# Patient Record
Sex: Female | Born: 1965 | Race: Black or African American | Hispanic: No | Marital: Single | Smoking: Never smoker
Health system: Southern US, Community
[De-identification: ages and names within clinical notes are randomized; demographics above are authoritative.]

## PROBLEM LIST (undated history)

## (undated) DIAGNOSIS — I10 Essential (primary) hypertension: Secondary | ICD-10-CM

## (undated) DIAGNOSIS — R7303 Prediabetes: Secondary | ICD-10-CM

## (undated) DIAGNOSIS — F419 Anxiety disorder, unspecified: Secondary | ICD-10-CM

## (undated) HISTORY — PX: TUBAL LIGATION: SHX77

## (undated) HISTORY — PX: HAMMER TOE SURGERY: SHX385

## (undated) HISTORY — PX: ABLATION: SHX5711

---

## 1998-04-25 ENCOUNTER — Emergency Department (HOSPITAL_COMMUNITY): Admission: EM | Admit: 1998-04-25 | Discharge: 1998-04-25 | Payer: Self-pay | Admitting: Emergency Medicine

## 1999-12-27 ENCOUNTER — Ambulatory Visit (HOSPITAL_COMMUNITY): Admission: RE | Admit: 1999-12-27 | Discharge: 1999-12-27 | Payer: Self-pay | Admitting: Obstetrics and Gynecology

## 2000-02-10 ENCOUNTER — Encounter: Payer: Self-pay | Admitting: Internal Medicine

## 2000-02-10 ENCOUNTER — Encounter: Admission: RE | Admit: 2000-02-10 | Discharge: 2000-02-10 | Payer: Self-pay | Admitting: Internal Medicine

## 2001-04-07 ENCOUNTER — Encounter: Payer: Self-pay | Admitting: Internal Medicine

## 2001-04-07 ENCOUNTER — Encounter: Admission: RE | Admit: 2001-04-07 | Discharge: 2001-04-07 | Payer: Self-pay | Admitting: Internal Medicine

## 2001-04-21 ENCOUNTER — Other Ambulatory Visit: Admission: RE | Admit: 2001-04-21 | Discharge: 2001-04-21 | Payer: Self-pay | Admitting: Obstetrics and Gynecology

## 2001-04-27 ENCOUNTER — Encounter: Payer: Self-pay | Admitting: Internal Medicine

## 2001-04-27 ENCOUNTER — Encounter: Admission: RE | Admit: 2001-04-27 | Discharge: 2001-04-27 | Payer: Self-pay | Admitting: Internal Medicine

## 2002-11-03 ENCOUNTER — Other Ambulatory Visit: Admission: RE | Admit: 2002-11-03 | Discharge: 2002-11-03 | Payer: Self-pay | Admitting: Obstetrics and Gynecology

## 2004-07-15 ENCOUNTER — Encounter: Admission: RE | Admit: 2004-07-15 | Discharge: 2004-07-15 | Payer: Self-pay | Admitting: Internal Medicine

## 2004-11-04 ENCOUNTER — Encounter: Admission: RE | Admit: 2004-11-04 | Discharge: 2004-11-04 | Payer: Self-pay | Admitting: Internal Medicine

## 2004-11-14 ENCOUNTER — Encounter: Admission: RE | Admit: 2004-11-14 | Discharge: 2004-11-14 | Payer: Self-pay | Admitting: Internal Medicine

## 2006-07-29 ENCOUNTER — Encounter: Admission: RE | Admit: 2006-07-29 | Discharge: 2006-07-29 | Payer: Self-pay | Admitting: Internal Medicine

## 2008-10-06 ENCOUNTER — Emergency Department (HOSPITAL_COMMUNITY): Admission: EM | Admit: 2008-10-06 | Discharge: 2008-10-06 | Payer: Self-pay | Admitting: Emergency Medicine

## 2009-09-27 ENCOUNTER — Encounter: Admission: RE | Admit: 2009-09-27 | Discharge: 2009-09-27 | Payer: Self-pay | Admitting: Internal Medicine

## 2010-02-10 ENCOUNTER — Encounter: Payer: Self-pay | Admitting: Internal Medicine

## 2010-02-14 ENCOUNTER — Other Ambulatory Visit: Payer: Self-pay | Admitting: Internal Medicine

## 2010-02-14 DIAGNOSIS — Z1239 Encounter for other screening for malignant neoplasm of breast: Secondary | ICD-10-CM

## 2010-02-20 ENCOUNTER — Ambulatory Visit
Admission: RE | Admit: 2010-02-20 | Discharge: 2010-02-20 | Disposition: A | Discharge status: Home / Self Care | Payer: BC Managed Care – PPO | Source: Ambulatory Visit | Attending: Internal Medicine | Admitting: Internal Medicine

## 2010-02-20 DIAGNOSIS — Z1239 Encounter for other screening for malignant neoplasm of breast: Secondary | ICD-10-CM

## 2010-06-07 NOTE — Op Note (Signed)
[facility]  Patient:    Alexandra [name], Alexandra [name]                       MRN: [id number] Proc. Date: 12/27/99 Attending:  Janeece Riggers. Dareen Piano, M.D.                           Operative Report  PREOPERATIVE DIAGNOSIS:       The patient desires permanent sterilization.  POSTOPERATIVE DIAGNOSIS:      The patient desires permanent sterilization.  OPERATION:                    Bilateral tubal ligation, application of Hulka                               clips.  SURGEON:                      Mark E. Dareen Piano, M.D.  ASSISTANT:  ANESTHESIA:                   General endotracheal anesthesia.  ANTIBIOTICS:                  None.  DRAINS:                       Red rubber catheter to bladder.  ESTIMATED BLOOD LOSS:         Minimal.  COMPLICATIONS:                None.  SPECIMENS:                    None.  FINDINGS:                     The patient had normal-appearing fallopian tubes, ovaries and uterus.  DESCRIPTION OF PROCEDURE:     The patient was taken to the operating room where she was placed in the dorsal supine position. A general endotracheal anesthetic was administered without any complications.  The patient was placed in the dorsal lithotomy position and prepped with Hibiclens.  Her bladder was drained with a red rubber catheter.  Sterile speculum was placed in the vagina.  A Hulka tenaculum was applied to the anterior cervical lip.  The patient was then draped in the usual fashion for this procedure.  I injected 10 cc of 0.25% Marcaine into the umbilical region.  A vertical skin incision was made in the umbilicus and the Verres needle was placed in the peritoneal cavity.  Three liters of carbon dioxide were insufflated into the peritoneal cavity.  The 12 mm trocar was then placed in the peritoneal cavity.  The scope was then placed.  At this point, a Hulka clip was applied to the right fallopian tube in the isthmic portion. It appeared that the entire  tube was within the clasp.  The clasp appeared to be tightly closed.   The clip appeared to be perpendicular to the tube.  A similar procedure was performed on the opposite side.  At this point the scope was removed.  The pneumoperitoneum released. The trocar sheath removed.  The fascia was closed with interrupted 0 Vicryl suture, the skin incision was closed with 4-0 Vicryl in subcuticular fashion.  The patient tolerated the procedure well.  She will be  discharged to home.  She will be sent home with Tylox to take p.r.n.  She will follow up in the office in four weeks. DD:  12/27/99 TD:  12/27/99 Job: [id number]

## 2010-08-06 ENCOUNTER — Inpatient Hospital Stay (INDEPENDENT_AMBULATORY_CARE_PROVIDER_SITE_OTHER)
Admission: RE | Admit: 2010-08-06 | Discharge: 2010-08-06 | Disposition: A | Discharge status: Home / Self Care | Payer: BC Managed Care – PPO | Source: Ambulatory Visit | Attending: Emergency Medicine | Admitting: Emergency Medicine

## 2010-08-06 DIAGNOSIS — J029 Acute pharyngitis, unspecified: Secondary | ICD-10-CM

## 2010-08-06 LAB — POCT RAPID STREP A: Streptococcus, Group A Screen (Direct): NEGATIVE

## 2010-08-09 LAB — CHLAMYDIA CULTURE

## 2010-08-09 LAB — GONOCOCCUS CULTURE: Culture: NO GROWTH

## 2010-08-12 ENCOUNTER — Emergency Department (HOSPITAL_COMMUNITY)
Admission: EM | Admit: 2010-08-12 | Discharge: 2010-08-12 | Disposition: A | Discharge status: Home / Self Care | Payer: BC Managed Care – PPO | Attending: Emergency Medicine | Admitting: Emergency Medicine

## 2010-08-12 DIAGNOSIS — I10 Essential (primary) hypertension: Secondary | ICD-10-CM | POA: Insufficient documentation

## 2010-08-12 DIAGNOSIS — Z794 Long term (current) use of insulin: Secondary | ICD-10-CM | POA: Insufficient documentation

## 2010-08-12 DIAGNOSIS — F410 Panic disorder [episodic paroxysmal anxiety] without agoraphobia: Secondary | ICD-10-CM | POA: Insufficient documentation

## 2010-08-12 DIAGNOSIS — E119 Type 2 diabetes mellitus without complications: Secondary | ICD-10-CM | POA: Insufficient documentation

## 2010-08-15 LAB — MISCELLANEOUS TEST

## 2010-09-30 LAB — VIRUS CULTURE

## 2010-11-14 ENCOUNTER — Other Ambulatory Visit: Payer: Self-pay | Admitting: Internal Medicine

## 2010-11-14 DIAGNOSIS — N644 Mastodynia: Secondary | ICD-10-CM

## 2010-11-27 ENCOUNTER — Ambulatory Visit
Admission: RE | Admit: 2010-11-27 | Discharge: 2010-11-27 | Disposition: A | Discharge status: Home / Self Care | Payer: BC Managed Care – PPO | Source: Ambulatory Visit | Attending: Internal Medicine | Admitting: Internal Medicine

## 2010-11-27 DIAGNOSIS — N644 Mastodynia: Secondary | ICD-10-CM

## 2011-02-24 ENCOUNTER — Other Ambulatory Visit: Payer: Self-pay

## 2011-02-24 ENCOUNTER — Emergency Department (HOSPITAL_COMMUNITY)
Admission: EM | Admit: 2011-02-24 | Discharge: 2011-02-24 | Disposition: A | Discharge status: Home / Self Care | Payer: BC Managed Care – PPO | Attending: Emergency Medicine | Admitting: Emergency Medicine

## 2011-02-24 ENCOUNTER — Encounter (HOSPITAL_COMMUNITY): Payer: Self-pay | Admitting: Emergency Medicine

## 2011-02-24 DIAGNOSIS — F419 Anxiety disorder, unspecified: Secondary | ICD-10-CM

## 2011-02-24 DIAGNOSIS — F411 Generalized anxiety disorder: Secondary | ICD-10-CM | POA: Insufficient documentation

## 2011-02-24 DIAGNOSIS — Z79899 Other long term (current) drug therapy: Secondary | ICD-10-CM | POA: Insufficient documentation

## 2011-02-24 HISTORY — DX: Anxiety disorder, unspecified: F41.9

## 2011-02-24 HISTORY — DX: Essential (primary) hypertension: I10

## 2011-02-24 MED ORDER — LORAZEPAM 1 MG PO TABS: 1.0000 mg | ORAL_TABLET | Freq: Three times a day (TID) | ORAL | Status: AC | PRN

## 2011-02-24 MED ORDER — LORAZEPAM 1 MG PO TABS
1.0000 mg | ORAL_TABLET | Freq: Once | ORAL | Status: AC
  Administered 2011-02-24: 1 mg via ORAL
  Filled 2011-02-24 (×2): qty 1

## 2011-02-24 NOTE — ED Provider Notes (Signed)
History     CSN: [id number]  Arrival date & time 02/24/11  1330   First MD Initiated Contact with Patient 02/24/11 1517      Chief Complaint  Patient presents with  . Anxiety    (Consider location/radiation/quality/duration/timing/severity/associated sxs/prior treatment) HPI Comments: Patient has increasing stress in her life. Had some palpitations and some tingling. Has a history of similar. Prescribed Ativan in the past.  Patient is a 46 y.o. female presenting with anxiety. The history is provided by the patient. No language interpreter was used.  Anxiety This is a new problem. The current episode started 3 to 5 hours ago. The problem occurs constantly. The problem has been gradually improving. Pertinent negatives include no chest pain, no abdominal pain, no headaches and no shortness of breath. The symptoms are aggravated by stress. The symptoms are relieved by nothing. She has tried nothing for the symptoms. The treatment provided mild relief.    Past Medical History  Diagnosis Date  . Hypertension   . Anxiety     History reviewed. No pertinent past surgical history.  No family history on file.  History  Substance Use Topics  . Smoking status: Not on file  . Smokeless tobacco: Not on file  . Alcohol Use:     OB History    Grav Para Term Preterm Abortions TAB SAB Ect Mult Living                  Review of Systems  Constitutional: Negative for fever, activity change, appetite change and fatigue.  HENT: Negative for congestion, sore throat, rhinorrhea, neck pain and neck stiffness.   Respiratory: Negative for cough and shortness of breath.   Cardiovascular: Negative for chest pain and palpitations.  Gastrointestinal: Negative for nausea, vomiting and abdominal pain.  Genitourinary: Negative for dysuria, urgency, frequency and flank pain.  Neurological: Negative for dizziness, weakness, light-headedness, numbness and headaches.  Psychiatric/Behavioral: The patient  is nervous/anxious.   All other systems reviewed and are negative.    Allergies  Review of patient's allergies indicates no known allergies.  Home Medications   Current Outpatient Rx  Name Route Sig Dispense Refill  . LORAZEPAM 1 MG PO TABS Oral Take 1 tablet (1 mg total) by mouth 3 (three) times daily as needed for anxiety. 15 tablet 0    BP 155/77  Pulse 92  Temp(Src) 98.7 F (37.1 C) (Oral)  Resp 18  SpO2 98%  Physical Exam  Nursing note and vitals reviewed. Constitutional: She is oriented to person, place, and time. She appears well-developed and well-nourished. No distress.  HENT:  Head: Normocephalic and atraumatic.  Mouth/Throat: Oropharynx is clear and moist. No oropharyngeal exudate.  Eyes: Conjunctivae and EOM are normal. Pupils are equal, round, and reactive to light.  Neck: Normal range of motion. Neck supple.  Cardiovascular: Normal rate, regular rhythm, normal heart sounds and intact distal pulses.  Exam reveals no gallop and no friction rub.   No murmur heard. Pulmonary/Chest: Effort normal and breath sounds normal. No respiratory distress.  Abdominal: Soft. Bowel sounds are normal. There is no tenderness.  Musculoskeletal: Normal range of motion. She exhibits no tenderness.  Neurological: She is alert and oriented to person, place, and time. No cranial nerve deficit.  Skin: Skin is warm and dry.  Psychiatric: Her mood appears anxious. She does not exhibit a depressed mood. She expresses no homicidal and no suicidal ideation. She expresses no suicidal plans and no homicidal plans.    ED Course  Procedures (including critical care time)   Date: 02/24/2011  Rate: 65  Rhythm: normal sinus rhythm  QRS Axis: normal  Intervals: normal  ST/T Wave abnormalities: normal  Conduction Disutrbances:none  Narrative Interpretation:   Old EKG Reviewed: none available  Labs Reviewed - No data to display No results found.   1. Anxiety       MDM  Anxiety  with panic-like symptoms. I have no concern about cardiac etiology of her symptoms. EKG is unremarkable. Symptoms have since resolved. I patient has had similar symptoms in the past which prescribe Ativan. Currently taking Celexa. She has a Veterinary surgeon. She'll be discharged home with instructions to continue taking Celexa and short Rx Ativan.  Patient is not a threat to herself or others        Dayton Bailiff, MD 02/24/11 1547

## 2011-02-24 NOTE — ED Notes (Signed)
Per pt, went to work felt dizzy/anxious laid down on floor-symptoms worsened

## 2011-02-24 NOTE — ED Notes (Signed)
Panic attack 15 min prior to EMS arrival. Stress related work and family life. Prescribed Ativan and Celexa has not taken today.

## 2011-11-14 ENCOUNTER — Other Ambulatory Visit: Payer: Self-pay | Admitting: Internal Medicine

## 2011-11-19 ENCOUNTER — Other Ambulatory Visit: Payer: Self-pay | Admitting: Internal Medicine

## 2011-11-19 DIAGNOSIS — N644 Mastodynia: Secondary | ICD-10-CM

## 2011-12-02 ENCOUNTER — Ambulatory Visit
Admission: RE | Admit: 2011-12-02 | Discharge: 2011-12-02 | Disposition: A | Discharge status: Home / Self Care | Payer: BC Managed Care – PPO | Source: Ambulatory Visit | Attending: Internal Medicine | Admitting: Internal Medicine

## 2011-12-02 DIAGNOSIS — N644 Mastodynia: Secondary | ICD-10-CM

## 2012-12-17 ENCOUNTER — Other Ambulatory Visit: Payer: Self-pay

## 2012-12-17 DIAGNOSIS — Z1231 Encounter for screening mammogram for malignant neoplasm of breast: Secondary | ICD-10-CM

## 2013-01-26 ENCOUNTER — Ambulatory Visit
Admission: RE | Admit: 2013-01-26 | Discharge: 2013-01-26 | Disposition: A | Discharge status: Home / Self Care | Payer: BC Managed Care – PPO | Source: Ambulatory Visit

## 2013-01-26 DIAGNOSIS — Z1231 Encounter for screening mammogram for malignant neoplasm of breast: Secondary | ICD-10-CM

## 2013-02-01 ENCOUNTER — Other Ambulatory Visit: Payer: Self-pay | Admitting: Internal Medicine

## 2013-02-01 DIAGNOSIS — R928 Other abnormal and inconclusive findings on diagnostic imaging of breast: Secondary | ICD-10-CM

## 2013-02-11 ENCOUNTER — Ambulatory Visit
Admission: RE | Admit: 2013-02-11 | Discharge: 2013-02-11 | Disposition: A | Discharge status: Home / Self Care | Payer: BC Managed Care – PPO | Source: Ambulatory Visit | Attending: Internal Medicine | Admitting: Internal Medicine

## 2013-02-11 DIAGNOSIS — R928 Other abnormal and inconclusive findings on diagnostic imaging of breast: Secondary | ICD-10-CM

## 2014-07-17 ENCOUNTER — Other Ambulatory Visit: Payer: Self-pay | Admitting: Obstetrics and Gynecology

## 2014-07-17 DIAGNOSIS — R928 Other abnormal and inconclusive findings on diagnostic imaging of breast: Secondary | ICD-10-CM

## 2014-07-19 ENCOUNTER — Other Ambulatory Visit: Payer: Self-pay

## 2014-07-20 ENCOUNTER — Ambulatory Visit
Admission: RE | Admit: 2014-07-20 | Discharge: 2014-07-20 | Disposition: A | Discharge status: Home / Self Care | Payer: 59 | Source: Ambulatory Visit | Attending: Obstetrics and Gynecology | Admitting: Obstetrics and Gynecology

## 2014-07-20 DIAGNOSIS — R928 Other abnormal and inconclusive findings on diagnostic imaging of breast: Secondary | ICD-10-CM

## 2015-02-16 ENCOUNTER — Other Ambulatory Visit: Payer: Self-pay

## 2015-02-16 DIAGNOSIS — Z1231 Encounter for screening mammogram for malignant neoplasm of breast: Secondary | ICD-10-CM

## 2015-03-05 ENCOUNTER — Ambulatory Visit
Admission: RE | Admit: 2015-03-05 | Discharge: 2015-03-05 | Disposition: A | Discharge status: Home / Self Care | Payer: BLUE CROSS/BLUE SHIELD | Source: Ambulatory Visit

## 2015-03-05 DIAGNOSIS — Z1231 Encounter for screening mammogram for malignant neoplasm of breast: Secondary | ICD-10-CM

## 2015-07-10 ENCOUNTER — Other Ambulatory Visit: Payer: Self-pay | Admitting: Obstetrics and Gynecology

## 2015-07-10 DIAGNOSIS — Z1231 Encounter for screening mammogram for malignant neoplasm of breast: Secondary | ICD-10-CM

## 2015-07-30 ENCOUNTER — Ambulatory Visit
Admission: RE | Admit: 2015-07-30 | Discharge: 2015-07-30 | Disposition: A | Discharge status: Home / Self Care | Payer: BLUE CROSS/BLUE SHIELD | Source: Ambulatory Visit | Attending: Obstetrics and Gynecology | Admitting: Obstetrics and Gynecology

## 2015-07-30 DIAGNOSIS — Z1231 Encounter for screening mammogram for malignant neoplasm of breast: Secondary | ICD-10-CM

## 2016-06-20 ENCOUNTER — Other Ambulatory Visit: Payer: Self-pay | Admitting: Gastroenterology

## 2016-06-20 DIAGNOSIS — R110 Nausea: Secondary | ICD-10-CM

## 2016-06-20 DIAGNOSIS — R1013 Epigastric pain: Secondary | ICD-10-CM

## 2016-06-26 ENCOUNTER — Ambulatory Visit (HOSPITAL_COMMUNITY)
Admission: RE | Admit: 2016-06-26 | Discharge: 2016-06-26 | Disposition: A | Discharge status: Home / Self Care | Payer: BLUE CROSS/BLUE SHIELD | Source: Ambulatory Visit | Attending: Gastroenterology | Admitting: Gastroenterology

## 2016-06-26 DIAGNOSIS — R1013 Epigastric pain: Secondary | ICD-10-CM

## 2016-06-26 DIAGNOSIS — R110 Nausea: Secondary | ICD-10-CM

## 2016-07-09 ENCOUNTER — Ambulatory Visit (HOSPITAL_COMMUNITY): Payer: BLUE CROSS/BLUE SHIELD

## 2016-09-11 ENCOUNTER — Other Ambulatory Visit: Payer: Self-pay | Admitting: Obstetrics and Gynecology

## 2016-09-11 DIAGNOSIS — Z1231 Encounter for screening mammogram for malignant neoplasm of breast: Secondary | ICD-10-CM

## 2016-09-11 DIAGNOSIS — R5381 Other malaise: Secondary | ICD-10-CM

## 2016-09-19 ENCOUNTER — Other Ambulatory Visit: Payer: Self-pay | Admitting: Obstetrics and Gynecology

## 2016-09-19 DIAGNOSIS — N644 Mastodynia: Secondary | ICD-10-CM

## 2016-10-13 ENCOUNTER — Other Ambulatory Visit: Payer: BLUE CROSS/BLUE SHIELD

## 2016-10-14 ENCOUNTER — Ambulatory Visit
Admission: RE | Admit: 2016-10-14 | Discharge: 2016-10-14 | Disposition: A | Discharge status: Home / Self Care | Payer: BLUE CROSS/BLUE SHIELD | Source: Ambulatory Visit | Attending: Obstetrics and Gynecology | Admitting: Obstetrics and Gynecology

## 2016-10-14 DIAGNOSIS — N644 Mastodynia: Secondary | ICD-10-CM

## 2017-10-23 ENCOUNTER — Encounter: Payer: Self-pay | Admitting: Nurse Practitioner

## 2017-10-23 ENCOUNTER — Ambulatory Visit: Payer: BLUE CROSS/BLUE SHIELD | Admitting: Nurse Practitioner

## 2017-10-23 VITALS — BP 136/94 | HR 63 | Temp 98.1°F | Ht 67.0 in | Wt 197.6 lb

## 2017-10-23 DIAGNOSIS — R7303 Prediabetes: Secondary | ICD-10-CM

## 2017-10-23 DIAGNOSIS — Z23 Encounter for immunization: Secondary | ICD-10-CM

## 2017-10-23 DIAGNOSIS — G4700 Insomnia, unspecified: Secondary | ICD-10-CM

## 2017-10-23 DIAGNOSIS — I10 Essential (primary) hypertension: Secondary | ICD-10-CM | POA: Diagnosis not present

## 2017-10-23 DIAGNOSIS — R635 Abnormal weight gain: Secondary | ICD-10-CM

## 2017-10-23 MED ORDER — LIRAGLUTIDE 18 MG/3ML ~~LOC~~ SOPN: 1.2000 mg | PEN_INJECTOR | Freq: Every day | SUBCUTANEOUS | 1 refills | Status: DC

## 2017-10-23 MED ORDER — ESZOPICLONE 1 MG PO TABS: 1.0000 mg | ORAL_TABLET | Freq: Every day | ORAL | 2 refills | Status: DC

## 2017-10-23 MED ORDER — OLMESARTAN MEDOXOMIL-HCTZ 20-12.5 MG PO TABS: 0.5000 | ORAL_TABLET | Freq: Every day | ORAL | 1 refills | Status: DC

## 2017-10-23 MED ORDER — INSULIN PEN NEEDLE 32G X 4 MM MISC: 1.0000 "pen " | Freq: Every day | 3 refills | Status: DC

## 2017-10-23 NOTE — Progress Notes (Signed)
Subjective:     Patient ID: Alexandra [name] , female    DOB: [birth year] , 52 y.o.   MRN: [id number]   Hypertension  This is a chronic problem. The current episode started more than 1 year ago. The problem has been gradually worsening since onset. The problem is uncontrolled. Associated symptoms include headaches. Pertinent negatives include no anxiety, blurred vision or chest pain. There are no associated agents to hypertension. Risk factors for coronary artery disease include obesity. Treatments tried: Has been without her blood pressure medications in the last 3 months due to no insurance.  Diabetes  She presents for her follow-up diabetic visit. She has type 2 diabetes mellitus. No MedicAlert identification noted. Her disease course has been stable. Hypoglycemia symptoms include headaches. Associated symptoms include polydipsia. Pertinent negatives for diabetes include no blurred vision, no chest pain, no polyphagia and no polyuria. Risk factors for coronary artery disease include hypertension and diabetes mellitus. Current diabetic treatments: Was on Victoza until May, no insurance since then. When asked about meal planning, she reported none. She has not had a previous visit with a dietitian. An ACE inhibitor/angiotensin II receptor blocker is being taken. She does not see a podiatrist.Eye exam is not current.  Insomnia  Primary symptoms: sleep disturbance, frequent awakening.  The current episode started more than one year. The onset quality is undetermined. The problem occurs every several days. Past treatments include alcohol.     Past Medical History:  Diagnosis Date  . Anxiety   . Hypertension       Current Outpatient Medications:  .  cholecalciferol (VITAMIN D) 1000 UNITS tablet, Take 1,000 Units by mouth daily., Disp: , Rfl:  .  Insulin Pen Needle (NOVOFINE PLUS) 32G X 4 MM MISC, by Does not apply route. Use as directed, Disp: , Rfl:  .  Liraglutide (VICTOZA Perry), Inject 1.2 mg  into the skin daily., Disp: , Rfl:  .  LORazepam (ATIVAN) 0.5 MG tablet, Take 0.5 mg by mouth 2 (two) times daily as needed. For anxiety, Disp: , Rfl:  .  olmesartan-hydrochlorothiazide (BENICAR HCT) 20-12.5 MG per tablet, Take 0.5 tablets by mouth daily., Disp: , Rfl:  .  valACYclovir (VALTREX) 1000 MG tablet, Take 1,000 mg by mouth daily., Disp: , Rfl:    Review of Systems  Constitutional: Negative.   Eyes: Negative for blurred vision.  Respiratory: Negative.   Cardiovascular: Negative.  Negative for chest pain.  Endocrine: Positive for polydipsia. Negative for polyphagia and polyuria.  Musculoskeletal: Negative.   Skin: Negative.   Neurological: Positive for headaches.  Psychiatric/Behavioral: Positive for sleep disturbance. The patient has insomnia.      Today's Vitals   10/23/17 1600  BP: (!) 136/94  Pulse: 63  Temp: 98.1 F (36.7 C)  TempSrc: Oral  SpO2: 98%  Weight: 197 lb 9.6 oz (89.6 kg)  Height: 5' 7"  (1.702 m)  PainSc: 0-No pain   Body mass index is 30.95 kg/m.   Objective:  Physical Exam  Constitutional: She is oriented to person, place, and time. She appears well-developed and well-nourished.  Cardiovascular: Normal rate, regular rhythm, normal heart sounds and intact distal pulses.  Pulmonary/Chest: Effort normal and breath sounds normal.  Musculoskeletal: Normal range of motion.  Neurological: She is alert and oriented to person, place, and time.  Skin: Skin is warm and dry. Capillary refill takes less than 2 seconds.        Assessment And Plan:      1. Essential hypertension  Chronic,  slightly elevated today. She has been without her medication for approximately 3-4 months  Will restart her benazapril/HCTZ - CBC no Diff - BMP8+eGFR - Lipid Profile  2. Prediabetes  Chronic. Was taking Victoza, none in last 3-4 months due to no insurance coverage.   Advised to increase slowly by 5 clicks per week - Hemoglobin A1c - Lipid Profile  3.  Insomnia, unspecified type  Will restart Lunesta  4. Abnormal weight gain  She has gained 22lbs since her last visit  Will check for metabolic causes  She had been without her Victoza for 3-4 months which has a side effect of weight loss.  Continue exercising - TSH - T3, free - T4  5. Need for influenza vaccination  Influenza vaccine given in office  Encouraged to take Tylenol as needed for myalgias/fever - Flu Vaccine QUAD 6+ mos PF IM (Fluarix Quad PF)      [name], FNP

## 2017-10-23 NOTE — Patient Instructions (Signed)

## 2017-10-24 LAB — CBC
Hematocrit: 35.6 % (ref 34.0–46.6)
Hemoglobin: 11.9 g/dL (ref 11.1–15.9)
MCH: 29.2 pg (ref 26.6–33.0)
MCHC: 33.4 g/dL (ref 31.5–35.7)
MCV: 87 fL (ref 79–97)
Platelets: 226 10*3/uL (ref 150–450)
RBC: 4.08 x10E6/uL (ref 3.77–5.28)
RDW: 14.3 % (ref 12.3–15.4)
WBC: 7.6 10*3/uL (ref 3.4–10.8)

## 2017-10-24 LAB — BMP8+EGFR
BUN/Creatinine Ratio: 23 (ref 9–23)
BUN: 22 mg/dL (ref 6–24)
CO2: 23 mmol/L (ref 20–29)
Calcium: 9.2 mg/dL (ref 8.7–10.2)
Chloride: 106 mmol/L (ref 96–106)
Creatinine, Ser: 0.94 mg/dL (ref 0.57–1.00)
GFR calc Af Amer: 80 mL/min/{1.73_m2} (ref >59)
GFR calc non Af Amer: 69 mL/min/{1.73_m2} (ref >59)
Glucose: 91 mg/dL (ref 65–99)
Potassium: 4.2 mmol/L (ref 3.5–5.2)
Sodium: 146 mmol/L — ABNORMAL HIGH (ref 134–144)

## 2017-10-24 LAB — HEMOGLOBIN A1C
Est. average glucose Bld gHb Est-mCnc: 117 mg/dL
Hgb A1c MFr Bld: 5.7 % — ABNORMAL HIGH (ref 4.8–5.6)

## 2017-10-24 LAB — LIPID PANEL
Chol/HDL Ratio: 3 ratio (ref 0.0–4.4)
Cholesterol, Total: 198 mg/dL (ref 100–199)
HDL: 65 mg/dL (ref >39)
LDL Calculated: 112 mg/dL — ABNORMAL HIGH (ref 0–99)
Triglycerides: 103 mg/dL (ref 0–149)
VLDL Cholesterol Cal: 21 mg/dL (ref 5–40)

## 2017-10-29 ENCOUNTER — Other Ambulatory Visit: Payer: Self-pay | Admitting: Nurse Practitioner

## 2017-10-29 DIAGNOSIS — R7303 Prediabetes: Secondary | ICD-10-CM

## 2017-10-29 MED ORDER — SEMAGLUTIDE(0.25 OR 0.5MG/DOS) 2 MG/1.5ML ~~LOC~~ SOPN: 0.5000 mg | PEN_INJECTOR | SUBCUTANEOUS | 3 refills | Status: DC

## 2017-12-30 ENCOUNTER — Other Ambulatory Visit: Payer: Self-pay | Admitting: Nurse Practitioner

## 2018-01-22 ENCOUNTER — Encounter: Payer: Self-pay | Admitting: Nurse Practitioner

## 2018-01-22 ENCOUNTER — Ambulatory Visit (INDEPENDENT_AMBULATORY_CARE_PROVIDER_SITE_OTHER): Payer: BLUE CROSS/BLUE SHIELD | Admitting: Nurse Practitioner

## 2018-01-22 VITALS — BP 110/72 | HR 69 | Temp 98.2°F | Ht 67.0 in | Wt 192.6 lb

## 2018-01-22 DIAGNOSIS — Z1211 Encounter for screening for malignant neoplasm of colon: Secondary | ICD-10-CM | POA: Diagnosis not present

## 2018-01-22 DIAGNOSIS — I10 Essential (primary) hypertension: Secondary | ICD-10-CM

## 2018-01-22 DIAGNOSIS — M25512 Pain in left shoulder: Secondary | ICD-10-CM

## 2018-01-22 DIAGNOSIS — G8929 Other chronic pain: Secondary | ICD-10-CM

## 2018-01-22 DIAGNOSIS — Z1231 Encounter for screening mammogram for malignant neoplasm of breast: Secondary | ICD-10-CM | POA: Diagnosis not present

## 2018-01-22 DIAGNOSIS — R7303 Prediabetes: Secondary | ICD-10-CM

## 2018-01-22 DIAGNOSIS — Z0000 Encounter for general adult medical examination without abnormal findings: Secondary | ICD-10-CM

## 2018-01-22 DIAGNOSIS — M25511 Pain in right shoulder: Secondary | ICD-10-CM

## 2018-01-22 LAB — POCT URINALYSIS DIPSTICK
Bilirubin, UA: NEGATIVE
Glucose, UA: NEGATIVE
Ketones, UA: NEGATIVE
Leukocytes, UA: NEGATIVE
Nitrite, UA: NEGATIVE
Protein, UA: NEGATIVE
Spec Grav, UA: 1.015 (ref 1.010–1.025)
Urobilinogen, UA: 0.2 E.U./dL
pH, UA: 6.5 (ref 5.0–8.0)

## 2018-01-22 LAB — POCT UA - MICROALBUMIN
Creatinine, POC: 100 mg/dL
Microalbumin Ur, POC: 80 mg/L

## 2018-01-22 NOTE — Patient Instructions (Signed)

## 2018-01-22 NOTE — Progress Notes (Signed)
Subjective:     Patient ID: Alexandra [name] , female    DOB: Jun 08, 1965 , 53 y.o.   MRN: [id number]   Chief Complaint  Patient presents with  . Annual Exam   The patient states she uses post menopausal status for birth control. Last LMP was Patient's last menstrual period was 07/09/2015.. Negative for Dysmenorrhea and Negative for Menorrhagia Mammogram last done 10/14/16 Negative for: breast discharge, breast lump(s), breast pain and breast self exam.  Pertinent negatives include abnormal bleeding (hematology), anxiety, decreased libido, depression, difficulty falling sleep, dyspareunia, history of infertility, nocturia, sexual dysfunction, sleep disturbances, urinary incontinence, urinary urgency, vaginal discharge and vaginal itching. Diet regular, avoiding fatty foods.The patient states her exercise level is  none.    . The patient's tobacco use is:  Social History   Tobacco Use  Smoking Status Never Smoker  Smokeless Tobacco Never Used  . She has been exposed to passive smoke. The patient's alcohol use is:  Social History   Substance and Sexual Activity  Alcohol Use Yes  . Additional information: Last pap November 2019 (Dr. Orene Desanctis), next one scheduled for 2022   HPI  Here for HM  Diabetes  She presents for her follow-up diabetic visit. She has type 2 diabetes mellitus. Her disease course has been stable. There are no hypoglycemic associated symptoms. Pertinent negatives for hypoglycemia include no dizziness or headaches. Pertinent negatives for diabetes include no blurred vision, no polydipsia, no polyphagia and no polyuria. There are no hypoglycemic complications. There are no diabetic complications. Risk factors for coronary artery disease include diabetes mellitus, obesity and sedentary lifestyle. Current diabetic treatment includes oral agent (monotherapy). She is compliant with treatment all of the time. When asked about meal planning, she reported none. She has not had a previous  visit with a dietitian. An ACE inhibitor/angiotensin II receptor blocker is being taken. She does not see a podiatrist.Eye exam is current.     Past Medical History:  Diagnosis Date  . Anxiety   . Hypertension      Family History  Problem Relation Age of Onset  . Breast cancer Maternal Grandmother      Current Outpatient Medications:  .  benazepril-hydrochlorthiazide (LOTENSIN HCT) 20-12.5 MG tablet, TAKE 1 TABLET BY MOUTH EVERY DAY, Disp: 30 tablet, Rfl: 2 .  cholecalciferol (VITAMIN D) 1000 UNITS tablet, Take 1,000 Units by mouth daily., Disp: , Rfl:  .  eszopiclone (LUNESTA) 1 MG TABS tablet, Take 1 tablet (1 mg total) by mouth at bedtime. Take immediately before bedtime, Disp: 30 tablet, Rfl: 2 .  Insulin Pen Needle (NOVOFINE PLUS) 32G X 4 MM MISC, Inject 1 pen into the skin daily. Inject 1 pen with Victoza SQ, Disp: 100 each, Rfl: 3 .  Semaglutide,0.25 or 0.5MG /DOS, (OZEMPIC, 0.25 OR 0.5 MG/DOSE,) 2 MG/1.5ML SOPN, Inject 0.5 mg into the skin once a week. Inject 0.25 mg weekly to skin for 4 weeks then increase to 0.5 mg weekly to skin, Disp: 1 pen, Rfl: 3 .  valACYclovir (VALTREX) 1000 MG tablet, Take 1,000 mg by mouth daily. Patient taking it as needed, Disp: , Rfl:    No Known Allergies   Review of Systems  Constitutional: Negative.   HENT: Negative.   Eyes: Negative.  Negative for blurred vision.  Respiratory: Negative.   Cardiovascular: Negative.   Gastrointestinal: Negative.   Endocrine: Negative.  Negative for polydipsia, polyphagia and polyuria.  Genitourinary: Negative.   Musculoskeletal: Negative.   Skin: Negative.   Allergic/Immunologic: Negative.  Neurological: Negative.  Negative for dizziness and headaches.  Hematological: Negative.   Psychiatric/Behavioral: Negative.      Today's Vitals   01/22/18 0905  BP: 110/72  Pulse: 69  Temp: 98.2 F (36.8 C)  SpO2: 96%  Weight: 192 lb 9.6 oz (87.4 kg)  Height: 5\' 7"  (1.702 m)  PainSc: 0-No pain   Body  mass index is 30.17 kg/m.   Objective:  Physical Exam Constitutional:      Appearance: Normal appearance. She is well-developed.  HENT:     Head: Normocephalic and atraumatic.     Right Ear: Hearing, tympanic membrane, ear canal and external ear normal.     Left Ear: Hearing, tympanic membrane, ear canal and external ear normal.     Nose: Nose normal.     Mouth/Throat:     Mouth: Mucous membranes are moist.  Eyes:     General: Lids are normal.     Extraocular Movements: Extraocular movements intact.     Conjunctiva/sclera: Conjunctivae normal.     Pupils: Pupils are equal, round, and reactive to light.     Funduscopic exam:    Right eye: No papilledema.        Left eye: No papilledema.  Neck:     Musculoskeletal: Full passive range of motion without pain, normal range of motion and neck supple.     Thyroid: No thyroid mass.     Vascular: No carotid bruit.  Cardiovascular:     Rate and Rhythm: Normal rate and regular rhythm.     Pulses: Normal pulses.     Heart sounds: Normal heart sounds. No murmur.  Pulmonary:     Effort: Pulmonary effort is normal.     Breath sounds: Normal breath sounds.  Abdominal:     General: Bowel sounds are normal.     Palpations: Abdomen is soft.     Tenderness: There is no abdominal tenderness.  Musculoskeletal: Normal range of motion.  Skin:    General: Skin is warm and dry.     Capillary Refill: Capillary refill takes less than 2 seconds.  Neurological:     General: No focal deficit present.     Mental Status: She is alert and oriented to person, place, and time.     Cranial Nerves: No cranial nerve deficit.     Sensory: No sensory deficit.  Psychiatric:        Mood and Affect: Mood normal.        Behavior: Behavior normal.        Thought Content: Thought content normal.        Judgment: Judgment normal.        Assessment And Plan:     1. Routine general medical examination at a health care facility . Behavior modifications  discussed and diet history reviewed.   . Pt will continue to exercise regularly and modify diet with low GI, plant based foods and decrease intake of processed foods.  . Recommend intake of daily multivitamin, Vitamin D, and calcium.  . Recommend mammogram for preventive screenings  2. Encounter for screening mammogram for breast cancer  Pt instructed on Self Breast Exam.According to ACOG guidelines Women aged 29 and older are recommended to get an annual mammogram. Form completed and given to patient contact the [facility] for appointment scheduing.   Pt encouraged to get annual mammogram  3. Essential hypertension . B/P is controlled.  . CMP ordered to check renal function.  . The importance of regular exercise  and dietary modification was stressed to the patient.  . Stressed importance of losing ten percent of her body weight to help with B/P control.  . The weight loss would help with decreasing cardiac and cancer risk as well.  - EKG 12-Lead - POCT Urinalysis Dipstick (81002) - POCT UA - Microalbumin  4. Prediabetes  Chronic, stable  Continue with current medications, she is doing well on Ozempic  Encouraged to limit intake of sugary foods and drinks  Encouraged to increase physical activity to 150 minutes per week  5. Chronic pain of both shoulders  Muscle tension  Encouraged to use heating pad and consider massage therapy to loosen shoulder muscles   [name], FNP

## 2018-01-23 LAB — LIPID PANEL
Chol/HDL Ratio: 4.1 ratio (ref 0.0–4.4)
Cholesterol, Total: 253 mg/dL — ABNORMAL HIGH (ref 100–199)
HDL: 62 mg/dL (ref >39)
LDL Calculated: 175 mg/dL — ABNORMAL HIGH (ref 0–99)
Triglycerides: 80 mg/dL (ref 0–149)
VLDL Cholesterol Cal: 16 mg/dL (ref 5–40)

## 2018-01-23 LAB — CMP14 + ANION GAP
ALT: 10 IU/L (ref 0–32)
AST: 13 IU/L (ref 0–40)
Albumin/Globulin Ratio: 2.4 — ABNORMAL HIGH (ref 1.2–2.2)
Albumin: 4.7 g/dL (ref 3.5–5.5)
Alkaline Phosphatase: 58 IU/L (ref 39–117)
Anion Gap: 19 mmol/L — ABNORMAL HIGH (ref 10.0–18.0)
BUN/Creatinine Ratio: 15 (ref 9–23)
BUN: 13 mg/dL (ref 6–24)
Bilirubin Total: 0.7 mg/dL (ref 0.0–1.2)
CO2: 22 mmol/L (ref 20–29)
Calcium: 9.7 mg/dL (ref 8.7–10.2)
Chloride: 102 mmol/L (ref 96–106)
Creatinine, Ser: 0.85 mg/dL (ref 0.57–1.00)
GFR calc Af Amer: 90 mL/min/{1.73_m2} (ref >59)
GFR calc non Af Amer: 78 mL/min/{1.73_m2} (ref >59)
Globulin, Total: 2 g/dL (ref 1.5–4.5)
Glucose: 87 mg/dL (ref 65–99)
Potassium: 4.4 mmol/L (ref 3.5–5.2)
Sodium: 143 mmol/L (ref 134–144)
Total Protein: 6.7 g/dL (ref 6.0–8.5)

## 2018-01-23 LAB — HEMOGLOBIN A1C
Est. average glucose Bld gHb Est-mCnc: 108 mg/dL
Hgb A1c MFr Bld: 5.4 % (ref 4.8–5.6)

## 2018-01-23 LAB — TSH: TSH: 1.01 u[IU]/mL (ref 0.450–4.500)

## 2018-01-28 ENCOUNTER — Other Ambulatory Visit: Payer: Self-pay | Admitting: Nurse Practitioner

## 2018-01-28 DIAGNOSIS — Z1231 Encounter for screening mammogram for malignant neoplasm of breast: Secondary | ICD-10-CM

## 2018-02-10 ENCOUNTER — Ambulatory Visit: Payer: BLUE CROSS/BLUE SHIELD | Admitting: Nurse Practitioner

## 2018-02-10 ENCOUNTER — Encounter: Payer: Self-pay | Admitting: Nurse Practitioner

## 2018-02-10 VITALS — BP 110/84 | HR 64 | Temp 98.3°F | Ht 66.0 in | Wt 192.4 lb

## 2018-02-10 DIAGNOSIS — E7800 Pure hypercholesterolemia, unspecified: Secondary | ICD-10-CM | POA: Diagnosis not present

## 2018-02-10 DIAGNOSIS — Z79899 Other long term (current) drug therapy: Secondary | ICD-10-CM | POA: Diagnosis not present

## 2018-02-10 DIAGNOSIS — R1031 Right lower quadrant pain: Secondary | ICD-10-CM | POA: Insufficient documentation

## 2018-02-10 MED ORDER — ATORVASTATIN CALCIUM 10 MG PO TABS: 10.0000 mg | ORAL_TABLET | Freq: Every day | ORAL | 3 refills | Status: DC

## 2018-02-10 NOTE — Progress Notes (Signed)
Subjective:     Patient ID: Alexandra [name] , female    DOB: 11-13-1965 , 53 y.o.   MRN: [id number]   Chief Complaint  Patient presents with  . Hip Pain    Patient states she has been having these symptoms for the past month and it has gotten worse.    HPI  She has a GYN - Alexandra [name] or Alexandra [name]. Postmenopausal.  Abdominal Pain  This is a new problem. The current episode started 1 to 4 weeks ago. The onset quality is gradual. The problem occurs constantly. The problem has been gradually worsening. The pain is located in the RLQ. The pain is at a severity of 6/10. The quality of the pain is aching. The abdominal pain does not radiate. Pertinent negatives include no constipation, diarrhea, fever, headaches or nausea. Nothing aggravates the pain. The pain is relieved by nothing. She has tried nothing for the symptoms.     Past Medical History:  Diagnosis Date  . Anxiety   . Hypertension      Family History  Problem Relation Age of Onset  . Breast cancer Maternal Grandmother      Current Outpatient Medications:  .  benazepril-hydrochlorthiazide (LOTENSIN HCT) 20-12.5 MG tablet, TAKE 1 TABLET BY MOUTH EVERY DAY, Disp: 30 tablet, Rfl: 2 .  cholecalciferol (VITAMIN D) 1000 UNITS tablet, Take 1,000 Units by mouth daily., Disp: , Rfl:  .  eszopiclone (LUNESTA) 1 MG TABS tablet, Take 1 tablet (1 mg total) by mouth at bedtime. Take immediately before bedtime, Disp: 30 tablet, Rfl: 2 .  Insulin Pen Needle (NOVOFINE PLUS) 32G X 4 MM MISC, Inject 1 pen into the skin daily. Inject 1 pen with Victoza SQ, Disp: 100 each, Rfl: 3 .  Semaglutide,0.25 or 0.5MG /DOS, (OZEMPIC, 0.25 OR 0.5 MG/DOSE,) 2 MG/1.5ML SOPN, Inject 0.5 mg into the skin once a week. Inject 0.25 mg weekly to skin for 4 weeks then increase to 0.5 mg weekly to skin, Disp: 1 pen, Rfl: 3 .  valACYclovir (VALTREX) 1000 MG tablet, Take 1,000 mg by mouth daily. Patient taking it as needed, Disp: , Rfl:    No Known Allergies   Review  of Systems  Constitutional: Negative for chills and fever.  HENT: Negative for sore throat.   Respiratory: Positive for chest tightness. Negative for cough and wheezing.   Cardiovascular: Negative for palpitations and leg swelling.  Gastrointestinal: Positive for abdominal pain. Negative for constipation, diarrhea and nausea.  Endocrine: Negative for polydipsia, polyphagia and polyuria.  Genitourinary: Negative for difficulty urinating.  Neurological: Negative for dizziness and headaches.     Today's Vitals   02/10/18 1158  BP: 110/84  Pulse: 64  Temp: 98.3 F (36.8 C)  TempSrc: Oral  SpO2: 98%  Weight: 192 lb 6.4 oz (87.3 kg)  Height: 5\' 6"  (1.676 m)  PainSc: 6   PainLoc: Hip   Body mass index is 31.05 kg/m.   Objective:  Physical Exam Vitals signs reviewed.  Constitutional:      Appearance: Normal appearance.  HENT:     Head: Normocephalic and atraumatic.     Nose: Nose normal.  Eyes:     Extraocular Movements: Extraocular movements intact.     Conjunctiva/sclera: Conjunctivae normal.     Pupils: Pupils are equal, round, and reactive to light.  Cardiovascular:     Rate and Rhythm: Normal rate and regular rhythm.     Pulses: Normal pulses.     Heart sounds: No murmur.  Pulmonary:  Effort: Pulmonary effort is normal. No respiratory distress.     Breath sounds: Normal breath sounds. No wheezing.  Abdominal:     General: Abdomen is flat. Bowel sounds are normal. There is no distension.     Palpations: Abdomen is soft.     Tenderness: There is abdominal tenderness in the right lower quadrant. There is no right CVA tenderness or left CVA tenderness. Negative signs include McBurney's sign.     Hernia: No hernia is present.  Skin:    General: Skin is warm and dry.  Neurological:     General: No focal deficit present.     Mental Status: She is alert.  Psychiatric:        Mood and Affect: Mood normal.         Assessment And Plan:     1. Right lower quadrant  abdominal pain  Persistent abdominal pain  Mildly tender to palpation  Negative obtruator and McBurney's  Will check ultrasound - US Pelvic Complete With Transvaginal; Future - Amylase - Lipase - CBC with Diff  2. Groin pain, right  Concerning for problem with ovary will check ultrasound to rule out cyst or any other mass  - US Pelvic Complete With Transvaginal; Future - Amylase - Lipase - CBC with Diff  3. Elevated cholesterol  Continues to be elevated and she has changed her diet to limit fried and fatty foods, decreased yolk of egg and cheese.  - atorvastatin (LIPITOR) 10 MG tablet; Take 1 tablet (10 mg total) by mouth daily.  Dispense: 30 tablet; Refill: 3  4. Encounter for long-term current use of medication  - Amylase - Lipase - CBC with Diff   [name], FNP

## 2018-02-10 NOTE — Patient Instructions (Signed)

## 2018-02-11 ENCOUNTER — Ambulatory Visit
Admission: RE | Admit: 2018-02-11 | Discharge: 2018-02-11 | Disposition: A | Discharge status: Home / Self Care | Payer: BLUE CROSS/BLUE SHIELD | Source: Ambulatory Visit | Attending: Nurse Practitioner | Admitting: Nurse Practitioner

## 2018-02-11 ENCOUNTER — Other Ambulatory Visit: Payer: Self-pay | Admitting: Nurse Practitioner

## 2018-02-11 DIAGNOSIS — R1031 Right lower quadrant pain: Secondary | ICD-10-CM

## 2018-02-11 LAB — CBC WITH DIFFERENTIAL/PLATELET
Basophils Absolute: 0 10*3/uL (ref 0.0–0.2)
Basos: 1 %
EOS (ABSOLUTE): 0.2 10*3/uL (ref 0.0–0.4)
Eos: 3 %
Hematocrit: 36.7 % (ref 34.0–46.6)
Hemoglobin: 12.4 g/dL (ref 11.1–15.9)
Immature Grans (Abs): 0 10*3/uL (ref 0.0–0.1)
Immature Granulocytes: 0 %
Lymphocytes Absolute: 3.7 10*3/uL — ABNORMAL HIGH (ref 0.7–3.1)
Lymphs: 47 %
MCH: 29.6 pg (ref 26.6–33.0)
MCHC: 33.8 g/dL (ref 31.5–35.7)
MCV: 88 fL (ref 79–97)
Monocytes Absolute: 0.6 10*3/uL (ref 0.1–0.9)
Monocytes: 7 %
Neutrophils Absolute: 3.3 10*3/uL (ref 1.4–7.0)
Neutrophils: 42 %
Platelets: 256 10*3/uL (ref 150–450)
RBC: 4.19 x10E6/uL (ref 3.77–5.28)
RDW: 14.1 % (ref 11.7–15.4)
WBC: 7.8 10*3/uL (ref 3.4–10.8)

## 2018-02-11 LAB — LIPASE: Lipase: 20 U/L (ref 14–72)

## 2018-02-11 LAB — AMYLASE: Amylase: 72 U/L (ref 31–110)

## 2018-02-14 ENCOUNTER — Encounter: Payer: Self-pay | Admitting: Nurse Practitioner

## 2018-02-16 ENCOUNTER — Encounter: Payer: Self-pay | Admitting: Nurse Practitioner

## 2018-02-17 ENCOUNTER — Encounter: Payer: Self-pay | Admitting: Nurse Practitioner

## 2018-03-01 ENCOUNTER — Other Ambulatory Visit: Payer: Self-pay | Admitting: Nurse Practitioner

## 2018-03-01 ENCOUNTER — Ambulatory Visit
Admission: RE | Admit: 2018-03-01 | Discharge: 2018-03-01 | Disposition: A | Discharge status: Home / Self Care | Payer: BLUE CROSS/BLUE SHIELD | Source: Ambulatory Visit | Attending: Nurse Practitioner | Admitting: Nurse Practitioner

## 2018-03-01 DIAGNOSIS — Z1231 Encounter for screening mammogram for malignant neoplasm of breast: Secondary | ICD-10-CM

## 2018-03-03 ENCOUNTER — Other Ambulatory Visit: Payer: Self-pay | Admitting: Nurse Practitioner

## 2018-03-03 DIAGNOSIS — R928 Other abnormal and inconclusive findings on diagnostic imaging of breast: Secondary | ICD-10-CM

## 2018-03-17 ENCOUNTER — Other Ambulatory Visit: Payer: Self-pay | Admitting: Internal Medicine

## 2018-03-17 ENCOUNTER — Ambulatory Visit: Payer: BLUE CROSS/BLUE SHIELD | Admitting: Nurse Practitioner

## 2018-03-17 VITALS — BP 112/80 | HR 66 | Ht 66.2 in | Wt 196.4 lb

## 2018-03-17 DIAGNOSIS — G4700 Insomnia, unspecified: Secondary | ICD-10-CM | POA: Diagnosis not present

## 2018-03-17 DIAGNOSIS — E782 Mixed hyperlipidemia: Secondary | ICD-10-CM

## 2018-03-17 MED ORDER — ESZOPICLONE 3 MG PO TABS: 1.0000 mg | ORAL_TABLET | Freq: Every day | ORAL | 3 refills | Status: DC

## 2018-03-17 MED ORDER — VALACYCLOVIR HCL 1 G PO TABS: 1000.0000 mg | ORAL_TABLET | Freq: Every day | ORAL | 1 refills | Status: DC

## 2018-03-17 NOTE — Progress Notes (Signed)
  Subjective:     Patient ID: Alexandra [name] , female    DOB: 1965-03-29 , 53 y.o.   MRN: [id number]   Chief Complaint  Patient presents with  . med check    4-6 week follow-up. would like a refill on lunesta, valtrex.     HPI  Hyperlipidemia - started on simvastitin.    Insomnia  Primary symptoms: no fragmented sleep, frequent awakening.  The current episode started more than one year. The onset quality is sudden. The problem is unchanged.     Past Medical History:  Diagnosis Date  . Anxiety   . Hypertension      Family History  Problem Relation Age of Onset  . Breast cancer Maternal Grandmother      Current Outpatient Medications:  .  atorvastatin (LIPITOR) 10 MG tablet, Take 1 tablet (10 mg total) by mouth daily., Disp: 30 tablet, Rfl: 3 .  benazepril-hydrochlorthiazide (LOTENSIN HCT) 20-12.5 MG tablet, TAKE 1 TABLET BY MOUTH EVERY DAY, Disp: 30 tablet, Rfl: 2 .  ciprofloxacin (CIPRO) 500 MG tablet, TAKE 1 TABLET BY MOUTH EVERY 12 HOURS FOR 5 DAYS, Disp: , Rfl:  .  eszopiclone (LUNESTA) 1 MG TABS tablet, Take 1 tablet (1 mg total) by mouth at bedtime. Take immediately before bedtime, Disp: 30 tablet, Rfl: 2 .  Insulin Pen Needle (NOVOFINE PLUS) 32G X 4 MM MISC, Inject 1 pen into the skin daily. Inject 1 pen with Victoza SQ, Disp: 100 each, Rfl: 3 .  OZEMPIC, 1 MG/DOSE, 2 MG/1.5ML SOPN, INJECT IM SUBQUTANEOUSLY ONCE WEEKLY (Patient taking differently: 1 mg. ), Disp: 2 pen, Rfl: 1 .  valACYclovir (VALTREX) 1000 MG tablet, Take 1,000 mg by mouth daily. Patient taking it as needed, Disp: , Rfl:    No Known Allergies   Review of Systems  Constitutional: Negative for fatigue.  Respiratory: Negative for cough.   Cardiovascular: Negative.  Negative for chest pain, palpitations and leg swelling.  Endocrine: Negative for polydipsia, polyphagia and polyuria.  Psychiatric/Behavioral: The patient has insomnia.      Today's Vitals   03/17/18 1554  BP: 112/80  Pulse: 66   Weight: 196 lb 6.4 oz (89.1 kg)  Height: 5' 6.2" (1.681 m)   Body mass index is 31.51 kg/m.   Objective:  Physical Exam Constitutional:      Appearance: Normal appearance.  Cardiovascular:     Rate and Rhythm: Normal rate and regular rhythm.  Pulmonary:     Effort: Pulmonary effort is normal. No respiratory distress.     Breath sounds: Normal breath sounds. No wheezing or rales.  Neurological:     General: No focal deficit present.     Mental Status: She is alert and oriented to person, place, and time.         Assessment And Plan:     1. Insomnia, unspecified type  Chronic, she is waking up in the middle of the night  She has been on 1 mg - eszopiclone 3 MG TABS; Take 1 tablet (3 mg total) by mouth at bedtime. Take immediately before bedtime  Dispense: 30 tablet; Refill: 3  2. Mixed hyperlipidemia  She is tolerating the statin well  Continue with current medication       [name], FNP

## 2018-03-19 ENCOUNTER — Ambulatory Visit
Admission: RE | Admit: 2018-03-19 | Discharge: 2018-03-19 | Disposition: A | Discharge status: Home / Self Care | Payer: BLUE CROSS/BLUE SHIELD | Source: Ambulatory Visit | Attending: Nurse Practitioner | Admitting: Nurse Practitioner

## 2018-03-19 DIAGNOSIS — R928 Other abnormal and inconclusive findings on diagnostic imaging of breast: Secondary | ICD-10-CM

## 2018-03-23 ENCOUNTER — Encounter: Payer: Self-pay | Admitting: Nurse Practitioner

## 2018-03-23 DIAGNOSIS — E782 Mixed hyperlipidemia: Secondary | ICD-10-CM | POA: Insufficient documentation

## 2018-04-26 ENCOUNTER — Ambulatory Visit: Payer: BLUE CROSS/BLUE SHIELD | Admitting: Nurse Practitioner

## 2018-04-29 ENCOUNTER — Other Ambulatory Visit: Payer: Self-pay | Admitting: Nurse Practitioner

## 2018-05-04 ENCOUNTER — Encounter: Payer: Self-pay | Admitting: Nurse Practitioner

## 2018-05-04 ENCOUNTER — Other Ambulatory Visit: Payer: Self-pay

## 2018-05-04 ENCOUNTER — Ambulatory Visit: Payer: BLUE CROSS/BLUE SHIELD | Admitting: Nurse Practitioner

## 2018-05-04 VITALS — BP 112/84 | Wt 187.0 lb

## 2018-05-04 DIAGNOSIS — Z7189 Other specified counseling: Secondary | ICD-10-CM

## 2018-05-04 DIAGNOSIS — J302 Other seasonal allergic rhinitis: Secondary | ICD-10-CM | POA: Diagnosis not present

## 2018-05-04 DIAGNOSIS — R7303 Prediabetes: Secondary | ICD-10-CM

## 2018-05-04 DIAGNOSIS — E782 Mixed hyperlipidemia: Secondary | ICD-10-CM | POA: Diagnosis not present

## 2018-05-04 MED ORDER — LEVOCETIRIZINE DIHYDROCHLORIDE 5 MG PO TABS: 5.0000 mg | ORAL_TABLET | Freq: Every evening | ORAL | 2 refills | Status: DC

## 2018-05-04 NOTE — Progress Notes (Addendum)
This visit type was conducted due to national recommendations for restrictions regarding the COVID-19 Pandemic (e.g. social distancing).  This format is felt to be most appropriate for this patient at this time.  All issues noted in this document were discussed and addressed.  No physical exam was performed (except for noted visual exam findings with Video Visits).  Please refer to the patient's chart (MyChart message for video visits and phone note for telephone visits) for the patient's consent to telehealth for [place].   Subjective:     Patient ID: Alexandra [name] , female    DOB: 1965/03/28 , 53 y.o.   MRN: [id number]  Virtual Visit via Telephone Note  I connected with Alexandra [name] on 05/04/18 at  8:30 AM EDT by video and verified that I am speaking with the correct person using two identifiers.   I discussed the limitations, risks, security and privacy concerns of performing an evaluation and management service by telephone and the availability of in person appointments. I also discussed with the patient that there may be a patient responsible charge related to this service. The patient expressed understanding and agreed to proceed.  Patient Location:  Home - spoke with Alexandra [name] Provider Location: Office/Home Chief Complaint  Patient presents with  . Hyperlipidemia    History of Present Illness:   Feels like something is stuck in the back of her throat for the last 3 weeks. Has gargled without relief.  Denies dyspnea.  Has not been taking her levoceterizine  Hyperlipidemia  This is a chronic problem. The current episode started more than 1 year ago. The problem is uncontrolled. Recent lipid tests were reviewed and are high. She has no history of diabetes. Factors aggravating her hyperlipidemia include fatty foods. Pertinent negatives include no chest pain. Current antihyperlipidemic treatment includes statins. There are no compliance problems.  Risk factors for coronary  artery disease include dyslipidemia, obesity and a sedentary lifestyle.     Past Medical History:  Diagnosis Date  . Anxiety   . Hypertension      Family History  Problem Relation Age of Onset  . Breast cancer Maternal Grandmother   . Heart disease Mother   . Hypertension Mother   . Sarcoidosis Father   . Diabetes Maternal Grandfather      Current Outpatient Medications:  .  atorvastatin (LIPITOR) 10 MG tablet, Take 1 tablet (10 mg total) by mouth daily., Disp: 30 tablet, Rfl: 3 .  benazepril-hydrochlorthiazide (LOTENSIN HCT) 20-12.5 MG tablet, TAKE 1 TABLET BY MOUTH EVERY DAY, Disp: 30 tablet, Rfl: 2 .  ciprofloxacin (CIPRO) 500 MG tablet, TAKE 1 TABLET BY MOUTH EVERY 12 HOURS FOR 5 DAYS, Disp: , Rfl:  .  eszopiclone 3 MG TABS, Take 1 tablet (3 mg total) by mouth at bedtime. Take immediately before bedtime, Disp: 30 tablet, Rfl: 3 .  Insulin Pen Needle (NOVOFINE PLUS) 32G X 4 MM MISC, Inject 1 pen into the skin daily. Inject 1 pen with Victoza SQ, Disp: 100 each, Rfl: 3 .  OZEMPIC, 1 MG/DOSE, 2 MG/1.5ML SOPN, INJECT IM SUBQUTANEOUSLY ONCE WEEKLY (Patient taking differently: 1 mg. ), Disp: 2 pen, Rfl: 1 .  valACYclovir (VALTREX) 1000 MG tablet, Take 1 tablet (1,000 mg total) by mouth daily. Patient taking it as needed, Disp: 90 tablet, Rfl: 1   No Known Allergies   Review of Systems  Constitutional: Negative for fatigue and fever.  Respiratory: Negative for cough.   Cardiovascular: Negative for chest pain, palpitations and leg  swelling.  Skin: Negative.   Neurological: Negative for dizziness and headaches.     Today's Vitals   05/04/18 0831  BP: 112/84  Weight: 187 lb (84.8 kg)    Observations/Objective: No acute distress.  No abnormalities with her neck       Assessment and Plan: 1. Mixed hyperlipidemia  Chronic, controlled  Continue with current medications  She has been nauseated and thought to be related to her  - Lipid Profile - CMP14 + Anion Gap  2.  Seasonal allergies  Will treat with xyzal - levocetirizine (XYZAL) 5 MG tablet; Take 1 tablet (5 mg total) by mouth every evening.  Dispense: 30 tablet; Refill: 2  3. Prediabetes  Chronic, controlled  Continue with current medications  Encouraged to limit intake of sugary foods and drinks  Encouraged to increase physical activity to 150 minutes per week  She will come next Monday for labs - Hemoglobin A1c    Follow Up Instructions: - return call to office as needed, she will have her 3 month follow up  I discussed the assessment and treatment plan with the patient. The patient was provided an opportunity to ask questions and all were answered. The patient agreed with the plan and demonstrated an understanding of the instructions.   The patient was advised to call back or seek an in-person evaluation if the symptoms worsen or if the condition fails to improve as anticipated.  COVID-19 Education: The signs and symptoms of COVID-19 were discussed with the patient and how to seek care for testing (follow up with PCP or arrange E-visit).  The importance of social distancing was discussed today.   Patient Risk:   After full review of this patients clinical status, I feel that they are at least moderate risk at this time.   I provided 16 minutes of non-face-to-face time during this encounter.   Alexandra Brine, FNP

## 2018-05-05 ENCOUNTER — Other Ambulatory Visit: Payer: BLUE CROSS/BLUE SHIELD

## 2018-05-08 ENCOUNTER — Other Ambulatory Visit: Payer: Self-pay | Admitting: Internal Medicine

## 2018-05-10 ENCOUNTER — Other Ambulatory Visit: Payer: BLUE CROSS/BLUE SHIELD

## 2018-05-10 ENCOUNTER — Other Ambulatory Visit: Payer: Self-pay

## 2018-05-10 ENCOUNTER — Other Ambulatory Visit: Payer: Self-pay | Admitting: Nurse Practitioner

## 2018-05-11 LAB — LIPID PANEL
Chol/HDL Ratio: 3.4 ratio (ref 0.0–4.4)
Cholesterol, Total: 155 mg/dL (ref 100–199)
HDL: 46 mg/dL (ref >39)
LDL Calculated: 90 mg/dL (ref 0–99)
Triglycerides: 94 mg/dL (ref 0–149)
VLDL Cholesterol Cal: 19 mg/dL (ref 5–40)

## 2018-05-11 LAB — CMP14 + ANION GAP
ALT: 13 IU/L (ref 0–32)
AST: 21 IU/L (ref 0–40)
Albumin/Globulin Ratio: 2.7 — ABNORMAL HIGH (ref 1.2–2.2)
Albumin: 4.9 g/dL (ref 3.8–4.9)
Alkaline Phosphatase: 61 IU/L (ref 39–117)
Anion Gap: 28 mmol/L — ABNORMAL HIGH (ref 10.0–18.0)
BUN/Creatinine Ratio: 15 (ref 9–23)
BUN: 15 mg/dL (ref 6–24)
Bilirubin Total: 0.6 mg/dL (ref 0.0–1.2)
CO2: 22 mmol/L (ref 20–29)
Calcium: 9.5 mg/dL (ref 8.7–10.2)
Chloride: 94 mmol/L — ABNORMAL LOW (ref 96–106)
Creatinine, Ser: 1 mg/dL (ref 0.57–1.00)
GFR calc Af Amer: 74 mL/min/{1.73_m2} (ref >59)
GFR calc non Af Amer: 64 mL/min/{1.73_m2} (ref >59)
Globulin, Total: 1.8 g/dL (ref 1.5–4.5)
Glucose: 104 mg/dL — ABNORMAL HIGH (ref 65–99)
Potassium: 4.2 mmol/L (ref 3.5–5.2)
Sodium: 144 mmol/L (ref 134–144)
Total Protein: 6.7 g/dL (ref 6.0–8.5)

## 2018-05-11 LAB — HEMOGLOBIN A1C
Est. average glucose Bld gHb Est-mCnc: 111 mg/dL
Hgb A1c MFr Bld: 5.5 % (ref 4.8–5.6)

## 2018-05-11 LAB — HIV ANTIBODY (ROUTINE TESTING W REFLEX): HIV Screen 4th Generation wRfx: NONREACTIVE

## 2018-05-14 ENCOUNTER — Encounter: Payer: Self-pay | Admitting: Nurse Practitioner

## 2018-05-23 LAB — PROTEIN ELECTROPHORESIS
A/G Ratio: 1.7 (ref 0.7–1.7)
Albumin ELP: 4 g/dL (ref 2.9–4.4)
Alpha 1: 0.2 g/dL (ref 0.0–0.4)
Alpha 2: 0.9 g/dL (ref 0.4–1.0)
Beta: 0.8 g/dL (ref 0.7–1.3)
Gamma Globulin: 0.5 g/dL (ref 0.4–1.8)
Globulin, Total: 2.4 g/dL (ref 2.2–3.9)
Total Protein: 6.4 g/dL (ref 6.0–8.5)

## 2018-05-23 LAB — SPECIMEN STATUS REPORT

## 2018-06-05 ENCOUNTER — Other Ambulatory Visit: Payer: Self-pay | Admitting: Nurse Practitioner

## 2018-06-05 DIAGNOSIS — E7800 Pure hypercholesterolemia, unspecified: Secondary | ICD-10-CM

## 2018-07-07 ENCOUNTER — Other Ambulatory Visit: Payer: Self-pay | Admitting: Nurse Practitioner

## 2018-07-12 ENCOUNTER — Other Ambulatory Visit: Payer: Self-pay | Admitting: Nurse Practitioner

## 2018-08-03 ENCOUNTER — Other Ambulatory Visit: Payer: Self-pay | Admitting: Nurse Practitioner

## 2018-08-06 ENCOUNTER — Encounter: Payer: Self-pay | Admitting: Nurse Practitioner

## 2018-08-09 ENCOUNTER — Telehealth: Payer: Self-pay

## 2018-08-09 NOTE — Telephone Encounter (Signed)
We received a letter from the minute that pt was tested for COVID. I have called pt and left v/m to call the office back [name] FNP-BC wanted to know if she was having symptoms. YRL,RMA

## 2018-08-28 ENCOUNTER — Other Ambulatory Visit: Payer: Self-pay | Admitting: Nurse Practitioner

## 2018-09-17 ENCOUNTER — Other Ambulatory Visit: Payer: Self-pay | Admitting: Nurse Practitioner

## 2018-09-17 DIAGNOSIS — E7800 Pure hypercholesterolemia, unspecified: Secondary | ICD-10-CM

## 2018-10-19 ENCOUNTER — Other Ambulatory Visit: Payer: Self-pay | Admitting: Nurse Practitioner

## 2018-10-19 DIAGNOSIS — E7800 Pure hypercholesterolemia, unspecified: Secondary | ICD-10-CM

## 2018-10-20 ENCOUNTER — Other Ambulatory Visit: Payer: Self-pay | Admitting: Nurse Practitioner

## 2018-10-28 ENCOUNTER — Other Ambulatory Visit: Payer: Self-pay | Admitting: Nurse Practitioner

## 2018-11-03 ENCOUNTER — Other Ambulatory Visit: Payer: Self-pay

## 2018-11-03 ENCOUNTER — Other Ambulatory Visit: Payer: Self-pay | Admitting: Nurse Practitioner

## 2018-11-03 ENCOUNTER — Ambulatory Visit: Payer: BC Managed Care – PPO | Admitting: Nurse Practitioner

## 2018-11-03 ENCOUNTER — Encounter: Payer: Self-pay | Admitting: Nurse Practitioner

## 2018-11-03 VITALS — BP 104/68 | HR 84 | Temp 98.5°F | Ht 66.2 in | Wt 174.8 lb

## 2018-11-03 DIAGNOSIS — G4700 Insomnia, unspecified: Secondary | ICD-10-CM

## 2018-11-03 DIAGNOSIS — E782 Mixed hyperlipidemia: Secondary | ICD-10-CM

## 2018-11-03 DIAGNOSIS — E7800 Pure hypercholesterolemia, unspecified: Secondary | ICD-10-CM

## 2018-11-03 DIAGNOSIS — R7303 Prediabetes: Secondary | ICD-10-CM

## 2018-11-03 DIAGNOSIS — I10 Essential (primary) hypertension: Secondary | ICD-10-CM

## 2018-11-03 DIAGNOSIS — Z23 Encounter for immunization: Secondary | ICD-10-CM | POA: Diagnosis not present

## 2018-11-03 MED ORDER — TETANUS-DIPHTH-ACELL PERTUSSIS 5-2.5-18.5 LF-MCG/0.5 IM SUSP
0.5000 mL | Freq: Once | INTRAMUSCULAR | Status: AC
  Administered 2018-11-03: 0.5 mL via INTRAMUSCULAR

## 2018-11-03 MED ORDER — OZEMPIC (1 MG/DOSE) 2 MG/1.5ML ~~LOC~~ SOPN: 1.0000 mg | PEN_INJECTOR | SUBCUTANEOUS | 1 refills | Status: AC

## 2018-11-03 MED ORDER — ESZOPICLONE 3 MG PO TABS: 3.0000 mg | ORAL_TABLET | Freq: Every day | ORAL | 3 refills | Status: DC

## 2018-11-03 MED ORDER — ATORVASTATIN CALCIUM 10 MG PO TABS: 10.0000 mg | ORAL_TABLET | Freq: Every day | ORAL | 1 refills | Status: DC

## 2018-11-03 MED ORDER — BENAZEPRIL-HYDROCHLOROTHIAZIDE 20-12.5 MG PO TABS: 1.0000 | ORAL_TABLET | Freq: Every day | ORAL | 1 refills | Status: DC

## 2018-11-03 NOTE — Progress Notes (Signed)
Subjective:     Patient ID: Alexandra [name] , female    DOB: 02-11-1965 , 53 y.o.   MRN: [id number]   She is no longer planning to move to [facility] until next year.    Wt Readings from Last 3 Encounters: 11/03/18 : 174 lb 12.8 oz (79.3 kg) 05/04/18 : 187 lb (84.8 kg) 03/17/18 : 196 lb 6.4 oz (89.1 kg)   Insomnia Primary symptoms: sleep disturbance, frequent awakening.  The current episode started more than one year. The onset quality is undetermined. The problem occurs every several days. Past treatments include alcohol.  Diabetes She presents for her follow-up diabetic visit. She has type 2 diabetes mellitus. No MedicAlert identification noted. Her disease course has been stable. Hypoglycemia symptoms include headaches. Associated symptoms include polydipsia. Pertinent negatives for diabetes include no blurred vision, no chest pain, no polyphagia and no polyuria. Risk factors for coronary artery disease include hypertension and diabetes mellitus. Current diabetic treatments: Was on Victoza until May, no insurance since then. When asked about meal planning, she reported none. She has not had a previous visit with a dietitian. An ACE inhibitor/angiotensin II receptor blocker is being taken. She does not see a podiatrist.Eye exam is not current.  Hypertension This is a chronic problem. The current episode started more than 1 year ago. The problem has been gradually worsening since onset. The problem is uncontrolled. Associated symptoms include headaches. Pertinent negatives include no anxiety, blurred vision or chest pain. There are no associated agents to hypertension. Risk factors for coronary artery disease include obesity. Treatments tried: Has been without her blood pressure medications in the last 3 months due to no insurance.     Past Medical History:  Diagnosis Date  . Anxiety   . Hypertension       Current Outpatient Medications:  .  atorvastatin (LIPITOR) 10 MG tablet, TAKE  1 TABLET BY MOUTH EVERY DAY, Disp: 30 tablet, Rfl: 0 .  benazepril-hydrochlorthiazide (LOTENSIN HCT) 20-12.5 MG tablet, TAKE 1 TABLET BY MOUTH EVERY DAY, Disp: 30 tablet, Rfl: 2 .  eszopiclone 3 MG TABS, Take 1 tablet (3 mg total) by mouth at bedtime. Take immediately before bedtime, Disp: 30 tablet, Rfl: 3 .  Insulin Pen Needle (NOVOFINE PLUS) 32G X 4 MM MISC, Inject 1 pen into the skin daily. Inject 1 pen with Victoza SQ, Disp: 100 each, Rfl: 3 .  levocetirizine (XYZAL) 5 MG tablet, Take 1 tablet (5 mg total) by mouth every evening., Disp: 30 tablet, Rfl: 2 .  OZEMPIC, 1 MG/DOSE, 2 MG/1.5ML SOPN, INJECT IM SUBQUTANEOUSLY ONCE WEEKLY, Disp: 3 pen, Rfl: 1 .  valACYclovir (VALTREX) 1000 MG tablet, Take 1 tablet (1,000 mg total) by mouth daily. Patient taking it as needed, Disp: 90 tablet, Rfl: 1   Review of Systems  Constitutional: Negative.   Eyes: Negative for blurred vision.  Respiratory: Negative.   Cardiovascular: Negative.  Negative for chest pain.  Endocrine: Positive for polydipsia. Negative for polyphagia and polyuria.  Musculoskeletal: Negative.   Skin: Negative.   Neurological: Positive for headaches.  Psychiatric/Behavioral: Positive for sleep disturbance. The patient has insomnia.      Today's Vitals   11/03/18 1541  BP: 104/68  Pulse: 84  Temp: 98.5 F (36.9 C)  TempSrc: Oral  Weight: 174 lb 12.8 oz (79.3 kg)  Height: 5' 6.2" (1.681 m)  PainSc: 0-No pain   Body mass index is 28.04 kg/m.   Objective:  Physical Exam Constitutional:      Appearance: She  is well-developed.  Cardiovascular:     Rate and Rhythm: Normal rate and regular rhythm.     Heart sounds: Normal heart sounds.  Pulmonary:     Effort: Pulmonary effort is normal.     Breath sounds: Normal breath sounds.  Musculoskeletal: Normal range of motion.  Skin:    General: Skin is warm and dry.     Capillary Refill: Capillary refill takes less than 2 seconds.  Neurological:     General: No focal  deficit present.     Mental Status: She is alert and oriented to person, place, and time.  Psychiatric:        Mood and Affect: Mood normal.        Behavior: Behavior normal.        Thought Content: Thought content normal.        Judgment: Judgment normal.         Assessment And Plan:      1. Essential hypertension  Chronic, well controlled  Continue with current medications - benazepril-hydrochlorthiazide (LOTENSIN HCT) 20-12.5 MG tablet; Take 1 tablet by mouth daily.  Dispense: 90 tablet; Refill: 1 - CMP14+EGFR  2. Mixed hyperlipidemia  Chronic, controlled  Continue with current medications  No issues with medications - Lipid panel  3. Insomnia, unspecified type  Chronic, sent new Rx for 3 mg nightly - Eszopiclone 3 MG TABS; Take 1 tablet (3 mg total) by mouth at bedtime. Take immediately before bedtime  Dispense: 30 tablet; Refill: 3  4. Prediabetes Chronic, stable continue with ozempic tolerating well. - Hemoglobin A1c - Semaglutide, 1 MG/DOSE, (OZEMPIC, 1 MG/DOSE,) 2 MG/1.5ML SOPN; Inject 1 mg into the skin once a week.  Dispense: 9 mL; Refill: 1  5. Encounter for immunization  Will give tetanus vaccine today while in office. Refer to order management. TDAP will be administered to adults 68-59 years old every 10 years. - Tdap (BOOSTRIX) injection 0.5 mL  6. Need for influenza vaccination  Influenza vaccine given in office  Advised to take Tylenol as needed for muscle aches or fever       [name], FNP

## 2018-11-04 LAB — CMP14+EGFR
ALT: 13 IU/L (ref 0–32)
AST: 17 IU/L (ref 0–40)
Albumin/Globulin Ratio: 2.3 — ABNORMAL HIGH (ref 1.2–2.2)
Albumin: 4.6 g/dL (ref 3.8–4.9)
Alkaline Phosphatase: 60 IU/L (ref 39–117)
BUN/Creatinine Ratio: 13 (ref 9–23)
BUN: 12 mg/dL (ref 6–24)
Bilirubin Total: 0.4 mg/dL (ref 0.0–1.2)
CO2: 28 mmol/L (ref 20–29)
Calcium: 9.6 mg/dL (ref 8.7–10.2)
Chloride: 104 mmol/L (ref 96–106)
Creatinine, Ser: 0.95 mg/dL (ref 0.57–1.00)
GFR calc Af Amer: 78 mL/min/{1.73_m2} (ref >59)
GFR calc non Af Amer: 68 mL/min/{1.73_m2} (ref >59)
Globulin, Total: 2 g/dL (ref 1.5–4.5)
Glucose: 86 mg/dL (ref 65–99)
Potassium: 4.1 mmol/L (ref 3.5–5.2)
Sodium: 145 mmol/L — ABNORMAL HIGH (ref 134–144)
Total Protein: 6.6 g/dL (ref 6.0–8.5)

## 2018-11-04 LAB — LIPID PANEL WITH LDL/HDL RATIO
Cholesterol, Total: 155 mg/dL (ref 100–199)
HDL: 52 mg/dL (ref >39)
LDL Chol Calc (NIH): 90 mg/dL (ref 0–99)
LDL/HDL Ratio: 1.7 ratio (ref 0.0–3.2)
Triglycerides: 66 mg/dL (ref 0–149)
VLDL Cholesterol Cal: 13 mg/dL (ref 5–40)

## 2018-11-04 LAB — HGB A1C W/O EAG: Hgb A1c MFr Bld: 5.6 % (ref 4.8–5.6)

## 2018-12-06 ENCOUNTER — Ambulatory Visit: Payer: BC Managed Care – PPO | Admitting: Nurse Practitioner

## 2018-12-06 ENCOUNTER — Encounter: Payer: Self-pay | Admitting: Nurse Practitioner

## 2018-12-06 ENCOUNTER — Other Ambulatory Visit: Payer: Self-pay

## 2018-12-06 VITALS — BP 110/72 | HR 78 | Temp 98.8°F | Ht 66.2 in | Wt 161.4 lb

## 2018-12-06 DIAGNOSIS — R3915 Urgency of urination: Secondary | ICD-10-CM

## 2018-12-06 DIAGNOSIS — R634 Abnormal weight loss: Secondary | ICD-10-CM | POA: Diagnosis not present

## 2018-12-06 DIAGNOSIS — N760 Acute vaginitis: Secondary | ICD-10-CM

## 2018-12-06 DIAGNOSIS — R319 Hematuria, unspecified: Secondary | ICD-10-CM

## 2018-12-06 DIAGNOSIS — N390 Urinary tract infection, site not specified: Secondary | ICD-10-CM

## 2018-12-06 DIAGNOSIS — B9689 Other specified bacterial agents as the cause of diseases classified elsewhere: Secondary | ICD-10-CM

## 2018-12-06 LAB — POCT URINALYSIS DIPSTICK
Glucose, UA: POSITIVE — AB
Nitrite, UA: POSITIVE
Protein, UA: POSITIVE — AB
Spec Grav, UA: 1.01 (ref 1.010–1.025)
Urobilinogen, UA: 4 E.U./dL — AB
pH, UA: 5 (ref 5.0–8.0)

## 2018-12-06 MED ORDER — CIPROFLOXACIN HCL 500 MG PO TABS: 500.0000 mg | ORAL_TABLET | Freq: Two times a day (BID) | ORAL | 0 refills | Status: DC

## 2018-12-06 NOTE — Progress Notes (Signed)
Subjective:     Patient ID: Alexandra [name] , female    DOB: Jun 13, 1965 , 53 y.o.   MRN: [id number]   Chief Complaint  Patient presents with  . Weight Loss    patient stated she has been losing weight.    HPI  In the last month she has lost 14lbs, she has decreased her calorie intake.  She is having constipation.  She wants to make sure that is not too much at one time.  She is taking Ozempic 1mg  weekly.  She went to urgent care on Saturday was positive for bacterial vaginosis.  She now feels she has a urinary tract infection.    Wt Readings from Last 3 Encounters: 12/06/18 : 161 lb 6.4 oz (73.2 kg) 11/03/18 : 174 lb 12.8 oz (79.3 kg) 05/04/18 : 187 lb (84.8 kg)     Past Medical History:  Diagnosis Date  . Anxiety   . Hypertension      Family History  Problem Relation Age of Onset  . Breast cancer Maternal Grandmother   . Heart disease Mother   . Hypertension Mother   . Sarcoidosis Father   . Diabetes Maternal Grandfather      Current Outpatient Medications:  .  atorvastatin (LIPITOR) 10 MG tablet, Take 1 tablet (10 mg total) by mouth daily., Disp: 90 tablet, Rfl: 1 .  benazepril-hydrochlorthiazide (LOTENSIN HCT) 20-12.5 MG tablet, Take 1 tablet by mouth daily., Disp: 90 tablet, Rfl: 1 .  Eszopiclone 3 MG TABS, Take 1 tablet (3 mg total) by mouth at bedtime. Take immediately before bedtime, Disp: 30 tablet, Rfl: 3 .  Insulin Pen Needle (NOVOFINE PLUS) 32G X 4 MM MISC, Inject 1 pen into the skin daily. Inject 1 pen with Victoza SQ, Disp: 100 each, Rfl: 3 .  levocetirizine (XYZAL) 5 MG tablet, Take 1 tablet (5 mg total) by mouth every evening., Disp: 30 tablet, Rfl: 2 .  Semaglutide, 1 MG/DOSE, (OZEMPIC, 1 MG/DOSE,) 2 MG/1.5ML SOPN, Inject 1 mg into the skin once a week., Disp: 9 mL, Rfl: 1 .  valACYclovir (VALTREX) 1000 MG tablet, Take 1 tablet (1,000 mg total) by mouth daily. Patient taking it as needed, Disp: 90 tablet, Rfl: 1   No Known Allergies   Review of  Systems  Constitutional: Negative.   Respiratory: Negative.   Cardiovascular: Negative.  Negative for chest pain, palpitations and leg swelling.  Genitourinary: Positive for urgency.       Vaginal odor  Neurological: Negative.  Negative for dizziness and headaches.  Psychiatric/Behavioral: Negative.      Today's Vitals   12/06/18 1534  BP: 110/72  Pulse: 78  Temp: 98.8 F (37.1 C)  TempSrc: Oral  Weight: 161 lb 6.4 oz (73.2 kg)  Height: 5' 6.2" (1.681 m)  PainSc: 0-No pain   Body mass index is 25.89 kg/m.   Objective:  Physical Exam Constitutional:      Appearance: Normal appearance.  Cardiovascular:     Rate and Rhythm: Normal rate and regular rhythm.     Pulses: Normal pulses.     Heart sounds: Normal heart sounds. No murmur.  Pulmonary:     Effort: Pulmonary effort is normal. No respiratory distress.     Breath sounds: Normal breath sounds.  Skin:    Capillary Refill: Capillary refill takes less than 2 seconds.  Neurological:     General: No focal deficit present.     Mental Status: She is alert and oriented to person, place, and time.  Assessment And Plan:     1. Weight loss  She has had a 14 lb weight loss since her last visit  I have advised her to decrease her ozempic to 0.5mg  week.  - TSH  2. Urinary urgency  Urinalysis positive for nitrites - POCT Urinalysis Dipstick (81002)  3. Urinary tract infection with hematuria, site unspecified  Positive leuks and nitrites in urinalysis will send for culture  Will treat with cipro for 5 days will change pending results - Culture, Urine - nitrofurantoin, macrocrystal-monohydrate, (MACROBID) 100 MG capsule; Take 1 capsule (100 mg total) by mouth 2 (two) times daily for 5 days.  Dispense: 10 capsule; Refill: 0  4. Bacterial vaginosis  Treated at urgent care continues with antibiotics    [name], FNP    THE PATIENT IS ENCOURAGED TO PRACTICE SOCIAL DISTANCING DUE TO THE COVID-19  PANDEMIC.

## 2018-12-07 LAB — TSH: TSH: 0.921 u[IU]/mL (ref 0.450–4.500)

## 2018-12-08 LAB — URINE CULTURE

## 2018-12-08 MED ORDER — NITROFURANTOIN MONOHYD MACRO 100 MG PO CAPS: 100.0000 mg | ORAL_CAPSULE | Freq: Two times a day (BID) | ORAL | 0 refills | Status: AC

## 2019-01-01 ENCOUNTER — Other Ambulatory Visit: Payer: Self-pay | Admitting: Nurse Practitioner

## 2019-01-01 DIAGNOSIS — I10 Essential (primary) hypertension: Secondary | ICD-10-CM

## 2019-01-05 ENCOUNTER — Other Ambulatory Visit (HOSPITAL_COMMUNITY)
Admission: RE | Admit: 2019-01-05 | Discharge: 2019-01-05 | Disposition: A | Discharge status: Home / Self Care | Payer: BC Managed Care – PPO | Source: Ambulatory Visit | Attending: Nurse Practitioner | Admitting: Nurse Practitioner

## 2019-01-05 ENCOUNTER — Encounter: Payer: Self-pay | Admitting: Nurse Practitioner

## 2019-01-05 ENCOUNTER — Other Ambulatory Visit: Payer: Self-pay

## 2019-01-05 ENCOUNTER — Ambulatory Visit: Payer: BC Managed Care – PPO | Admitting: Nurse Practitioner

## 2019-01-05 VITALS — BP 110/78 | HR 84 | Temp 98.3°F | Ht 66.2 in | Wt 159.0 lb

## 2019-01-05 DIAGNOSIS — R300 Dysuria: Secondary | ICD-10-CM

## 2019-01-05 DIAGNOSIS — Z87440 Personal history of urinary (tract) infections: Secondary | ICD-10-CM | POA: Diagnosis not present

## 2019-01-05 LAB — POCT URINALYSIS DIPSTICK
Bilirubin, UA: NEGATIVE
Glucose, UA: NEGATIVE
Ketones, UA: NEGATIVE
Leukocytes, UA: NEGATIVE
Nitrite, UA: NEGATIVE
Protein, UA: POSITIVE — AB
Spec Grav, UA: 1.02 (ref 1.010–1.025)
Urobilinogen, UA: 0.2 E.U./dL
pH, UA: 5 (ref 5.0–8.0)

## 2019-01-05 MED ORDER — METRONIDAZOLE 500 MG PO TABS: 500.0000 mg | ORAL_TABLET | Freq: Three times a day (TID) | ORAL | 0 refills | Status: AC

## 2019-01-05 NOTE — Progress Notes (Signed)
This visit occurred during the SARS-CoV-2 public health emergency.  Safety protocols were in place, including screening questions prior to the visit, additional usage of staff PPE, and extensive cleaning of exam room while observing appropriate contact time as indicated for disinfecting solutions.  Subjective:     Patient ID: Alexandra [name] , female    DOB: May 26, 1965 , 53 y.o.   MRN: [id number]   Chief Complaint  Patient presents with  . Dysuria    patient stated she feels like she has a kidney infection becasue she keeps getting UTI's. her symptoms started on 01/01/19 and she is having the urgency, chills, back and abdominal pain    HPI  She was seen with the [facility] e-[facility] and was diagnosed with a urinary tract infection, she was treated with Macrobid.  She was having right lower flank pain.  She notices after having intercourse with her significant other she will have these symptoms. She is taking [facility] urinary health.      Past Medical History:  Diagnosis Date  . Anxiety   . Hypertension      Family History  Problem Relation Age of Onset  . Breast cancer Maternal Grandmother   . Heart disease Mother   . Hypertension Mother   . Sarcoidosis Father   . Diabetes Maternal Grandfather      Current Outpatient Medications:  .  atorvastatin (LIPITOR) 10 MG tablet, Take 1 tablet (10 mg total) by mouth daily., Disp: 90 tablet, Rfl: 1 .  benazepril-hydrochlorthiazide (LOTENSIN HCT) 20-12.5 MG tablet, TAKE 1 TABLET BY MOUTH EVERY DAY, Disp: 30 tablet, Rfl: 1 .  Eszopiclone 3 MG TABS, Take 1 tablet (3 mg total) by mouth at bedtime. Take immediately before bedtime, Disp: 30 tablet, Rfl: 3 .  Insulin Pen Needle (NOVOFINE PLUS) 32G X 4 MM MISC, Inject 1 pen into the skin daily. Inject 1 pen with Victoza SQ, Disp: 100 each, Rfl: 3 .  levocetirizine (XYZAL) 5 MG tablet, Take 1 tablet (5 mg total) by mouth every evening., Disp: 30 tablet, Rfl: 2 .  Semaglutide, 1 MG/DOSE,  (OZEMPIC, 1 MG/DOSE,) 2 MG/1.5ML SOPN, Inject 1 mg into the skin once a week., Disp: 9 mL, Rfl: 1 .  valACYclovir (VALTREX) 1000 MG tablet, Take 1 tablet (1,000 mg total) by mouth daily. Patient taking it as needed, Disp: 90 tablet, Rfl: 1   No Known Allergies   Review of Systems  Constitutional: Negative.   Respiratory: Negative.   Cardiovascular: Negative.   Genitourinary: Positive for dysuria. Negative for vaginal discharge.  Neurological: Negative for dizziness and headaches.  Psychiatric/Behavioral: Negative.      Today's Vitals   01/05/19 1542  BP: 110/78  Pulse: 84  Temp: 98.3 F (36.8 C)  TempSrc: Oral  Weight: 159 lb (72.1 kg)  Height: 5' 6.2" (1.681 m)  PainSc: 0-No pain   Body mass index is 25.51 kg/m.   Objective:  Physical Exam Constitutional:      General: She is not in acute distress.    Appearance: Normal appearance.  Cardiovascular:     Rate and Rhythm: Normal rate and regular rhythm.     Pulses: Normal pulses.     Heart sounds: Normal heart sounds. No murmur.  Pulmonary:     Effort: Pulmonary effort is normal. No respiratory distress.     Breath sounds: Normal breath sounds.  Abdominal:     Tenderness: There is no abdominal tenderness. There is right CVA tenderness (mild) and left CVA tenderness (  mild).  Skin:    Capillary Refill: Capillary refill takes less than 2 seconds.  Neurological:     General: No focal deficit present.     Mental Status: She is alert and oriented to person, place, and time.  Psychiatric:        Mood and Affect: Mood normal.        Behavior: Behavior normal.        Thought Content: Thought content normal.        Judgment: Judgment normal.         Assessment And Plan:     1. Dysuria  Recently treated for another urinary tract infection  Will check cytology urine to see if this is related to bacterial vagninosis  Discussed considering using a condom with her partner to decrease risk for pH imbalance and to eat  yogurt daily - POCT Urinalysis Dipstick [id number]) - Urine cytology ancillary only - metroNIDAZOLE (FLAGYL) 500 MG tablet; Take 1 tablet (500 mg total) by mouth 3 (three) times daily for 10 days.  Dispense: 30 tablet; Refill: 0   [name], FNP    THE PATIENT IS ENCOURAGED TO PRACTICE SOCIAL DISTANCING DUE TO THE COVID-19 PANDEMIC.

## 2019-01-13 LAB — URINE CYTOLOGY ANCILLARY ONLY
Bacterial Vaginitis-Urine: NEGATIVE
Candida Urine: NEGATIVE
Chlamydia: NEGATIVE
Comment: NEGATIVE
Comment: NEGATIVE
Comment: NORMAL
Neisseria Gonorrhea: NEGATIVE
Trichomonas: NEGATIVE

## 2019-01-20 ENCOUNTER — Other Ambulatory Visit: Payer: Self-pay | Admitting: Nurse Practitioner

## 2019-01-20 DIAGNOSIS — I10 Essential (primary) hypertension: Secondary | ICD-10-CM

## 2019-01-27 ENCOUNTER — Encounter: Payer: BLUE CROSS/BLUE SHIELD | Admitting: Internal Medicine

## 2019-02-01 ENCOUNTER — Ambulatory Visit (INDEPENDENT_AMBULATORY_CARE_PROVIDER_SITE_OTHER): Payer: BC Managed Care – PPO | Admitting: Nurse Practitioner

## 2019-02-01 ENCOUNTER — Encounter: Payer: Self-pay | Admitting: Nurse Practitioner

## 2019-02-01 ENCOUNTER — Other Ambulatory Visit: Payer: Self-pay

## 2019-02-01 VITALS — BP 110/70 | HR 75 | Temp 98.4°F | Ht 66.0 in | Wt 154.4 lb

## 2019-02-01 DIAGNOSIS — Z1211 Encounter for screening for malignant neoplasm of colon: Secondary | ICD-10-CM

## 2019-02-01 DIAGNOSIS — Z0000 Encounter for general adult medical examination without abnormal findings: Secondary | ICD-10-CM

## 2019-02-01 DIAGNOSIS — E782 Mixed hyperlipidemia: Secondary | ICD-10-CM

## 2019-02-01 DIAGNOSIS — I10 Essential (primary) hypertension: Secondary | ICD-10-CM | POA: Diagnosis not present

## 2019-02-01 DIAGNOSIS — R7303 Prediabetes: Secondary | ICD-10-CM

## 2019-02-01 LAB — POCT UA - MICROALBUMIN
Albumin/Creatinine Ratio, Urine, POC: 300
Creatinine, POC: 50 mg/dL
Microalbumin Ur, POC: 80 mg/L

## 2019-02-01 LAB — POCT URINALYSIS DIPSTICK
Bilirubin, UA: NEGATIVE
Glucose, UA: NEGATIVE
Leukocytes, UA: NEGATIVE
Nitrite, UA: NEGATIVE
Protein, UA: NEGATIVE
Spec Grav, UA: <=1.005 — AB (ref 1.010–1.025)
Urobilinogen, UA: 0.2 E.U./dL
pH, UA: 5.5 (ref 5.0–8.0)

## 2019-02-01 NOTE — Progress Notes (Signed)
Subjective:     Patient ID: Alexandra [name] , female    DOB: 20-Sep-1965 , 54 y.o.   MRN: [id number]   Chief Complaint  Patient presents with  . Annual Exam   The patient states she uses post menopausal status for birth control.  Mammogram last done 03/02/2018.  Negative for: breast discharge, breast lump(s), breast pain and breast self exam.  Pertinent negatives include abnormal bleeding (hematology), anxiety, decreased libido, depression, difficulty falling sleep, dyspareunia, history of infertility, nocturia, sexual dysfunction, sleep disturbances, urinary incontinence, urinary urgency, vaginal discharge and vaginal itching. Diet regular, avoiding fatty foods and is currently on day 9/21 day fast (religious). The patient states her exercise level is  none.      The patient's tobacco use is:  Social History   Tobacco Use  Smoking Status Never Smoker  Smokeless Tobacco Never Used   She has been exposed to passive smoke. The patient's alcohol use is:  Social History   Substance and Sexual Activity  Alcohol Use Yes   Additional information: Last pap November 2019 (Dr. Orene Desanctis), next one scheduled for 2022   HPI  Here for [facility]  Wt Readings from Last 3 Encounters: 02/01/19 : 154 lb 6.4 oz (70 kg) 01/05/19 : 159 lb (72.1 kg) 12/06/18 : 161 lb 6.4 oz (73.2 kg)   Diabetes She presents for her follow-up diabetic visit. Diabetes type: prediabetes. Her disease course has been stable. There are no hypoglycemic associated symptoms. Pertinent negatives for hypoglycemia include no dizziness or headaches. Pertinent negatives for diabetes include no blurred vision, no polydipsia, no polyphagia and no polyuria. There are no hypoglycemic complications. There are no diabetic complications. Risk factors for coronary artery disease include diabetes mellitus, obesity and sedentary lifestyle. Current diabetic treatment includes oral agent (monotherapy). She is compliant with treatment all of the time.  When asked about meal planning, she reported none. She has not had a previous visit with a dietitian. An ACE inhibitor/angiotensin II receptor blocker is being taken. She does not see a podiatrist.Eye exam is current.     Past Medical History:  Diagnosis Date  . Anxiety   . Hypertension      Family History  Problem Relation Age of Onset  . Breast cancer Maternal Grandmother   . Heart disease Mother   . Hypertension Mother   . Sarcoidosis Father   . Diabetes Maternal Grandfather      Current Outpatient Medications:  .  atorvastatin (LIPITOR) 10 MG tablet, Take 1 tablet (10 mg total) by mouth daily., Disp: 90 tablet, Rfl: 1 .  benazepril-hydrochlorthiazide (LOTENSIN HCT) 20-12.5 MG tablet, TAKE 1 TABLET BY MOUTH EVERY DAY, Disp: 90 tablet, Rfl: 1 .  Eszopiclone 3 MG TABS, Take 1 tablet (3 mg total) by mouth at bedtime. Take immediately before bedtime, Disp: 30 tablet, Rfl: 3 .  Insulin Pen Needle (NOVOFINE PLUS) 32G X 4 MM MISC, Inject 1 pen into the skin daily. Inject 1 pen with Victoza SQ, Disp: 100 each, Rfl: 3 .  levocetirizine (XYZAL) 5 MG tablet, Take 1 tablet (5 mg total) by mouth every evening., Disp: 30 tablet, Rfl: 2 .  valACYclovir (VALTREX) 1000 MG tablet, Take 1 tablet (1,000 mg total) by mouth daily. Patient taking it as needed, Disp: 90 tablet, Rfl: 1   No Known Allergies   Review of Systems  Constitutional: Negative.   HENT: Negative.   Eyes: Negative.  Negative for blurred vision.  Respiratory: Negative.   Cardiovascular: Negative.   Gastrointestinal: Positive  for constipation.  Endocrine: Negative.  Negative for polydipsia, polyphagia and polyuria.  Genitourinary: Negative.   Musculoskeletal: Negative.   Skin: Negative.   Allergic/Immunologic: Negative.   Neurological: Negative.  Negative for dizziness and headaches.  Hematological: Negative.   Psychiatric/Behavioral: Negative.      Today's Vitals   02/01/19 0936  BP: 110/70  Pulse: 75  Temp: 98.4 F  (36.9 C)  TempSrc: Oral  Weight: 154 lb 6.4 oz (70 kg)  Height: 5\' 6"  (1.676 m)  PainSc: 0-No pain   Body mass index is 24.92 kg/m.   Objective:  Physical Exam Constitutional:      Appearance: Normal appearance. She is well-developed.  HENT:     Head: Normocephalic and atraumatic.     Right Ear: Hearing, tympanic membrane, ear canal and external ear normal.     Left Ear: Hearing, tympanic membrane, ear canal and external ear normal.     Nose: Nose normal.     Mouth/Throat:     Mouth: Mucous membranes are moist.  Eyes:     General: Lids are normal.     Extraocular Movements: Extraocular movements intact.     Conjunctiva/sclera: Conjunctivae normal.     Pupils: Pupils are equal, round, and reactive to light.     Funduscopic exam:    Right eye: No papilledema.        Left eye: No papilledema.  Neck:     Thyroid: No thyroid mass.     Vascular: No carotid bruit.  Cardiovascular:     Rate and Rhythm: Normal rate and regular rhythm.     Pulses: Normal pulses.     Heart sounds: Normal heart sounds. No murmur.  Pulmonary:     Effort: Pulmonary effort is normal.     Breath sounds: Normal breath sounds.  Abdominal:     General: Bowel sounds are normal.     Palpations: Abdomen is soft.     Tenderness: There is no abdominal tenderness.  Musculoskeletal:        General: Normal range of motion.     Cervical back: Full passive range of motion without pain, normal range of motion and neck supple.  Skin:    General: Skin is warm and dry.     Capillary Refill: Capillary refill takes less than 2 seconds.  Neurological:     General: No focal deficit present.     Mental Status: She is alert and oriented to person, place, and time.     Cranial Nerves: No cranial nerve deficit.     Sensory: No sensory deficit.  Psychiatric:        Mood and Affect: Mood normal.        Behavior: Behavior normal.        Thought Content: Thought content normal.        Judgment: Judgment normal.         Assessment And Plan:     1. Routine general medical examination at a health care facility . Behavior modifications discussed and diet history reviewed.   . Pt will continue to exercise regularly and modify diet with low GI, plant based foods and decrease intake of processed foods.  . Recommend intake of daily multivitamin, Vitamin D, and calcium.  . Recommend mammogram for preventive screenings  2. Encounter for screening mammogram for breast cancer  Pt instructed on Self Breast Exam.According to ACOG guidelines Women aged 39 and older are recommended to get an annual mammogram. Form completed and given to patient contact  the [facility] for appointment scheduing.   Pt encouraged to get annual mammogram  3. Essential hypertension . B/P is controlled.  . CMP ordered to check renal function.  . The importance of regular exercise and dietary modification was stressed to the patient.  . Stressed importance of losing ten percent of her body weight to help with B/P control.  . The weight loss would help with decreasing cardiac and cancer risk as well.  . EKG done with NSR HR 68 - EKG 12-Lead - POCT Urinalysis Dipstick (81002) - POCT UA - Microalbumin  4. Prediabetes  Chronic, stable  Continue with current medications, she is doing well on Ozempic  Encouraged to limit intake of sugary foods and drinks  Encouraged to increase physical activity to 150 minutes per week - Hemoglobin A1c  5. Mixed hyperlipidemia  Chronic, controlled  Continue with current medications - Lipid Profile   [name], FNP

## 2019-02-02 LAB — CMP14+EGFR
ALT: 10 IU/L (ref 0–32)
AST: 15 IU/L (ref 0–40)
Albumin/Globulin Ratio: 2.1 (ref 1.2–2.2)
Albumin: 4.5 g/dL (ref 3.8–4.9)
Alkaline Phosphatase: 60 IU/L (ref 39–117)
BUN/Creatinine Ratio: 9 (ref 9–23)
BUN: 7 mg/dL (ref 6–24)
Bilirubin Total: 0.8 mg/dL (ref 0.0–1.2)
CO2: 25 mmol/L (ref 20–29)
Calcium: 9.7 mg/dL (ref 8.7–10.2)
Chloride: 97 mmol/L (ref 96–106)
Creatinine, Ser: 0.77 mg/dL (ref 0.57–1.00)
GFR calc Af Amer: 101 mL/min/{1.73_m2} (ref >59)
GFR calc non Af Amer: 87 mL/min/{1.73_m2} (ref >59)
Globulin, Total: 2.1 g/dL (ref 1.5–4.5)
Glucose: 78 mg/dL (ref 65–99)
Potassium: 4 mmol/L (ref 3.5–5.2)
Sodium: 139 mmol/L (ref 134–144)
Total Protein: 6.6 g/dL (ref 6.0–8.5)

## 2019-02-02 LAB — LIPID PANEL
Chol/HDL Ratio: 3.6 ratio (ref 0.0–4.4)
Cholesterol, Total: 149 mg/dL (ref 100–199)
HDL: 41 mg/dL (ref >39)
LDL Chol Calc (NIH): 94 mg/dL (ref 0–99)
Triglycerides: 71 mg/dL (ref 0–149)
VLDL Cholesterol Cal: 14 mg/dL (ref 5–40)

## 2019-02-02 LAB — HEMOGLOBIN A1C
Est. average glucose Bld gHb Est-mCnc: 103 mg/dL
Hgb A1c MFr Bld: 5.2 % (ref 4.8–5.6)

## 2019-02-09 NOTE — Patient Instructions (Signed)
Health Maintenance  Topic Date Due  . MAMMOGRAM  03/01/2020  . PAP SMEAR-Modifier  12/18/2020  . COLONOSCOPY  01/06/2024  . TETANUS/TDAP  11/02/2028  . INFLUENZA VACCINE  Completed  . Hepatitis C Screening  Completed  . HIV Screening  Completed   Health Maintenance, Female Adopting a healthy lifestyle and getting preventive care are important in promoting health and wellness. Ask your health care provider about:  The right schedule for you to have regular tests and exams.  Things you can do on your own to prevent diseases and keep yourself healthy. What should I know about diet, weight, and exercise? Eat a healthy diet   Eat a diet that includes plenty of vegetables, fruits, low-fat dairy products, and lean protein.  Do not eat a lot of foods that are high in solid fats, added sugars, or sodium. Maintain a healthy weight Body mass index (BMI) is used to identify weight problems. It estimates body fat based on height and weight. Your health care provider can help determine your BMI and help you achieve or maintain a healthy weight. Get regular exercise Get regular exercise. This is one of the most important things you can do for your health. Most adults should:  Exercise for at least 150 minutes each week. The exercise should increase your heart rate and make you sweat (moderate-intensity exercise).  Do strengthening exercises at least twice a week. This is in addition to the moderate-intensity exercise.  Spend less time sitting. Even light physical activity can be beneficial. Watch cholesterol and blood lipids Have your blood tested for lipids and cholesterol at 56 years of age, then have this test every 5 years. Have your cholesterol levels checked more often if:  Your lipid or cholesterol levels are high.  You are older than 56 years of age.  You are at high risk for heart disease. What should I know about cancer screening? Depending on your health history and family  history, you may need to have cancer screening at various ages. This may include screening for:  Breast cancer.  Cervical cancer.  Colorectal cancer.  Skin cancer.  Lung cancer. What should I know about heart disease, diabetes, and high blood pressure? Blood pressure and heart disease  High blood pressure causes heart disease and increases the risk of stroke. This is more likely to develop in people who have high blood pressure readings, are of African descent, or are overweight.  Have your blood pressure checked: ? Every 3-5 years if you are 61-35 years of age. ? Every year if you are 38 years old or older. Diabetes Have regular diabetes screenings. This checks your fasting blood sugar level. Have the screening done:  Once every three years after age 26 if you are at a normal weight and have a low risk for diabetes.  More often and at a younger age if you are overweight or have a high risk for diabetes. What should I know about preventing infection? Hepatitis B If you have a higher risk for hepatitis B, you should be screened for this virus. Talk with your health care provider to find out if you are at risk for hepatitis B infection. Hepatitis C Testing is recommended for:  Everyone born from 18 through 1965.  Anyone with known risk factors for hepatitis C. Sexually transmitted infections (STIs)  Get screened for STIs, including gonorrhea and chlamydia, if: ? You are sexually active and are younger than 56 years of age. ? You are older than  56 years of age and your health care provider tells you that you are at risk for this type of infection. ? Your sexual activity has changed since you were last screened, and you are at increased risk for chlamydia or gonorrhea. Ask your health care provider if you are at risk.  Ask your health care provider about whether you are at high risk for HIV. Your health care provider may recommend a prescription medicine to help prevent HIV  infection. If you choose to take medicine to prevent HIV, you should first get tested for HIV. You should then be tested every 3 months for as long as you are taking the medicine. Pregnancy  If you are about to stop having your period (premenopausal) and you may become pregnant, seek counseling before you get pregnant.  Take 400 to 800 micrograms (mcg) of folic acid every day if you become pregnant.  Ask for birth control (contraception) if you want to prevent pregnancy. Osteoporosis and menopause Osteoporosis is a disease in which the bones lose minerals and strength with aging. This can result in bone fractures. If you are 32 years old or older, or if you are at risk for osteoporosis and fractures, ask your health care provider if you should:  Be screened for bone loss.  Take a calcium or vitamin D supplement to lower your risk of fractures.  Be given hormone replacement therapy (HRT) to treat symptoms of menopause. Follow these instructions at home: Lifestyle  Do not use any products that contain nicotine or tobacco, such as cigarettes, e-cigarettes, and chewing tobacco. If you need help quitting, ask your health care provider.  Do not use street drugs.  Do not share needles.  Ask your health care provider for help if you need support or information about quitting drugs. Alcohol use  Do not drink alcohol if: ? Your health care provider tells you not to drink. ? You are pregnant, may be pregnant, or are planning to become pregnant.  If you drink alcohol: ? Limit how much you use to 0-1 drink a day. ? Limit intake if you are breastfeeding.  Be aware of how much alcohol is in your drink. In the [place] one drink equals one 12 oz bottle of beer (355 mL), one 5 oz glass of wine (148 mL), or one 1 oz glass of hard liquor (44 mL). General instructions  Schedule regular health, dental, and eye exams.  Stay current with your vaccines.  Tell your health care provider if: ? You  often feel depressed. ? You have ever been abused or do not feel safe at home. Summary  Adopting a healthy lifestyle and getting preventive care are important in promoting health and wellness.  Follow your health care provider's instructions about healthy diet, exercising, and getting tested or screened for diseases.  Follow your health care provider's instructions on monitoring your cholesterol and blood pressure. This information is not intended to replace advice given to you by your health care provider. Make sure you discuss any questions you have with your health care provider. Document Revised: 12/30/2017 Document Reviewed: 12/30/2017 Elsevier Patient Education  2020 [employer].

## 2019-02-22 ENCOUNTER — Other Ambulatory Visit: Payer: Self-pay | Admitting: Nurse Practitioner

## 2019-02-22 DIAGNOSIS — E782 Mixed hyperlipidemia: Secondary | ICD-10-CM

## 2019-03-24 ENCOUNTER — Ambulatory Visit: Payer: BC Managed Care – PPO | Attending: Internal Medicine

## 2019-03-24 ENCOUNTER — Other Ambulatory Visit: Payer: Self-pay | Admitting: Nurse Practitioner

## 2019-03-24 DIAGNOSIS — Z1231 Encounter for screening mammogram for malignant neoplasm of breast: Secondary | ICD-10-CM

## 2019-03-27 ENCOUNTER — Other Ambulatory Visit: Payer: Self-pay | Admitting: Nurse Practitioner

## 2019-03-27 DIAGNOSIS — J302 Other seasonal allergic rhinitis: Secondary | ICD-10-CM

## 2019-03-28 ENCOUNTER — Other Ambulatory Visit: Payer: Self-pay

## 2019-03-28 ENCOUNTER — Ambulatory Visit
Admission: RE | Admit: 2019-03-28 | Discharge: 2019-03-28 | Disposition: A | Discharge status: Home / Self Care | Payer: BC Managed Care – PPO | Source: Ambulatory Visit | Attending: Nurse Practitioner | Admitting: Nurse Practitioner

## 2019-03-28 DIAGNOSIS — Z1231 Encounter for screening mammogram for malignant neoplasm of breast: Secondary | ICD-10-CM

## 2019-04-05 ENCOUNTER — Other Ambulatory Visit: Payer: Self-pay | Admitting: Nurse Practitioner

## 2019-04-05 ENCOUNTER — Encounter: Payer: Self-pay | Admitting: Nurse Practitioner

## 2019-04-05 DIAGNOSIS — R7303 Prediabetes: Secondary | ICD-10-CM

## 2019-04-05 MED ORDER — OZEMPIC (0.25 OR 0.5 MG/DOSE) 2 MG/1.5ML ~~LOC~~ SOPN: 0.5000 mg | PEN_INJECTOR | SUBCUTANEOUS | 1 refills | Status: DC

## 2019-04-14 ENCOUNTER — Other Ambulatory Visit: Payer: Self-pay | Admitting: Nurse Practitioner

## 2019-04-27 ENCOUNTER — Ambulatory Visit: Payer: BC Managed Care – PPO | Admitting: Nurse Practitioner

## 2019-04-27 ENCOUNTER — Other Ambulatory Visit: Payer: Self-pay

## 2019-04-27 ENCOUNTER — Encounter: Payer: Self-pay | Admitting: Nurse Practitioner

## 2019-04-27 VITALS — BP 134/68 | HR 65 | Temp 98.0°F | Ht 66.0 in | Wt 147.0 lb

## 2019-04-27 DIAGNOSIS — M542 Cervicalgia: Secondary | ICD-10-CM | POA: Diagnosis not present

## 2019-04-27 DIAGNOSIS — R55 Syncope and collapse: Secondary | ICD-10-CM | POA: Diagnosis not present

## 2019-04-27 DIAGNOSIS — W06XXXA Fall from bed, initial encounter: Secondary | ICD-10-CM

## 2019-04-27 DIAGNOSIS — R634 Abnormal weight loss: Secondary | ICD-10-CM | POA: Diagnosis not present

## 2019-04-27 DIAGNOSIS — G44319 Acute post-traumatic headache, not intractable: Secondary | ICD-10-CM

## 2019-04-27 NOTE — Progress Notes (Signed)
This visit occurred during the SARS-CoV-2 public health emergency.  Safety protocols were in place, including screening questions prior to the visit, additional usage of staff PPE, and extensive cleaning of exam room while observing appropriate contact time as indicated for disinfecting solutions.  Subjective:     Patient ID: Alexandra [name] , female    DOB: 1965/06/22 , 54 y.o.   MRN: [id number]   Chief Complaint  Patient presents with  . 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 She had back of head pain and neck pain.  She reports being confused.  She did not go to urgent care or ER.    She has been walking and running more regularly - daily. She is eating healthy diet.  She will feel heart palpitations and feel like skipping a beat. She had a headache the same day. Took Ibuprofen, when bending over she will get dizziness.  She has been applying heat to the back of her neck. She has been taking CoQ10 gummies. She is scheduled to have the J&J shot.    Wt Readings from Last 3 Encounters: 04/27/19 : 147 lb (66.7 kg) 02/01/19 : 154 lb 6.4 oz (70 kg) 01/05/19 : 159 lb (72.1 kg)  She smoked cigarettes for 35 years off and on - quit smoking in 4 years.    Fall The accident occurred 5 to 7 days ago. The fall occurred from a bed. She fell from a height of 6 to 10 ft. Pertinent negatives include no headaches. She has tried nothing for the symptoms.     Past Medical History:  Diagnosis Date  . Anxiety   . Hypertension      Family History  Problem Relation Age of Onset  . Breast cancer Maternal Grandmother   . Heart disease Mother   . Hypertension Mother   . Sarcoidosis Father   . Diabetes Maternal Grandfather      Current Outpatient Medications:  .  atorvastatin (LIPITOR) 10 MG tablet, TAKE 1 TABLET BY MOUTH EVERY DAY, Disp: 90 tablet, Rfl:  1 .  benazepril-hydrochlorthiazide (LOTENSIN HCT) 20-12.5 MG tablet, TAKE 1 TABLET BY MOUTH EVERY DAY, Disp: 90 tablet, Rfl: 1 .  Eszopiclone 3 MG TABS, Take 1 tablet (3 mg total) by mouth at bedtime. Take immediately before bedtime, Disp: 30 tablet, Rfl: 3 .  Insulin Pen Needle (NOVOFINE PLUS) 32G X 4 MM MISC, Inject 1 pen into the skin daily. Inject 1 pen with Victoza SQ, Disp: 100 each, Rfl: 3 .  levocetirizine (XYZAL) 5 MG tablet, TAKE 1 TABLET BY MOUTH EVERY DAY IN THE EVENING, Disp: 30 tablet, Rfl: 2 .  OZEMPIC, 1 MG/DOSE, 2 MG/1.5ML SOPN, INJECT IM SUBQUTANEOUSLY ONCE WEEKLY (Patient taking differently: 0.5 mg. ), Disp: 3 mL, Rfl: 4 .  Semaglutide,0.25 or 0.5MG /DOS, (OZEMPIC, 0.25 OR 0.5 MG/DOSE,) 2 MG/1.5ML SOPN, Inject 0.5 mg into the skin once a week., Disp: 4.5 mL, Rfl: 1 .  valACYclovir (VALTREX) 1000 MG tablet, Take 1 tablet (1,000 mg total) by mouth daily. Patient taking it as needed, Disp: 90 tablet, Rfl: 1   No Known Allergies   Review of Systems  Constitutional: Negative.   HENT:       Neck pain   Respiratory: Negative.   Cardiovascular: Negative.   Neurological: Negative for dizziness and headaches.  Psychiatric/Behavioral: Negative.      Today's Vitals   04/27/19 1619  BP: 134/68  Pulse: 65  Temp: 98 F (36.7 C)  Weight: 147 lb (66.7 kg)  Height: 5\' 6"  (1.676 m)   Body mass index is 23.73 kg/m.   Objective:  Physical Exam Vitals reviewed.  Constitutional:      Appearance: Normal appearance.  Cardiovascular:     Rate and Rhythm: Normal rate and regular rhythm.  Skin:    General: Skin is warm.  Neurological:     General: No focal deficit present.     Mental Status: She is alert.         Assessment And Plan:     1. Syncope, unspecified syncope type  EKG done with NSR, nonspecific t abnormality essentially normal.   Will check thyroid levels and make sure she is not hypovolemic  Will check CT scan of head to evaluate for any bleeding - EKG  12-Lead - CBC - TSH  2. Abnormal weight loss  She is having continued weight loss   Will hold her ozempic as this does cause weight loss but since has been progressive even with a decrease in dose will refer to GI for further evaluation - Ambulatory referral to Gastroenterology  3. Acute post-traumatic headache, not intractable  Take tylenol as needed for headache  If worse return call to office - CT Head Wo Contrast; Future  4. Cervicalgia  Will check for any structural damage - DG Cervical Spine Complete; Future   [name], FNP    THE PATIENT IS ENCOURAGED TO PRACTICE SOCIAL DISTANCING DUE TO THE COVID-19 PANDEMIC.

## 2019-04-28 ENCOUNTER — Ambulatory Visit
Admission: RE | Admit: 2019-04-28 | Discharge: 2019-04-28 | Disposition: A | Discharge status: Home / Self Care | Payer: BC Managed Care – PPO | Source: Ambulatory Visit | Attending: Nurse Practitioner | Admitting: Nurse Practitioner

## 2019-04-28 DIAGNOSIS — M542 Cervicalgia: Secondary | ICD-10-CM

## 2019-04-28 LAB — CBC
Hematocrit: 36 % (ref 34.0–46.6)
Hemoglobin: 12.4 g/dL (ref 11.1–15.9)
MCH: 30.8 pg (ref 26.6–33.0)
MCHC: 34.4 g/dL (ref 31.5–35.7)
MCV: 89 fL (ref 79–97)
Platelets: 257 10*3/uL (ref 150–450)
RBC: 4.03 x10E6/uL (ref 3.77–5.28)
RDW: 13.8 % (ref 11.7–15.4)
WBC: 7.3 10*3/uL (ref 3.4–10.8)

## 2019-04-28 LAB — TSH: TSH: 1.17 u[IU]/mL (ref 0.450–4.500)

## 2019-05-11 ENCOUNTER — Other Ambulatory Visit: Payer: BC Managed Care – PPO

## 2019-05-14 ENCOUNTER — Encounter: Payer: Self-pay | Admitting: Nurse Practitioner

## 2019-06-01 ENCOUNTER — Encounter: Payer: Self-pay | Admitting: Nurse Practitioner

## 2019-06-01 ENCOUNTER — Ambulatory Visit: Payer: BC Managed Care – PPO | Admitting: Nurse Practitioner

## 2019-06-01 ENCOUNTER — Other Ambulatory Visit: Payer: Self-pay

## 2019-06-01 VITALS — BP 110/80 | HR 64 | Temp 98.0°F | Ht 66.0 in | Wt 155.0 lb

## 2019-06-01 DIAGNOSIS — R634 Abnormal weight loss: Secondary | ICD-10-CM

## 2019-06-01 DIAGNOSIS — I10 Essential (primary) hypertension: Secondary | ICD-10-CM

## 2019-06-01 DIAGNOSIS — R7303 Prediabetes: Secondary | ICD-10-CM | POA: Diagnosis not present

## 2019-06-01 DIAGNOSIS — E782 Mixed hyperlipidemia: Secondary | ICD-10-CM | POA: Diagnosis not present

## 2019-06-01 NOTE — Progress Notes (Signed)
Subjective:     Patient ID: Alexandra [name] , female    DOB: 1965/05/31 , 54 y.o.   MRN: [id number]   She has not been taking the Ozempic due to her weight loss. She has gained 7 lbs since stopping the medication.  She is also scheduled to have a CT scan of her head at the end of the week, but she is thinking about cancelling. She has not had any dizziness. She will take melatonin gummies for sleep and saves the [facility] for if this does not work.   Wt Readings from Last 3 Encounters: 06/01/19 : 155 lb (70.3 kg) 04/27/19 : 147 lb (66.7 kg) 02/01/19 : 154 lb 6.4 oz (70 kg)    Diabetes She presents for her follow-up diabetic visit. She has type 2 diabetes mellitus. No MedicAlert identification noted. Her disease course has been stable. Pertinent negatives for hypoglycemia include no dizziness or headaches. Pertinent negatives for diabetes include no blurred vision, no chest pain, no fatigue, no polydipsia, no polyphagia and no polyuria. Symptoms are stable. There are no diabetic complications. Risk factors for coronary artery disease include hypertension and diabetes mellitus. Current diabetic treatments: Was on Victoza until May, no insurance since then. She is compliant with treatment all of the time. She is following a generally healthy diet. When asked about meal planning, she reported none. She has not had a previous visit with a dietitian. An ACE inhibitor/angiotensin II receptor blocker is being taken. She does not see a podiatrist.Eye exam is not current.  Hypertension This is a chronic problem. The current episode started more than 1 year ago. The problem has been gradually worsening since onset. The problem is uncontrolled. Pertinent negatives include no anxiety, blurred vision, chest pain, headaches or palpitations. There are no associated agents to hypertension. Risk factors for coronary artery disease include obesity. Past treatments include diuretics and angiotensin blockers. The  current treatment provides no improvement. There are no compliance problems.  There is no history of angina. There is no history of chronic renal disease.     Past Medical History:  Diagnosis Date  . Anxiety   . Hypertension       Current Outpatient Medications:  .  atorvastatin (LIPITOR) 10 MG tablet, TAKE 1 TABLET BY MOUTH EVERY DAY, Disp: 90 tablet, Rfl: 1 .  benazepril-hydrochlorthiazide (LOTENSIN HCT) 20-12.5 MG tablet, TAKE 1 TABLET BY MOUTH EVERY DAY, Disp: 90 tablet, Rfl: 1 .  Eszopiclone 3 MG TABS, Take 1 tablet (3 mg total) by mouth at bedtime. Take immediately before bedtime, Disp: 30 tablet, Rfl: 3 .  Insulin Pen Needle (NOVOFINE PLUS) 32G X 4 MM MISC, Inject 1 pen into the skin daily. Inject 1 pen with Victoza SQ, Disp: 100 each, Rfl: 3 .  levocetirizine (XYZAL) 5 MG tablet, TAKE 1 TABLET BY MOUTH EVERY DAY IN THE EVENING, Disp: 30 tablet, Rfl: 2 .  valACYclovir (VALTREX) 1000 MG tablet, Take 1 tablet (1,000 mg total) by mouth daily. Patient taking it as needed, Disp: 90 tablet, Rfl: 1 .  Semaglutide,0.25 or 0.5MG /DOS, (OZEMPIC, 0.25 OR 0.5 MG/DOSE,) 2 MG/1.5ML SOPN, Inject 0.5 mg into the skin once a week. (Patient not taking: Reported on 06/01/2019), Disp: 4.5 mL, Rfl: 1   Review of Systems  Constitutional: Negative for fatigue.  Eyes: Negative for blurred vision.  Respiratory: Negative.   Cardiovascular: Negative.  Negative for chest pain, palpitations and leg swelling.  Endocrine: Negative for polydipsia, polyphagia and polyuria.  Skin: Negative.   Neurological:  Negative for dizziness and headaches.  Psychiatric/Behavioral: Negative.      Today's Vitals   06/01/19 0839  BP: 110/80  Pulse: 64  Temp: 98 F (36.7 C)  Weight: 155 lb (70.3 kg)  Height: 5\' 6"  (1.676 m)   Body mass index is 25.02 kg/m.   Objective:  Physical Exam Constitutional:      Appearance: She is well-developed.  Cardiovascular:     Rate and Rhythm: Normal rate and regular rhythm.      Heart sounds: Normal heart sounds.  Pulmonary:     Effort: Pulmonary effort is normal.     Breath sounds: Normal breath sounds.  Musculoskeletal:        General: Normal range of motion.  Skin:    General: Skin is warm and dry.     Capillary Refill: Capillary refill takes less than 2 seconds.  Neurological:     General: No focal deficit present.     Mental Status: She is alert and oriented to person, place, and time.  Psychiatric:        Mood and Affect: Mood normal.        Behavior: Behavior normal.        Thought Content: Thought content normal.        Judgment: Judgment normal.         Assessment And Plan:     1. Prediabetes  Chronic, stable.  Will restart her Ozempic at 0.25mg  weekly  2. Essential hypertension  Chronic, excellent control  Continue with current medications  3. Mixed hyperlipidemia  Chronic, controlled  Continue with current medications  4. Weight loss  She has gained back 7 lbs since stopping Ozempic  I do feel her weight loss was related to her Ozempic which is a side effect.    We will restart at a lower dose            [name], FNP

## 2019-06-02 LAB — CMP14+EGFR
ALT: 24 IU/L (ref 0–32)
AST: 24 IU/L (ref 0–40)
Albumin/Globulin Ratio: 2.3 — ABNORMAL HIGH (ref 1.2–2.2)
Albumin: 4.2 g/dL (ref 3.8–4.9)
Alkaline Phosphatase: 64 IU/L (ref 39–117)
BUN/Creatinine Ratio: 13 (ref 9–23)
BUN: 10 mg/dL (ref 6–24)
Bilirubin Total: 0.5 mg/dL (ref 0.0–1.2)
CO2: 25 mmol/L (ref 20–29)
Calcium: 9.4 mg/dL (ref 8.7–10.2)
Chloride: 104 mmol/L (ref 96–106)
Creatinine, Ser: 0.77 mg/dL (ref 0.57–1.00)
GFR calc Af Amer: 101 mL/min/{1.73_m2} (ref >59)
GFR calc non Af Amer: 87 mL/min/{1.73_m2} (ref >59)
Globulin, Total: 1.8 g/dL (ref 1.5–4.5)
Glucose: 92 mg/dL (ref 65–99)
Potassium: 4.4 mmol/L (ref 3.5–5.2)
Sodium: 144 mmol/L (ref 134–144)
Total Protein: 6 g/dL (ref 6.0–8.5)

## 2019-06-02 LAB — LIPID PANEL
Chol/HDL Ratio: 2.5 ratio (ref 0.0–4.4)
Cholesterol, Total: 148 mg/dL (ref 100–199)
HDL: 60 mg/dL (ref >39)
LDL Chol Calc (NIH): 76 mg/dL (ref 0–99)
Triglycerides: 58 mg/dL (ref 0–149)
VLDL Cholesterol Cal: 12 mg/dL (ref 5–40)

## 2019-06-02 LAB — HEMOGLOBIN A1C
Est. average glucose Bld gHb Est-mCnc: 103 mg/dL
Hgb A1c MFr Bld: 5.2 % (ref 4.8–5.6)

## 2019-06-03 ENCOUNTER — Other Ambulatory Visit: Payer: BC Managed Care – PPO

## 2019-06-14 ENCOUNTER — Other Ambulatory Visit: Payer: Self-pay | Admitting: Nurse Practitioner

## 2019-06-14 DIAGNOSIS — E782 Mixed hyperlipidemia: Secondary | ICD-10-CM

## 2019-06-28 ENCOUNTER — Other Ambulatory Visit: Payer: Self-pay

## 2019-06-28 DIAGNOSIS — R7303 Prediabetes: Secondary | ICD-10-CM

## 2019-06-28 MED ORDER — OZEMPIC (0.25 OR 0.5 MG/DOSE) 2 MG/1.5ML ~~LOC~~ SOPN: 0.5000 mg | PEN_INJECTOR | SUBCUTANEOUS | 0 refills | Status: DC

## 2019-07-01 ENCOUNTER — Other Ambulatory Visit: Payer: Self-pay | Admitting: Nurse Practitioner

## 2019-07-01 DIAGNOSIS — G4700 Insomnia, unspecified: Secondary | ICD-10-CM

## 2019-07-01 NOTE — Telephone Encounter (Signed)
Eszopiclone refill

## 2019-07-20 ENCOUNTER — Other Ambulatory Visit: Payer: Self-pay

## 2019-07-20 ENCOUNTER — Encounter: Payer: Self-pay | Admitting: Nurse Practitioner

## 2019-07-20 ENCOUNTER — Other Ambulatory Visit: Payer: Self-pay | Admitting: Nurse Practitioner

## 2019-07-20 ENCOUNTER — Ambulatory Visit: Payer: BC Managed Care – PPO | Admitting: Nurse Practitioner

## 2019-07-20 VITALS — BP 116/74 | HR 68 | Temp 97.9°F | Ht 65.8 in | Wt 156.8 lb

## 2019-07-20 DIAGNOSIS — R220 Localized swelling, mass and lump, head: Secondary | ICD-10-CM | POA: Diagnosis not present

## 2019-07-20 DIAGNOSIS — J302 Other seasonal allergic rhinitis: Secondary | ICD-10-CM

## 2019-07-20 MED ORDER — FLUCONAZOLE 100 MG PO TABS: 100.0000 mg | ORAL_TABLET | Freq: Every day | ORAL | 0 refills | Status: DC

## 2019-07-20 MED ORDER — CEPHALEXIN 500 MG PO CAPS: 500.0000 mg | ORAL_CAPSULE | Freq: Four times a day (QID) | ORAL | 0 refills | Status: AC

## 2019-07-20 NOTE — Progress Notes (Signed)
This visit occurred during the SARS-CoV-2 public health emergency.  Safety protocols were in place, including screening questions prior to the visit, additional usage of staff PPE, and extensive cleaning of exam room while observing appropriate contact time as indicated for disinfecting solutions.  Subjective:     Patient ID: Alexandra [name] , female    DOB: 1965/05/30 , 54 y.o.   MRN: [id number]   Chief Complaint  Patient presents with  . Mass    forehead area     HPI  Here today complaining with a lump on her head, she is unsure if is an insect bite has been present for 2 weeks. She hit her head on the washer machine over 3 weeks. Denies nausea, vomiting, or blurred vision. No other changes in vision.   Wt Readings from Last 3 Encounters: 07/20/19 : 156 lb 12.8 oz (71.1 kg) 06/01/19 : 155 lb (70.3 kg) 04/27/19 : 147 lb (66.7 kg)     Past Medical History:  Diagnosis Date  . Anxiety   . Hypertension      Family History  Problem Relation Age of Onset  . Breast cancer Maternal Grandmother   . Heart disease Mother   . Hypertension Mother   . Sarcoidosis Father   . Diabetes Maternal Grandfather      Current Outpatient Medications:  .  atorvastatin (LIPITOR) 10 MG tablet, TAKE 1 TABLET BY MOUTH EVERY DAY, Disp: 90 tablet, Rfl: 1 .  benazepril-hydrochlorthiazide (LOTENSIN HCT) 20-12.5 MG tablet, TAKE 1 TABLET BY MOUTH EVERY DAY, Disp: 90 tablet, Rfl: 1 .  Insulin Pen Needle (NOVOFINE PLUS) 32G X 4 MM MISC, Inject 1 pen into the skin daily. Inject 1 pen with Victoza SQ, Disp: 100 each, Rfl: 3 .  levocetirizine (XYZAL) 5 MG tablet, TAKE 1 TABLET BY MOUTH EVERY DAY IN THE EVENING, Disp: 30 tablet, Rfl: 2 .  Semaglutide,0.25 or 0.5MG /DOS, (OZEMPIC, 0.25 OR 0.5 MG/DOSE,) 2 MG/1.5ML SOPN, Inject 0.375 mLs (0.5 mg total) into the skin once a week., Disp: 3 pen, Rfl: 0 .  valACYclovir (VALTREX) 1000 MG tablet, Take 1 tablet (1,000 mg total) by mouth daily. Patient taking it as  needed, Disp: 90 tablet, Rfl: 1 .  Eszopiclone 3 MG TABS, TAKE 1 TABLET (3 MG TOTAL) BY MOUTH AT BEDTIME. TAKE IMMEDIATELY BEFORE BEDTIME, Disp: 30 tablet, Rfl: 5   No Known Allergies   Review of Systems  Constitutional: Negative.  Negative for fatigue.  HENT:       Lump to left side of head, tender to touch.  Respiratory: Negative.  Negative for cough.   Cardiovascular: Negative.  Negative for chest pain, palpitations and leg swelling.  Neurological: Negative for dizziness and headaches.  Psychiatric/Behavioral: Negative.      Today's Vitals   07/20/19 1025  BP: 116/74  Pulse: 68  Temp: 97.9 F (36.6 C)  TempSrc: Oral  Weight: 156 lb 12.8 oz (71.1 kg)  Height: 5' 5.8" (1.671 m)  PainSc: 0-No pain   Body mass index is 25.46 kg/m.   Objective:  Physical Exam Constitutional:      General: She is not in acute distress.    Appearance: Normal appearance.  Cardiovascular:     Rate and Rhythm: Normal rate and regular rhythm.     Pulses: Normal pulses.     Heart sounds: Normal heart sounds. No murmur heard.   Pulmonary:     Effort: Pulmonary effort is normal. No respiratory distress.     Breath sounds: Normal breath  sounds.  Skin:    General: Skin is warm and dry.     Capillary Refill: Capillary refill takes less than 2 seconds.     Comments: Soft, fluid filled lump to left forehead, slightly tender.   Neurological:     General: No focal deficit present.     Mental Status: She is alert and oriented to person, place, and time.     Cranial Nerves: No cranial nerve deficit.  Psychiatric:        Mood and Affect: Mood normal.        Behavior: Behavior normal.        Thought Content: Thought content normal.        Judgment: Judgment normal.         Assessment And Plan:     1. Localized swelling, mass and lump, head  Soft, slightly tender to bottom area  I will treat as an insect bite and have her to apply an ice pack 1-2 times a day to see if it gets better. If not  better return call to office. - cephALEXin (KEFLEX) 500 MG capsule; Take 1 capsule (500 mg total) by mouth 4 (four) times daily for 10 days.  Dispense: 40 capsule; Refill: 0    [name], FNP    THE PATIENT IS ENCOURAGED TO PRACTICE SOCIAL DISTANCING DUE TO THE COVID-19 PANDEMIC.

## 2019-07-20 NOTE — Patient Instructions (Signed)
   Apply ice to area 1-2 times per day  Take antibiotic until completely gone.

## 2019-08-08 ENCOUNTER — Encounter: Payer: Self-pay | Admitting: Nurse Practitioner

## 2019-08-11 ENCOUNTER — Other Ambulatory Visit: Payer: Self-pay | Admitting: Nurse Practitioner

## 2019-08-11 DIAGNOSIS — R220 Localized swelling, mass and lump, head: Secondary | ICD-10-CM

## 2019-09-28 ENCOUNTER — Other Ambulatory Visit (HOSPITAL_COMMUNITY): Payer: Self-pay | Admitting: Neurosurgery

## 2019-09-28 ENCOUNTER — Other Ambulatory Visit: Payer: Self-pay | Admitting: Neurosurgery

## 2019-09-28 DIAGNOSIS — D496 Neoplasm of unspecified behavior of brain: Secondary | ICD-10-CM

## 2019-09-30 ENCOUNTER — Other Ambulatory Visit: Payer: Self-pay | Admitting: Radiation Therapy

## 2019-10-03 ENCOUNTER — Other Ambulatory Visit: Payer: Self-pay | Admitting: Nurse Practitioner

## 2019-10-03 DIAGNOSIS — R7303 Prediabetes: Secondary | ICD-10-CM

## 2019-10-04 ENCOUNTER — Other Ambulatory Visit: Payer: Self-pay

## 2019-10-04 ENCOUNTER — Ambulatory Visit: Payer: BC Managed Care – PPO | Admitting: Nurse Practitioner

## 2019-10-04 ENCOUNTER — Encounter: Payer: Self-pay | Admitting: Nurse Practitioner

## 2019-10-04 VITALS — BP 110/70 | HR 63 | Temp 98.2°F | Ht 65.8 in | Wt 158.4 lb

## 2019-10-04 DIAGNOSIS — R7303 Prediabetes: Secondary | ICD-10-CM | POA: Diagnosis not present

## 2019-10-04 DIAGNOSIS — R220 Localized swelling, mass and lump, head: Secondary | ICD-10-CM

## 2019-10-04 DIAGNOSIS — G4700 Insomnia, unspecified: Secondary | ICD-10-CM | POA: Diagnosis not present

## 2019-10-04 DIAGNOSIS — Z23 Encounter for immunization: Secondary | ICD-10-CM

## 2019-10-04 DIAGNOSIS — I10 Essential (primary) hypertension: Secondary | ICD-10-CM | POA: Diagnosis not present

## 2019-10-04 LAB — CBC
Hematocrit: 36.1 % (ref 34.0–46.6)
Hemoglobin: 12 g/dL (ref 11.1–15.9)
MCH: 28.6 pg (ref 26.6–33.0)
MCHC: 33.2 g/dL (ref 31.5–35.7)
MCV: 86 fL (ref 79–97)
Platelets: 273 10*3/uL (ref 150–450)
RBC: 4.2 x10E6/uL (ref 3.77–5.28)
RDW: 14.8 % (ref 11.7–15.4)
WBC: 7.1 10*3/uL (ref 3.4–10.8)

## 2019-10-04 LAB — HEMOGLOBIN A1C
Est. average glucose Bld gHb Est-mCnc: 117 mg/dL
Hgb A1c MFr Bld: 5.7 % — ABNORMAL HIGH (ref 4.8–5.6)

## 2019-10-04 MED ORDER — VALACYCLOVIR HCL 1 G PO TABS
1000.0000 mg | ORAL_TABLET | Freq: Every day | ORAL | 1 refills | Status: DC
Start: 2019-10-04 — End: 2020-03-26

## 2019-10-04 MED ORDER — ESZOPICLONE 3 MG PO TABS: 3.0000 mg | ORAL_TABLET | Freq: Every day | ORAL | 5 refills | Status: DC

## 2019-10-04 NOTE — Progress Notes (Signed)
I,Yamilka Roman [employer] as a Education administrator for [employer], FNP.,have documented all relevant documentation on the behalf of [name], FNP,as directed by  [name], FNP while in the presence of [name], [place]. This visit occurred during the SARS-CoV-2 public health emergency.  Safety protocols were in place, including screening questions prior to the visit, additional usage of staff PPE, and extensive cleaning of exam room while observing appropriate contact time as indicated for disinfecting solutions.  Subjective:     Patient ID: Alexandra [name] , female    DOB: 1965/04/25 , 54 y.o.   MRN: [id number]   Chief Complaint  Patient presents with  . Hypertension    HPI  Patient here for a blood pressure f/u    Wt Readings from Last 3 Encounters: 07/20/19 : 156 lb 12.8 oz (71.1 kg) 06/01/19 : 155 lb (70.3 kg) 04/27/19 : 147 lb (66.7 kg)  Monday September 20th she is having surgery for possible brain tumor. May be out for 1 wk - 12 wk and her benefits run out October 15th.  She has children who are not vaccinated.   She was seen by Dr. Crissie Reese     Hypertension This is a chronic problem. The current episode started more than 1 year ago. The problem has been gradually worsening since onset. The problem is uncontrolled. Pertinent negatives include no anxiety, blurred vision, chest pain, headaches or palpitations. There are no associated agents to hypertension. Risk factors for coronary artery disease include obesity. Past treatments include diuretics and angiotensin blockers. The current treatment provides no improvement. There are no compliance problems.  There is no history of angina. There is no history of chronic renal disease.  Diabetes She presents for her follow-up diabetic visit. She has type 2 diabetes mellitus. No MedicAlert identification noted. Her disease course has been stable. Pertinent negatives for hypoglycemia include no dizziness or headaches. Pertinent  negatives for diabetes include no blurred vision, no chest pain, no fatigue, no polydipsia, no polyphagia and no polyuria. Symptoms are stable. There are no diabetic complications. Risk factors for coronary artery disease include hypertension and diabetes mellitus. Current diabetic treatments: Was on Victoza until May, no insurance since then. She is compliant with treatment all of the time. She is following a generally healthy diet. When asked about meal planning, she reported none. She has not had a previous visit with a dietitian. An ACE inhibitor/angiotensin II receptor blocker is being taken. She does not see a podiatrist.Eye exam is not current.     Past Medical History:  Diagnosis Date  . Anxiety   . Hypertension   . Pre-diabetes      Family History  Problem Relation Age of Onset  . Breast cancer Maternal Grandmother   . Heart disease Mother   . Hypertension Mother   . Sarcoidosis Father   . Diabetes Maternal Grandfather      Current Outpatient Medications:  .  atorvastatin (LIPITOR) 10 MG tablet, TAKE 1 TABLET BY MOUTH EVERY DAY (Patient taking differently: Take 10 mg by mouth daily. ), Disp: 90 tablet, Rfl: 1 .  benazepril-hydrochlorthiazide (LOTENSIN HCT) 20-12.5 MG tablet, TAKE 1 TABLET BY MOUTH EVERY DAY (Patient taking differently: Take 0.5 tablets by mouth daily. ), Disp: 90 tablet, Rfl: 1 .  Eszopiclone 3 MG TABS, Take 1 tablet (3 mg total) by mouth at bedtime. Take immediately before bedtime, Disp: 30 tablet, Rfl: 5 .  Insulin Pen Needle (NOVOFINE PLUS) 32G X 4 MM MISC, Inject 1 pen  into the skin daily. Inject 1 pen with Victoza SQ, Disp: 100 each, Rfl: 3 .  levocetirizine (XYZAL) 5 MG tablet, TAKE 1 TABLET BY MOUTH EVERY DAY IN THE EVENING (Patient taking differently: Take 5 mg by mouth every evening. ), Disp: 30 tablet, Rfl: 2 .  valACYclovir (VALTREX) 1000 MG tablet, Take 1 tablet (1,000 mg total) by mouth daily. Patient taking it as needed, Disp: 90 tablet, Rfl: 1 .   oxyCODONE-acetaminophen (PERCOCET) 5-325 MG tablet, Take 1 tablet by mouth every 4 (four) hours as needed for severe pain., Disp: 30 tablet, Rfl: 0 .  oxyCODONE-acetaminophen (PERCOCET) 5-325 MG tablet, Take 1 tablet by mouth every 4 (four) hours as needed for severe pain., Disp: 30 tablet, Rfl: 0 .  OZEMPIC, 0.25 OR 0.5 MG/DOSE, 2 MG/1.5ML SOPN, INJECT 0.5 MG TOTAL INTO THE SKIN ONCE A WEEK., Disp: 4.5 mL, Rfl: 1   No Known Allergies   Review of Systems  Constitutional: Negative for fatigue.  Eyes: Negative for blurred vision.  Cardiovascular: Negative for chest pain and palpitations.  Endocrine: Negative for polydipsia, polyphagia and polyuria.  Neurological: Negative for dizziness and headaches.     Today's Vitals   10/04/19 0841  BP: 110/70  Pulse: 63  Temp: 98.2 F (36.8 C)  TempSrc: Oral  Weight: 158 lb 6.4 oz (71.8 kg)  Height: 5' 5.8" (1.671 m)  PainSc: 2   PainLoc: Head   Body mass index is 25.72 kg/m.   Objective:  Physical Exam Constitutional:      General: She is not in acute distress. HENT:     Head:     Comments: She has a soft mass to her left forehead with a healed incision, also has part of her skull removed from the top of her left head.   Cardiovascular:     Rate and Rhythm: Normal rate and regular rhythm.     Pulses: Normal pulses.     Heart sounds: Normal heart sounds. No murmur heard.   Pulmonary:     Effort: Pulmonary effort is normal. No respiratory distress.     Breath sounds: Normal breath sounds.  Skin:    Capillary Refill: Capillary refill takes less than 2 seconds.  Neurological:     General: No focal deficit present.     Mental Status: She is alert and oriented to person, place, and time.     Cranial Nerves: No cranial nerve deficit.  Psychiatric:        Mood and Affect: Mood normal.        Behavior: Behavior normal.        Thought Content: Thought content normal.        Judgment: Judgment normal.         Assessment And Plan:      1. Essential hypertension . B/P is well controlled. .  . Focus on low salt diet  2. Localized swelling, mass and lump, head  Left side of head has larger mass, she is currently awaiting surgery for a mass removal and possible craniotomy. They are in the process of identifying the type of mass and whether is cancer.  - CBC  3. Prediabetes  Chronic, she is well controlled and tolerating Ozempic well - Hemoglobin A1c  4. Insomnia, unspecified type  Chronic, with the changes in her health recently continues to have difficulty with sleep. - Eszopiclone 3 MG TABS; Take 1 tablet (3 mg total) by mouth at bedtime. Take immediately before bedtime  Dispense: 30 tablet; Refill:  5  5. Need for influenza vaccination  Influenza vaccine administered  Encouraged to take Tylenol as needed for fever or muscle aches.     Patient was given opportunity to ask questions. Patient verbalized understanding of the plan and was able to repeat key elements of the plan. All questions were answered to their satisfaction.   Teola Bradley, FNP, have reviewed all documentation for this visit. The documentation on 10/18/19 for the exam, diagnosis, procedures, and orders are all accurate and complete.  THE PATIENT IS ENCOURAGED TO PRACTICE SOCIAL DISTANCING DUE TO THE COVID-19 PANDEMIC.

## 2019-10-04 NOTE — Patient Instructions (Signed)

## 2019-10-05 ENCOUNTER — Ambulatory Visit (HOSPITAL_COMMUNITY)
Admission: RE | Admit: 2019-10-05 | Discharge: 2019-10-05 | Disposition: A | Discharge status: Home / Self Care | Payer: BC Managed Care – PPO | Source: Ambulatory Visit | Attending: Neurosurgery | Admitting: Neurosurgery

## 2019-10-05 DIAGNOSIS — D496 Neoplasm of unspecified behavior of brain: Secondary | ICD-10-CM | POA: Insufficient documentation

## 2019-10-05 MED ORDER — GADOBUTROL 1 MMOL/ML IV SOLN
7.0000 mL | Freq: Once | INTRAVENOUS | Status: AC | PRN
  Administered 2019-10-05: 7 mL via INTRAVENOUS

## 2019-10-05 NOTE — Progress Notes (Signed)
[id number] [address], [place] [address] Phone: [phone] Fax: [phone]  [id number] [place], [place] [address] [place] [address] Phone: [phone] Fax: [phone]      Your procedure is scheduled on September 20  Report to [facility] Main Entrance "A" at 0530 A.M., and check in at the Admitting office.  Call this number if you have problems the morning of surgery:  [phone]  Call [phone] if you have any questions prior to your surgery date Monday-Friday 8am-4pm    Remember:  Do not eat or after midnight the night before your surgery     Take these medicines the morning of surgery with A SIP OF WATER  atorvastatin (LIPITOR) valACYclovir (VALTREX) if needed   As of today, STOP taking any Aspirin (unless otherwise instructed by your surgeon) Aleve, Naproxen, Ibuprofen, Motrin, Advil, Goody's, BC's, all herbal medications, fish oil, and all vitamins.           WHAT DO I DO ABOUT MY DIABETES MEDICATION?   Marland Kitchen Do not take oral diabetes medicines (pills) the morning of surgery.   . The day of surgery, do not take other diabetes injectables, including Byetta (exenatide), Bydureon (exenatide ER), Victoza (liraglutide), or Trulicity (dulaglutide).  . If your CBG is greater than 220 mg/dL, you may take  of your sliding scale (correction) dose of insulin.   HOW TO MANAGE YOUR DIABETES BEFORE AND AFTER SURGERY  Why is it important to control my blood sugar before and after surgery? . Improving blood sugar levels before and after surgery helps healing and can limit problems. . A way of improving blood sugar control is eating a healthy diet by: o  Eating less sugar and carbohydrates o  Increasing activity/exercise o  Talking with your doctor about reaching your blood sugar goals . High blood sugars (greater than  180 mg/dL) can raise your risk of infections and slow your recovery, so you will need to focus on controlling your diabetes during the weeks before surgery. . Make sure that the doctor who takes care of your diabetes knows about your planned surgery including the date and location.  How do I manage my blood sugar before surgery? . Check your blood sugar at least 4 times a day, starting 2 days before surgery, to make sure that the level is not too high or low. . Check your blood sugar the morning of your surgery when you wake up and every 2 hours until you get to the Short Stay unit. o If your blood sugar is less than 70 mg/dL, you will need to treat for low blood sugar: - Do not take insulin. - Treat a low blood sugar (less than 70 mg/dL) with  cup of clear juice (cranberry or apple), 4 glucose tablets, OR glucose gel. - Recheck blood sugar in 15 minutes after treatment (to make sure it is greater than 70 mg/dL). If your blood sugar is not greater than 70 mg/dL on recheck, call [phone] for further instructions. . Report your blood sugar to the short stay nurse when you get to Short Stay.  . If you are admitted to the hospital after surgery: o Your blood sugar will be checked by the staff and you will probably be given insulin after surgery (instead of oral diabetes medicines) to make sure you have good blood sugar levels. o The goal for  blood sugar control after surgery is 80-180 mg/dL.             Do not wear jewelry, make up, or nail polish            Do not wear lotions, powders, perfumes/colognes, or deodorant.            Do not shave 48 hours prior to surgery.  Men may shave face and neck.            Do not bring valuables to the hospital.            [facility] is not responsible for any belongings or valuables.  Do NOT Smoke (Tobacco/Vaping) or drink Alcohol 24 hours prior to your procedure If you use a CPAP at night, you may bring all equipment for your overnight stay.    Contacts, glasses, dentures or bridgework may not be worn into surgery.      For patients admitted to the hospital, discharge time will be determined by your treatment team.   Patients discharged the day of surgery will not be allowed to drive home, and someone needs to stay with them for 24 hours.    Special instructions:   - Preparing For Surgery  Before surgery, you can play an important role. Because skin is not sterile, your skin needs to be as free of germs as possible. You can reduce the number of germs on your skin by washing with CHG (chlorahexidine gluconate) Soap before surgery.  CHG is an antiseptic cleaner which kills germs and bonds with the skin to continue killing germs even after washing.    Oral Hygiene is also important to reduce your risk of infection.  Remember - BRUSH YOUR TEETH THE MORNING OF SURGERY WITH YOUR REGULAR TOOTHPASTE  Please do not use if you have an allergy to CHG or antibacterial soaps. If your skin becomes reddened/irritated stop using the CHG.  Do not shave (including legs and underarms) for at least 48 hours prior to first CHG shower. It is OK to shave your face.  Please follow these instructions carefully.   1. Shower the NIGHT BEFORE SURGERY and the MORNING OF SURGERY with CHG Soap.   2. If you chose to wash your hair, wash your hair first as usual with your normal shampoo.  3. After you shampoo, rinse your hair and body thoroughly to remove the shampoo.  4. Use CHG as you would any other liquid soap. You can apply CHG directly to the skin and wash gently with a scrungie or a clean washcloth.   5. Apply the CHG Soap to your body ONLY FROM THE NECK DOWN.  Do not use on open wounds or open sores. Avoid contact with your eyes, ears, mouth and genitals (private parts). Wash Face and genitals (private parts)  with your normal soap.   6. Wash thoroughly, paying special attention to the area where your surgery will be  performed.  7. Thoroughly rinse your body with warm water from the neck down.  8. DO NOT shower/wash with your normal soap after using and rinsing off the CHG Soap.  9. Pat yourself dry with a CLEAN TOWEL.  10. Wear CLEAN PAJAMAS to bed the night before surgery  11. Place CLEAN SHEETS on your bed the night of your first shower and DO NOT SLEEP WITH PETS.   Day of Surgery: Wear Clean/Comfortable clothing the morning of surgery Do not apply any deodorants/lotions.   Remember to brush your  teeth WITH YOUR REGULAR TOOTHPASTE.   Please read over the following fact sheets that you were given.

## 2019-10-06 ENCOUNTER — Encounter (HOSPITAL_COMMUNITY)
Admission: RE | Admit: 2019-10-06 | Discharge: 2019-10-06 | Disposition: A | Discharge status: Home / Self Care | Payer: BC Managed Care – PPO | Source: Ambulatory Visit | Attending: Neurosurgery | Admitting: Neurosurgery

## 2019-10-06 ENCOUNTER — Other Ambulatory Visit: Payer: Self-pay

## 2019-10-06 ENCOUNTER — Encounter (HOSPITAL_COMMUNITY): Payer: Self-pay

## 2019-10-06 ENCOUNTER — Other Ambulatory Visit (HOSPITAL_COMMUNITY): Payer: BC Managed Care – PPO

## 2019-10-06 DIAGNOSIS — Z01812 Encounter for preprocedural laboratory examination: Secondary | ICD-10-CM | POA: Diagnosis present

## 2019-10-06 HISTORY — DX: Prediabetes: R73.03

## 2019-10-06 LAB — BASIC METABOLIC PANEL
Anion gap: 12 (ref 5–15)
BUN: 21 mg/dL — ABNORMAL HIGH (ref 6–20)
CO2: 26 mmol/L (ref 22–32)
Calcium: 9.5 mg/dL (ref 8.9–10.3)
Chloride: 106 mmol/L (ref 98–111)
Creatinine, Ser: 0.8 mg/dL (ref 0.44–1.00)
GFR calc Af Amer: >60 mL/min (ref >60)
GFR calc non Af Amer: >60 mL/min (ref >60)
Glucose, Bld: 78 mg/dL (ref 70–99)
Potassium: 4 mmol/L (ref 3.5–5.1)
Sodium: 144 mmol/L (ref 135–145)

## 2019-10-06 LAB — GLUCOSE, CAPILLARY: Glucose-Capillary: 80 mg/dL (ref 70–99)

## 2019-10-06 NOTE — Progress Notes (Signed)
PCP - [name] ([facility]) Cardiologist - denies  Chest x-ray - n/a EKG - 04-27-19  Pre-diabetic Per patient, does not check sugars at home   COVID TEST- Saturday 10-08-19   Anesthesia review: n/a  Patient denies shortness of breath, fever, cough and chest pain at PAT appointment   All instructions explained to the patient, with a verbal understanding of the material. Patient agrees to go over the instructions while at home for a better understanding. Patient also instructed to self quarantine after being tested for COVID-19. The opportunity to ask questions was provided.

## 2019-10-08 ENCOUNTER — Other Ambulatory Visit (HOSPITAL_COMMUNITY)
Admission: RE | Admit: 2019-10-08 | Discharge: 2019-10-08 | Disposition: A | Discharge status: Home / Self Care | Payer: BC Managed Care – PPO | Source: Ambulatory Visit | Attending: Neurosurgery | Admitting: Neurosurgery

## 2019-10-08 DIAGNOSIS — Z20822 Contact with and (suspected) exposure to covid-19: Secondary | ICD-10-CM | POA: Insufficient documentation

## 2019-10-08 DIAGNOSIS — Z01812 Encounter for preprocedural laboratory examination: Secondary | ICD-10-CM | POA: Insufficient documentation

## 2019-10-08 LAB — SARS CORONAVIRUS 2 (TAT 6-24 HRS): SARS Coronavirus 2: NEGATIVE

## 2019-10-10 ENCOUNTER — Encounter (HOSPITAL_COMMUNITY)
Admission: RE | Disposition: A | Discharge status: Home / Self Care | Payer: Self-pay | Source: Home / Self Care | Attending: Neurosurgery

## 2019-10-10 ENCOUNTER — Inpatient Hospital Stay (HOSPITAL_COMMUNITY): Payer: BC Managed Care – PPO | Admitting: Certified Registered Nurse Anesthetist

## 2019-10-10 ENCOUNTER — Inpatient Hospital Stay (HOSPITAL_COMMUNITY): Payer: BC Managed Care – PPO

## 2019-10-10 ENCOUNTER — Other Ambulatory Visit: Payer: Self-pay

## 2019-10-10 ENCOUNTER — Inpatient Hospital Stay (HOSPITAL_COMMUNITY)
Admission: RE | Admit: 2019-10-10 | Discharge: 2019-10-15 | DRG: 822 | Disposition: A | Discharge status: Home / Self Care | Payer: BC Managed Care – PPO | Attending: Neurosurgery | Admitting: Neurosurgery

## 2019-10-10 ENCOUNTER — Encounter (HOSPITAL_COMMUNITY): Payer: Self-pay | Admitting: Neurosurgery

## 2019-10-10 ENCOUNTER — Inpatient Hospital Stay (HOSPITAL_COMMUNITY): Payer: BC Managed Care – PPO | Admitting: Vascular Surgery

## 2019-10-10 DIAGNOSIS — Z8249 Family history of ischemic heart disease and other diseases of the circulatory system: Secondary | ICD-10-CM

## 2019-10-10 DIAGNOSIS — E782 Mixed hyperlipidemia: Secondary | ICD-10-CM | POA: Diagnosis present

## 2019-10-10 DIAGNOSIS — I10 Essential (primary) hypertension: Secondary | ICD-10-CM | POA: Diagnosis present

## 2019-10-10 DIAGNOSIS — Z803 Family history of malignant neoplasm of breast: Secondary | ICD-10-CM

## 2019-10-10 DIAGNOSIS — C9030 Solitary plasmacytoma not having achieved remission: Principal | ICD-10-CM | POA: Diagnosis present

## 2019-10-10 DIAGNOSIS — Z833 Family history of diabetes mellitus: Secondary | ICD-10-CM | POA: Diagnosis not present

## 2019-10-10 DIAGNOSIS — Z20822 Contact with and (suspected) exposure to covid-19: Secondary | ICD-10-CM | POA: Diagnosis present

## 2019-10-10 DIAGNOSIS — Z23 Encounter for immunization: Secondary | ICD-10-CM

## 2019-10-10 DIAGNOSIS — R7303 Prediabetes: Secondary | ICD-10-CM | POA: Diagnosis present

## 2019-10-10 DIAGNOSIS — D496 Neoplasm of unspecified behavior of brain: Secondary | ICD-10-CM | POA: Diagnosis present

## 2019-10-10 HISTORY — PX: CRANIOTOMY: SHX93

## 2019-10-10 HISTORY — PX: APPLICATION OF CRANIAL NAVIGATION: SHX6578

## 2019-10-10 LAB — POCT I-STAT 7, (LYTES, BLD GAS, ICA,H+H)
Acid-Base Excess: 3 mmol/L — ABNORMAL HIGH (ref 0.0–2.0)
Acid-Base Excess: 1 mmol/L (ref 0.0–2.0)
Bicarbonate: 26.5 mmol/L (ref 20.0–28.0)
Bicarbonate: 24.1 mmol/L (ref 20.0–28.0)
Calcium, Ion: 1.17 mmol/L (ref 1.15–1.40)
Calcium, Ion: 1.11 mmol/L — ABNORMAL LOW (ref 1.15–1.40)
HCT: 29 % — ABNORMAL LOW (ref 36.0–46.0)
HCT: 23 % — ABNORMAL LOW (ref 36.0–46.0)
Hemoglobin: 9.9 g/dL — ABNORMAL LOW (ref 12.0–15.0)
Hemoglobin: 7.8 g/dL — ABNORMAL LOW (ref 12.0–15.0)
O2 Saturation: 100 %
O2 Saturation: 100 %
Potassium: 3.6 mmol/L (ref 3.5–5.1)
Potassium: 3.4 mmol/L — ABNORMAL LOW (ref 3.5–5.1)
Sodium: 142 mmol/L (ref 135–145)
Sodium: 143 mmol/L (ref 135–145)
TCO2: 28 mmol/L (ref 22–32)
TCO2: 25 mmol/L (ref 22–32)
pCO2 arterial: 37.6 mmHg (ref 32.0–48.0)
pCO2 arterial: 30.8 mmHg — ABNORMAL LOW (ref 32.0–48.0)
pH, Arterial: 7.456 — ABNORMAL HIGH (ref 7.350–7.450)
pH, Arterial: 7.502 — ABNORMAL HIGH (ref 7.350–7.450)
pO2, Arterial: 341 mmHg — ABNORMAL HIGH (ref 83.0–108.0)
pO2, Arterial: 301 mmHg — ABNORMAL HIGH (ref 83.0–108.0)

## 2019-10-10 LAB — PREPARE RBC (CROSSMATCH)

## 2019-10-10 LAB — GLUCOSE, CAPILLARY
Glucose-Capillary: 76 mg/dL (ref 70–99)
Glucose-Capillary: 137 mg/dL — ABNORMAL HIGH (ref 70–99)
Glucose-Capillary: 127 mg/dL — ABNORMAL HIGH (ref 70–99)

## 2019-10-10 LAB — MRSA PCR SCREENING: MRSA by PCR: NEGATIVE

## 2019-10-10 LAB — ABO/RH: ABO/RH(D): O POS

## 2019-10-10 SURGERY — CRANIOTOMY TUMOR EXCISION
Anesthesia: General | Site: Head

## 2019-10-10 MED ORDER — ALBUMIN HUMAN 5 % IV SOLN: INTRAVENOUS | Status: DC | PRN

## 2019-10-10 MED ORDER — ONDANSETRON HCL 4 MG/2ML IJ SOLN: 4.0000 mg | Freq: Once | INTRAMUSCULAR | Status: DC | PRN

## 2019-10-10 MED ORDER — BUPIVACAINE HCL (PF) 0.5 % IJ SOLN
INTRAMUSCULAR | Status: AC
  Filled 2019-10-10: qty 30

## 2019-10-10 MED ORDER — PHENYLEPHRINE HCL (PRESSORS) 10 MG/ML IV SOLN
INTRAVENOUS | Status: DC | PRN
  Administered 2019-10-10 (×3): 80 ug via INTRAVENOUS

## 2019-10-10 MED ORDER — HYDROCODONE-ACETAMINOPHEN 5-325 MG PO TABS
ORAL_TABLET | ORAL | Status: AC
Start: 2019-10-10 — End: 2019-10-11
  Filled 2019-10-10: qty 1

## 2019-10-10 MED ORDER — SODIUM CHLORIDE 0.9 % IV SOLN: INTRAVENOUS | Status: DC | PRN

## 2019-10-10 MED ORDER — BENAZEPRIL-HYDROCHLOROTHIAZIDE 20-12.5 MG PO TABS: 0.5000 | ORAL_TABLET | Freq: Every day | ORAL | Status: DC

## 2019-10-10 MED ORDER — SODIUM CHLORIDE 0.9% IV SOLUTION: Freq: Once | INTRAVENOUS | Status: DC

## 2019-10-10 MED ORDER — LORATADINE 10 MG PO TABS
10.0000 mg | ORAL_TABLET | Freq: Every evening | ORAL | Status: DC
  Administered 2019-10-11 – 2019-10-14 (×4): 10 mg via ORAL
  Filled 2019-10-10 (×4): qty 1

## 2019-10-10 MED ORDER — HYDROCHLOROTHIAZIDE 10 MG/ML ORAL SUSPENSION
6.2500 mg | Freq: Every day | ORAL | Status: DC
  Filled 2019-10-10 (×7): qty 1.25

## 2019-10-10 MED ORDER — CEFAZOLIN SODIUM-DEXTROSE 2-4 GM/100ML-% IV SOLN: 2.0000 g | INTRAVENOUS | Status: DC

## 2019-10-10 MED ORDER — INFLUENZA VAC SPLIT QUAD 0.5 ML IM SUSY
0.5000 mL | PREFILLED_SYRINGE | INTRAMUSCULAR | Status: AC
  Administered 2019-10-11: 0.5 mL via INTRAMUSCULAR
  Filled 2019-10-10: qty 0.5

## 2019-10-10 MED ORDER — HYDROMORPHONE HCL 1 MG/ML IJ SOLN
0.2500 mg | INTRAMUSCULAR | Status: DC | PRN
  Administered 2019-10-10 (×3): 0.5 mg via INTRAVENOUS

## 2019-10-10 MED ORDER — PROMETHAZINE HCL 25 MG PO TABS: 12.5000 mg | ORAL_TABLET | ORAL | Status: DC | PRN

## 2019-10-10 MED ORDER — FENTANYL CITRATE (PF) 250 MCG/5ML IJ SOLN
INTRAMUSCULAR | Status: DC | PRN
Start: 2019-10-10 — End: 2019-10-10
  Administered 2019-10-10: 50 ug via INTRAVENOUS
  Administered 2019-10-10: 100 ug via INTRAVENOUS
  Administered 2019-10-10 (×2): 50 ug via INTRAVENOUS

## 2019-10-10 MED ORDER — BUPIVACAINE HCL (PF) 0.5 % IJ SOLN
INTRAMUSCULAR | Status: DC | PRN
  Administered 2019-10-10: 8 mL

## 2019-10-10 MED ORDER — ORAL CARE MOUTH RINSE: 15.0000 mL | Freq: Once | OROMUCOSAL | Status: AC

## 2019-10-10 MED ORDER — LEVETIRACETAM IN NACL 500 MG/100ML IV SOLN
500.0000 mg | Freq: Two times a day (BID) | INTRAVENOUS | Status: DC
  Administered 2019-10-10: 500 mg via INTRAVENOUS
  Filled 2019-10-10 (×2): qty 100

## 2019-10-10 MED ORDER — ACETAMINOPHEN 325 MG PO TABS
ORAL_TABLET | ORAL | Status: AC
  Filled 2019-10-10: qty 2

## 2019-10-10 MED ORDER — MIDAZOLAM HCL 2 MG/2ML IJ SOLN
INTRAMUSCULAR | Status: AC
  Filled 2019-10-10: qty 2

## 2019-10-10 MED ORDER — ZOLPIDEM TARTRATE 5 MG PO TABS
5.0000 mg | ORAL_TABLET | Freq: Every evening | ORAL | Status: DC | PRN
  Filled 2019-10-10: qty 1

## 2019-10-10 MED ORDER — BENAZEPRIL HCL 5 MG PO TABS
10.0000 mg | ORAL_TABLET | Freq: Every day | ORAL | Status: DC
  Administered 2019-10-15: 10 mg via ORAL
  Filled 2019-10-10 (×6): qty 2

## 2019-10-10 MED ORDER — ROCURONIUM BROMIDE 10 MG/ML (PF) SYRINGE
PREFILLED_SYRINGE | INTRAVENOUS | Status: AC
  Filled 2019-10-10: qty 10

## 2019-10-10 MED ORDER — ATORVASTATIN CALCIUM 10 MG PO TABS
10.0000 mg | ORAL_TABLET | Freq: Every day | ORAL | Status: DC
  Administered 2019-10-11 – 2019-10-15 (×4): 10 mg via ORAL
  Filled 2019-10-10 (×4): qty 1

## 2019-10-10 MED ORDER — LACTATED RINGERS IV SOLN: INTRAVENOUS | Status: DC

## 2019-10-10 MED ORDER — PROPOFOL 10 MG/ML IV BOLUS
INTRAVENOUS | Status: AC
  Filled 2019-10-10: qty 40

## 2019-10-10 MED ORDER — HYDROCODONE-ACETAMINOPHEN 5-325 MG PO TABS
1.0000 | ORAL_TABLET | ORAL | Status: DC | PRN
  Administered 2019-10-10 – 2019-10-13 (×3): 1 via ORAL
  Filled 2019-10-10 (×2): qty 1

## 2019-10-10 MED ORDER — INSULIN ASPART 100 UNIT/ML ~~LOC~~ SOLN: 0.0000 [IU] | Freq: Every day | SUBCUTANEOUS | Status: DC

## 2019-10-10 MED ORDER — ROCURONIUM BROMIDE 10 MG/ML (PF) SYRINGE
PREFILLED_SYRINGE | INTRAVENOUS | Status: DC | PRN
  Administered 2019-10-10: 20 mg via INTRAVENOUS
  Administered 2019-10-10: 50 mg via INTRAVENOUS
  Administered 2019-10-10 (×2): 20 mg via INTRAVENOUS

## 2019-10-10 MED ORDER — SODIUM CHLORIDE 0.9 % IV SOLN: INTRAVENOUS | Status: DC

## 2019-10-10 MED ORDER — NALOXONE HCL 0.4 MG/ML IJ SOLN: 0.0800 mg | INTRAMUSCULAR | Status: DC | PRN

## 2019-10-10 MED ORDER — CEFAZOLIN SODIUM-DEXTROSE 2-4 GM/100ML-% IV SOLN
2.0000 g | INTRAVENOUS | Status: DC
  Filled 2019-10-10: qty 100

## 2019-10-10 MED ORDER — CHLORHEXIDINE GLUCONATE CLOTH 2 % EX PADS: 6.0000 | MEDICATED_PAD | Freq: Once | CUTANEOUS | Status: DC

## 2019-10-10 MED ORDER — PANTOPRAZOLE SODIUM 40 MG IV SOLR
40.0000 mg | Freq: Every day | INTRAVENOUS | Status: DC
  Administered 2019-10-10: 40 mg via INTRAVENOUS
  Filled 2019-10-10: qty 40

## 2019-10-10 MED ORDER — LEVOCETIRIZINE DIHYDROCHLORIDE 5 MG PO TABS: 5.0000 mg | ORAL_TABLET | Freq: Every evening | ORAL | Status: DC

## 2019-10-10 MED ORDER — DEXAMETHASONE SODIUM PHOSPHATE 10 MG/ML IJ SOLN
INTRAMUSCULAR | Status: DC | PRN
  Administered 2019-10-10: 10 mg via INTRAVENOUS

## 2019-10-10 MED ORDER — HYDROMORPHONE HCL 1 MG/ML IJ SOLN: 0.2500 mg | INTRAMUSCULAR | Status: DC | PRN

## 2019-10-10 MED ORDER — OXYCODONE HCL 5 MG PO TABS
5.0000 mg | ORAL_TABLET | Freq: Once | ORAL | Status: AC | PRN
  Administered 2019-10-10: 5 mg via ORAL

## 2019-10-10 MED ORDER — OXYCODONE HCL 5 MG/5ML PO SOLN: 5.0000 mg | Freq: Once | ORAL | Status: AC | PRN

## 2019-10-10 MED ORDER — ACETAMINOPHEN 325 MG PO TABS
325.0000 mg | ORAL_TABLET | ORAL | Status: DC | PRN
  Administered 2019-10-10: 650 mg via ORAL

## 2019-10-10 MED ORDER — EPHEDRINE SULFATE 50 MG/ML IJ SOLN
INTRAMUSCULAR | Status: DC | PRN
  Administered 2019-10-10: 10 mg via INTRAVENOUS
  Administered 2019-10-10: 5 mg via INTRAVENOUS

## 2019-10-10 MED ORDER — POLYETHYLENE GLYCOL 3350 17 G PO PACK: 17.0000 g | PACK | Freq: Every day | ORAL | Status: DC | PRN

## 2019-10-10 MED ORDER — PHENYLEPHRINE HCL-NACL 10-0.9 MG/250ML-% IV SOLN
INTRAVENOUS | Status: DC | PRN
  Administered 2019-10-10: 25 ug/min via INTRAVENOUS

## 2019-10-10 MED ORDER — DEXAMETHASONE SODIUM PHOSPHATE 10 MG/ML IJ SOLN
INTRAMUSCULAR | Status: AC
  Filled 2019-10-10: qty 1

## 2019-10-10 MED ORDER — THROMBIN 5000 UNITS EX SOLR
OROMUCOSAL | Status: DC | PRN
  Administered 2019-10-10 (×2): 5 mL

## 2019-10-10 MED ORDER — THROMBIN 5000 UNITS EX SOLR
CUTANEOUS | Status: AC
  Filled 2019-10-10: qty 5000

## 2019-10-10 MED ORDER — VALACYCLOVIR HCL 500 MG PO TABS
1000.0000 mg | ORAL_TABLET | Freq: Every day | ORAL | Status: DC
  Administered 2019-10-11 – 2019-10-15 (×5): 1000 mg via ORAL
  Filled 2019-10-10 (×5): qty 2

## 2019-10-10 MED ORDER — FENTANYL CITRATE (PF) 250 MCG/5ML IJ SOLN
INTRAMUSCULAR | Status: AC
  Filled 2019-10-10: qty 5

## 2019-10-10 MED ORDER — THROMBIN 20000 UNITS EX SOLR
CUTANEOUS | Status: DC | PRN
  Administered 2019-10-10: 20 mL

## 2019-10-10 MED ORDER — LABETALOL HCL 5 MG/ML IV SOLN: 10.0000 mg | INTRAVENOUS | Status: DC | PRN

## 2019-10-10 MED ORDER — HYDROMORPHONE HCL 1 MG/ML IJ SOLN
INTRAMUSCULAR | Status: AC
  Filled 2019-10-10: qty 1

## 2019-10-10 MED ORDER — SENNA 8.6 MG PO TABS
1.0000 | ORAL_TABLET | Freq: Two times a day (BID) | ORAL | Status: DC
  Administered 2019-10-11 – 2019-10-15 (×6): 8.6 mg via ORAL
  Filled 2019-10-10 (×8): qty 1

## 2019-10-10 MED ORDER — CEFAZOLIN SODIUM-DEXTROSE 2-4 GM/100ML-% IV SOLN
2.0000 g | Freq: Three times a day (TID) | INTRAVENOUS | Status: AC
  Administered 2019-10-10 – 2019-10-11 (×2): 2 g via INTRAVENOUS
  Filled 2019-10-10 (×2): qty 100

## 2019-10-10 MED ORDER — ONDANSETRON HCL 4 MG/2ML IJ SOLN
INTRAMUSCULAR | Status: DC | PRN
  Administered 2019-10-10: 4 mg via INTRAVENOUS

## 2019-10-10 MED ORDER — ACETAMINOPHEN 160 MG/5ML PO SOLN: 325.0000 mg | ORAL | Status: DC | PRN

## 2019-10-10 MED ORDER — OXYCODONE HCL 5 MG PO TABS
ORAL_TABLET | ORAL | Status: AC
  Filled 2019-10-10: qty 1

## 2019-10-10 MED ORDER — BACITRACIN ZINC 500 UNIT/GM EX OINT
TOPICAL_OINTMENT | CUTANEOUS | Status: DC | PRN
  Administered 2019-10-10: 1 via TOPICAL

## 2019-10-10 MED ORDER — ONDANSETRON HCL 4 MG PO TABS: 4.0000 mg | ORAL_TABLET | ORAL | Status: DC | PRN

## 2019-10-10 MED ORDER — PHENYLEPHRINE 40 MCG/ML (10ML) SYRINGE FOR IV PUSH (FOR BLOOD PRESSURE SUPPORT)
PREFILLED_SYRINGE | INTRAVENOUS | Status: AC
  Filled 2019-10-10: qty 10

## 2019-10-10 MED ORDER — LIDOCAINE 2% (20 MG/ML) 5 ML SYRINGE
INTRAMUSCULAR | Status: DC | PRN
  Administered 2019-10-10: 40 mg via INTRAVENOUS

## 2019-10-10 MED ORDER — LIDOCAINE 2% (20 MG/ML) 5 ML SYRINGE
INTRAMUSCULAR | Status: AC
  Filled 2019-10-10: qty 5

## 2019-10-10 MED ORDER — MEPERIDINE HCL 25 MG/ML IJ SOLN: 6.2500 mg | INTRAMUSCULAR | Status: DC | PRN

## 2019-10-10 MED ORDER — CHLORHEXIDINE GLUCONATE 0.12 % MT SOLN
15.0000 mL | Freq: Once | OROMUCOSAL | Status: AC
  Administered 2019-10-10: 15 mL via OROMUCOSAL
  Filled 2019-10-10: qty 15

## 2019-10-10 MED ORDER — EPHEDRINE 5 MG/ML INJ
INTRAVENOUS | Status: AC
  Filled 2019-10-10: qty 10

## 2019-10-10 MED ORDER — ESMOLOL HCL 100 MG/10ML IV SOLN
INTRAVENOUS | Status: DC | PRN
  Administered 2019-10-10: 20 mg via INTRAVENOUS
  Administered 2019-10-10: 30 mg via INTRAVENOUS

## 2019-10-10 MED ORDER — INSULIN ASPART 100 UNIT/ML ~~LOC~~ SOLN
0.0000 [IU] | Freq: Three times a day (TID) | SUBCUTANEOUS | Status: DC
  Administered 2019-10-15: 3 [IU] via SUBCUTANEOUS
  Filled 2019-10-10: qty 0.15

## 2019-10-10 MED ORDER — THROMBIN 20000 UNITS EX SOLR
CUTANEOUS | Status: AC
  Filled 2019-10-10: qty 20000

## 2019-10-10 MED ORDER — LIDOCAINE-EPINEPHRINE 1 %-1:100000 IJ SOLN
INTRAMUSCULAR | Status: DC | PRN
  Administered 2019-10-10: 8 mL

## 2019-10-10 MED ORDER — SUGAMMADEX SODIUM 200 MG/2ML IV SOLN
INTRAVENOUS | Status: DC | PRN
  Administered 2019-10-10: 200 mg via INTRAVENOUS

## 2019-10-10 MED ORDER — FLEET ENEMA 7-19 GM/118ML RE ENEM: 1.0000 | ENEMA | Freq: Once | RECTAL | Status: DC | PRN

## 2019-10-10 MED ORDER — ONDANSETRON HCL 4 MG/2ML IJ SOLN
INTRAMUSCULAR | Status: AC
  Filled 2019-10-10: qty 2

## 2019-10-10 MED ORDER — BACITRACIN ZINC 500 UNIT/GM EX OINT
TOPICAL_OINTMENT | CUTANEOUS | Status: AC
  Filled 2019-10-10: qty 28.35

## 2019-10-10 MED ORDER — BISACODYL 5 MG PO TBEC: 5.0000 mg | DELAYED_RELEASE_TABLET | Freq: Every day | ORAL | Status: DC | PRN

## 2019-10-10 MED ORDER — HYDROMORPHONE HCL 1 MG/ML IJ SOLN
0.5000 mg | INTRAMUSCULAR | Status: DC | PRN
  Administered 2019-10-11 – 2019-10-12 (×5): 1 mg via INTRAVENOUS
  Administered 2019-10-12: 0.5 mg via INTRAVENOUS
  Administered 2019-10-13 – 2019-10-14 (×3): 1 mg via INTRAVENOUS
  Administered 2019-10-14: 0.5 mg via INTRAVENOUS
  Filled 2019-10-10 (×10): qty 1

## 2019-10-10 MED ORDER — SODIUM CHLORIDE 0.9 % IV SOLN
INTRAVENOUS | Status: DC | PRN
  Administered 2019-10-10: 1000 mg via INTRAVENOUS

## 2019-10-10 MED ORDER — IOHEXOL 300 MG/ML  SOLN
75.0000 mL | Freq: Once | INTRAMUSCULAR | Status: AC | PRN
  Administered 2019-10-10: 75 mL via INTRAVENOUS

## 2019-10-10 MED ORDER — ONDANSETRON HCL 4 MG/2ML IJ SOLN
4.0000 mg | INTRAMUSCULAR | Status: DC | PRN
  Administered 2019-10-14: 4 mg via INTRAVENOUS

## 2019-10-10 MED ORDER — 0.9 % SODIUM CHLORIDE (POUR BTL) OPTIME
TOPICAL | Status: DC | PRN
  Administered 2019-10-10: 3000 mL

## 2019-10-10 MED ORDER — ACETAMINOPHEN 325 MG PO TABS: 325.0000 mg | ORAL_TABLET | ORAL | Status: DC | PRN

## 2019-10-10 MED ORDER — PROPOFOL 10 MG/ML IV BOLUS
INTRAVENOUS | Status: DC | PRN
  Administered 2019-10-10: 60 mg via INTRAVENOUS
  Administered 2019-10-10: 30 mg via INTRAVENOUS
  Administered 2019-10-10: 200 mg via INTRAVENOUS

## 2019-10-10 MED ORDER — ACETAMINOPHEN 650 MG RE SUPP: 650.0000 mg | RECTAL | Status: DC | PRN

## 2019-10-10 MED ORDER — LIDOCAINE-EPINEPHRINE 1 %-1:100000 IJ SOLN
INTRAMUSCULAR | Status: AC
  Filled 2019-10-10: qty 1

## 2019-10-10 MED ORDER — CEFAZOLIN SODIUM-DEXTROSE 2-3 GM-%(50ML) IV SOLR
INTRAVENOUS | Status: DC | PRN
  Administered 2019-10-10: 2 g via INTRAVENOUS

## 2019-10-10 MED ORDER — ACETAMINOPHEN 325 MG PO TABS
650.0000 mg | ORAL_TABLET | ORAL | Status: DC | PRN
  Administered 2019-10-12 – 2019-10-13 (×2): 650 mg via ORAL
  Filled 2019-10-10 (×2): qty 2

## 2019-10-10 SURGICAL SUPPLY — 105 items
APL SKNCLS STERI-STRIP NONHPOA (GAUZE/BANDAGES/DRESSINGS)
BAND INSRT 18 STRL LF DISP RB (MISCELLANEOUS)
BAND RUBBER #18 3X1/16 STRL (MISCELLANEOUS) IMPLANT
BENZOIN TINCTURE PRP APPL 2/3 (GAUZE/BANDAGES/DRESSINGS) IMPLANT
BLADE CLIPPER SURG (BLADE) ×3 IMPLANT
BLADE SAW GIGLI 16 STRL (MISCELLANEOUS) IMPLANT
BLADE SURG 15 STRL LF DISP TIS (BLADE) IMPLANT
BLADE SURG 15 STRL SS (BLADE)
BLADE ULTRA TIP 2M (BLADE) ×3 IMPLANT
BNDG CMPR 75X41 PLY ABS (GAUZE/BANDAGES/DRESSINGS) ×2
BNDG CMPR 75X41 PLY HI ABS (GAUZE/BANDAGES/DRESSINGS) ×2
BNDG GAUZE ELAST 4 BULKY (GAUZE/BANDAGES/DRESSINGS) ×2 IMPLANT
BNDG STRETCH 4X75 NS LF (GAUZE/BANDAGES/DRESSINGS) ×1 IMPLANT
BNDG STRETCH 4X75 STRL LF (GAUZE/BANDAGES/DRESSINGS) ×1 IMPLANT
BUR ACORN 6.0 PRECISION (BURR) ×3 IMPLANT
BUR PRECISION FLUTE 5.0 (BURR) ×1 IMPLANT
BUR ROUND FLUTED 4 SOFT TCH (BURR) IMPLANT
BUR SPIRAL ROUTER 2.3 (BUR) ×3 IMPLANT
CANISTER SUCT 3000ML PPV (MISCELLANEOUS) ×6 IMPLANT
CARTRIDGE OIL MAESTRO DRILL (MISCELLANEOUS) ×2 IMPLANT
CATH VENTRIC 35X38 W/TROCAR LG (CATHETERS) IMPLANT
CLIP VESOCCLUDE MED 6/CT (CLIP) IMPLANT
CNTNR URN SCR LID CUP LEK RST (MISCELLANEOUS) ×2 IMPLANT
CONT SPEC 4OZ STRL OR WHT (MISCELLANEOUS) ×3
COVER MAYO STAND STRL (DRAPES) IMPLANT
COVER WAND RF STERILE (DRAPES) ×3 IMPLANT
DECANTER SPIKE VIAL GLASS SM (MISCELLANEOUS) ×3 IMPLANT
DIFFUSER DRILL AIR PNEUMATIC (MISCELLANEOUS) ×3 IMPLANT
DRAIN SUBARACHNOID (WOUND CARE) IMPLANT
DRAPE HALF SHEET 40X57 (DRAPES) ×3 IMPLANT
DRAPE MICROSCOPE LEICA (MISCELLANEOUS) IMPLANT
DRAPE NEUROLOGICAL W/INCISE (DRAPES) ×3 IMPLANT
DRAPE STERI IOBAN 125X83 (DRAPES) IMPLANT
DRAPE SURG 17X23 STRL (DRAPES) IMPLANT
DRAPE WARM FLUID 44X44 (DRAPES) ×3 IMPLANT
DRSG ADAPTIC 3X8 NADH LF (GAUZE/BANDAGES/DRESSINGS) IMPLANT
DRSG TELFA 3X8 NADH (GAUZE/BANDAGES/DRESSINGS) ×3 IMPLANT
DURAGUARD 06CMX08CM ×2 IMPLANT
DURAPREP 6ML APPLICATOR 50/CS (WOUND CARE) ×3 IMPLANT
ELECT REM PT RETURN 9FT ADLT (ELECTROSURGICAL) ×3
ELECTRODE REM PT RTRN 9FT ADLT (ELECTROSURGICAL) ×2 IMPLANT
EVACUATOR 1/8 PVC DRAIN (DRAIN) IMPLANT
EVACUATOR SILICONE 100CC (DRAIN) IMPLANT
FORCEPS BIPOLAR SPETZLER 8 1.0 (NEUROSURGERY SUPPLIES) ×3 IMPLANT
GAUZE 4X4 16PLY RFD (DISPOSABLE) ×2 IMPLANT
GAUZE SPONGE 4X4 12PLY STRL (GAUZE/BANDAGES/DRESSINGS) ×3 IMPLANT
GLOVE BIO SURGEON STRL SZ7.5 (GLOVE) IMPLANT
GLOVE BIOGEL PI IND STRL 7.0 (GLOVE) IMPLANT
GLOVE BIOGEL PI IND STRL 7.5 (GLOVE) ×4 IMPLANT
GLOVE BIOGEL PI INDICATOR 7.0 (GLOVE)
GLOVE BIOGEL PI INDICATOR 7.5 (GLOVE) ×2
GLOVE ECLIPSE 7.0 STRL STRAW (GLOVE) ×6 IMPLANT
GLOVE EXAM NITRILE XL STR (GLOVE) IMPLANT
GOWN STRL REUS W/ TWL LRG LVL3 (GOWN DISPOSABLE) ×4 IMPLANT
GOWN STRL REUS W/ TWL XL LVL3 (GOWN DISPOSABLE) IMPLANT
GOWN STRL REUS W/TWL 2XL LVL3 (GOWN DISPOSABLE) IMPLANT
GOWN STRL REUS W/TWL LRG LVL3 (GOWN DISPOSABLE) ×6
GOWN STRL REUS W/TWL XL LVL3 (GOWN DISPOSABLE)
HEMOSTAT POWDER KIT SURGIFOAM (HEMOSTASIS) ×4 IMPLANT
HEMOSTAT SURGICEL 2X14 (HEMOSTASIS) ×3 IMPLANT
HOOK DURA 1/2IN (MISCELLANEOUS) ×3 IMPLANT
IV NS 1000ML (IV SOLUTION) ×3
IV NS 1000ML BAXH (IV SOLUTION) ×2 IMPLANT
KIT BASIN OR (CUSTOM PROCEDURE TRAY) ×3 IMPLANT
KIT DRAIN CSF ACCUDRAIN (MISCELLANEOUS) IMPLANT
KIT TURNOVER KIT B (KITS) ×3 IMPLANT
KNIFE ARACHNOID DISP AM-24-S (MISCELLANEOUS) ×3 IMPLANT
MARKER SPHERE PSV REFLC 13MM (MARKER) ×6 IMPLANT
NDL SPNL 18GX3.5 QUINCKE PK (NEEDLE) IMPLANT
NEEDLE HYPO 22GX1.5 SAFETY (NEEDLE) ×3 IMPLANT
NEEDLE SPNL 18GX3.5 QUINCKE PK (NEEDLE) IMPLANT
NS IRRIG 1000ML POUR BTL (IV SOLUTION) ×9 IMPLANT
OIL CARTRIDGE MAESTRO DRILL (MISCELLANEOUS) ×3
PACK CRANIOTOMY CUSTOM (CUSTOM PROCEDURE TRAY) ×3 IMPLANT
PAD DRESSING TELFA 3X8 NADH (GAUZE/BANDAGES/DRESSINGS) IMPLANT
PATTIES SURGICAL .25X.25 (GAUZE/BANDAGES/DRESSINGS) IMPLANT
PATTIES SURGICAL .5 X.5 (GAUZE/BANDAGES/DRESSINGS) ×1 IMPLANT
PATTIES SURGICAL .5 X3 (DISPOSABLE) ×1 IMPLANT
PATTIES SURGICAL 1/4 X 3 (GAUZE/BANDAGES/DRESSINGS) IMPLANT
PATTIES SURGICAL 1X1 (DISPOSABLE) ×1 IMPLANT
PIN MAYFIELD SKULL DISP (PIN) ×3 IMPLANT
SET CARTRIDGE AND TUBING (SET/KITS/TRAYS/PACK) ×1 IMPLANT
SPECIMEN JAR SMALL (MISCELLANEOUS) IMPLANT
SPONGE NEURO XRAY DETECT 1X3 (DISPOSABLE) ×1 IMPLANT
SPONGE SURGIFOAM ABS GEL 100 (HEMOSTASIS) ×3 IMPLANT
STAPLER VISISTAT 35W (STAPLE) ×3 IMPLANT
STOCKINETTE 6  STRL (DRAPES)
STOCKINETTE 6 STRL (DRAPES) IMPLANT
SUT ETHILON 3 0 FSL (SUTURE) IMPLANT
SUT ETHILON 3 0 PS 1 (SUTURE) IMPLANT
SUT NURALON 4 0 TR CR/8 (SUTURE) ×9 IMPLANT
SUT SILK 0 TIES 10X30 (SUTURE) IMPLANT
SUT VIC AB 0 CT1 18XCR BRD8 (SUTURE) ×4 IMPLANT
SUT VIC AB 0 CT1 8-18 (SUTURE) ×9
SUT VIC AB 3-0 SH 8-18 (SUTURE) ×6 IMPLANT
TAPE CLOTH 1X10 TAN NS (GAUZE/BANDAGES/DRESSINGS) ×3 IMPLANT
TIP STANDARD 36KHZ (INSTRUMENTS) ×3
TIP STD 36KHZ (INSTRUMENTS) IMPLANT
TOWEL GREEN STERILE (TOWEL DISPOSABLE) ×3 IMPLANT
TOWEL GREEN STERILE FF (TOWEL DISPOSABLE) ×3 IMPLANT
TRAY FOLEY MTR SLVR 16FR STAT (SET/KITS/TRAYS/PACK) ×3 IMPLANT
TUBE CONNECTING 12X1/4 (SUCTIONS) ×3 IMPLANT
UNDERPAD 30X36 HEAVY ABSORB (UNDERPADS AND DIAPERS) ×3 IMPLANT
WATER STERILE IRR 1000ML POUR (IV SOLUTION) ×3 IMPLANT
WRENCH TORQUE 36KHZ (INSTRUMENTS) ×1 IMPLANT

## 2019-10-10 NOTE — Anesthesia Procedure Notes (Signed)
Arterial Line Insertion Start/End9/20/2021 7:05 AM, 10/10/2019 7:10 AM Performed by: Lavell Luster, CRNA, CRNA  Preanesthetic checklist: patient identified, IV checked, site marked, risks and benefits discussed, surgical consent, monitors and equipment checked, pre-op evaluation, timeout performed and anesthesia consent Right, radial was placed Catheter size: 20 G Hand hygiene performed  and maximum sterile barriers used  Allen's test indicative of satisfactory collateral circulation Attempts: 1 Procedure performed without using ultrasound guided technique. Following insertion, dressing applied and Biopatch. Post procedure assessment: normal  Patient tolerated the procedure well with no immediate complications.

## 2019-10-10 NOTE — Anesthesia Procedure Notes (Signed)
Procedure Name: Intubation Date/Time: 10/10/2019 8:09 AM Performed by: Glynda Jaeger, CRNA Pre-anesthesia Checklist: Patient identified, Patient being monitored, Timeout performed, Emergency Drugs available and Suction available Patient Re-evaluated:Patient Re-evaluated prior to induction Oxygen Delivery Method: Circle System Utilized Preoxygenation: Pre-oxygenation with 100% oxygen Induction Type: IV induction Ventilation: Mask ventilation without difficulty Laryngoscope Size: Mac and 4 Grade View: Grade II Tube type: Oral Tube size: 7.5 mm Number of attempts: 1 Airway Equipment and Method: Stylet Placement Confirmation: ETT inserted through vocal cords under direct vision,  positive ETCO2 and breath sounds checked- equal and bilateral Secured at: 23 cm Tube secured with: Tape Dental Injury: Teeth and Oropharynx as per pre-operative assessment

## 2019-10-10 NOTE — Anesthesia Preprocedure Evaluation (Addendum)
Anesthesia Evaluation  Patient identified by MRN, date of birth, ID band Patient awake    Reviewed: Patient's Chart, lab work & pertinent test results  Airway Mallampati: I  TM Distance: >3 FB Neck ROM: Full    Dental  (+) Teeth Intact   Pulmonary neg pulmonary ROS,    Pulmonary exam normal        Cardiovascular hypertension, Pt. 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  Peds negative pediatric ROS (+)  Hematology negative hematology ROS (+)   Anesthesia Other Findings   Reproductive/Obstetrics negative OB ROS                            Anesthesia Physical Anesthesia Plan  ASA: III  Anesthesia Plan: General   Post-op Pain Management:    Induction: Intravenous  PONV Risk Score and Plan: 3 and Ondansetron, Dexamethasone and Propofol infusion  Airway Management Planned: Oral ETT and Mask  Additional Equipment: Arterial line  Intra-op Plan:   Post-operative Plan: Extubation in OR  Informed Consent: I have reviewed the patients History and Physical, chart, labs and discussed the procedure including the risks, benefits and alternatives for the proposed anesthesia with the patient or authorized representative who has indicated his/her understanding and acceptance.       Plan Discussed with: Anesthesiologist and Surgeon  Anesthesia Plan Comments: (Lab Results      Component                Value               Date                      WBC                      7.1                  10/04/2019                HGB                      12.0                10/04/2019                HCT                      36.1                10/04/2019                MCV                      86                  10/04/2019                PLT  273                 10/04/2019           Covid-19 Nucleic Acid Test Results Lab Results      Component                Value               Date                      [id number]              NEGATIVE            10/08/2019          )       Anesthesia Quick Evaluation

## 2019-10-10 NOTE — Transfer of Care (Signed)
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) APPLICATION OF CRANIAL NAVIGATION (N/A Head)  Patient Location: PACU  Anesthesia Type:General  Level of Consciousness: awake, alert , oriented, patient cooperative and responds to stimulation  Airway & Oxygen Therapy: Patient Spontanous Breathing and Patient connected to face mask oxygen  Post-op Assessment: Report given to RN, Post -op Vital signs reviewed and stable and Patient moving all extremities X 4  Post vital signs: Reviewed and stable  Last Vitals:  Vitals Value Taken Time  BP 119/83 10/10/19 1151  Temp    Pulse 73 10/10/19 1152  Resp 16 10/10/19 1153  SpO2 100 % 10/10/19 1152  Vitals shown include unvalidated device data.  Last Pain:  Vitals:   10/10/19 0630  TempSrc: Temporal         Complications: No complications documented.

## 2019-10-10 NOTE — H&P (Signed)
Chief Complaint   Brain tumor  HPI   HPI: Alexandra [name] is a 54 y.o. female with progressively enlarging left forehead mass over the past three months. Briefly, patient was seen by her primary care physician for this mass and it was treated as an insect bite with antibiotics, with no improvement. 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 About 1 week ago she went to the outpatient Surgical Center for resection of the lipoma.  Intraoperatively, it appeared that the lesion was clearly not a lipoma and likely extended through the frontal bone into the intracranial vault and the surgery was therefore aborted and a maxillofacial head CT was ordered revealing a  mass with epicenter apparently in the left frontal bone with both intracranial and extracranial extension. Given size and rapid growth over last 3 months, it was recommended she undergo surgical resection. She presents today for surgery. Her case was also discussed at the multidisciplinary neuro-oncology conference where the consensus recommendation was to proceed with resection and cranial reconstruction.  Patient Active Problem List   Diagnosis Date Noted  . Mixed hyperlipidemia 03/23/2018  . Groin pain, right 02/10/2018  . Elevated cholesterol 02/10/2018  . Right lower quadrant abdominal pain 02/10/2018  . Essential hypertension 10/23/2017  . Prediabetes 10/23/2017  . Insomnia 10/23/2017    PMH: Past Medical History:  Diagnosis Date  . Anxiety   . Hypertension   . Pre-diabetes     PSH: Past Surgical History:  Procedure Laterality Date  . ABLATION    . HAMMER TOE SURGERY Left   . TUBAL LIGATION      Medications Prior to Admission  Medication Sig Dispense Refill Last Dose  . atorvastatin (LIPITOR) 10 MG tablet TAKE 1 TABLET BY MOUTH EVERY DAY (Patient taking differently: Take 10 mg by mouth daily. ) 90 tablet 1 10/10/2019 at 0430  . benazepril-hydrochlorthiazide  (LOTENSIN HCT) 20-12.5 MG tablet TAKE 1 TABLET BY MOUTH EVERY DAY (Patient taking differently: Take 0.5 tablets by mouth daily. ) 90 tablet 1 10/09/2019 at Unknown time  . Eszopiclone 3 MG TABS Take 1 tablet (3 mg total) by mouth at bedtime. Take immediately before bedtime 30 tablet 5 10/09/2019 at Unknown time  . levocetirizine (XYZAL) 5 MG tablet TAKE 1 TABLET BY MOUTH EVERY DAY IN THE EVENING (Patient taking differently: Take 5 mg by mouth every evening. ) 30 tablet 2 10/09/2019 at Unknown time  . OZEMPIC, 0.25 OR 0.5 MG/DOSE, 2 MG/1.5ML SOPN INJECT 0.5 MG TOTAL INTO THE SKIN ONCE A WEEK. 4.5 mL 1 Past Week at Unknown time  . valACYclovir (VALTREX) 1000 MG tablet Take 1 tablet (1,000 mg total) by mouth daily. Patient taking it as needed 90 tablet 1 10/10/2019 at 0430  . Insulin Pen Needle (NOVOFINE PLUS) 32G X 4 MM MISC Inject 1 pen into the skin daily. Inject 1 pen with Victoza SQ 100 each 3     SH: Social History   Tobacco Use  . Smoking status: Never Smoker  . Smokeless tobacco: Never Used  Vaping Use  . Vaping Use: Never used  Substance Use Topics  . Alcohol use: Yes    Comment: occassionally  . Drug use: Not Currently    MEDS: Prior to Admission medications   Medication Sig Start Date End Date Taking? Authorizing Provider  atorvastatin (LIPITOR) 10 MG tablet TAKE 1 TABLET BY MOUTH EVERY DAY Patient taking differently: Take 10 mg by mouth daily.  06/14/19  Yes [name], FNP  benazepril-hydrochlorthiazide (LOTENSIN HCT) 20-12.5 MG tablet TAKE 1 TABLET BY MOUTH EVERY DAY Patient taking differently: Take 0.5 tablets by mouth daily.  01/24/19  Yes [name], FNP  Eszopiclone 3 MG TABS Take 1 tablet (3 mg total) by mouth at bedtime. Take immediately before bedtime 10/04/19 10/03/20 Yes [name], FNP  levocetirizine (XYZAL) 5 MG tablet TAKE 1 TABLET BY MOUTH EVERY DAY IN THE EVENING Patient taking differently: Take 5 mg by mouth every evening.  07/20/19  Yes [name], FNP    OZEMPIC, 0.25 OR 0.5 MG/DOSE, 2 MG/1.5ML SOPN INJECT 0.5 MG TOTAL INTO THE SKIN ONCE A WEEK. 10/04/19  Yes [name], FNP  valACYclovir (VALTREX) 1000 MG tablet Take 1 tablet (1,000 mg total) by mouth daily. Patient taking it as needed 10/04/19  Yes [name], FNP  Insulin Pen Needle (NOVOFINE PLUS) 32G X 4 MM MISC Inject 1 pen into the skin daily. Inject 1 pen with Victoza SQ 10/23/17   [name], FNP    ALLERGY: No Known Allergies  Social History   Tobacco Use  . Smoking status: Never Smoker  . Smokeless tobacco: Never Used  Substance Use Topics  . Alcohol use: Yes    Comment: occassionally     Family History  Problem Relation Age of Onset  . Breast cancer Maternal Grandmother   . Heart disease Mother   . Hypertension Mother   . Sarcoidosis Father   . Diabetes Maternal Grandfather      ROS   ROS  Exam   Vitals:   10/10/19 0630  BP: 111/70  Pulse: 66  Resp: 18  Temp: 98.7 F (37.1 C)  SpO2: 97%   General appearance: WDWN, NAD Eyes: No scleral injection Cardiovascular: Regular rate and rhythm without murmurs, rubs, gallops. No edema or variciosities. Distal pulses normal. Pulmonary: Effort normal, non-labored breathing Musculoskeletal:     Muscle tone upper extremities: Normal    Muscle tone lower extremities: Normal    Motor exam: Upper Extremities Deltoid Bicep Tricep Grip  Right 5/5 5/5 5/5 5/5  Left 5/5 5/5 5/5 5/5   Lower Extremity IP Quad PF DF EHL  Right 5/5 5/5 5/5 5/5 5/5  Left 5/5 5/5 5/5 5/5 5/5   Neurological Mental Status:    - Patient is awake, alert, oriented to person, place, month, year, and situation    - Patient is able to give a clear and coherent history.    - No signs of aphasia or neglect Cranial Nerves    - II: Visual Fields are full. PERRL    - III/IV/VI: EOMI without ptosis or diploplia.     - V: Facial sensation is grossly normal    - VII: Facial movement is symmetric.     - VIII: hearing is intact to voice     - X: Uvula elevates symmetrically    - XI: Shoulder shrug is symmetric.    - XII: tongue is midline without atrophy or fasciculations.  Sensory: Sensation grossly intact to LT   Results - Imaging/Labs   Results for orders placed or performed during the hospital encounter of 10/10/19 (from the past 48 hour(s))  Prepare RBC (crossmatch)     Status: None   Collection Time: 10/10/19  6:25 AM  Result Value Ref Range   Order Confirmation      ORDER PROCESSED BY BLOOD BANK Performed at [facility], [address], [place], [place] [address]   Glucose, capillary  Status: None   Collection Time: 10/10/19  6:33 AM  Result Value Ref Range   Glucose-Capillary 76 70 - 99 mg/dL    Comment: Glucose reference range applies only to samples taken after fasting for at least 8 hours.  ABO/Rh     Status: None   Collection Time: 10/10/19  6:46 AM  Result Value Ref Range   ABO/RH(D)      O POS Performed at [address], [place], [place] [address]     No results found.  IMAGING: Maxillofacial CT with and without contrast was personally reviewed.  This demonstrates a mass with epicenter apparently in the left frontal bone.  There is an extracranial component as well as a component which extends intracranially with mass effect upon the left frontal pole.  The mass does also appear to extend into the left frontal sinus.  While the mass does reach the midline, unclear whether it involves the anterior portion of superior sagittal sinus.   Impression/Plan   54 y.o. female with left frontal mass with both intracranial and extracranial component.  This appears to demonstrate relatively rapid growth over the last 3 months.  Given the size of the lesion currently as well as its relatively rapid growth over the last few months I think surgical resection for diagnosis and relief of mass effect is indicated.  We will proceed with 2 stage resection and cranioplasty including  initially stereotactic left frontal craniectomy for resection of tumor followed in several days by cranioplasty.  We have reviewed the indications for surgery, the associated risks, benefits and alternatives at length in the office.  All questions today were answered and consent was obtained.  Consuella Lose, MD [facility]

## 2019-10-10 NOTE — Progress Notes (Signed)
Pt. Needs CT scan. Began calling about 1500 to let CT know we needed to come, Dr. Kathyrn Sheriff also called to let them know. CT said they would call when they were ready. I recalled about 1600 to check on the status they said they needed about 15 min and they would call me again. I didn't receive a call so I recalled about 45 min later. They said there was still a procedure and 2 stat cases ahead of [place]. I called Dr. Kathyrn Sheriff to let him know and see if we could go ahead and take patient to her room so family could come and visit but he didn't answer. I called [facility] to make sure it was ok for [place] to come up and they said yes. Patient now in room. CT still needs to be done.

## 2019-10-10 NOTE — Progress Notes (Signed)
Pt's belongings at the bedside include: purse, wallet (pt stated $ was "hidden"), Bible, cell phone, charger, clothes, shoes.

## 2019-10-10 NOTE — Anesthesia Postprocedure Evaluation (Signed)
Anesthesia Post Note  Patient: Alexandra [name]  Procedure(s) Performed: Left frontal Stereotactic craniectomy for resection of tumor (Left Head) APPLICATION OF CRANIAL NAVIGATION (N/A Head)     Patient location during evaluation: PACU Anesthesia Type: General Level of consciousness: awake and alert Pain management: pain level controlled Vital Signs Assessment: post-procedure vital signs reviewed and stable Respiratory status: spontaneous breathing, nonlabored ventilation, respiratory function stable and patient connected to nasal cannula oxygen Cardiovascular status: blood pressure returned to baseline and stable Postop Assessment: no apparent nausea or vomiting Anesthetic complications: no   No complications documented.  Last Vitals:  Vitals:   10/10/19 1700 10/10/19 1755  BP: 103/77 97/75  Pulse: 81 79  Resp: 13 19  Temp: 36.4 C   SpO2: 100% 100%    Last Pain:  Vitals:   10/10/19 1755  TempSrc:   PainSc: 6                  Kista Robb P [place]

## 2019-10-10 NOTE — Op Note (Signed)
NEUROSURGERY OPERATIVE NOTE   PREOP DIAGNOSIS:  Left frontal tumor   POSTOP DIAGNOSIS: Same  PROCEDURE: Stereotactic bifrontal craniectomy for resection of tumor Use of intraoperative microscope for microdissection  SURGEON: Dr. Lisbeth Renshaw, MD  ASSISTANT: Cindra Presume, PA-C  ANESTHESIA: General Endotracheal  EBL: 1700cc  TRANSFUSIONS: 2U PRBC  SPECIMENS:  1. Left frontal tumor for frozen and permanent section 2. Left frontal bone  DRAINS: None  COMPLICATIONS: None immediate  CONDITION: Hemodynamically stable to PACU  HISTORY: Alexandra [name] is a 54 y.o. female initially presenting to the outpatient neurosurgery clinic with a left frontal subcutaneous mass lesion.  She initially presented to her primary doctor and was subsequently referred to dermatology.  With a presumptive diagnosis of lipoma she was taken to the operating room at the outpatient surgical center by plastic surgery for removal of the lipoma however intraoperatively found to have a deeper lesion likely extending through the frontal bone.  The procedure was therefore aborted, and CT scan was completed which confirmed an erosive left frontal lesion extending into the intracranial cavity.  She was therefore referred to me for further evaluation and management.  With the exception of the appearance of the lesion, the patient did not have any neurologic symptoms.  Given the rapidity of its growth and the imaging findings, surgical resection for relief of mass-effect and definitive diagnosis was recommended.  Her case was in fact discussed at the multidisciplinary neuro-oncology conference where the consensus opinion was to proceed with resection.  The risks, benefits, and alternatives to surgery as well as the expected postoperative course was reviewed in detail with the patient.  After all her questions were answered, informed consent was obtained and witnessed.  PROCEDURE IN DETAIL: The patient was  brought to the operating room. After induction of general anesthesia, the patient was positioned on the operative table in the Mayfield head holder in the supine position. All pressure points were meticulously padded.  The preoperative stereotactic MRI scan was then coregistered with surface markers until a satisfactory accuracy was achieved.  I then used the stereotactic system to plan out a three-quarter bicoronal incision which would allow access to the entire tumor as well as the superior sagittal sinus.  Skin incision was then marked out and prepped and draped in the usual sterile fashion.  After timeout was conducted, the incision was infiltrated with local anesthetic with epinephrine.  Incision was then made sharply and carried down through the subcutaneous tissue and the galea.  Using sharp dissection, the loose areolar tissue was then dissected away from the periosteum.  The temporalis muscle was incised and taken with the skin flap.  The tumor did appear to preserve the periosteal layer with the exception of the region directly underlying the site of aborted previous surgery.  Once a single piece flap was elevated it was retracted with fishhooks.  Care was taken to prevent undue pressure on the orbit.  At this point the stereotactic system was again used to plan out a bifrontal craniotomy allowing access to the entire tumor and a small surrounding margin.  Bur hole was then created in the keyhole, superior temporal line, and the high-speed drill was used to drill across the midline overlying the superior sagittal sinus.  The bur holes were then connected with the craniotome, utilizing the left frontal bone defect as part of the craniectomy.  Bovie was then used to truncate the subcutaneous portion of the tumor.  This was sent for frozen pathology.  This allowed  removal of the craniectomy flap.  The bone was also sent for permanent pathology.  At this point, the normal dura surrounding the tumor was  identified.  The tumor was somewhat purple in color, relatively soft, and highly vascular.  It became clear that the tumor appeared to be completely extradural, with a plane easily created between the tumor and the extracranial surface of the dura.  Dura was then incised over the left frontal lobe, and there was in fact no intracranial component of the tumor.  Dural incision was carried toward the midline.  Second dural incision over the right frontal lobe was then created and carried towards the midline.  This then allowed ligation of the superior sagittal sinus at the anterior skull base with bipolar electrocautery.  Posteriorly, a hemostat was placed across the superior sagittal sinus.  The sinus was then ligated and the intracranial portion of the tumor was removed en bloc using Metzenbaum scissors to cut the falx.  Having remove the entire intracranial portion, the wound was irrigated with normal saline.  There was no active bleeding on the brain surface.  Attention was then turned to the small portion of tumor invading the anterior skull base.  Using the ultrasonic aspirator, this portion of tumor was removed from the bone surface.  The surface of the anterior skull base was noted to be somewhat eroded and highly irregular from the tumor.  Small portion of tumor was noted to be extending into the left frontal sinus which was removed.  The dural edges were then coagulated with the bipolar electrocautery.  No further tumor was identified either intracranially or extracranially.  At this point the mucosa in the left frontal sinus was removed, and the edges coagulated with monopolar electrocautery.  The sinus was then packed with Gelfoam and covered with bone wax.  Wound was again irrigated with normal saline irrigation.  Hemostasis was secured with bipolar electrocautery and morselized Gelfoam and thrombin.  A layer of Dura-Guard was used as a dural substitute onlay graft.  The temporalis muscle was then  reapproximated with interrupted 0 Vicryl stitches.  The galea was reapproximated with interrupted 0 Vicryl stitches and the skin was closed with staples.  Bacitracin ointment and sterile dressing was applied after the Mayfield was removed.  At the end of the case all sponge, needle, instrument, and cottonoid counts were correct.  The patient was then transferred to the stretcher, extubated, and taken to the postanesthesia care unit in stable hemodynamic condition.

## 2019-10-11 ENCOUNTER — Inpatient Hospital Stay (HOSPITAL_COMMUNITY): Payer: BC Managed Care – PPO

## 2019-10-11 ENCOUNTER — Encounter (HOSPITAL_COMMUNITY): Payer: Self-pay | Admitting: Neurosurgery

## 2019-10-11 LAB — CBC
HCT: 26.5 % — ABNORMAL LOW (ref 36.0–46.0)
Hemoglobin: 9.1 g/dL — ABNORMAL LOW (ref 12.0–15.0)
MCH: 30.4 pg (ref 26.0–34.0)
MCHC: 34.3 g/dL (ref 30.0–36.0)
MCV: 88.6 fL (ref 80.0–100.0)
Platelets: 155 10*3/uL (ref 150–400)
RBC: 2.99 MIL/uL — ABNORMAL LOW (ref 3.87–5.11)
RDW: 14.6 % (ref 11.5–15.5)
WBC: 16.6 10*3/uL — ABNORMAL HIGH (ref 4.0–10.5)
nRBC: 0 % (ref 0.0–0.2)

## 2019-10-11 LAB — BASIC METABOLIC PANEL
Anion gap: 9 (ref 5–15)
BUN: 10 mg/dL (ref 6–20)
CO2: 23 mmol/L (ref 22–32)
Calcium: 8.4 mg/dL — ABNORMAL LOW (ref 8.9–10.3)
Chloride: 109 mmol/L (ref 98–111)
Creatinine, Ser: 0.82 mg/dL (ref 0.44–1.00)
GFR calc Af Amer: >60 mL/min (ref >60)
GFR calc non Af Amer: >60 mL/min (ref >60)
Glucose, Bld: 104 mg/dL — ABNORMAL HIGH (ref 70–99)
Potassium: 4.2 mmol/L (ref 3.5–5.1)
Sodium: 141 mmol/L (ref 135–145)

## 2019-10-11 LAB — GLUCOSE, CAPILLARY
Glucose-Capillary: 98 mg/dL (ref 70–99)
Glucose-Capillary: 112 mg/dL — ABNORMAL HIGH (ref 70–99)
Glucose-Capillary: 102 mg/dL — ABNORMAL HIGH (ref 70–99)
Glucose-Capillary: 97 mg/dL (ref 70–99)

## 2019-10-11 MED ORDER — PANTOPRAZOLE SODIUM 40 MG PO TBEC
40.0000 mg | DELAYED_RELEASE_TABLET | Freq: Every day | ORAL | Status: DC
  Administered 2019-10-11 – 2019-10-14 (×4): 40 mg via ORAL
  Filled 2019-10-11 (×4): qty 1

## 2019-10-11 MED ORDER — WHITE PETROLATUM EX OINT
TOPICAL_OINTMENT | CUTANEOUS | Status: AC
  Filled 2019-10-11: qty 28.35

## 2019-10-11 MED ORDER — LEVETIRACETAM 500 MG PO TABS
500.0000 mg | ORAL_TABLET | Freq: Two times a day (BID) | ORAL | Status: DC
  Administered 2019-10-11 – 2019-10-15 (×9): 500 mg via ORAL
  Filled 2019-10-11 (×9): qty 1

## 2019-10-11 MED ORDER — GADOBUTROL 1 MMOL/ML IV SOLN
7.5000 mL | Freq: Once | INTRAVENOUS | Status: AC | PRN
  Administered 2019-10-11: 7.5 mL via INTRAVENOUS

## 2019-10-11 NOTE — Evaluation (Addendum)
Occupational Therapy Evaluation Patient Details Name: Alexandra [name] Basic MRN: [id number] DOB: [birth year] Today's Date: 10/11/2019    History of Present Illness Pt is 54yo female wtih progressively enlarging L frontal bone mass with both intracranial and extracranial extension who underwent a craniotomy for tumor resection and the plan for reconstruction later in the week. PMH: HTN, anxiety   Clinical Impression   This 54 y/o female presents with the above. PTA pt living alone and performing ADL, iADL and mobility tasks independently. Today pt performing ADL and room/hallway level mobility without AD grossly at [facility] assist level. Pt with mild sway with further distance mobility but no overt LOB noted. VSS throughout. Pt does endorse blurred vision (worse than baseline). She will benefit from continued acute OT services, anticipate steady progress and suspect pt will not require OT services after discharge home. Will follow.     Follow Up Recommendations  No OT follow up;Supervision - Intermittent    Equipment Recommendations  None recommended by OT           Precautions / Restrictions Precautions Precautions: Other (comment) (craniotomy) Required Braces or Orthoses: Other Brace Other Brace: head bandage Restrictions Weight Bearing Restrictions: No      Mobility Bed Mobility Overal bed mobility: Needs Assistance Bed Mobility: Sit to Supine       Sit to supine: Supervision   General bed mobility comments: for lines  Transfers Overall transfer level: Needs assistance Equipment used: None Transfers: Sit to/from Stand Sit to Stand: Min guard         General transfer comment: minguard for safety, balance and lines     Balance Overall balance assessment: Mild deficits observed, not formally tested (mild sway with increased mobility )                                         ADL either performed or assessed with clinical judgement   ADL Overall  ADL's : Needs assistance/impaired Eating/Feeding: Modified independent;Sitting   Grooming: Supervision/safety;Standing   Upper Body Bathing: Supervision/ safety;Sitting   Lower Body Bathing: Supervison/ safety;Sit to/from stand   Upper Body Dressing : Supervision/safety;Sitting   Lower Body Dressing: Min guard;Sit to/from stand Lower Body Dressing Details (indicate cue type and reason): pt using figure 4 to adjust socks easily while EOB Toilet Transfer: Min guard;Ambulation   Toileting- Clothing Manipulation and Hygiene: Supervision/safety;Sit to/from stand       Functional mobility during ADLs: Min guard General ADL Comments: pt with mild sway with hallway mobility, denies dizziness/being lightheaded.      Vision Baseline Vision/History: Wears glasses Wears Glasses: Distance only Patient Visual Report: Blurring of vision Additional Comments: pt with L eye swollen, reports blurred vision when attempting to read smaller font of  book/bible, does not improve with closing L or R eye      Perception     Praxis      Pertinent Vitals/Pain Pain Assessment: Faces Faces Pain Scale: Hurts a little bit Pain Location: headache Pain Descriptors / Indicators: Headache Pain Intervention(s): Monitored during session;Repositioned     Hand Dominance Right   Extremity/Trunk Assessment Upper Extremity Assessment Upper Extremity Assessment: Overall WFL for tasks assessed   Lower Extremity Assessment Lower Extremity Assessment: Overall WFL for tasks assessed;Defer to PT evaluation   Cervical / Trunk Assessment Cervical / Trunk Assessment: Other exceptions Cervical / Trunk Exceptions: L frontal incision   Communication Communication  Communication: No difficulties   Cognition Arousal/Alertness: Awake/alert Behavior During Therapy: WFL for tasks assessed/performed Overall Cognitive Status: Within Functional Limits for tasks assessed                                  General Comments: administered Short Blessed Test with pt receiving score of 0 (indicating normal cognition)    General Comments       Exercises     Shoulder Instructions      Home Living Family/patient expects to be discharged to:: Private residence Living Arrangements: Alone Available Help at Discharge:  (none - children aren't vaccinated) Type of Home: Apartment Home Access: Stairs to enter Entrance Stairs-Number of Steps: 3 Entrance Stairs-Rails: Can reach both Home Layout: One level     Bathroom Shower/Tub: Teacher, early years/pre: Standard     Home Equipment: None          Prior Functioning/Environment Level of Independence: Independent        Comments: works from home 4/5 days/wk, drives        OT Problem List: Decreased activity tolerance;Impaired balance (sitting and/or standing);Pain;Impaired vision/perception      OT Treatment/Interventions: Self-care/ADL training;Therapeutic exercise;Energy conservation;DME and/or AE instruction;Therapeutic activities;Visual/perceptual remediation/compensation;Patient/family education;Balance training    OT Goals(Current goals can be found in the care plan section) Acute Rehab OT Goals Patient Stated Goal: get better OT Goal Formulation: With patient Time For Goal Achievement: 10/25/19 Potential to Achieve Goals: Good  OT Frequency: Min 2X/week   Barriers to D/C:            Co-evaluation              AM-PAC OT "6 Clicks" Daily Activity     Outcome Measure Help from another person eating meals?: None Help from another person taking care of personal grooming?: A Little Help from another person toileting, which includes using toliet, bedpan, or urinal?: A Little Help from another person bathing (including washing, rinsing, drying)?: A Little Help from another person to put on and taking off regular upper body clothing?: A Little Help from another person to put on and taking off regular lower  body clothing?: A Little 6 Click Score: 19   End of Session Equipment Utilized During Treatment: Gait belt Nurse Communication: Mobility status  Activity Tolerance: Patient tolerated treatment well Patient left: in bed;with call bell/phone within reach  OT Visit Diagnosis: Other symptoms and signs involving the nervous system (R29.898)                Time: 9381-8299 OT Time Calculation (min): 22 min Charges:  OT General Charges $OT Visit: 1 Visit OT Evaluation $OT Eval Moderate Complexity: 1 Mod  Lou Cal, OT Acute Rehabilitation Services Pager [phone] Office [phone]   Raymondo Band 10/11/2019, 3:33 PM

## 2019-10-11 NOTE — Evaluation (Signed)
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 PMH: HTN, anxiety   Clinical Impression   Pt admitted with above. Pt functioning near baseline. Pt scored 21 on DGI indicating minimal falls risk. Pt observed in bathroom and was supervision with tolieting, hygiene and donning underwear. Pt was able to stand on 1 leg to put underwear on. Aware pt is going back to surgery on Friday. PT to continue to monitor pt while in hospital and progress indep with mobility as pt with no support at home.    Follow Up Recommendations No PT follow up    Equipment Recommendations  None recommended by PT    Recommendations for Other Services       Precautions / Restrictions Precautions Precautions: Other (comment) (craniotomy) Required Braces or Orthoses: Other Brace Other Brace: head bandage Restrictions Weight Bearing Restrictions: No      Mobility  Bed Mobility               General bed mobility comments: pt up in bathroom with RN, per RN pt got up to EOB on own, without Assist, HOB was elevated  Transfers Overall transfer level: Needs assistance Equipment used: None Transfers: Sit to/from Stand Sit to Stand: Min guard         General transfer comment: pt observed sit to stand from toliet, used bed rail, min guard for safety due to recent brain surgery  Ambulation/Gait Ambulation/Gait assistance: Min guard Gait Distance (Feet): 200 Feet Assistive device: None Gait Pattern/deviations: Step-through pattern;Decreased stride length Gait velocity: slow Gait velocity interpretation: 1.31 - 2.62 ft/sec, indicative of limited community ambulator General Gait  Details: slow and guarded but no LOB  Stairs Stairs: Yes Stairs assistance: Min guard Stair Management: One rail Right;Alternating pattern;Forwards Number of Stairs: 12 General stair comments: no difficulty, no LOB  Wheelchair Mobility    Modified Rankin (Stroke Patients Only)       Balance Overall balance assessment: Mild deficits observed, not formally tested                               Standardized Balance Assessment Standardized Balance Assessment : Dynamic Gait Index   Dynamic Gait Index Level Surface: Normal Change in Gait Speed: Normal Gait with Horizontal Head Turns: Mild Impairment Gait with Vertical Head Turns: Normal Gait and Pivot Turn: Mild Impairment Step Over Obstacle: Normal Step Around Obstacles: Normal Steps: Mild Impairment Total Score: 21       Pertinent Vitals/Pain Pain Assessment: 0-10 Pain Score: 4  Pain Location: headache Pain Descriptors / Indicators: Headache Pain Intervention(s): Monitored during session    Home Living Family/patient expects to be discharged to:: Private residence Living Arrangements: Alone Available Help at Discharge:  (none - children aren't vaccinated) Type of Home: Apartment Home Access: Stairs to enter Entrance Stairs-Rails: Can reach both Entrance Stairs-Number of Steps: 3 Home Layout: One level Home Equipment: None      Prior Function Level of Independence: Independent         Comments: works from home, drives     Hand Dominance   Dominant Hand: Right    Extremity/Trunk Assessment   Upper Extremity Assessment Upper Extremity Assessment:  Overall WFL for tasks assessed    Lower Extremity Assessment Lower Extremity Assessment: Overall WFL for tasks assessed    Cervical / Trunk Assessment Cervical / Trunk Assessment: Other exceptions Cervical / Trunk Exceptions: L frontal incision  Communication   Communication: No difficulties  Cognition Arousal/Alertness:  Awake/alert Behavior During Therapy: WFL for tasks assessed/performed Overall Cognitive Status: Within Functional Limits for tasks assessed                                        General Comments General comments (skin integrity, edema, etc.): VSS, some facial/eye swelling L >R as suspected from surgery    Exercises     Assessment/Plan    PT Assessment Patient needs continued PT services  PT Problem List Decreased activity tolerance;Decreased balance;Decreased mobility       PT Treatment Interventions DME instruction;Gait training;Stair training;Functional mobility training;Therapeutic activities;Therapeutic exercise;Balance training;Neuromuscular re-education;Cognitive remediation    PT Goals (Current goals can be found in the Care Plan section)  Acute Rehab PT Goals Patient Stated Goal: get better PT Goal Formulation: With patient Time For Goal Achievement: 10/25/19 Potential to Achieve Goals: Good Additional Goals Additional Goal #1: Pt to score >42 on Berg Balance test to indicate minimal falls risk.    Frequency Min 2X/week   Barriers to discharge        Co-evaluation               AM-PAC PT "6 Clicks" Mobility  Outcome Measure Help needed turning from your back to your side while in a flat bed without using bedrails?: None Help needed moving from lying on your back to sitting on the side of a flat bed without using bedrails?: None Help needed moving to and from a bed to a chair (including a wheelchair)?: None Help needed standing up from a chair using your arms (e.g., wheelchair or bedside chair)?: None Help needed to walk in hospital room?: None Help needed climbing 3-5 steps with a railing? : A Little 6 Click Score: 23    End of Session Equipment Utilized During Treatment: Gait belt Activity Tolerance: Patient tolerated treatment well Patient left: in chair (in w/c with transporter to MRI) Nurse Communication: Mobility status PT Visit  Diagnosis: Unsteadiness on feet (R26.81)    Time: 6226-3335 PT Time Calculation (min) (ACUTE ONLY): 13 min   Charges:   PT Evaluation $PT Eval Low Complexity: 1 Low          Kittie Plater, PT, DPT Acute Rehabilitation Services Pager #: [phone] Office #: [phone]   Berline Lopes 10/11/2019, 10:02 AM

## 2019-10-11 NOTE — Progress Notes (Signed)
  NEUROSURGERY PROGRESS NOTE   No issues overnight. Complains of some headache No N/T/W  EXAM:  BP (!) 94/58   Pulse 69   Temp 98.5 F (36.9 C) (Oral)   Resp 11   Ht 5\' 7"  (1.702 m)   Wt 71.7 kg   LMP 07/09/2015   SpO2 100%   BMI 24.75 kg/m   Awake, alert, oriented  Speech fluent, appropriate  CN grossly intact  5/5 BUE/BLE  Head wrap in place  IMPRESSION/PLAN 54 y.o. female POD1 craniectomy for tumor resection. Doing well. Plan for cranioplasty Friday - PT/OT - remove foley

## 2019-10-12 LAB — GLUCOSE, CAPILLARY
Glucose-Capillary: 99 mg/dL (ref 70–99)
Glucose-Capillary: 112 mg/dL — ABNORMAL HIGH (ref 70–99)
Glucose-Capillary: 95 mg/dL (ref 70–99)
Glucose-Capillary: 99 mg/dL (ref 70–99)

## 2019-10-12 MED ORDER — OXYCODONE HCL 5 MG PO TABS
5.0000 mg | ORAL_TABLET | ORAL | Status: DC | PRN
  Administered 2019-10-13: 5 mg via ORAL
  Administered 2019-10-14 – 2019-10-15 (×4): 10 mg via ORAL
  Filled 2019-10-12 (×2): qty 2
  Filled 2019-10-12: qty 1
  Filled 2019-10-12 (×2): qty 2

## 2019-10-12 MED ORDER — BUTALBITAL-APAP-CAFFEINE 50-325-40 MG PO TABS: 1.0000 | ORAL_TABLET | ORAL | Status: DC | PRN

## 2019-10-12 NOTE — Progress Notes (Signed)
  NEUROSURGERY PROGRESS NOTE   Pt seen and examined. No issues overnight. No complaints this am, minimal HA currently.   EXAM: Temp:  [98.5 F (36.9 C)-100.6 F (38.1 C)] 100.3 F (37.9 C) (09/22 0400) Pulse Rate:  [68-117] 70 (09/22 0600) Resp:  [9-23] 10 (09/22 0600) BP: (87-106)/(49-72) 99/60 (09/22 0600) SpO2:  [94 %-100 %] 99 % (09/22 0600) Intake/Output      09/21 0701 - 09/22 0700 09/22 0701 - 09/23 0700   P.O. 600    I.V. (mL/kg) 105.6 (1.5)    Blood     Other     IV Piggyback     Total Intake(mL/kg) 705.6 (9.8)    Urine (mL/kg/hr)     Blood     Total Output     Net +705.6         Urine Occurrence 7 x 1 x     Awake, alert, oriented Speech fluent, appropriate CN grossly intact 5/5 BUE/BLE Wound c/d/i   LABS: Lab Results  Component Value Date   CREATININE 0.82 10/11/2019   BUN 10 10/11/2019   NA 141 10/11/2019   K 4.2 10/11/2019   CL 109 10/11/2019   CO2 23 10/11/2019   Lab Results  Component Value Date   WBC 16.6 (H) 10/11/2019   HGB 9.1 (L) 10/11/2019   HCT 26.5 (L) 10/11/2019   MCV 88.6 10/11/2019   PLT 155 10/11/2019    IMAGING: MRI reviewed demonstrating gross total resection of tumor, improved mass effect on L frontal pole, no HCP. No diffusion restriction to suggest stroke or venous infarction.  IMPRESSION: - 54 y.o. female POD#2 s/p resection extra-axial left frontal tumor, at neurologic baseline doing well.  PLAN: - Cont supportive care - OOB ad lib - Cont Keppra - Cranioplasty on Friday  Consuella Lose, MD [facility]

## 2019-10-13 LAB — GLUCOSE, CAPILLARY
Glucose-Capillary: 77 mg/dL (ref 70–99)
Glucose-Capillary: 78 mg/dL (ref 70–99)
Glucose-Capillary: 85 mg/dL (ref 70–99)
Glucose-Capillary: 100 mg/dL — ABNORMAL HIGH (ref 70–99)

## 2019-10-13 NOTE — Progress Notes (Signed)
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 PMH: HTN, anxiety    PT Comments    Pt tolerated treatment well, ambulating for community distances without significant gait deviation or loss of balance. Pt does demonstrate increased sway without loss of balance with eyes closed during static standing. PT provides education on the use of night lights or shower seat to reduce falls risk when vision is compromised at home. PT plans to check back on the patient after cranioplasty to ensure gait and balance remain at an appropriate level for discharge home alone. PT recommends discharge home, no PT services, pt will benefit from obtaining a shower seat.   Follow Up Recommendations  No PT follow up     Equipment Recommendations  Other (comment) (shower seat)    Recommendations for Other Services       Precautions / Restrictions Precautions Precautions: Other (comment) (craniotomy) Other Brace: head bandage Restrictions Weight Bearing Restrictions: No    Mobility  Bed Mobility Overal bed mobility: Independent                Transfers Overall transfer level: Independent                  Ambulation/Gait Ambulation/Gait assistance: Supervision Gait Distance (Feet): 600 Feet Assistive device: None Gait Pattern/deviations: WFL(Within Functional Limits) Gait velocity: functional Gait velocity interpretation: 1.31 - 2.62 ft/sec, indicative of limited community ambulator General Gait Details: pt with steady step through gait pattern, PT assessed DGI listed below   Stairs Stairs: Yes Stairs assistance: Modified independent (Device/Increase  time) Stair Management: One rail Right;Alternating pattern Number of Stairs: 12     Wheelchair Mobility    Modified Rankin (Stroke Patients Only)       Balance Overall balance assessment: Needs assistance Sitting-balance support: No upper extremity supported;Feet supported Sitting balance-Leahy Scale: Normal     Standing balance support: No upper extremity supported Standing balance-Leahy Scale: Good                 High Level Balance Comments: pt with increased sway with eyes closed however no LOB noted for 30 seconds Standardized Balance Assessment Standardized Balance Assessment : Dynamic Gait Index   Dynamic Gait Index Level Surface: Normal Change in Gait Speed: Normal Gait with Horizontal Head Turns: Normal Gait with Vertical Head Turns: Mild Impairment Gait and Pivot Turn: Normal Step Over Obstacle: Normal Step Around Obstacles: Normal Steps: Normal Total Score: 23      Cognition Arousal/Alertness: Awake/alert Behavior During Therapy: WFL for tasks assessed/performed Overall Cognitive Status: Within Functional Limits for tasks assessed                                        Exercises      General Comments General comments (skin integrity, edema, etc.): VSS on RA      Pertinent Vitals/Pain Pain Assessment: Faces Faces Pain Scale: Hurts little more Pain Location: headache Pain Descriptors / Indicators: Headache Pain Intervention(s): Monitored during session    Home Living  Prior Function            PT Goals (current goals can now be found in the care plan section) Acute Rehab PT Goals Patient Stated Goal: get better Progress towards PT goals: Progressing toward goals    Frequency    Min 2X/week      PT Plan Current plan remains appropriate    Co-evaluation              AM-PAC PT "6 Clicks" Mobility   Outcome Measure  Help needed turning from your back to your side while  in a flat bed without using bedrails?: None Help needed moving from lying on your back to sitting on the side of a flat bed without using bedrails?: None Help needed moving to and from a bed to a chair (including a wheelchair)?: None Help needed standing up from a chair using your arms (e.g., wheelchair or bedside chair)?: None Help needed to walk in hospital room?: None Help needed climbing 3-5 steps with a railing? : None 6 Click Score: 24    End of Session   Activity Tolerance: Patient tolerated treatment well Patient left: in chair Nurse Communication: Mobility status PT Visit Diagnosis: Unsteadiness on feet (R26.81)     Time: 1610-9604 PT Time Calculation (min) (ACUTE ONLY): 25 min  Charges:  $Gait Training: 23-37 mins                     Zenaida Niece, PT, DPT Acute Rehabilitation Pager: [phone]    Zenaida Niece 10/13/2019, 11:01 AM

## 2019-10-13 NOTE — Progress Notes (Signed)
  NEUROSURGERY PROGRESS NOTE   Pt seen and examined. No issues overnight.  EXAM: Temp:  [98.9 F (37.2 C)-99.7 F (37.6 C)] 99.2 F (37.3 C) (09/23 0400) Pulse Rate:  [62-84] 62 (09/23 0600) Resp:  [7-20] 14 (09/23 0600) BP: (86-117)/(53-85) 91/60 (09/23 0600) SpO2:  [97 %-100 %] 100 % (09/23 0600) Intake/Output      09/22 0701 - 09/23 0700 09/23 0701 - 09/24 0700   P.O. 640    I.V. (mL/kg) 0 (0)    Total Intake(mL/kg) 640 (8.9)    Net +640         Urine Occurrence 4 x    Stool Occurrence 2 x      Awake, alert, oriented Speech fluent, appropriate CN grossly intact 5/5 BUE/BLE Headwrap c/d/i   LABS: Lab Results  Component Value Date   CREATININE 0.82 10/11/2019   BUN 10 10/11/2019   NA 141 10/11/2019   K 4.2 10/11/2019   CL 109 10/11/2019   CO2 23 10/11/2019   Lab Results  Component Value Date   WBC 16.6 (H) 10/11/2019   HGB 9.1 (L) 10/11/2019   HCT 26.5 (L) 10/11/2019   MCV 88.6 10/11/2019   PLT 155 10/11/2019    IMPRESSION: - 54 y.o. female POD#3 s/p resection extra-axial left frontal tumor, doing well.  PLAN: - Cont supportive care - OOB ad lib - Cont Keppra - Cranioplasty tomorrow  Consuella Lose, MD [facility]

## 2019-10-14 ENCOUNTER — Inpatient Hospital Stay (HOSPITAL_COMMUNITY): Payer: BC Managed Care – PPO | Admitting: Certified Registered Nurse Anesthetist

## 2019-10-14 ENCOUNTER — Ambulatory Visit (HOSPITAL_COMMUNITY): Admission: RE | Admit: 2019-10-14 | Payer: BC Managed Care – PPO | Source: Home / Self Care | Admitting: Neurosurgery

## 2019-10-14 ENCOUNTER — Encounter (HOSPITAL_COMMUNITY): Payer: Self-pay | Admitting: Neurosurgery

## 2019-10-14 ENCOUNTER — Encounter (HOSPITAL_COMMUNITY)
Admission: RE | Disposition: A | Discharge status: Home / Self Care | Payer: Self-pay | Source: Home / Self Care | Attending: Neurosurgery

## 2019-10-14 HISTORY — PX: CRANIOPLASTY: SHX1407

## 2019-10-14 LAB — BPAM RBC
Blood Product Expiration Date: 202110212359
Blood Product Expiration Date: 202110232359
Blood Product Expiration Date: 202110232359
Blood Product Expiration Date: 202110232359
ISSUE DATE / TIME: 202109200543
ISSUE DATE / TIME: 202109200556
ISSUE DATE / TIME: 202109200757
ISSUE DATE / TIME: 202109200757
Unit Type and Rh: 5100
Unit Type and Rh: 5100
Unit Type and Rh: 5100
Unit Type and Rh: 5100

## 2019-10-14 LAB — TYPE AND SCREEN
ABO/RH(D): O POS
ABO/RH(D): O POS
Antibody Screen: NEGATIVE
Antibody Screen: NEGATIVE
Unit division: 0
Unit division: 0
Unit division: 0
Unit division: 0

## 2019-10-14 LAB — GLUCOSE, CAPILLARY
Glucose-Capillary: 96 mg/dL (ref 70–99)
Glucose-Capillary: 118 mg/dL — ABNORMAL HIGH (ref 70–99)
Glucose-Capillary: 85 mg/dL (ref 70–99)

## 2019-10-14 LAB — SURGICAL PCR SCREEN
MRSA, PCR: NEGATIVE
Staphylococcus aureus: NEGATIVE

## 2019-10-14 SURGERY — CRANIOPLASTY
Anesthesia: General | Site: Head | Laterality: Left

## 2019-10-14 MED ORDER — FENTANYL CITRATE (PF) 100 MCG/2ML IJ SOLN
INTRAMUSCULAR | Status: DC | PRN
Start: 2019-10-14 — End: 2019-10-14
  Administered 2019-10-14: 50 ug via INTRAVENOUS
  Administered 2019-10-14: 100 ug via INTRAVENOUS
  Administered 2019-10-14 (×2): 50 ug via INTRAVENOUS

## 2019-10-14 MED ORDER — PHENYLEPHRINE HCL-NACL 10-0.9 MG/250ML-% IV SOLN
INTRAVENOUS | Status: DC | PRN
  Administered 2019-10-14: 50 ug/min via INTRAVENOUS

## 2019-10-14 MED ORDER — CEFAZOLIN SODIUM-DEXTROSE 2-3 GM-%(50ML) IV SOLR
INTRAVENOUS | Status: DC | PRN
  Administered 2019-10-14: 2 g via INTRAVENOUS

## 2019-10-14 MED ORDER — DEXAMETHASONE SODIUM PHOSPHATE 10 MG/ML IJ SOLN
INTRAMUSCULAR | Status: DC | PRN
  Administered 2019-10-14: 4 mg via INTRAVENOUS

## 2019-10-14 MED ORDER — SODIUM CHLORIDE 0.9 % IV SOLN: INTRAVENOUS | Status: DC

## 2019-10-14 MED ORDER — HYDROMORPHONE HCL 1 MG/ML IJ SOLN
INTRAMUSCULAR | Status: AC
Start: 2019-10-14 — End: 2019-10-14
  Administered 2019-10-14: 0.5 mg via INTRAVENOUS
  Filled 2019-10-14: qty 1

## 2019-10-14 MED ORDER — CHLORHEXIDINE GLUCONATE 0.12 % MT SOLN
15.0000 mL | OROMUCOSAL | Status: AC
  Administered 2019-10-14: 15 mL via OROMUCOSAL
  Filled 2019-10-14: qty 15

## 2019-10-14 MED ORDER — SUGAMMADEX SODIUM 200 MG/2ML IV SOLN
INTRAVENOUS | Status: DC | PRN
  Administered 2019-10-14: 200 mg via INTRAVENOUS

## 2019-10-14 MED ORDER — 0.9 % SODIUM CHLORIDE (POUR BTL) OPTIME
TOPICAL | Status: DC | PRN
  Administered 2019-10-14: 3000 mL

## 2019-10-14 MED ORDER — CHLORHEXIDINE GLUCONATE 0.12 % MT SOLN
OROMUCOSAL | Status: AC
  Filled 2019-10-14: qty 15

## 2019-10-14 MED ORDER — ACETAMINOPHEN 10 MG/ML IV SOLN
INTRAVENOUS | Status: AC
  Administered 2019-10-14: 1000 mg via INTRAVENOUS
  Filled 2019-10-14: qty 100

## 2019-10-14 MED ORDER — ROCURONIUM BROMIDE 10 MG/ML (PF) SYRINGE
PREFILLED_SYRINGE | INTRAVENOUS | Status: DC | PRN
  Administered 2019-10-14: 30 mg via INTRAVENOUS
  Administered 2019-10-14: 40 mg via INTRAVENOUS

## 2019-10-14 MED ORDER — PROPOFOL 10 MG/ML IV BOLUS
INTRAVENOUS | Status: DC | PRN
  Administered 2019-10-14: 150 mg via INTRAVENOUS

## 2019-10-14 MED ORDER — PHENYLEPHRINE 40 MCG/ML (10ML) SYRINGE FOR IV PUSH (FOR BLOOD PRESSURE SUPPORT)
PREFILLED_SYRINGE | INTRAVENOUS | Status: DC | PRN
  Administered 2019-10-14 (×2): 120 ug via INTRAVENOUS
  Administered 2019-10-14: 80 ug via INTRAVENOUS

## 2019-10-14 MED ORDER — THROMBIN 5000 UNITS EX SOLR: OROMUCOSAL | Status: DC | PRN

## 2019-10-14 MED ORDER — HYDROMORPHONE HCL 1 MG/ML IJ SOLN
0.2500 mg | INTRAMUSCULAR | Status: DC | PRN
  Administered 2019-10-14: 0.5 mg via INTRAVENOUS

## 2019-10-14 MED ORDER — LIDOCAINE 2% (20 MG/ML) 5 ML SYRINGE
INTRAMUSCULAR | Status: DC | PRN
  Administered 2019-10-14: 60 mg via INTRAVENOUS

## 2019-10-14 MED ORDER — PROMETHAZINE HCL 25 MG/ML IJ SOLN: 6.2500 mg | INTRAMUSCULAR | Status: DC | PRN

## 2019-10-14 MED ORDER — OXYCODONE HCL 5 MG PO TABS: 5.0000 mg | ORAL_TABLET | Freq: Once | ORAL | Status: DC | PRN

## 2019-10-14 MED ORDER — BACITRACIN ZINC 500 UNIT/GM EX OINT
TOPICAL_OINTMENT | CUTANEOUS | Status: DC | PRN
  Administered 2019-10-14: 1 via TOPICAL

## 2019-10-14 MED ORDER — ACETAMINOPHEN 10 MG/ML IV SOLN: 1000.0000 mg | Freq: Once | INTRAVENOUS | Status: DC | PRN

## 2019-10-14 MED ORDER — OXYCODONE HCL 5 MG/5ML PO SOLN: 5.0000 mg | Freq: Once | ORAL | Status: DC | PRN

## 2019-10-14 SURGICAL SUPPLY — 45 items
BNDG CMPR 75X41 PLY ABS (GAUZE/BANDAGES/DRESSINGS) ×1
BNDG GAUZE ELAST 4 BULKY (GAUZE/BANDAGES/DRESSINGS) ×2 IMPLANT
BNDG STRETCH 4X75 NS LF (GAUZE/BANDAGES/DRESSINGS) ×1 IMPLANT
CANISTER SUCT 3000ML PPV (MISCELLANEOUS) ×2 IMPLANT
DIFFUSER DRILL AIR PNEUMATIC (MISCELLANEOUS) ×2 IMPLANT
DRAPE NEUROLOGICAL W/INCISE (DRAPES) ×2 IMPLANT
DRAPE WARM FLUID 44X44 (DRAPES) ×2 IMPLANT
DRSG TELFA 3X8 NADH (GAUZE/BANDAGES/DRESSINGS) ×2 IMPLANT
DURAPREP 6ML APPLICATOR 50/CS (WOUND CARE) ×2 IMPLANT
ELECT REM PT RETURN 9FT ADLT (ELECTROSURGICAL) ×2
ELECTRODE REM PT RTRN 9FT ADLT (ELECTROSURGICAL) ×1 IMPLANT
GAUZE SPONGE 4X4 12PLY STRL (GAUZE/BANDAGES/DRESSINGS) ×2 IMPLANT
GLOVE BIO SURGEON STRL SZ7 (GLOVE) ×2 IMPLANT
GLOVE BIOGEL PI IND STRL 7.0 (GLOVE) IMPLANT
GLOVE BIOGEL PI IND STRL 7.5 (GLOVE) ×1 IMPLANT
GLOVE BIOGEL PI INDICATOR 7.0 (GLOVE) ×2
GLOVE BIOGEL PI INDICATOR 7.5 (GLOVE) ×2
GLOVE ECLIPSE 7.0 STRL STRAW (GLOVE) ×4 IMPLANT
GOWN STRL REUS W/ TWL LRG LVL3 (GOWN DISPOSABLE) ×2 IMPLANT
GOWN STRL REUS W/ TWL XL LVL3 (GOWN DISPOSABLE) IMPLANT
GOWN STRL REUS W/TWL 2XL LVL3 (GOWN DISPOSABLE) IMPLANT
GOWN STRL REUS W/TWL LRG LVL3 (GOWN DISPOSABLE) ×6
GOWN STRL REUS W/TWL XL LVL3 (GOWN DISPOSABLE) ×4
HEMOSTAT POWDER KIT SURGIFOAM (HEMOSTASIS) ×1 IMPLANT
IMPL PEEK CRANIAL 100-150 (Neuro Prosthesis/Implant) IMPLANT
IMPLANT PEEK CRANIAL 100-150 (Neuro Prosthesis/Implant) ×2 IMPLANT
KIT BASIN OR (CUSTOM PROCEDURE TRAY) ×2 IMPLANT
KIT TURNOVER KIT B (KITS) ×2 IMPLANT
NDL HYPO 25X1 1.5 SAFETY (NEEDLE) ×1 IMPLANT
NEEDLE HYPO 25X1 1.5 SAFETY (NEEDLE) ×2 IMPLANT
NS IRRIG 1000ML POUR BTL (IV SOLUTION) ×2 IMPLANT
PACK CRANIOTOMY CUSTOM (CUSTOM PROCEDURE TRAY) ×2 IMPLANT
PAD DRESSING TELFA 3X8 NADH (GAUZE/BANDAGES/DRESSINGS) IMPLANT
PLATE BONE 12 2H TARGET XL (Plate) ×4 IMPLANT
SCREW UNIII AXS SD 1.5X4 (Screw) ×8 IMPLANT
SPONGE SURGIFOAM ABS GEL 100 (HEMOSTASIS) ×2 IMPLANT
STAPLER VISISTAT 35W (STAPLE) ×2 IMPLANT
SUT VIC AB 0 CT1 18XCR BRD8 (SUTURE) ×2 IMPLANT
SUT VIC AB 0 CT1 8-18 (SUTURE) ×4
SUT VIC AB 3-0 SH 8-18 (SUTURE) ×7 IMPLANT
TOWEL GREEN STERILE (TOWEL DISPOSABLE) ×2 IMPLANT
TOWEL GREEN STERILE FF (TOWEL DISPOSABLE) ×2 IMPLANT
TUBE CONNECTING 12X1/4 (SUCTIONS) ×2 IMPLANT
UNDERPAD 30X36 HEAVY ABSORB (UNDERPADS AND DIAPERS) ×2 IMPLANT
WATER STERILE IRR 1000ML POUR (IV SOLUTION) ×4 IMPLANT

## 2019-10-14 NOTE — Transfer of Care (Signed)
Immediate Anesthesia Transfer of Care Note  Patient: Alexandra [name]  Procedure(s) Performed: LEFT CRANIOPLASTY WITH PLACEMENT OF CUSTOM CRANIAL IMPLANT (Left Head)  Patient Location: PACU  Anesthesia Type:General  Level of Consciousness: awake and alert   Airway & Oxygen Therapy: Patient Spontanous Breathing and Patient connected to face mask oxygen  Post-op Assessment: Report given to RN and Post -op Vital signs reviewed and stable  Post vital signs: Reviewed and stable  Last Vitals:  Vitals Value Taken Time  BP    Temp    Pulse    Resp    SpO2      Last Pain:  Vitals:   10/14/19 0851  TempSrc:   PainSc: 3       Patients Stated Pain Goal: 6 (70/34/03 5248)  Complications: No complications documented.

## 2019-10-14 NOTE — Anesthesia Preprocedure Evaluation (Addendum)
Anesthesia Evaluation  Patient identified by MRN, date of birth, ID band Patient awake    Reviewed: Allergy & Precautions, NPO status , Patient's Chart, lab work & pertinent test results  Airway Mallampati: III  TM Distance: >3 FB Neck ROM: Full    Dental no notable dental hx.    Pulmonary neg pulmonary ROS,    Pulmonary exam normal breath sounds clear to auscultation       Cardiovascular hypertension, Pt. on medications Normal cardiovascular exam Rhythm:Regular Rate:Normal  ECG: SR, rate 64   Neuro/Psych Anxiety    GI/Hepatic negative GI ROS, Neg liver ROS,   Endo/Other  negative endocrine ROS  Renal/GU negative Renal ROS     Musculoskeletal negative musculoskeletal ROS (+)   Abdominal   Peds  Hematology  (+) anemia , HLD   Anesthesia Other Findings Brain tumor  Reproductive/Obstetrics                            Anesthesia Physical Anesthesia Plan  ASA: III  Anesthesia Plan: General   Post-op Pain Management:    Induction: Intravenous  PONV Risk Score and Plan: 3 and Ondansetron, Dexamethasone, Treatment may vary due to age or medical condition and Propofol infusion  Airway Management Planned: Oral ETT  Additional Equipment:   Intra-op Plan:   Post-operative Plan: Extubation in OR  Informed Consent: I have reviewed the patients History and Physical, chart, labs and discussed the procedure including the risks, benefits and alternatives for the proposed anesthesia with the patient or authorized representative who has indicated his/her understanding and acceptance.     Dental advisory given  Plan Discussed with: CRNA  Anesthesia Plan Comments:        Anesthesia Quick Evaluation

## 2019-10-14 NOTE — Progress Notes (Signed)
  NEUROSURGERY PROGRESS NOTE   No issues overnight. Pt without complaints today.  EXAM:  BP 108/70   Pulse 80   Temp 99.9 F (37.7 C) (Oral)   Resp 17   Ht 5\' 7"  (1.702 m)   Wt 71.7 kg   LMP 07/09/2015   SpO2 100%   BMI 24.75 kg/m   Awake, alert, oriented  Speech fluent, appropriate  CN grossly intact  5/5 BUE/BLE   IMPRESSION:  54 y.o. female s/p craniectomy for tumor resection, doing well  PLAN: - Proceed with cranioplasty today  I have reviewed the procedure, risks, benefits, and alternatives and expected postop course. All questions were answered and consent was obtained.

## 2019-10-14 NOTE — Op Note (Signed)
  NEUROSURGERY OPERATIVE NOTE   PREOP DIAGNOSIS:  1. Left bifrontal cranial defect   POSTOP DIAGNOSIS: Same  PROCEDURE: 1. Bifrontal cranioplasty >5cm  SURGEON: Dr. Consuella Lose, MD  ASSISTANT: Dr. Duffy Rhody, MD  ANESTHESIA: General Endotracheal  EBL: 100cc  SPECIMENS: None  DRAINS: None  COMPLICATIONS: None immediate  CONDITION: Hemodynamically stable to PACU  HISTORY: Alexandra [name] is a 54 y.o. 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  She underwent craniectomy and has been monitored in the intensive care unit.  Custom cranioplasty flap has been fabricated and she presents today for cranioplasty.  Risks, benefits, and expected postoperative course were all reviewed in detail with the patient.  After all her questions were answered informed consent was obtained and witnessed.  PROCEDURE IN DETAIL: The patient was brought to the operating room. After induction of general anesthesia, the patient was positioned on the operative table in the horseshoe head holder in the supine position. All pressure points were meticulously padded.  Previous skin incision was then marked out and prepped and draped in the usual sterile fashion.  After timeout was conducted, the previous incision was opened by removal of the staples and scissors were used to cut the galeal stitches.  The stitches approximating the temporalis muscle were also cut.  Free ends of the suture were removed.  The skin flap was then elevated.  The edges of the bone were identified.  The cranial implant was then trialed in the defect and found to have an excellent fit.  Multiple dog bone cranial plates were then secured to the bone flap and to the skull with standard titanium screws.  The wound was then irrigated with a copious amount of normal saline irrigation.  There was no active bleeding identified.  Temporalis was again  reapproximated with interrupted 0 Vicryl stitches.  The galea was reapproximated with interrupted 0 and 3-0 Vicryl stitches and the skin was closed with staples.  Bacitracin ointment and sterile dressing was applied.  A head wrap was placed.  At the end of the case all sponge, needle, instrument, and cottonoid counts were correct.  The patient was then transferred to the stretcher, extubated, and taken to the postanesthesia care unit in stable hemodynamic condition.

## 2019-10-14 NOTE — Anesthesia Procedure Notes (Signed)
Procedure Name: Intubation Date/Time: 10/14/2019 10:13 AM Performed by: Inda Coke, CRNA Pre-anesthesia Checklist: Patient identified, Emergency Drugs available, Suction available and Patient being monitored Patient Re-evaluated:Patient Re-evaluated prior to induction Oxygen Delivery Method: Circle System Utilized Preoxygenation: Pre-oxygenation with 100% oxygen Induction Type: IV induction Ventilation: Mask ventilation without difficulty Laryngoscope Size: Mac and 3 Grade View: Grade II Tube type: Oral Tube size: 7.0 mm Number of attempts: 1 Airway Equipment and Method: Stylet and Oral airway Placement Confirmation: ETT inserted through vocal cords under direct vision,  positive ETCO2 and breath sounds checked- equal and bilateral Secured at: 22 cm Tube secured with: Tape Dental Injury: Teeth and Oropharynx as per pre-operative assessment

## 2019-10-15 LAB — GLUCOSE, CAPILLARY
Glucose-Capillary: 156 mg/dL — ABNORMAL HIGH (ref 70–99)
Glucose-Capillary: 77 mg/dL (ref 70–99)

## 2019-10-15 MED ORDER — OXYCODONE-ACETAMINOPHEN 5-325 MG PO TABS: 1.0000 | ORAL_TABLET | ORAL | 0 refills | Status: DC | PRN

## 2019-10-15 MED ORDER — HYDROCODONE-ACETAMINOPHEN 5-325 MG PO TABS
1.0000 | ORAL_TABLET | ORAL | 0 refills | Status: DC | PRN
Start: 2019-10-15 — End: 2019-10-15

## 2019-10-15 NOTE — Discharge Instructions (Signed)
Can remove dressing 48 hours after surgery Walk as much as possible No heavy lifting >10 lbs Ok to shower 9/27 with baby shampoo

## 2019-10-15 NOTE — Progress Notes (Signed)
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 PMH: HTN, anxiety    PT Comments    Pt tolerates treatment well, ambulating and mobilizing independently at this time. Pt's vision appears WFL at this time, pt reports she has been reading and has no concerns about vision at this time. Pt is able to perform trail finding tasks accurately and without cues. Pt has no further acute PT needs at this time at this time. PT continues to recommend pt receive a shower seat for use at home after discharge. Acute PT signing off.   Follow Up Recommendations  No PT follow up     Equipment Recommendations   (shower seat)    Recommendations for Other Services       Precautions / Restrictions Precautions Precautions: None Other Brace: head bandage after cranioplasty Restrictions Weight Bearing Restrictions: No    Mobility  Bed Mobility Overal bed mobility: Independent Bed Mobility: Supine to Sit;Sit to Supine     Supine to sit: Independent Sit to supine: Independent      Transfers Overall transfer level: Independent Equipment used: None Transfers: Sit to/from Stand Sit to Stand: Independent            Ambulation/Gait Ambulation/Gait assistance: Independent Gait Distance (Feet): 1000 Feet Assistive device: None Gait Pattern/deviations: WFL(Within Functional Limits) Gait velocity: functional Gait velocity interpretation: >2.62 ft/sec, indicative of community ambulatory General Gait Details: steady step through gait, performs head turns, changes gait speed, walks sideways and backward without gait deviation   Stairs Stairs: Yes Stairs assistance: Modified  independent (Device/Increase time) Stair Management: One rail Right;Alternating pattern Number of Stairs: 12 General stair comments: no difficulty, no LOB   Wheelchair Mobility    Modified Rankin (Stroke Patients Only)       Balance Overall balance assessment: Independent Sitting-balance support: No upper extremity supported;Feet supported Sitting balance-Leahy Scale: Normal     Standing balance support: No upper extremity supported;During functional activity Standing balance-Leahy Scale: Normal                              Cognition Arousal/Alertness: Awake/alert Behavior During Therapy: WFL for tasks assessed/performed Overall Cognitive Status: Within Functional Limits for tasks assessed                                        Exercises      General Comments General comments (skin integrity, edema, etc.): VSS on RA      Pertinent Vitals/Pain Pain Assessment: Faces Faces Pain Scale: Hurts little more Pain Location: head Pain Descriptors / Indicators: Headache Pain Intervention(s): Monitored during session    Home Living                      Prior Function            PT Goals (current goals can now be found in the care plan section) Acute Rehab PT Goals Patient Stated Goal: get better Progress towards PT goals: Goals met/education completed, patient discharged from PT    Frequency     (D/C PT)  PT Plan Current plan remains appropriate    Co-evaluation              AM-PAC PT "6 Clicks" Mobility   Outcome Measure  Help needed turning from your back to your side while in a flat bed without using bedrails?: None Help needed moving from lying on your back to sitting on the side of a flat bed without using bedrails?: None Help needed moving to and from a bed to a chair (including a wheelchair)?: None Help needed standing up from a chair using your arms (e.g., wheelchair or bedside chair)?: None Help  needed to walk in hospital room?: None Help needed climbing 3-5 steps with a railing? : None 6 Click Score: 24    End of Session   Activity Tolerance: Patient tolerated treatment well Patient left: in bed;with call bell/phone within reach Nurse Communication: Mobility status       Time: 9400-0505 PT Time Calculation (min) (ACUTE ONLY): 18 min  Charges:  $Gait Training: 8-22 mins                     Zenaida Niece, PT, DPT Acute Rehabilitation Pager: [phone]    Zenaida Niece 10/15/2019, 11:06 AM

## 2019-10-15 NOTE — Progress Notes (Signed)
Discharge instructions given to patient with Dr. Marcello Moores at bedside. All questions answered, pt going home with friend.

## 2019-10-15 NOTE — Discharge Summary (Signed)
  Physician Discharge Summary  Patient ID: Alexandra [name] MRN: [id number] DOB/AGE: 08/01/1965 54 y.o.  Admit date: 10/10/2019 Discharge date: 10/15/2019  Admission Diagnoses:  Left skull tumor  Discharge Diagnoses:  Same Active Problems:   Brain tumor [facility])   Discharged Condition: Stable  Hospital Course:  Alexandra [name] is a 54 y.o. 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  She underwent resection of the tumor including craniectomy.  She then had cranioplasty procedure with a customized bone flap that was created after her first surgery.  On postop day #1 following her cranioplasty, she was deemed ready for discharge home.  Discharge instructions were given.  Treatments: Surgery -resection of skull tumor, and cranioplasty  Discharge Exam: Blood pressure (!) 97/58, pulse 69, temperature 98.7 F (37.1 C), temperature source Oral, resp. rate 19, height 5\' 7"  (1.702 m), weight 71.7 kg, last menstrual period 07/09/2015, SpO2 100 %. Awake, alert, oriented Speech fluent, appropriate CN grossly intact 5/5 BUE/BLE Wound c/d/i  Disposition: Discharge disposition: 01-Home or Self Care        Allergies as of 10/15/2019   No Known Allergies     Medication List    TAKE these medications   atorvastatin 10 MG tablet Commonly known as: LIPITOR TAKE 1 TABLET BY MOUTH EVERY DAY   benazepril-hydrochlorthiazide 20-12.5 MG tablet Commonly known as: LOTENSIN HCT TAKE 1 TABLET BY MOUTH EVERY DAY What changed: how much to take   Eszopiclone 3 MG Tabs Take 1 tablet (3 mg total) by mouth at bedtime. Take immediately before bedtime   HYDROcodone-acetaminophen 5-325 MG tablet Commonly known as: NORCO/VICODIN Take 1 tablet by mouth every 4 (four) hours as needed for moderate pain.   Insulin Pen Needle 32G X 4 MM Misc Commonly known as: NovoFine Plus Inject 1 pen into the skin daily. Inject 1 pen with Victoza SQ   levocetirizine 5  MG tablet Commonly known as: XYZAL TAKE 1 TABLET BY MOUTH EVERY DAY IN THE EVENING What changed:   how much to take  how to take this   Ozempic (0.25 or 0.5 MG/DOSE) 2 MG/1.5ML Sopn Generic drug: Semaglutide(0.25 or 0.5MG /DOS) INJECT 0.5 MG TOTAL INTO THE SKIN ONCE A WEEK.   valACYclovir 1000 MG tablet Commonly known as: VALTREX Take 1 tablet (1,000 mg total) by mouth daily. Patient taking it as needed        Signed: Vallarie Mare 10/15/2019, 10:53 AM

## 2019-10-16 NOTE — Anesthesia Postprocedure Evaluation (Signed)
Anesthesia Post Note  Patient: Alexandra [name]  Procedure(s) Performed: LEFT CRANIOPLASTY WITH PLACEMENT OF CUSTOM CRANIAL IMPLANT (Left Head)     Patient location during evaluation: PACU Anesthesia Type: General Level of consciousness: awake Pain management: pain level controlled Vital Signs Assessment: post-procedure vital signs reviewed and stable Respiratory status: spontaneous breathing, nonlabored ventilation, respiratory function stable and patient connected to nasal cannula oxygen Cardiovascular status: blood pressure returned to baseline and stable Postop Assessment: no apparent nausea or vomiting Anesthetic complications: no   No complications documented.  Last Vitals:  Vitals:   10/15/19 1100 10/15/19 1200  BP: 109/71 95/67  Pulse: 63 66  Resp: 12 10  Temp:    SpO2: 100% 100%    Last Pain:  Vitals:   10/15/19 1200  TempSrc: Oral  PainSc: 0-No pain                 Malvin Morrish P Maurisio Ruddy

## 2019-10-18 ENCOUNTER — Encounter: Payer: Self-pay | Admitting: Nurse Practitioner

## 2019-10-18 LAB — SURGICAL PATHOLOGY

## 2019-10-20 ENCOUNTER — Encounter (HOSPITAL_COMMUNITY): Payer: Self-pay | Admitting: Neurosurgery

## 2019-10-26 ENCOUNTER — Telehealth: Payer: Self-pay | Admitting: Internal Medicine

## 2019-10-26 NOTE — Telephone Encounter (Signed)
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 [name] has been cld and scheduled to see Dr. Mickeal Skinner on 10/8 at 10am. Pt aware to arrive 20 minutes early.

## 2019-10-27 ENCOUNTER — Telehealth: Payer: Self-pay | Admitting: Hematology

## 2019-10-27 NOTE — Telephone Encounter (Signed)
Pt has been rescheduled to see Dr. Irene Limbo for medical oncology for plasma cell neoplasm on 10/11 at 11am.

## 2019-10-28 ENCOUNTER — Ambulatory Visit: Payer: BC Managed Care – PPO | Admitting: Internal Medicine

## 2019-10-31 ENCOUNTER — Telehealth: Payer: Self-pay | Admitting: Hematology

## 2019-10-31 ENCOUNTER — Inpatient Hospital Stay: Payer: BC Managed Care – PPO

## 2019-10-31 ENCOUNTER — Inpatient Hospital Stay: Payer: BC Managed Care – PPO | Attending: Internal Medicine | Admitting: Hematology

## 2019-10-31 ENCOUNTER — Other Ambulatory Visit: Payer: Self-pay

## 2019-10-31 VITALS — BP 124/91 | HR 71 | Temp 98.0°F | Resp 18 | Ht 67.0 in | Wt 162.9 lb

## 2019-10-31 DIAGNOSIS — B009 Herpesviral infection, unspecified: Secondary | ICD-10-CM

## 2019-10-31 DIAGNOSIS — D649 Anemia, unspecified: Secondary | ICD-10-CM

## 2019-10-31 DIAGNOSIS — Z87891 Personal history of nicotine dependence: Secondary | ICD-10-CM | POA: Diagnosis not present

## 2019-10-31 DIAGNOSIS — C9030 Solitary plasmacytoma not having achieved remission: Secondary | ICD-10-CM

## 2019-10-31 DIAGNOSIS — E7800 Pure hypercholesterolemia, unspecified: Secondary | ICD-10-CM

## 2019-10-31 DIAGNOSIS — Z8042 Family history of malignant neoplasm of prostate: Secondary | ICD-10-CM | POA: Diagnosis not present

## 2019-10-31 DIAGNOSIS — I10 Essential (primary) hypertension: Secondary | ICD-10-CM | POA: Insufficient documentation

## 2019-10-31 DIAGNOSIS — Z803 Family history of malignant neoplasm of breast: Secondary | ICD-10-CM

## 2019-10-31 DIAGNOSIS — Z794 Long term (current) use of insulin: Secondary | ICD-10-CM | POA: Diagnosis not present

## 2019-10-31 DIAGNOSIS — E119 Type 2 diabetes mellitus without complications: Secondary | ICD-10-CM

## 2019-10-31 LAB — CMP (CANCER CENTER ONLY)
ALT: 18 U/L (ref 0–44)
AST: 16 U/L (ref 15–41)
Albumin: 4.1 g/dL (ref 3.5–5.0)
Alkaline Phosphatase: 65 U/L (ref 38–126)
Anion gap: 4 — ABNORMAL LOW (ref 5–15)
BUN: 13 mg/dL (ref 6–20)
CO2: 30 mmol/L (ref 22–32)
Calcium: 9.7 mg/dL (ref 8.9–10.3)
Chloride: 105 mmol/L (ref 98–111)
Creatinine: 0.76 mg/dL (ref 0.44–1.00)
GFR, Estimated: >60 mL/min
Glucose, Bld: 81 mg/dL (ref 70–99)
Potassium: 3.8 mmol/L (ref 3.5–5.1)
Sodium: 139 mmol/L (ref 135–145)
Total Bilirubin: 0.5 mg/dL (ref 0.3–1.2)
Total Protein: 7.1 g/dL (ref 6.5–8.1)

## 2019-10-31 LAB — CBC WITH DIFFERENTIAL/PLATELET
Abs Immature Granulocytes: 0.01 K/uL (ref 0.00–0.07)
Basophils Absolute: 0.1 K/uL (ref 0.0–0.1)
Basophils Relative: 1 %
Eosinophils Absolute: 0.1 K/uL (ref 0.0–0.5)
Eosinophils Relative: 1 %
HCT: 30.6 % — ABNORMAL LOW (ref 36.0–46.0)
Hemoglobin: 10.1 g/dL — ABNORMAL LOW (ref 12.0–15.0)
Immature Granulocytes: 0 %
Lymphocytes Relative: 29 %
Lymphs Abs: 2.6 K/uL (ref 0.7–4.0)
MCH: 29.7 pg (ref 26.0–34.0)
MCHC: 33 g/dL (ref 30.0–36.0)
MCV: 90 fL (ref 80.0–100.0)
Monocytes Absolute: 0.4 K/uL (ref 0.1–1.0)
Monocytes Relative: 5 %
Neutro Abs: 5.8 K/uL (ref 1.7–7.7)
Neutrophils Relative %: 64 %
Platelets: 325 K/uL (ref 150–400)
RBC: 3.4 MIL/uL — ABNORMAL LOW (ref 3.87–5.11)
RDW: 14.6 % (ref 11.5–15.5)
WBC: 9 K/uL (ref 4.0–10.5)
nRBC: 0 % (ref 0.0–0.2)

## 2019-10-31 LAB — IRON AND TIBC
Iron: 68 ug/dL (ref 41–142)
Saturation Ratios: 22 % (ref 21–57)
TIBC: 303 ug/dL (ref 236–444)
UIBC: 235 ug/dL (ref 120–384)

## 2019-10-31 LAB — FERRITIN: Ferritin: 95 ng/mL (ref 11–307)

## 2019-10-31 LAB — LACTATE DEHYDROGENASE: LDH: 169 U/L (ref 98–192)

## 2019-10-31 LAB — VITAMIN B12: Vitamin B-12: 273 pg/mL (ref 180–914)

## 2019-10-31 NOTE — Progress Notes (Signed)
HEMATOLOGY/ONCOLOGY CONSULTATION NOTE  Date of Service: 10/31/2019  Patient Care Team: [name], FNP as PCP - General (General Practice)  CHIEF COMPLAINTS/PURPOSE OF CONSULTATION:  Plasma Cell Neoplasm  HISTORY OF PRESENTING ILLNESS:   Alexandra [name] is a wonderful 54 y.o. female who has been referred to [place] by Dr. 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 The pt reports that she is doing well overall.   The pt reports that she was taking Benazepril-HCTZ for her HTN but was told to discontinue as she was becoming hypotensive while in the hospital. She continues taking Lipitor for her HLD and was previously on Ozempic for her Type 2 Diabetes. She has no significant family history of cancers or blood disorders. Pt has no known medication allergies. Pt had a hammer toe surgery and a tubal ligation. 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   In mid-June she began to notice a growth on the left side of her forehead. Because the lump continued to grow she visited her PCP at the end of June. Her PCP and Dermatologist thought that it was a lipoma initally. She was sent for a consult with Dr. Harlow Mares at [facility] in August. She received her first cranial MRI on 10/05/2019, which revealed a mass and bone erosion. Pt had her initial Craniectomy on 10/11/2019 and a Left Bifrontal Cranioplasty on 10/14/2019. Pt denies any new bone pain, fevers, chills, or night sweats at the time. Pt has lost 40 lbs over the last year, but this was after the addition of Ozempic and improved dietary and exercise habits. She has started regaining weight after holding Ozempic. At this time pt reports mild discomfort at surgical site and no other pertinent symptoms.  Of note prior to the patient's visit today, pt has had Surgical Pathology [phone]) completed on 10/10/2019 with results revealing "A.  BRAIN TUMOR, LEFT FRONTAL, RESECTION: - Plasma cell neoplasm B. BRAIN TUMOR, LEFT FRONTAL, RESECTION: - Plasma cell neoplasm C. BONE, LEFT FRONTAL, RESECTION: - Plasma cell neoplasm."   Pt has had MRI Brain ([id number]) completed on 10/11/2019 with results revealing "No complicating feature or residual tumor seen after left frontal bone mass resection."  Pt has had MRI Brain ([id number]) completed on 10/05/2019 with results revealing "1. Large extra-axial mass in the anterior left frontal region with bone erosion and extension into the scalp soft tissues. Considerations include hemangiopericytoma, metastasis, and aggressive meningioma. 2. 6 mm enhancing lesion in the clivus. This is indeterminate but does raise concern for metastatic disease. 3. Mild chronic small vessel ischemic disease."  Most recent lab results (10/11/2019) of CBC & BMP is as follows: all values are WNL except for WBC at 16.6K, RBC at 2.99, Hgb at 9.1, HCT at 26.5, Glucose at 104, Calcium at 8.4.  On review of systems, pt reports discomfort at surgicalsite and denies balance issues, memory loss, headaches, fevers, chills, unexpected weight loss and any othe symptoms.   On PMHx the pt reports HTN, HLD, Diabetes, Tubal Ligation, Hammer Toe Surgery, Herpes. On Social Hx the pt reports that she previously smoked on and off but is not currently smoking and does not drink much alcohol. On Family Hx the pt reports a grandfather with prostate cancer.  MEDICAL HISTORY:  Past Medical History:  Diagnosis Date  . Anxiety   . Hypertension   . Pre-diabetes     SURGICAL HISTORY: Past Surgical History:  Procedure Laterality Date  . ABLATION    . APPLICATION OF CRANIAL NAVIGATION N/A 10/10/2019   Procedure: APPLICATION OF CRANIAL NAVIGATION;  Surgeon: Consuella Lose, MD;  Location: [place];  Service: Neurosurgery;  Laterality: N/A;  APPLICATION OF CRANIAL NAVIGATION  . CRANIOPLASTY Left 10/14/2019   Procedure: LEFT CRANIOPLASTY  WITH PLACEMENT OF CUSTOM CRANIAL IMPLANT;  Surgeon: Consuella Lose, MD;  Location: [place];  Service: Neurosurgery;  Laterality: Left;  . CRANIOTOMY Left 10/10/2019   Procedure: Left frontal Stereotactic craniectomy for resection of tumor;  Surgeon: Consuella Lose, MD;  Location: [place];  Service: Neurosurgery;  Laterality: Left;  Left frontal Stereotactic craniectomy for resection of tumor  . HAMMER TOE SURGERY Left   . TUBAL LIGATION      SOCIAL HISTORY: Social History   Socioeconomic History  . Marital status: Single    Spouse name: Not on file  . Number of children: Not on file  . Years of education: Not on file  . Highest education level: Not on file  Occupational History  . Not on file  Tobacco Use  . Smoking status: Never Smoker  . Smokeless tobacco: Never Used  Vaping Use  . Vaping Use: Never used  Substance and Sexual Activity  . Alcohol use: Yes    Comment: occassionally  . Drug use: Not Currently  . Sexual activity: Not on file  Other Topics Concern  . Not on file  Social History Narrative  . Not on file   Social Determinants of Health   Financial Resource Strain:   . Difficulty of Paying Living Expenses: Not on file  Food Insecurity:   . Worried About Charity fundraiser in the Last Year: Not on file  . Ran Out of Food in the Last Year: Not on file  Transportation Needs:   . Lack of Transportation (Medical): Not on file  . Lack of Transportation (Non-Medical): Not on file  Physical Activity:   . Days of Exercise per Week: Not on file  . Minutes of Exercise per Session: Not on file  Stress:   . Feeling of Stress : Not on file  Social Connections:   . Frequency of Communication with Friends and Family: Not on file  . Frequency of Social Gatherings with Friends and Family: Not on file  . Attends Religious Services: Not on file  . Active Member of Clubs or Organizations: Not on file  . Attends Archivist Meetings: Not on file  . Marital  Status: Not on file  Intimate Partner Violence:   . Fear of Current or Ex-Partner: Not on file  . Emotionally Abused: Not on file  . Physically Abused: Not on file  . Sexually Abused: Not on file    FAMILY HISTORY: Family History  Problem Relation Age of Onset  . Breast cancer Maternal Grandmother   . Heart disease Mother   . Hypertension Mother   . Sarcoidosis Father   . Diabetes Maternal Grandfather     ALLERGIES:  has No Known Allergies.  MEDICATIONS:  Current Outpatient Medications  Medication Sig Dispense Refill  . atorvastatin (LIPITOR) 10 MG tablet TAKE 1 TABLET BY MOUTH EVERY DAY (Patient taking differently: Take 10 mg by mouth daily. ) 90 tablet 1  . Eszopiclone 3 MG TABS Take 1 tablet (3 mg total) by mouth at bedtime. Take immediately before bedtime 30 tablet 5  . Insulin Pen Needle (NOVOFINE PLUS) 32G X 4 MM MISC Inject 1 pen into the skin daily. Inject  1 pen with Victoza SQ 100 each 3  . levocetirizine (XYZAL) 5 MG tablet TAKE 1 TABLET BY MOUTH EVERY DAY IN THE EVENING (Patient taking differently: Take 5 mg by mouth every evening. ) 30 tablet 2  . oxyCODONE-acetaminophen (PERCOCET) 5-325 MG tablet Take 1 tablet by mouth every 4 (four) hours as needed for severe pain. 30 tablet 0  . OZEMPIC, 0.25 OR 0.5 MG/DOSE, 2 MG/1.5ML SOPN INJECT 0.5 MG TOTAL INTO THE SKIN ONCE A WEEK. 4.5 mL 1  . valACYclovir (VALTREX) 1000 MG tablet Take 1 tablet (1,000 mg total) by mouth daily. Patient taking it as needed 90 tablet 1  . benazepril-hydrochlorthiazide (LOTENSIN HCT) 20-12.5 MG tablet TAKE 1 TABLET BY MOUTH EVERY DAY (Patient not taking: Reported on 10/31/2019) 90 tablet 1  . oxyCODONE-acetaminophen (PERCOCET) 5-325 MG tablet Take 1 tablet by mouth every 4 (four) hours as needed for severe pain. 30 tablet 0   No current facility-administered medications for this visit.    REVIEW OF SYSTEMS:    10 Point review of Systems was done is negative except as noted above.  PHYSICAL  EXAMINATION: ECOG PERFORMANCE STATUS: 1 - Symptomatic but completely ambulatory  . Vitals:   10/31/19 1114  BP: (!) 124/91  Pulse: 71  Resp: 18  Temp: 98 F (36.7 C)  SpO2: 100%   Filed Weights   10/31/19 1114  Weight: 162 lb 14.4 oz (73.9 kg)   .Body mass index is 25.51 kg/m.  GENERAL:alert, in no acute distress and comfortable SKIN: no acute rashes, no significant lesions EYES: conjunctiva are pink and non-injected, sclera anicteric OROPHARYNX: MMM, no exudates, no oropharyngeal erythema or ulceration NECK: supple, no JVD LYMPH:  no palpable lymphadenopathy in the cervical, axillary or inguinal regions LUNGS: clear to auscultation b/l with normal respiratory effort HEART: regular rate & rhythm ABDOMEN:  normoactive bowel sounds , non tender, not distended. Extremity: no pedal edema PSYCH: alert & oriented x 3 with fluent speech NEURO: no focal motor/sensory deficits  LABORATORY DATA:  I have reviewed the data as listed  . CBC Latest Ref Rng & Units 10/31/2019 10/11/2019 10/10/2019  WBC 4.0 - 10.5 K/uL 9.0 16.6(H) -  Hemoglobin 12.0 - 15.0 g/dL 10.1(L) 9.1(L) 7.8(L)  Hematocrit 36 - 46 % 30.6(L) 26.5(L) 23.0(L)  Platelets 150 - 400 K/uL 325 155 -    . CMP Latest Ref Rng & Units 10/31/2019 10/11/2019 10/10/2019  Glucose 70 - 99 mg/dL 81 104(H) -  BUN 6 - 20 mg/dL 13 10 -  Creatinine 0.44 - 1.00 mg/dL 0.76 0.82 -  Sodium 135 - 145 mmol/L 139 141 143  Potassium 3.5 - 5.1 mmol/L 3.8 4.2 3.4(L)  Chloride 98 - 111 mmol/L 105 109 -  CO2 22 - 32 mmol/L 30 23 -  Calcium 8.9 - 10.3 mg/dL 9.7 8.4(L) -  Total Protein 6.5 - 8.1 g/dL 7.1 - -  Total Bilirubin 0.3 - 1.2 mg/dL 0.5 - -  Alkaline Phos 38 - 126 U/L 65 - -  AST 15 - 41 U/L 16 - -  ALT 0 - 44 U/L 18 - -   10/10/2019 Surgical Pathology [phone]):   RADIOGRAPHIC STUDIES: I have personally reviewed the radiological images as listed and agreed with the findings in the report. CT HEAD W & WO  CONTRAST  Result Date: 10/10/2019 CLINICAL DATA:  Craniotomy, postop. Additional provided: Postop craniotomy for frontal tumor; planning for cranioplasty. EXAM: CT HEAD WITHOUT AND WITH CONTRAST TECHNIQUE: Contiguous axial images were obtained from  the base of the skull through the vertex without and with intravenous contrast CONTRAST:  12m OMNIPAQUE IOHEXOL 300 MG/ML  SOLN COMPARISON:  Brain MRI 10/05/2019.  CT maxillofacial 09/27/2019. FINDINGS: Brain: Postoperative changes from interval bifrontal craniectomy for resection of a previously demonstrated large anterior left frontal region extra-axial mass. Of note, the craniectomy extends to the left frontal sinus. Trace postoperative hemorrhage within the anterior interhemispheric fissure. No definite residual enhancing tumor is identified, although MRI would have greater sensitivity for this indication Moderate left greater than right bifrontal extra-axial pneumocephalus with mild mass effect upon the left greater than right frontal lobes. No demarcated cortical infarct is identified. Vascular: No hyperdense vessel is identified on precontrast imaging. Skull: Interval bifrontal craniectomy as described. Sinuses/Orbits: Visualized orbits show no acute finding. Nonspecific partial opacification of the left frontal sinus. No significant mastoid effusion. Other: Mild bifrontal scalp soft tissue swelling with associated scalp staples. Scattered subcutaneous gas within the anterior scalp soft tissues and left retromaxillary region. IMPRESSION: Postoperative changes from interval bifrontal craniectomy for resection of a previously demonstrated large extra-axial anterior left frontal region mass. No definite residual mass is identified, although MRI would have greater sensitivity for this indication. Trace postoperative hemorrhage within the anterior interhemispheric fissure. Moderate left greater than right bifrontal extra-axial pneumocephalus with mild mass effect  upon the frontal lobes. Previously demonstrated rightward midline shift has essentially resolved. Electronically Signed   By: KKellie SimmeringDO   On: 10/10/2019 19:47   MR BRAIN W WO CONTRAST  Result Date: 10/11/2019 CLINICAL DATA:  Follow-up tumor resection EXAM: MRI HEAD WITHOUT AND WITH CONTRAST TECHNIQUE: Multiplanar, multiecho pulse sequences of the brain and surrounding structures were obtained without and with intravenous contrast. CONTRAST:  7.552mGADAVIST GADOBUTROL 1 MMOL/ML IV SOLN COMPARISON:  10/05/2019 FINDINGS: Brain: Resected left frontal bone mass with improved mass effect on the left frontal lobe where there is only mild postoperative edema along the anterior and inferior cortex. No nodular enhancement to suggest residual. Gas and fluid is seen within the surgical cavity, encompassed by scalp. This causes mild mass effect on the left frontal lobe. No infarct, hydrocephalus, or intracranial collection. Vascular: Normal flow voids and vascular enhancement Skull and upper cervical spine: Frontal skull defect, eccentric to the left, encompassing the resected tumor. Sinuses/Orbits: Unremarkable. No evidence of intra-ocular hemorrhage or proptosis. The small right frontal sinus remains and is aerated appearing. IMPRESSION: No complicating feature or residual tumor seen after left frontal bone mass resection. Electronically Signed   By: JoMonte Fantasia.D.   On: 10/11/2019 10:06   MR BRAIN W WO CONTRAST  Result Date: 10/06/2019 CLINICAL DATA:  Brain tumor.  Forehead mass. EXAM: MRI HEAD WITHOUT AND WITH CONTRAST TECHNIQUE: Multiplanar, multiecho pulse sequences of the brain and surrounding structures were obtained without and with intravenous contrast. CONTRAST:  67m8mADAVIST GADOBUTROL 1 MMOL/ML IV SOLN COMPARISON:  Maxillofacial CT 09/27/2019 FINDINGS: Brain: As seen on the recent CT, there is a large extra-axial mass in the anterior left frontal region measuring 5.3 x 5.6 x 6.7 cm. The mass  enhances solidly aside from a small amount of potential necrosis centrally. The mass erodes through the frontal bone and into the soft tissues of the frontal scalp as well as into the superior aspect of the left frontal sinus. The roof of the left orbit is also partially eroded, however there is no intraorbital extension of the mass. There is suggestion of an associated small dural tail extending laterally over the left frontal  convexity. There is prominent mass effect on the underlying left frontal lobe with 7 mm of localized rightward midline shift and effacement of the frontal horns of both lateral ventricles. There is no ventriculomegaly, and there is also no brain edema. The mass invades the anterior aspect of the superior sagittal sinus with the sinus being widely patent more posteriorly. No acute infarct, intracranial hemorrhage, or extra-axial fluid collection is identified. Small foci of T2 hyperintensity in the cerebral white matter and pons are nonspecific but compatible with mild chronic small vessel ischemic disease. A mildly expanded partially empty sella is noted. No second enhancing intracranial mass is present. Vascular: Major intracranial vascular flow voids are preserved. Skull and upper cervical spine: 6 mm enhancing lesion in the clivus (series 5, image 20). Sinuses/Orbits: No orbital mass. Paranasal sinuses and mastoid air cells are clear. Other: None. IMPRESSION: 1. Large extra-axial mass in the anterior left frontal region with bone erosion and extension into the scalp soft tissues. Considerations include hemangiopericytoma, metastasis, and aggressive meningioma. 2. 6 mm enhancing lesion in the clivus. This is indeterminate but does raise concern for metastatic disease. 3. Mild chronic small vessel ischemic disease. Electronically Signed   By: Logan Bores M.D.   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 -Discussed 10/10/2019 Surgical Pathology [phone]) which revealed Bone Tumor - Plasma cell neoplasm.  -Advised pt that this could be an isolated plasmacytoma or a systemic process - Multiple Myeloma. -Advised pt that if this were just an isolated plasmacytoma, surgery followed by localized radiation would likely be indicated. -Advised pt that Multiple Myeloma would likely require ongoing treatment, as it is not curable. -Advised pt that 1/2 of pt with plasmacytomas develop MM in 5-10 years, but we would not begin systemic treatments until that Dx is confirmed.  -Discussed CRAB criteria: no hypercalcemia, no renal dysfunction, anemia is otherwise explained, no other bone lesions identified at this time.  -Advised pt to avoid exercises that require her to bear down. Other activities like walking are fine. -Discussed w/o needed to r/o Multiple Myeloma - pt agrees with this  -Will get labs today, including 24hr-UPEP -Will get a CT guided BM Bx ASAP -Will get PET/CT in 5 days -Will see back in 2 weeks    FOLLOW UP: Labs today PET/CT in 5 days CT bone marrow aspiration/biopsy ASAP RTC with Dr Irene Limbo in 2 weeks  . Orders Placed This Encounter  Procedures  . NM PET Image Initial (PI) Whole Body    Standing Status:   Future    Standing Expiration Date:   10/30/2020    Order Specific Question:   If indicated for the ordered procedure, I authorize the administration of a radiopharmaceutical per Radiology protocol    Answer:   Yes    Order Specific Question:   Is the patient pregnant?    Answer:   No    Order Specific Question:   Preferred imaging location?    Answer:   [facility]  . 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    Answer:   No    Order Specific Question:   Preferred location?    Answer:   [facility]  . 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    Answer:   No    Order Specific Question:   Preferred location?    Answer:   [facility]  . CBC with Differential/Platelet    Standing Status:   Future    Number of Occurrences:   1    Standing Expiration Date:   10/30/2020  . CMP ([place] only)    Standing Status:   Future    Number of Occurrences:   1    Standing Expiration Date:   10/30/2020  . Multiple Myeloma Panel (SPEP&IFE w/QIG)    Standing Status:   Future    Number of Occurrences:   1    Standing Expiration Date:   10/30/2020  . Kappa/lambda light chains    Standing Status:   Future    Number of Occurrences:   1    Standing Expiration Date:   10/30/2020  . Lactate dehydrogenase    Standing Status:   Future    Number of Occurrences:   1    Standing Expiration Date:   10/30/2020  . Beta 2 microglobulin, serum    Standing Status:   Future     Number of Occurrences:   1    Standing Expiration Date:   10/30/2020  . Ferritin    Standing Status:   Future    Number of Occurrences:   1    Standing Expiration Date:   10/30/2020  . Iron and TIBC    Standing Status:   Future    Number of Occurrences:   1    Standing Expiration Date:   10/30/2020  . Vitamin B12    Standing Status:   Future    Number of Occurrences:   1    Standing Expiration Date:   10/30/2020  . 24-Hr Ur UPEP/UIFE/Light Chains/TP    Standing Status:   Future    Number of Occurrences:   1    Standing Expiration Date:   10/30/2020     All of the patients questions were answered with apparent satisfaction. The patient knows to call the clinic with any problems, questions or concerns.  I spent 45 mins counseling the patient face to face. The total time spent in the appointment was 60 minutes and more than 50% was on  counseling and direct patient cares.    Sullivan Lone MD [place] AAHIVMS [facility] Hematology/Oncology Physician [facility]  (Office):       [phone] (Work cell):  [phone] (Fax):           [phone]  10/31/2019 4:57 PM  I, Yevette Edwards, am acting as a scribe for Dr. Sullivan Lone.   .I have reviewed the above documentation for accuracy and completeness, and I agree with the above. Brunetta Genera MD

## 2019-10-31 NOTE — Patient Instructions (Signed)
Thank you for choosing [facility] to provide your oncology and hematology care.   Should you have questions after your visit to the [facility] ([facility]), please contact this office at [phone] between 8:30 AM and 4:30 PM.  Voice mails left after 4:00 PM may not be returned until the following business day.  Calls received after 4:30 PM will be answered by an off-site Nurse Triage Line.    Prescription Refills:  Please have your pharmacy contact us directly for most prescription requests.  Contact the office directly for refills of narcotics (pain medications). Allow 48-72 hours for refills.  Appointments: Please contact the [facility] scheduling department [phone] for questions regarding [facility] appointment scheduling.  Contact the schedulers with any scheduling changes so that your appointment can be rescheduled in a timely manner.   Central Scheduling for [place] [phone] - Call to schedule procedures such as PET scans, CT scans, MRI, Ultrasound, etc.  To afford each patient quality time with our providers, please arrive 30 minutes before your scheduled appointment time.  If you arrive late for your appointment, you may be asked to reschedule.  We strive to give you quality time with our providers, and arriving late affects you and other patients whose appointments are after yours. If you are a no show for multiple scheduled visits, you may be dismissed from the clinic at the providers discretion.     Resources: [facility] Social Workers [phone] for additional information on assistance programs or assistance connecting with community support programs   [place] DSS  [phone]: Information regarding food stamps, Medicaid, and utility assistance [place] [phone]   [place] [employer]'s shared-ride transportation service for eligible riders who have a disability that prevents them from riding the fixed route bus.   Medicare  Rights Center 800-333-4114 Helps people with Medicare understand their rights and benefits, navigate the Medicare system, and secure the quality healthcare they deserve [employer] 800-227-2345 Assists patients locate various types of support and financial assistance Cancer Care: 1-800-813-HOPE (4673) Provides financial assistance, online support groups, medication/co-pay assistance.   Transportation Assistance for appointments at [facility]: Transportation Coordinator [phone]  Again, thank you for choosing [facility] for your care.       

## 2019-10-31 NOTE — Telephone Encounter (Signed)
Scheduled appointment per 10/11 los. Patient is aware of appointment date and time.

## 2019-11-01 LAB — KAPPA/LAMBDA LIGHT CHAINS
Kappa free light chain: 11.2 mg/L (ref 3.3–19.4)
Kappa, lambda light chain ratio: 0.01 — ABNORMAL LOW (ref 0.26–1.65)
Lambda free light chains: 1545 mg/L — ABNORMAL HIGH (ref 5.7–26.3)

## 2019-11-01 LAB — BETA 2 MICROGLOBULIN, SERUM: Beta-2 Microglobulin: 1.7 mg/L (ref 0.6–2.4)

## 2019-11-03 LAB — MULTIPLE MYELOMA PANEL, SERUM
Albumin SerPl Elph-Mcnc: 3.9 g/dL (ref 2.9–4.4)
Albumin/Glob SerPl: 1.5 (ref 0.7–1.7)
Alpha 1: 0.3 g/dL (ref 0.0–0.4)
Alpha2 Glob SerPl Elph-Mcnc: 0.8 g/dL (ref 0.4–1.0)
B-Globulin SerPl Elph-Mcnc: 1 g/dL (ref 0.7–1.3)
Gamma Glob SerPl Elph-Mcnc: 0.6 g/dL (ref 0.4–1.8)
Globulin, Total: 2.7 g/dL (ref 2.2–3.9)
IgA: 51 mg/dL — ABNORMAL LOW (ref 87–352)
IgG (Immunoglobin G), Serum: 627 mg/dL (ref 586–1602)
IgM (Immunoglobulin M), Srm: 11 mg/dL — ABNORMAL LOW (ref 26–217)
Total Protein ELP: 6.6 g/dL (ref 6.0–8.5)

## 2019-11-04 DIAGNOSIS — C9030 Solitary plasmacytoma not having achieved remission: Secondary | ICD-10-CM | POA: Diagnosis not present

## 2019-11-07 ENCOUNTER — Other Ambulatory Visit: Payer: Self-pay | Admitting: Radiology

## 2019-11-08 ENCOUNTER — Ambulatory Visit (HOSPITAL_COMMUNITY)
Admission: RE | Admit: 2019-11-08 | Discharge: 2019-11-08 | Disposition: A | Discharge status: Home / Self Care | Payer: BC Managed Care – PPO | Source: Ambulatory Visit | Attending: Hematology | Admitting: Hematology

## 2019-11-08 ENCOUNTER — Encounter (HOSPITAL_COMMUNITY): Payer: Self-pay

## 2019-11-08 ENCOUNTER — Other Ambulatory Visit: Payer: Self-pay

## 2019-11-08 DIAGNOSIS — Z79899 Other long term (current) drug therapy: Secondary | ICD-10-CM | POA: Insufficient documentation

## 2019-11-08 DIAGNOSIS — C9030 Solitary plasmacytoma not having achieved remission: Secondary | ICD-10-CM | POA: Insufficient documentation

## 2019-11-08 DIAGNOSIS — D649 Anemia, unspecified: Secondary | ICD-10-CM | POA: Diagnosis not present

## 2019-11-08 DIAGNOSIS — Z8589 Personal history of malignant neoplasm of other organs and systems: Secondary | ICD-10-CM | POA: Diagnosis not present

## 2019-11-08 DIAGNOSIS — Z794 Long term (current) use of insulin: Secondary | ICD-10-CM | POA: Insufficient documentation

## 2019-11-08 DIAGNOSIS — I10 Essential (primary) hypertension: Secondary | ICD-10-CM | POA: Insufficient documentation

## 2019-11-08 DIAGNOSIS — R7303 Prediabetes: Secondary | ICD-10-CM | POA: Diagnosis not present

## 2019-11-08 DIAGNOSIS — Z01812 Encounter for preprocedural laboratory examination: Secondary | ICD-10-CM | POA: Diagnosis present

## 2019-11-08 DIAGNOSIS — Z803 Family history of malignant neoplasm of breast: Secondary | ICD-10-CM | POA: Insufficient documentation

## 2019-11-08 LAB — UPEP/UIFE/LIGHT CHAINS/TP, 24-HR UR
% BETA, Urine: 35.2 %
ALPHA 1 URINE: 1.3 %
Albumin, U: 16.9 %
Alpha 2, Urine: 40.2 %
Free Kappa Lt Chains,Ur: 25.55 mg/L (ref 0.63–113.79)
Free Kappa/Lambda Ratio: 0.02 — ABNORMAL LOW (ref 1.03–31.76)
Free Lambda Lt Chains,Ur: 1332.46 mg/L — ABNORMAL HIGH (ref 0.47–11.77)
GAMMA GLOBULIN URINE: 6.3 %
M-SPIKE %, Urine: 27.4 % — ABNORMAL HIGH
M-Spike, Mg/24 Hr: 361 mg/24 hr — ABNORMAL HIGH
Total Protein, Urine-Ur/day: 1318 mg/24 hr — ABNORMAL HIGH (ref 30–150)
Total Protein, Urine: 51.7 mg/dL
Total Volume: 2550

## 2019-11-08 LAB — CBC WITH DIFFERENTIAL/PLATELET
Abs Immature Granulocytes: 0.01 10*3/uL (ref 0.00–0.07)
Basophils Absolute: 0 10*3/uL (ref 0.0–0.1)
Basophils Relative: 1 %
Eosinophils Absolute: 0.2 10*3/uL (ref 0.0–0.5)
Eosinophils Relative: 3 %
HCT: 31.2 % — ABNORMAL LOW (ref 36.0–46.0)
Hemoglobin: 10.4 g/dL — ABNORMAL LOW (ref 12.0–15.0)
Immature Granulocytes: 0 %
Lymphocytes Relative: 41 %
Lymphs Abs: 2.9 10*3/uL (ref 0.7–4.0)
MCH: 30.3 pg (ref 26.0–34.0)
MCHC: 33.3 g/dL (ref 30.0–36.0)
MCV: 91 fL (ref 80.0–100.0)
Monocytes Absolute: 0.6 10*3/uL (ref 0.1–1.0)
Monocytes Relative: 8 %
Neutro Abs: 3.4 10*3/uL (ref 1.7–7.7)
Neutrophils Relative %: 47 %
Platelets: 254 10*3/uL (ref 150–400)
RBC: 3.43 MIL/uL — ABNORMAL LOW (ref 3.87–5.11)
RDW: 15 % (ref 11.5–15.5)
WBC: 7.1 10*3/uL (ref 4.0–10.5)
nRBC: 0 % (ref 0.0–0.2)

## 2019-11-08 LAB — PROTIME-INR
INR: 1 (ref 0.8–1.2)
Prothrombin Time: 13.1 seconds (ref 11.4–15.2)

## 2019-11-08 LAB — GLUCOSE, CAPILLARY: Glucose-Capillary: 86 mg/dL (ref 70–99)

## 2019-11-08 MED ORDER — MIDAZOLAM HCL 2 MG/2ML IJ SOLN
INTRAMUSCULAR | Status: AC | PRN
  Administered 2019-11-08: 1 mg via INTRAVENOUS

## 2019-11-08 MED ORDER — MIDAZOLAM HCL 2 MG/2ML IJ SOLN
INTRAMUSCULAR | Status: AC
  Filled 2019-11-08: qty 4

## 2019-11-08 MED ORDER — LIDOCAINE HCL (PF) 1 % IJ SOLN
INTRAMUSCULAR | Status: AC | PRN
  Administered 2019-11-08: 10 mL via INTRADERMAL

## 2019-11-08 MED ORDER — FLUMAZENIL 0.5 MG/5ML IV SOLN
INTRAVENOUS | Status: AC
  Filled 2019-11-08: qty 5

## 2019-11-08 MED ORDER — FENTANYL CITRATE (PF) 100 MCG/2ML IJ SOLN
INTRAMUSCULAR | Status: AC
  Filled 2019-11-08: qty 4

## 2019-11-08 MED ORDER — MIDAZOLAM HCL 2 MG/2ML IJ SOLN
INTRAMUSCULAR | Status: AC | PRN
  Administered 2019-11-08 (×3): 1 mg via INTRAVENOUS

## 2019-11-08 MED ORDER — SODIUM CHLORIDE 0.9 % IV SOLN: INTRAVENOUS | Status: DC

## 2019-11-08 MED ORDER — NALOXONE HCL 0.4 MG/ML IJ SOLN
INTRAMUSCULAR | Status: AC
  Filled 2019-11-08: qty 1

## 2019-11-08 MED ORDER — FENTANYL CITRATE (PF) 100 MCG/2ML IJ SOLN
INTRAMUSCULAR | Status: AC | PRN
  Administered 2019-11-08 (×2): 50 ug via INTRAVENOUS

## 2019-11-08 NOTE — Procedures (Signed)
Interventional Radiology Procedure Note  Procedure: CT guided aspirate and core biopsy of right posterior iliac bone Complications: None Recommendations: - Bedrest supine x 1 hrs - OTC's PRN  Pain - Follow biopsy results  Signed,  Davey Bergsma S. Josette Shimabukuro, DO    

## 2019-11-08 NOTE — Discharge Instructions (Signed)
Please call Interventional Radiology clinic [phone] with any questions or concerns.  You may remove your dressing and shower tomorrow.   Bone Marrow Aspiration and Bone Marrow Biopsy, Adult, Care After This sheet gives you information about how to care for yourself after your procedure. Your health care provider may also give you more specific instructions. If you have problems or questions, contact your health care provider. What can I expect after the procedure? After the procedure, it is common to have:  Mild pain and tenderness.  Swelling.  Bruising. Follow these instructions at home: Puncture site care   Follow instructions from your health care provider about how to take care of the puncture site. Make sure you: ? Wash your hands with soap and water before and after you change your bandage (dressing). If soap and water are not available, use hand sanitizer. ? Change your dressing as told by your health care provider.  Check your puncture site every day for signs of infection. Check for: ? More redness, swelling, or pain. ? Fluid or blood. ? Warmth. ? Pus or a bad smell. Activity  Return to your normal activities as told by your health care provider. Ask your health care provider what activities are safe for you.  Do not lift anything that is heavier than 10 lb (4.5 kg), or the limit that you are told, until your health care provider says that it is safe.  Do not drive for 24 hours if you were given a sedative during your procedure. General instructions   Take over-the-counter and prescription medicines only as told by your health care provider.  Do not take baths, swim, or use a hot tub until your health care provider approves. Ask your health care provider if you may take showers. You may only be allowed to take sponge baths.  If directed, put ice on the affected area. To do this: ? Put ice in a plastic bag. ? Place a towel between your skin and the  bag. ? Leave the ice on for 20 minutes, 2-3 times a day.  Keep all follow-up visits as told by your health care provider. This is important. Contact a health care provider if:  Your pain is not controlled with medicine.  You have a fever.  You have more redness, swelling, or pain around the puncture site.  You have fluid or blood coming from the puncture site.  Your puncture site feels warm to the touch.  You have pus or a bad smell coming from the puncture site. Summary  After the procedure, it is common to have mild pain, tenderness, swelling, and bruising.  Follow instructions from your health care provider about how to take care of the puncture site and what activities are safe for you.  Take over-the-counter and prescription medicines only as told by your health care provider.  Contact a health care provider if you have any signs of infection, such as fluid or blood coming from the puncture site. This information is not intended to replace advice given to you by your health care provider. Make sure you discuss any questions you have with your health care provider. Document Revised: 05/25/2018 Document Reviewed: 05/25/2018 Elsevier Patient Education  2020 [employer]   Moderate Conscious Sedation, Adult, Care After These instructions provide you with information about caring for yourself after your procedure. Your health care provider may also give you more specific instructions. Your treatment has been planned according to current medical practices, but problems sometimes occur. Call   your health care provider if you have any problems or questions after your procedure. What can I expect after the procedure? After your procedure, it is common:  To feel sleepy for several hours.  To feel clumsy and have poor balance for several hours.  To have poor judgment for several hours.  To vomit if you eat too soon. Follow these instructions at home: For at least 24 hours after  the procedure:   Do not: ? Participate in activities where you could fall or become injured. ? Drive. ? Use heavy machinery. ? Drink alcohol. ? Take sleeping pills or medicines that cause drowsiness. ? Make important decisions or sign legal documents. ? Take care of children on your own.  Rest. Eating and drinking  Follow the diet recommended by your health care provider.  If you vomit: ? Drink water, juice, or soup when you can drink without vomiting. ? Make sure you have little or no nausea before eating solid foods. General instructions  Have a responsible adult stay with you until you are awake and alert.  Take over-the-counter and prescription medicines only as told by your health care provider.  If you smoke, do not smoke without supervision.  Keep all follow-up visits as told by your health care provider. This is important. Contact a health care provider if:  You keep feeling nauseous or you keep vomiting.  You feel light-headed.  You develop a rash.  You have a fever. Get help right away if:  You have trouble breathing. This information is not intended to replace advice given to you by your health care provider. Make sure you discuss any questions you have with your health care provider. Document Revised: 12/19/2016 Document Reviewed: 04/28/2015 Elsevier Patient Education  2020 [employer] Inc.  

## 2019-11-08 NOTE — Consult Note (Signed)
Chief Complaint: Patient was seen in consultation today for CT guided bone marrow biopsy  Referring Physician(s): Brunetta Genera  Supervising Physician: Corrie Mckusick  Patient Status: [facility] - Out-pt  History of Present Illness: Alexandra [name] is a 54 y.o. 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 isolated plasma cell neoplasm vs multiple myeloma).   Past Medical History:  Diagnosis Date  . Anxiety   . Hypertension   . Pre-diabetes     Past Surgical History:  Procedure Laterality Date  . ABLATION    . APPLICATION OF CRANIAL NAVIGATION N/A 10/10/2019   Procedure: APPLICATION OF CRANIAL NAVIGATION;  Surgeon: Consuella Lose, MD;  Location: [place];  Service: Neurosurgery;  Laterality: N/A;  APPLICATION OF CRANIAL NAVIGATION  . CRANIOPLASTY Left 10/14/2019   Procedure: LEFT CRANIOPLASTY WITH PLACEMENT OF CUSTOM CRANIAL IMPLANT;  Surgeon: Consuella Lose, MD;  Location: [place];  Service: Neurosurgery;  Laterality: Left;  . CRANIOTOMY Left 10/10/2019   Procedure: Left frontal Stereotactic craniectomy for resection of tumor;  Surgeon: Consuella Lose, MD;  Location: [facility];  Service: Neurosurgery;  Laterality: Left;  Left frontal Stereotactic craniectomy for resection of tumor  . HAMMER TOE SURGERY Left   . TUBAL LIGATION      Allergies: Patient has no known allergies.  Medications: Prior to Admission medications   Medication Sig Start Date End Date Taking? Authorizing Provider  atorvastatin (LIPITOR) 10 MG tablet TAKE 1 TABLET BY MOUTH EVERY DAY Patient taking differently: Take 10 mg by mouth daily.  06/14/19  Yes [name], FNP  Eszopiclone 3 MG TABS Take 1 tablet (3 mg total) by mouth at bedtime. Take immediately before bedtime 10/04/19 10/03/20 Yes [name], FNP    levocetirizine (XYZAL) 5 MG tablet TAKE 1 TABLET BY MOUTH EVERY DAY IN THE EVENING Patient taking differently: Take 5 mg by mouth every evening.  07/20/19  Yes [name], FNP  oxyCODONE-acetaminophen (PERCOCET) 5-325 MG tablet Take 1 tablet by mouth every 4 (four) hours as needed for severe pain. 10/15/19 10/14/20 Yes Vallarie Mare, MD  valACYclovir (VALTREX) 1000 MG tablet Take 1 tablet (1,000 mg total) by mouth daily. Patient taking it as needed 10/04/19  Yes  isolated, Janece, FNP  benazepril-hydrochlorthiazide (LOTENSIN HCT) 20-12.5 MG tablet TAKE 1 TABLET BY MOUTH EVERY DAY Patient not taking: Reported on 10/31/2019 01/24/19   [name], FNP  Insulin Pen Needle (NOVOFINE PLUS) 32G X 4 MM MISC Inject 1 pen into the skin daily. Inject 1 pen with Victoza SQ 10/23/17   [name], FNP  oxyCODONE-acetaminophen (PERCOCET) 5-325 MG tablet Take 1 tablet by mouth every 4 (four) hours as needed for severe pain. 10/15/19 10/14/20  Vallarie Mare, MD  OZEMPIC, 0.25 OR 0.5 MG/DOSE, 2 MG/1.5ML SOPN INJECT 0.5 MG TOTAL INTO THE SKIN ONCE A WEEK. 10/04/19   [name], FNP     Family History  Problem Relation Age of Onset  . Breast cancer Maternal Grandmother   . Heart disease Mother   . Hypertension Mother   . Sarcoidosis Father   . Diabetes Maternal Grandfather     Social History   Socioeconomic History  . Marital status: Single    Spouse name: Not on file  . Number of children: Not on file  . Years of education: Not on file  . Highest education level: Not  on file  Occupational History  . Not on file  Tobacco Use  . Smoking status: Never Smoker  . Smokeless tobacco: Never Used  Vaping Use  . Vaping Use: Never used  Substance and Sexual Activity  . Alcohol use: Yes    Comment: occassionally  . Drug use: Not Currently  . Sexual activity: Not on file  Other Topics Concern  . Not on file  Social History Narrative  . Not on file   Social Determinants of Health    Financial Resource Strain:   . Difficulty of Paying Living Expenses: Not on file  Food Insecurity:   . Worried About Charity fundraiser in the Last Year: Not on file  . Ran Out of Food in the Last Year: Not on file  Transportation Needs:   . Lack of Transportation (Medical): Not on file  . Lack of Transportation (Non-Medical): Not on file  Physical Activity:   . Days of Exercise per Week: Not on file  . Minutes of Exercise per Session: Not on file  Stress:   . Feeling of Stress : Not on file  Social Connections:   . Frequency of Communication with Friends and Family: Not on file  . Frequency of Social Gatherings with Friends and Family: Not on file  . Attends Religious Services: Not on file  . Active Member of Clubs or Organizations: Not on file  . Attends Archivist Meetings: Not on file  . Marital Status: Not on file      Review of Systems currently denies fever, headache, chest pain, dyspnea, cough, abdominal/back pain, nausea, vomiting or bleeding  Vital Signs: BP 120/83   Pulse 70   Temp 99.1 F (37.3 C) (Oral)   Resp 16   Ht 5' 7"  (1.702 m)   Wt 162 lb 14.4 oz (73.9 kg)   LMP 07/09/2015   SpO2 100%   BMI 25.51 kg/m   Physical Exam awake, alert.  Chest clear to auscultation bilaterally.  Heart with regular rate and rhythm.  Abdomen soft, positive bowel sounds, nontender.  No lower extremity edema.  Imaging: CT HEAD W & WO CONTRAST  Result Date: 10/10/2019 CLINICAL DATA:  Craniotomy, postop. Additional provided: Postop craniotomy for frontal tumor; planning for cranioplasty. EXAM: CT HEAD WITHOUT AND WITH CONTRAST TECHNIQUE: Contiguous axial images were obtained from the base of the skull through the vertex without and with intravenous contrast CONTRAST:  4m OMNIPAQUE IOHEXOL 300 MG/ML  SOLN COMPARISON:  Brain MRI 10/05/2019.  CT maxillofacial 09/27/2019. FINDINGS: Brain: Postoperative changes from interval bifrontal craniectomy for resection of a  previously demonstrated large anterior left frontal region extra-axial mass. Of note, the craniectomy extends to the left frontal sinus. Trace postoperative hemorrhage within the anterior interhemispheric fissure. No definite residual enhancing tumor is identified, although MRI would have greater sensitivity for this indication Moderate left greater than right bifrontal extra-axial pneumocephalus with mild mass effect upon the left greater than right frontal lobes. No demarcated cortical infarct is identified. Vascular: No hyperdense vessel is identified on precontrast imaging. Skull: Interval bifrontal craniectomy as described. Sinuses/Orbits: Visualized orbits show no acute finding. Nonspecific partial opacification of the left frontal sinus. No significant mastoid effusion. Other: Mild bifrontal scalp soft tissue swelling with associated scalp staples. Scattered subcutaneous gas within the anterior scalp soft tissues and left retromaxillary region. IMPRESSION: Postoperative changes from interval bifrontal craniectomy for resection of a previously demonstrated large extra-axial anterior left frontal region mass. No definite residual mass is identified, although  MRI would have greater sensitivity for this indication. Trace postoperative hemorrhage within the anterior interhemispheric fissure. Moderate left greater than right bifrontal extra-axial pneumocephalus with mild mass effect upon the frontal lobes. Previously demonstrated rightward midline shift has essentially resolved. Electronically Signed   By: Kellie Simmering DO   On: 10/10/2019 19:47   MR BRAIN W WO CONTRAST  Result Date: 10/11/2019 CLINICAL DATA:  Follow-up tumor resection EXAM: MRI HEAD WITHOUT AND WITH CONTRAST TECHNIQUE: Multiplanar, multiecho pulse sequences of the brain and surrounding structures were obtained without and with intravenous contrast. CONTRAST:  7.8m GADAVIST GADOBUTROL 1 MMOL/ML IV SOLN COMPARISON:  10/05/2019 FINDINGS: Brain:  Resected left frontal bone mass with improved mass effect on the left frontal lobe where there is only mild postoperative edema along the anterior and inferior cortex. No nodular enhancement to suggest residual. Gas and fluid is seen within the surgical cavity, encompassed by scalp. This causes mild mass effect on the left frontal lobe. No infarct, hydrocephalus, or intracranial collection. Vascular: Normal flow voids and vascular enhancement Skull and upper cervical spine: Frontal skull defect, eccentric to the left, encompassing the resected tumor. Sinuses/Orbits: Unremarkable. No evidence of intra-ocular hemorrhage or proptosis. The small right frontal sinus remains and is aerated appearing. IMPRESSION: No complicating feature or residual tumor seen after left frontal bone mass resection. Electronically Signed   By: JMonte FantasiaM.D.   On: 10/11/2019 10:06    Labs:  CBC: Recent Labs    10/04/19 0947 10/10/19 0907 10/10/19 1002 10/11/19 0500 10/31/19 1248 11/08/19 0730  WBC 7.1  --   --  16.6* 9.0 7.1  HGB 12.0   < > 7.8* 9.1* 10.1* 10.4*  HCT 36.1   < > 23.0* 26.5* 30.6* 31.2*  PLT 273  --   --  155 325 254   < > = values in this interval not displayed.    COAGS: Recent Labs    11/08/19 0730  INR 1.0    BMP: Recent Labs    02/01/19 1037 02/01/19 1037 06/01/19 0910 06/01/19 0910 10/06/19 1544 10/06/19 1544 10/10/19 0907 10/10/19 1002 10/11/19 0500 10/31/19 1248  NA 139   < > 144  --  144   < > 142 143 141 139  K 4.0   < > 4.4   < > 4.0   < > 3.6 3.4* 4.2 3.8  CL 97   < > 104  --  106  --   --   --  109 105  CO2 25   < > 25  --  26  --   --   --  23 30  GLUCOSE 78   < > 92  --  78  --   --   --  104* 81  BUN 7   < > 10  --  21*  --   --   --  10 13  CALCIUM 9.7   < > 9.4  --  9.5  --   --   --  8.4* 9.7  CREATININE 0.77   < > 0.77  --  0.80  --   --   --  0.82 0.76  GFRNONAA 87   < > 87  --  >60  --   --   --  >60 >60  GFRAA 101  --  101  --  >60  --   --   --   >60  --    < > = values in this  interval not displayed.    LIVER FUNCTION TESTS: Recent Labs    02/01/19 1037 06/01/19 0910 10/31/19 1248  BILITOT 0.8 0.5 0.5  AST 15 24 16   ALT 10 24 18   ALKPHOS 60 64 65  PROT 6.6 6.0 7.1  ALBUMIN 4.5 4.2 4.1    TUMOR MARKERS: No results for input(s): AFPTM, CEA, CA199, CHROMGRNA in the last 8760 hours.  Assessment and Plan: 54 y.o. 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 isolated plasma cell neoplasm vs multiple myeloma). Risks and benefits of procedure was discussed with the patient  including, but not limited to bleeding, infection, damage to adjacent structures or low yield requiring additional tests.  All of the questions were answered and there is agreement to proceed.  Consent signed and in chart.     Thank you for this interesting consult.  I greatly enjoyed meeting Vinia Jemmott and look forward to participating in their care.  A copy of this report was sent to the requesting provider on this date.  Electronically Signed: D. Rowe Robert, PA-C 11/08/2019, 8:20 AM   I spent a total of  20 minutes   in face to face in clinical consultation, greater than 50% of which was counseling/coordinating care for CT-guided bone marrow biopsy

## 2019-11-10 ENCOUNTER — Encounter (HOSPITAL_COMMUNITY)
Admission: RE | Admit: 2019-11-10 | Discharge: 2019-11-10 | Disposition: A | Discharge status: Home / Self Care | Payer: BC Managed Care – PPO | Source: Ambulatory Visit | Attending: Hematology | Admitting: Hematology

## 2019-11-10 ENCOUNTER — Other Ambulatory Visit: Payer: Self-pay

## 2019-11-10 DIAGNOSIS — C9030 Solitary plasmacytoma not having achieved remission: Secondary | ICD-10-CM | POA: Diagnosis present

## 2019-11-10 LAB — GLUCOSE, CAPILLARY: Glucose-Capillary: 85 mg/dL (ref 70–99)

## 2019-11-10 MED ORDER — FLUDEOXYGLUCOSE F - 18 (FDG) INJECTION
8.0000 | Freq: Once | INTRAVENOUS | Status: AC | PRN
  Administered 2019-11-10: 8 via INTRAVENOUS

## 2019-11-15 ENCOUNTER — Encounter (HOSPITAL_COMMUNITY): Payer: Self-pay | Admitting: Hematology

## 2019-11-15 NOTE — Progress Notes (Signed)
HEMATOLOGY/ONCOLOGY CONSULTATION NOTE  Date of Service: 11/15/2019  Patient Care Team: [name], FNP as PCP - General (General Practice)  CHIEF COMPLAINTS/PURPOSE OF CONSULTATION:  Plasma Cell Neoplasm  HISTORY OF PRESENTING ILLNESS:   Alexandra [name] is a wonderful 54 y.o. female who has been referred to [place] by Dr. 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 The pt reports that she is doing well overall.   The pt reports that she was taking Benazepril-HCTZ for her HTN but was told to discontinue as she was becoming hypotensive while in the hospital. She continues taking Lipitor for her HLD and was previously on Ozempic for her Type 2 Diabetes. She has no significant family history of cancers or blood disorders. Pt has no known medication allergies. Pt had a hammer toe surgery and a tubal ligation. 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   In mid-June she began to notice a growth on the left side of her forehead. Because the lump continued to grow she visited her PCP at the end of June. Her PCP and Dermatologist thought that it was a lipoma initally. She was sent for a consult with Dr. Harlow Mares at [facility] in August. She received her first cranial MRI on 10/05/2019, which revealed a mass and bone erosion. Pt had her initial Craniectomy on 10/11/2019 and a Left Bifrontal Cranioplasty on 10/14/2019. Pt denies any new bone pain, fevers, chills, or night sweats at the time. Pt has lost 40 lbs over the last year, but this was after the addition of Ozempic and improved dietary and exercise habits. She has started regaining weight after holding Ozempic. At this time pt reports mild discomfort at surgical site and no other pertinent symptoms.  Of note prior to the patient's visit today, pt has had Surgical Pathology [id number]) completed on 10/10/2019 with results revealing "A.  BRAIN TUMOR, LEFT FRONTAL, RESECTION: - Plasma cell neoplasm B. BRAIN TUMOR, LEFT FRONTAL, RESECTION: - Plasma cell neoplasm C. BONE, LEFT FRONTAL, RESECTION: - Plasma cell neoplasm."   Pt has had MRI Brain ([id number]) completed on 10/11/2019 with results revealing "No complicating feature or residual tumor seen after left frontal bone mass resection."  Pt has had MRI Brain ([id number]) completed on 10/05/2019 with results revealing "1. Large extra-axial mass in the anterior left frontal region with bone erosion and extension into the scalp soft tissues. Considerations include hemangiopericytoma, metastasis, and aggressive meningioma. 2. 6 mm enhancing lesion in the clivus. This is indeterminate but does raise concern for metastatic disease. 3. Mild chronic small vessel ischemic disease."  Most recent lab results (10/11/2019) of CBC & BMP is as follows: all values are WNL except for WBC at 16.6K, RBC at 2.99, Hgb at 9.1, HCT at 26.5, Glucose at 104, Calcium at 8.4.  On review of systems, pt reports discomfort at surgicalsite and denies balance issues, memory loss, headaches, fevers, chills, unexpected weight loss and any othe symptoms.   On PMHx the pt reports HTN, HLD, Diabetes, Tubal Ligation, Hammer Toe Surgery, Herpes. On Social Hx the pt reports that she previously smoked on and off but is not currently smoking and does not drink much alcohol. On Family Hx the pt reports a grandfather with prostate cancer.  INTERVAL HISTORY:  Alexandra [name] is a wonderful 54 y.o. 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 The patient's last visit with [place] was  on 10/11/201. The pt reports that she is doing well overall.  The pt reports no acute new issues. No headache. Minimal swelling at surgical site.  Of note since the patient's last visit, pt has had PET/CT ([id number]) completed on 11/10/2019 with results revealing "1. No foci of metabolic activity within the  skeleton to localize active multiple myeloma. 2. No lytic lesions identified on the CT portion exam. 3. No abnormal activity at the LEFT frontal craniectomy site. 4. Benign metabolic activity in the RIGHT iliac bone related to recent bone marrow biopsy. 5. No evidence of soft tissue plasmacytoma."  Of note since the patient's last visit, pt has had Right Posterior Iliac ([id number]) completed on 11/08/2019 with results revealing "BONE MARROW, ASPIRATE, CLOT, CORE: - Plasma cell myeloma. PERIPHERAL BLOOD: - Normocytic anemia."   Lab results (10/31/19) of CBC w/diff and CMP is as follows: all values are WNL except for RBC at 3.40, Hgb at 10.1, HCT at 30.6, Anion gap at 4. 10/31/2019 24hr UPEP shows all values are WNL except for Total Protein at 1318, Free Lambda Light Chains Ur at 1332.46, Free Kappa/Lambda Ratio at 0.02, M Spike at 27.4, M Spike 24 hr at 361. 10/31/2019 MMP shows all values are WNL except for IgA at 51, IgM at 11 10/31/2019 K/L light chains is as follows: Kappa free light chain at 11.2, Lamda free light chains at 1545.0, K/L light chain ratio at 0.01  10/31/2919 Iron Panel shows all values are WNL 10/31/2019 Beta-2 Microglobulin at 1.7 10/31/2019 Vitamin B12 at 273 10/31/2019 Ferritin at 95 10/31/2019 LDH at 169   MEDICAL HISTORY:  Past Medical History:  Diagnosis Date  . Anxiety   . Hypertension   . Pre-diabetes     SURGICAL HISTORY: Past Surgical History:  Procedure Laterality Date  . ABLATION    . APPLICATION OF CRANIAL NAVIGATION N/A 10/10/2019   Procedure: APPLICATION OF CRANIAL NAVIGATION;  Surgeon: Consuella Lose, MD;  Location: [place];  Service: Neurosurgery;  Laterality: N/A;  APPLICATION OF CRANIAL NAVIGATION  . CRANIOPLASTY Left 10/14/2019   Procedure: LEFT CRANIOPLASTY WITH PLACEMENT OF CUSTOM CRANIAL IMPLANT;  Surgeon: Consuella Lose, MD;  Location: [place];  Service: Neurosurgery;  Laterality: Left;  . CRANIOTOMY Left 10/10/2019   Procedure: Left  frontal Stereotactic craniectomy for resection of tumor;  Surgeon: Consuella Lose, MD;  Location: [place];  Service: Neurosurgery;  Laterality: Left;  Left frontal Stereotactic craniectomy for resection of tumor  . HAMMER TOE SURGERY Left   . TUBAL LIGATION      SOCIAL HISTORY: Social History   Socioeconomic History  . Marital status: Single    Spouse name: Not on file  . Number of children: Not on file  . Years of education: Not on file  . Highest education level: Not on file  Occupational History  . Not on file  Tobacco Use  . Smoking status: Never Smoker  . Smokeless tobacco: Never Used  Vaping Use  . Vaping Use: Never used  Substance and Sexual Activity  . Alcohol use: Yes    Comment: occassionally  . Drug use: Not Currently  . Sexual activity: Not on file  Other Topics Concern  . Not on file  Social History Narrative  . Not on file   Social Determinants of Health   Financial Resource Strain:   . Difficulty of Paying Living Expenses: Not on file  Food Insecurity:   . Worried About Charity fundraiser in the Last Year: Not on file  .  Ran Out of Food in the Last Year: Not on file  Transportation Needs:   . Lack of Transportation (Medical): Not on file  . Lack of Transportation (Non-Medical): Not on file  Physical Activity:   . Days of Exercise per Week: Not on file  . Minutes of Exercise per Session: Not on file  Stress:   . Feeling of Stress : Not on file  Social Connections:   . Frequency of Communication with Friends and Family: Not on file  . Frequency of Social Gatherings with Friends and Family: Not on file  . Attends Religious Services: Not on file  . Active Member of Clubs or Organizations: Not on file  . Attends Archivist Meetings: Not on file  . Marital Status: Not on file  Intimate Partner Violence:   . Fear of Current or Ex-Partner: Not on file  . Emotionally Abused: Not on file  . Physically Abused: Not on file  . Sexually Abused:  Not on file    FAMILY HISTORY: Family History  Problem Relation Age of Onset  . Breast cancer Maternal Grandmother   . Heart disease Mother   . Hypertension Mother   . Sarcoidosis Father   . Diabetes Maternal Grandfather     ALLERGIES:  has No Known Allergies.  MEDICATIONS:  Current Outpatient Medications  Medication Sig Dispense Refill  . atorvastatin (LIPITOR) 10 MG tablet TAKE 1 TABLET BY MOUTH EVERY DAY (Patient taking differently: Take 10 mg by mouth daily. ) 90 tablet 1  . benazepril-hydrochlorthiazide (LOTENSIN HCT) 20-12.5 MG tablet TAKE 1 TABLET BY MOUTH EVERY DAY (Patient not taking: Reported on 10/31/2019) 90 tablet 1  . Eszopiclone 3 MG TABS Take 1 tablet (3 mg total) by mouth at bedtime. Take immediately before bedtime 30 tablet 5  . Insulin Pen Needle (NOVOFINE PLUS) 32G X 4 MM MISC Inject 1 pen into the skin daily. Inject 1 pen with Victoza SQ 100 each 3  . levocetirizine (XYZAL) 5 MG tablet TAKE 1 TABLET BY MOUTH EVERY DAY IN THE EVENING (Patient taking differently: Take 5 mg by mouth every evening. ) 30 tablet 2  . oxyCODONE-acetaminophen (PERCOCET) 5-325 MG tablet Take 1 tablet by mouth every 4 (four) hours as needed for severe pain. 30 tablet 0  . oxyCODONE-acetaminophen (PERCOCET) 5-325 MG tablet Take 1 tablet by mouth every 4 (four) hours as needed for severe pain. 30 tablet 0  . OZEMPIC, 0.25 OR 0.5 MG/DOSE, 2 MG/1.5ML SOPN INJECT 0.5 MG TOTAL INTO THE SKIN ONCE A WEEK. 4.5 mL 1  . valACYclovir (VALTREX) 1000 MG tablet Take 1 tablet (1,000 mg total) by mouth daily. Patient taking it as needed 90 tablet 1   No current facility-administered medications for this visit.    REVIEW OF SYSTEMS:   A 10+ POINT REVIEW OF SYSTEMS WAS OBTAINED including neurology, dermatology, psychiatry, cardiac, respiratory, lymph, extremities, GI, GU, Musculoskeletal, constitutional, breasts, reproductive, HEENT.  All pertinent positives are noted in the HPI.  All others are negative.    PHYSICAL EXAMINATION: ECOG PERFORMANCE STATUS: 1 - Symptomatic but completely ambulatory  . Vitals:   11/18/19 0930  BP: 130/78  Pulse: 77  Resp: 18  Temp: 97.8 F (36.6 C)  SpO2: 100%   Filed Weights   11/18/19 0930  Weight: 162 lb 6.4 oz (73.7 kg)   .Body mass index is 25.44 kg/m.  NAD GENERAL:alert. SKIN: no acute rashes, no significant lesions EYES: conjunctiva are pink and non-injected, sclera anicteric OROPHARYNX: MMM, no exudates,  no oropharyngeal erythema or ulceration NECK: supple, no JVD LYMPH:  no palpable lymphadenopathy in the cervical, axillary or inguinal regions LUNGS: clear to auscultation b/l with normal respiratory effort HEART: regular rate & rhythm ABDOMEN:  normoactive bowel sounds , non tender, not distended. No palpable hepatosplenomegaly.  Extremity: no pedal edema PSYCH: alert & oriented x 3 with fluent speech NEURO: no focal motor/sensory deficits  LABORATORY DATA:  I have reviewed the data as listed  . CBC Latest Ref Rng & Units 11/08/2019 10/31/2019 10/11/2019  WBC 4.0 - 10.5 K/uL 7.1 9.0 16.6(H)  Hemoglobin 12.0 - 15.0 g/dL 10.4(L) 10.1(L) 9.1(L)  Hematocrit 36 - 46 % 31.2(L) 30.6(L) 26.5(L)  Platelets 150 - 400 K/uL 254 325 155    . CMP Latest Ref Rng & Units 10/31/2019 10/11/2019 10/10/2019  Glucose 70 - 99 mg/dL 81 104(H) -  BUN 6 - 20 mg/dL 13 10 -  Creatinine 0.44 - 1.00 mg/dL 0.76 0.82 -  Sodium 135 - 145 mmol/L 139 141 143  Potassium 3.5 - 5.1 mmol/L 3.8 4.2 3.4(L)  Chloride 98 - 111 mmol/L 105 109 -  CO2 22 - 32 mmol/L 30 23 -  Calcium 8.9 - 10.3 mg/dL 9.7 8.4(L) -  Total Protein 6.5 - 8.1 g/dL 7.1 - -  Total Bilirubin 0.3 - 1.2 mg/dL 0.5 - -  Alkaline Phos 38 - 126 U/L 65 - -  AST 15 - 41 U/L 16 - -  ALT 0 - 44 U/L 18 - -   11/08/2019 Bone Marrow Report [id number]):   10/10/2019 Surgical Pathology [phone]):   RADIOGRAPHIC STUDIES:  I have personally reviewed the radiological images as listed and  agreed with the findings in the report. NM PET Image Initial (PI) Whole Body  Result Date: 11/10/2019 CLINICAL DATA:  Subsequent treatment strategy for plasmacytoma versus multiple myeloma. EXAM: NUCLEAR MEDICINE PET WHOLE BODY TECHNIQUE: 8.0 mCi F-18 FDG was injected intravenously. Full-ring PET imaging was performed from the head to foot after the radiotracer. CT data was obtained and used for attenuation correction and anatomic localization. Fasting blood glucose: 85 mg/dl COMPARISON:  None. FINDINGS: Mediastinal blood pool activity: SUV max 2.1 HEAD/NECK: No abnormal metabolic activity at the craniectomy site along the LEFT frontal bone. Incidental CT findings: none CHEST: No hypermetabolic mediastinal or hilar nodes. No suspicious pulmonary nodules on the CT scan. Incidental CT findings: none ABDOMEN/PELVIS: No abnormal hypermetabolic activity within the liver, pancreas, adrenal glands, or spleen. No hypermetabolic lymph nodes in the abdomen or pelvis. Incidental CT findings: none SKELETON: No focal hypermetabolic activity within the marrow to suggest active multiple myeloma. Focus of activity in the posterior RIGHT iliac bone presumably related to bone marrow biopsy performed on 11/08/2019. Incidental CT findings: No lytic lesions or expansile lesions to localize multiple myeloma. EXTREMITIES: No abnormal hypermetabolic activity in the lower extremities. Incidental CT findings: No lytic lesion IMPRESSION: 1. No foci of metabolic activity within the skeleton to localize active multiple myeloma. 2. No lytic lesions identified on the CT portion exam. 3. No abnormal activity at the LEFT frontal craniectomy site. 4. Benign metabolic activity in the RIGHT iliac bone related to recent bone marrow biopsy. 5. No evidence of soft tissue plasmacytoma Electronically Signed   By: Suzy Bouchard M.D.   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 ANESTHESIA/SEDATION: Moderate (conscious) sedation was employed  during this procedure. A total of Versed 4.0 mg and Fentanyl 100 mcg was administered intravenously. Moderate Sedation Time: 10 minutes. The patient's level of consciousness and vital signs were monitored continuously by radiology nursing throughout the procedure under my direct supervision. FLUOROSCOPY TIME:  CT COMPLICATIONS: None PROCEDURE: The procedure risks, benefits, and alternatives were explained to the patient. Questions regarding the procedure were encouraged and answered. The patient understands and consents to the procedure. Scout CT of the pelvis was performed for surgical planning purposes. The posterior pelvis was prepped with Chlorhexidine in a sterile fashion, and a sterile drape was applied covering the operative field. A sterile gown and sterile gloves were used for the procedure. Local anesthesia was provided with 1% Lidocaine. Right posterior iliac bone was targeted for biopsy. The skin and subcutaneous tissues were infiltrated with 1% lidocaine without epinephrine. A small stab incision was made with an 11 blade scalpel, and an 11 gauge Murphy needle was advanced with CT guidance to the posterior cortex. Manual forced was used to advance the needle through the posterior cortex and the stylet was removed. A bone marrow aspirate was retrieved and passed to a cytotechnologist in the room. The Murphy needle was then advanced without the stylet for a core biopsy. The core biopsy was retrieved and also passed to a cytotechnologist. Manual pressure was used for hemostasis and a sterile dressing was placed. No complications were encountered no significant blood loss was encountered. Patient tolerated the procedure well and remained hemodynamically stable throughout. IMPRESSION: Status post CT-guided bone marrow biopsy, with tissue  specimen sent to pathology for complete histopathologic analysis Signed, Dulcy Fanny. Earleen Newport, DO [facility] Radiology Electronically Signed   By: Corrie Mckusick D.O.   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 ANESTHESIA/SEDATION: Moderate (conscious) sedation was employed during this procedure. A total of Versed 4.0 mg and Fentanyl 100 mcg was administered intravenously. Moderate Sedation Time: 10 minutes. The patient's level of consciousness and vital signs were monitored continuously by radiology nursing throughout the procedure under my direct supervision. FLUOROSCOPY TIME:  CT COMPLICATIONS: None PROCEDURE: The procedure risks, benefits, and alternatives were explained to the patient. Questions regarding the procedure were encouraged and answered. The patient understands and consents to the procedure. Scout CT of the pelvis was performed for surgical planning purposes. The posterior pelvis was prepped with Chlorhexidine in a sterile fashion, and a sterile drape was applied covering the operative field. A sterile gown and sterile gloves were used for the procedure. Local anesthesia was provided with 1% Lidocaine. Right posterior iliac bone was targeted for biopsy. The skin and subcutaneous tissues were infiltrated with 1% lidocaine without epinephrine. A small stab incision was made with an 11 blade scalpel, and an 11 gauge Murphy needle was advanced with CT guidance to the posterior cortex. Manual forced was used to advance the needle through the posterior cortex and the stylet was removed. A bone marrow aspirate was retrieved and passed to a cytotechnologist in the room. The Murphy needle was then advanced without the stylet for a core biopsy. The core biopsy was  retrieved and also passed to a cytotechnologist. Manual pressure was used for hemostasis and a sterile dressing was placed. No complications were encountered no significant blood loss was encountered. Patient tolerated the procedure well and remained hemodynamically  stable throughout. IMPRESSION: Status post CT-guided bone marrow biopsy, with tissue specimen sent to pathology for complete histopathologic analysis Signed, Dulcy Fanny. Earleen Newport, DO [facility] Radiology Electronically Signed   By: Corrie Mckusick D.O.   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 No M spike. Lambda light chain myeloma + Anemia + bone lesion on skull-- resected No renal failure, hypercalcemia  BMBx- 22-30% lambda restricted plasma cell MolCy Dup (1q), del (13q) PLAN: -labs discussed with patient - mild anemia and elevated lambda light chains -PET/CT discussed- no residual bone lesions -BM Bx with 22-30% lambda restricted plasma cells.consistent with myeloma -FISH - with dup(1p) --associated with unfavorable risk myeloma -will plan to rx with KRd for induction treatment.    FOLLOW UP: Chemo-counseling for KRd Plan to start [place] with labs in 7 days with labs MD visit with C1D8 for toxicity check.   The total time spent in the appt was 40 minutes and more than 50% was on counseling and direct patient cares.  All of the patient's questions were answered with apparent satisfaction. The patient knows to call the clinic with any problems, questions or concerns.    Sullivan Lone MD [place] AAHIVMS [facility] Hematology/Oncology Physician [facility]  (Office):       [phone] (Work cell):  [phone] (Fax):           [phone]  11/15/2019 12:50 PM  I, Yevette Edwards, am acting as a  scribe for Dr. Sullivan Lone.   .I have reviewed the above documentation for accuracy and completeness, and I agree with the above. Brunetta Genera MD

## 2019-11-18 ENCOUNTER — Other Ambulatory Visit: Payer: Self-pay

## 2019-11-18 ENCOUNTER — Inpatient Hospital Stay: Payer: BC Managed Care – PPO | Admitting: Hematology

## 2019-11-18 VITALS — BP 130/78 | HR 77 | Temp 97.8°F | Resp 18 | Ht 67.0 in | Wt 162.4 lb

## 2019-11-18 DIAGNOSIS — C9000 Multiple myeloma not having achieved remission: Secondary | ICD-10-CM | POA: Diagnosis not present

## 2019-11-18 DIAGNOSIS — C9030 Solitary plasmacytoma not having achieved remission: Secondary | ICD-10-CM | POA: Diagnosis not present

## 2019-11-18 NOTE — Patient Instructions (Signed)
Thank you for choosing [facility] to provide your oncology and hematology care.   Should you have questions after your visit to the [facility] ([facility]), please contact this office at [phone] between 8:30 AM and 4:30 PM.  Voice mails left after 4:00 PM may not be returned until the following business day.  Calls received after 4:30 PM will be answered by an off-site Nurse Triage Line.    Prescription Refills:  Please have your pharmacy contact us directly for most prescription requests.  Contact the office directly for refills of narcotics (pain medications). Allow 48-72 hours for refills.  Appointments: Please contact the [facility] scheduling department [phone] for questions regarding [facility] appointment scheduling.  Contact the schedulers with any scheduling changes so that your appointment can be rescheduled in a timely manner.   Central Scheduling for [place] [phone] - Call to schedule procedures such as PET scans, CT scans, MRI, Ultrasound, etc.  To afford each patient quality time with our providers, please arrive 30 minutes before your scheduled appointment time.  If you arrive late for your appointment, you may be asked to reschedule.  We strive to give you quality time with our providers, and arriving late affects you and other patients whose appointments are after yours. If you are a no show for multiple scheduled visits, you may be dismissed from the clinic at the providers discretion.     Resources: [facility] Social Workers [phone] for additional information on assistance programs or assistance connecting with community support programs   [place] DSS  [phone]: Information regarding food stamps, Medicaid, and utility assistance [place] Access [place] [phone]   [place] [employer]'s shared-ride transportation service for eligible riders who have a disability that prevents them from riding the fixed route bus.   Medicare  Rights Center 800-333-4114 Helps people with Medicare understand their rights and benefits, navigate the Medicare system, and secure the quality healthcare they deserve [employer] 800-227-2345 Assists patients locate various types of support and financial assistance Cancer Care: 1-800-813-HOPE (4673) Provides financial assistance, online support groups, medication/co-pay assistance.   Transportation Assistance for appointments at [facility]: Transportation Coordinator [phone]  Again, thank you for choosing [facility] for your care.       

## 2019-11-22 ENCOUNTER — Encounter (HOSPITAL_COMMUNITY): Payer: Self-pay | Admitting: Hematology

## 2019-11-22 LAB — SURGICAL PATHOLOGY

## 2019-11-23 ENCOUNTER — Telehealth: Payer: Self-pay | Admitting: *Deleted

## 2019-11-23 NOTE — Telephone Encounter (Signed)
Patient called - she wants to know if ok to have Moderna booster (original vax was J&J) she said Dr. Irene Limbo was checking for her.  She also said her FISH panel was back and wants to know when to start treatment. Dr. Irene Limbo informed.  Contacted patient with MD response: She can go ahead and get Moderna booster dose 39months after her original JNJ vaccine. Dr. Irene Limbo will order and schedule her to setup treatment starting sometime next week.  Patient verbalized understanding and knows to expect a call from [place] scheduling.

## 2019-11-28 ENCOUNTER — Telehealth: Payer: Self-pay | Admitting: Pharmacist

## 2019-11-28 ENCOUNTER — Other Ambulatory Visit: Payer: Self-pay | Admitting: Hematology

## 2019-11-28 ENCOUNTER — Telehealth: Payer: Self-pay

## 2019-11-28 DIAGNOSIS — C7951 Secondary malignant neoplasm of bone: Secondary | ICD-10-CM

## 2019-11-28 DIAGNOSIS — C9030 Solitary plasmacytoma not having achieved remission: Secondary | ICD-10-CM

## 2019-11-28 DIAGNOSIS — C9000 Multiple myeloma not having achieved remission: Secondary | ICD-10-CM

## 2019-11-28 DIAGNOSIS — Z7189 Other specified counseling: Secondary | ICD-10-CM

## 2019-11-28 MED ORDER — DEXAMETHASONE 4 MG PO TABS: ORAL_TABLET | ORAL | 4 refills | Status: DC

## 2019-11-28 MED ORDER — LENALIDOMIDE 15 MG PO CAPS: 15.0000 mg | ORAL_CAPSULE | Freq: Every day | ORAL | 0 refills | Status: DC

## 2019-11-28 MED ORDER — ASPIRIN EC 81 MG PO TBEC: 81.0000 mg | DELAYED_RELEASE_TABLET | Freq: Every day | ORAL | 3 refills | Status: DC

## 2019-11-28 MED ORDER — ONDANSETRON HCL 8 MG PO TABS: 8.0000 mg | ORAL_TABLET | Freq: Two times a day (BID) | ORAL | 1 refills | Status: DC | PRN

## 2019-11-28 MED ORDER — ACYCLOVIR 400 MG PO TABS: 400.0000 mg | ORAL_TABLET | Freq: Every day | ORAL | 3 refills | Status: DC

## 2019-11-28 MED ORDER — LIDOCAINE-PRILOCAINE 2.5-2.5 % EX CREA: TOPICAL_CREAM | CUTANEOUS | 3 refills | Status: DC

## 2019-11-28 MED ORDER — PROCHLORPERAZINE MALEATE 10 MG PO TABS: 10.0000 mg | ORAL_TABLET | Freq: Four times a day (QID) | ORAL | 1 refills | Status: DC | PRN

## 2019-11-28 MED ORDER — LORAZEPAM 0.5 MG PO TABS: 0.5000 mg | ORAL_TABLET | Freq: Four times a day (QID) | ORAL | 0 refills | Status: DC | PRN

## 2019-11-28 MED FILL — DEXAMETHASONE 4 MG TABLET: 4 | 84 days supply | Qty: 30 | Fill #0

## 2019-11-28 MED FILL — LORazepam 0.5 MG TABS: 0.5 | 8 days supply | Qty: 30 | Fill #0

## 2019-11-28 MED FILL — PROCHLORPERAZINE 10 MG TAB: 10 | 8 days supply | Qty: 30 | Fill #0

## 2019-11-28 MED FILL — LIDOCAINE-PRILOCAINE CREAM: 2.5-2.5 | 30 days supply | Qty: 30 | Fill #0

## 2019-11-28 MED FILL — ONDANSETRON HCL 8 MG TABLET: 8 | 21 days supply | Qty: 18 | Fill #0

## 2019-11-28 NOTE — Telephone Encounter (Signed)
Filled out new patient information for Revlimd. Patient gave permission to fill out forms online. Submitted, Authorization number given to pharmacy.

## 2019-11-28 NOTE — Progress Notes (Signed)
-  KRD and Zometa orders placed. -High-priority scheduling message sent to set up for chemo counseling in 2 to 3 days and to start treatment next week. -Called and informed patient about molecular results dup(1q) -Consented obtained for Port-A-Cath placement -Patient has a mild nasal congestion after her Covid booster which is resolving.  Sullivan Lone MD MS Hematology/Oncology Physician [facility]

## 2019-11-28 NOTE — Telephone Encounter (Signed)
Oral Oncology Patient Advocate Encounter  Received notification from [employer] that prior authorization for Revllimid is required.  PA submitted on CoverMyMeds Key [id number] Status is pending  Oral Oncology Clinic will continue to follow.   [place] Patient [place] Phone [phone] Fax [phone] 11/28/2019 10:51 AM

## 2019-11-28 NOTE — Telephone Encounter (Addendum)
Oral Oncology Pharmacist Encounter  Received new prescription for Revlimid (lenalidomide) for the treatment of newly diagnosed multiple myeloma in conjunction with carfilzomib and dexamethasone, planned for duration of induction treatment.  Prescription dose and frequency assessed for appropriateness. Appropriate for therapy initiation.   CBC w/ Diff from 11/08/19 and CMP from 10/31/19 assessed, labs OK for treatment initiation.  Current medication list in Epic reviewed, no significant/relevant DDIs with Revlimid identified.  Evaluated chart and no patient barriers to medication adherence noted.   Once [employer] auth # is obtained, prescription will need to be e-scribed to [facility] given that Revlimid is a limited distribution medication.   Oral Oncology Clinic will continue to follow for insurance authorization, copayment issues, initial counseling and start date.  Leron Croak, PharmD, BCPS Hematology/Oncology Clinical Pharmacist [facility] [phone] 11/28/2019 10:42 AM

## 2019-11-28 NOTE — Progress Notes (Signed)
START ON PATHWAY REGIMEN - Multiple Myeloma and Other Plasma Cell Dyscrasias     A cycle is every 28 days:     Carfilzomib      Carfilzomib      Carfilzomib      Dexamethasone      Dexamethasone      Lenalidomide   **Always confirm dose/schedule in your pharmacy ordering system**  Patient Characteristics: Multiple Myeloma, Newly Diagnosed, Transplant Eligible, High Risk Disease Classification: Multiple Myeloma R-ISS Staging: I Therapeutic Status: Newly Diagnosed Is Patient Eligible for Transplant<= Transplant Eligible Risk Status: High Risk Intent of Therapy: Non-Curative / Palliative Intent, Discussed with Patient

## 2019-11-29 ENCOUNTER — Telehealth: Payer: Self-pay

## 2019-11-29 NOTE — Telephone Encounter (Signed)
Patient called and questioned if she could start back working. Per Dr Irene Limbo patient could start to work if she feels up to it but might want to wait until after she has her first treatment. Patient states that she is currently on short term disability. Advised patient to check into FMLA per Dr Irene Limbo. Patient states that she will check into FMLA and she is going to wait until after she starts getting treatment to decide if she is going back to work.

## 2019-11-29 NOTE — Progress Notes (Signed)
Pharmacist Chemotherapy Monitoring - Initial Assessment    Anticipated start date: 12/05/19  Regimen:  . Are orders appropriate based on the patient's diagnosis, regimen, and cycle? Yes . Does the plan date match the patient's scheduled date? Yes . Is the sequencing of drugs appropriate? Yes . Are the premedications appropriate for the patient's regimen? Yes . Prior Authorization for treatment is: Pending o If applicable, is the correct biosimilar selected based on the patient's insurance? not applicable  Organ Function and Labs: Marland Kitchen Are dose adjustments needed based on the patient's renal function, hepatic function, or hematologic function? Yes . Are appropriate labs ordered prior to the start of patient's treatment? Yes . Other organ system assessment, if indicated: N/A . The following baseline labs, if indicated, have been ordered: N/A  Dose Assessment: . Are the drug doses appropriate? Yes . Are the following correct: o Drug concentrations Yes o IV fluid compatible with drug Yes o Administration routes Yes o Timing of therapy Yes . If applicable, does the patient have documented access for treatment and/or plans for port-a-cath placement? yes . If applicable, have lifetime cumulative doses been properly documented and assessed? not applicable Lifetime Dose Tracking  No doses have been documented on this patient for the following tracked chemicals: Doxorubicin, Epirubicin, Idarubicin, Daunorubicin, Mitoxantrone, Bleomycin, Oxaliplatin, Carboplatin, Liposomal Doxorubicin  o   Toxicity Monitoring/Prevention: . The patient has the following take home antiemetics prescribed: Ondansetron . The patient has the following take home medications prescribed: VZV prophylaxis . Medication allergies and previous infusion related reactions, if applicable, have been reviewed and addressed. Yes . The patient's current medication list has been assessed for drug-drug interactions with their  chemotherapy regimen. no significant drug-drug interactions were identified on review.  Order Review: . Are the treatment plan orders signed? Yes . Is the patient scheduled to see a provider prior to their treatment? No  I verify that I have reviewed each item in the above checklist and answered each question accordingly.  Philomena Course 11/29/2019 12:18 PM

## 2019-11-30 ENCOUNTER — Other Ambulatory Visit: Payer: Self-pay | Admitting: Student

## 2019-11-30 MED ORDER — LENALIDOMIDE 15 MG PO CAPS: 15.0000 mg | ORAL_CAPSULE | Freq: Every day | ORAL | 0 refills | Status: DC

## 2019-11-30 NOTE — Telephone Encounter (Signed)
Oral Oncology Pharmacist Encounter  Notified that both [facility] and [facility] are unable to fill patient's Revlimid. [facility] is not contracted with patient's insurance plan. Spoke with representative at [facility] (preferred pharmacy for [employer]) and stated that Rx will need to be filled through [facility].   Rx redirected to [facility].   Leron Croak, PharmD, BCPS Hematology/Oncology Clinical Pharmacist [facility] [phone] 11/30/2019 4:12 PM

## 2019-12-01 ENCOUNTER — Telehealth: Payer: Self-pay | Admitting: Hematology

## 2019-12-01 ENCOUNTER — Ambulatory Visit (HOSPITAL_COMMUNITY)
Admission: RE | Admit: 2019-12-01 | Discharge: 2019-12-01 | Disposition: A | Discharge status: Home / Self Care | Payer: 59 | Source: Ambulatory Visit | Attending: Hematology | Admitting: Hematology

## 2019-12-01 ENCOUNTER — Other Ambulatory Visit: Payer: Self-pay | Admitting: Hematology

## 2019-12-01 ENCOUNTER — Other Ambulatory Visit: Payer: Self-pay

## 2019-12-01 DIAGNOSIS — C9000 Multiple myeloma not having achieved remission: Secondary | ICD-10-CM | POA: Insufficient documentation

## 2019-12-01 DIAGNOSIS — C7951 Secondary malignant neoplasm of bone: Secondary | ICD-10-CM

## 2019-12-01 DIAGNOSIS — C9030 Solitary plasmacytoma not having achieved remission: Secondary | ICD-10-CM

## 2019-12-01 HISTORY — PX: IR US GUIDE VASC ACCESS RIGHT: IMG2390

## 2019-12-01 HISTORY — PX: IR IMAGING GUIDED PORT INSERTION: IMG5740

## 2019-12-01 MED ORDER — CEFAZOLIN SODIUM-DEXTROSE 2-4 GM/100ML-% IV SOLN
2.0000 g | Freq: Once | INTRAVENOUS | Status: AC
  Administered 2019-12-01: 2 g via INTRAVENOUS

## 2019-12-01 MED ORDER — FENTANYL CITRATE (PF) 100 MCG/2ML IJ SOLN
INTRAMUSCULAR | Status: AC | PRN
Start: 2019-12-01 — End: 2019-12-01
  Administered 2019-12-01: 50 ug via INTRAVENOUS

## 2019-12-01 MED ORDER — MIDAZOLAM HCL 2 MG/2ML IJ SOLN
INTRAMUSCULAR | Status: AC
  Filled 2019-12-01: qty 2

## 2019-12-01 MED ORDER — LIDOCAINE-EPINEPHRINE 1 %-1:100000 IJ SOLN
INTRAMUSCULAR | Status: AC
  Filled 2019-12-01: qty 1

## 2019-12-01 MED ORDER — FENTANYL CITRATE (PF) 100 MCG/2ML IJ SOLN
INTRAMUSCULAR | Status: AC
  Filled 2019-12-01: qty 2

## 2019-12-01 MED ORDER — HEPARIN SOD (PORK) LOCK FLUSH 100 UNIT/ML IV SOLN
INTRAVENOUS | Status: AC
  Filled 2019-12-01: qty 5

## 2019-12-01 MED ORDER — MIDAZOLAM HCL 2 MG/2ML IJ SOLN
INTRAMUSCULAR | Status: AC | PRN
  Administered 2019-12-01: 1 mg via INTRAVENOUS

## 2019-12-01 MED ORDER — LIDOCAINE-EPINEPHRINE 1 %-1:100000 IJ SOLN
INTRAMUSCULAR | Status: AC | PRN
  Administered 2019-12-01: 10 mL

## 2019-12-01 MED ORDER — CEFAZOLIN SODIUM-DEXTROSE 2-4 GM/100ML-% IV SOLN
INTRAVENOUS | Status: AC
  Filled 2019-12-01: qty 100

## 2019-12-01 MED ORDER — SODIUM CHLORIDE 0.9 % IV SOLN: INTRAVENOUS | Status: DC

## 2019-12-01 NOTE — Telephone Encounter (Signed)
Called pt per 11/8 sch msg - unable to reach pt. Left message for patient with appt date and time

## 2019-12-01 NOTE — Procedures (Signed)
Interventional Radiology Procedure Note ° °Procedure: Single Lumen Power Port Placement   ° °Access:  Right internal jugular vein ° °Findings: Catheter tip positioned at cavoatrial junction. Port is ready for immediate use.  ° °Complications: None ° °EBL: < 10 mL ° °Recommendations:  °- Ok to shower in 24 hours °- Do not submerge for 7 days °- Routine line care  ° ° °Kahlea Cobert, MD °Pager: [phone] ° ° ° °

## 2019-12-01 NOTE — Progress Notes (Signed)
Patient was given discharge instructions. She verbalized understanding. 

## 2019-12-01 NOTE — Discharge Instructions (Signed)

## 2019-12-01 NOTE — H&P (Signed)
Chief Complaint: Patient was seen in consultation today for [facility] a Cath placement at the request of Alexandra [name]  Referring Physician(s): Alexandra [name]  Supervising Physician: Dr Ruthann Cancer  Patient Status: [facility] - [facility] - Out-pt  History of Present Illness: Alexandra [name] is a 54 y.o. female   Recent dx Multiple Myeloma To start chemo Mon Nov 15  Follows with Dr Irene Limbo PATHWAY REGIMEN - Multiple Myeloma and Other Plasma Cell Dyscrasias  Past Medical History:  Diagnosis Date  . Anxiety   . Hypertension   . Pre-diabetes     Past Surgical History:  Procedure Laterality Date  . ABLATION    . APPLICATION OF CRANIAL NAVIGATION N/A 10/10/2019   Procedure: APPLICATION OF CRANIAL NAVIGATION;  Surgeon: Consuella Lose, MD;  Location: [place];  Service: Neurosurgery;  Laterality: N/A;  APPLICATION OF CRANIAL NAVIGATION  . CRANIOPLASTY Left 10/14/2019   Procedure: LEFT CRANIOPLASTY WITH PLACEMENT OF CUSTOM CRANIAL IMPLANT;  Surgeon: Consuella Lose, MD;  Location: [place];  Service: Neurosurgery;  Laterality: Left;  . CRANIOTOMY Left 10/10/2019   Procedure: Left frontal Stereotactic craniectomy for resection of tumor;  Surgeon: Consuella Lose, MD;  Location: [place];  Service: Neurosurgery;  Laterality: Left;  Left frontal Stereotactic craniectomy for resection of tumor  . HAMMER TOE SURGERY Left   . TUBAL LIGATION      Allergies: Patient has no known allergies.  Medications: Prior to Admission medications   Medication Sig Start Date End Date Taking? Authorizing Provider  acetaminophen (TYLENOL) 650 MG CR tablet Take 1,300 mg by mouth every 8 (eight) hours as needed for pain.   Yes [provider]  atorvastatin (LIPITOR) 10 MG tablet TAKE 1 TABLET BY MOUTH EVERY DAY Patient taking differently: Take 10 mg by mouth daily.  06/14/19  Yes [name], FNP  Cholecalciferol (VITAMIN D) 50 MCG (2000 UT) tablet Take 2,000 Units by mouth daily.   Yes [provider]  Coenzyme Q10 (COQ-10) 100 MG CAPS Take 200 mg by mouth daily.   Yes [provider]  Insulin Pen Needle (NOVOFINE PLUS) 32G X 4 MM MISC Inject 1 pen into the skin daily. Inject 1 pen with Victoza SQ 10/23/17  Yes [name], FNP  Korean Panax Ginseng 100 MG CAPS Take 100 mg by mouth daily.   Yes [provider]  levocetirizine (XYZAL) 5 MG tablet TAKE 1 TABLET BY MOUTH EVERY DAY IN THE EVENING Patient taking differently: Take 5 mg by mouth every evening.  07/20/19  Yes [name], FNP  Multiple Vitamin (MULTIVITAMIN WITH MINERALS) TABS tablet Take 1 tablet by mouth daily.   Yes [provider]  Multiple Vitamins-Minerals (IMMUNE SUPPORT PO) Take 2 tablets by mouth daily.   Yes [provider]  Oxymetazoline HCl (AFRIN NODRIP SEVERE CONGEST NA) Place 1 spray into the nose daily as needed (congestion).   Yes [provider]  OZEMPIC, 0.25 OR 0.5 MG/DOSE, 2 MG/1.5ML SOPN INJECT 0.5 MG TOTAL INTO THE SKIN ONCE A WEEK. Patient taking differently: Inject 0.5 mg as directed every Friday.  10/04/19  Yes [name], FNP  senna (SENOKOT) 8.6 MG TABS tablet Take 17.2 mg by mouth daily as needed for mild constipation.   Yes [provider]  Turmeric Curcumin 500 MG CAPS Take 500 mg by mouth daily.   Yes [provider]  valACYclovir (VALTREX) 1000 MG tablet Take 1 tablet (1,000 mg total) by mouth daily. Patient taking it as needed Patient taking differently: Take 1,000 mg  by mouth daily.  10/04/19  Yes [name], FNP  vitamin B-12 (CYANOCOBALAMIN) 1000 MCG tablet Take 1,000 mcg by mouth daily.   Yes [provider]  aspirin EC 81 MG tablet Take 1 tablet (81 mg total) by mouth daily. 11/28/19   Alexandra Genera, MD  benazepril-hydrochlorthiazide (LOTENSIN HCT) 20-12.5 MG tablet TAKE 1 TABLET BY MOUTH EVERY DAY Patient not taking: Reported on 11/30/2019 01/24/19   [name], FNP  dexamethasone (DECADRON) 4 MG  tablet Take 10 tablets (7m) once on day 22. Repeat every 28 days for the first 4 cycles. Take with breakfast. 11/28/19   KBrunetta Genera MD  Eszopiclone 3 MG TABS Take 1 tablet (3 mg total) by mouth at bedtime. Take immediately before bedtime Patient taking differently: Take 3 mg by mouth at bedtime as needed (sleep). Take immediately before bedtime 10/04/19 10/03/20  MMinette Brine FNP  lenalidomide (REVLIMID) 15 MG capsule Take 1 capsule (15 mg total) by mouth daily. Take for 21 days. Hold 7 days. Repeat every 28 days. Celegene AJosem Kaufmann##[id number] Date Obtained: 11/28/2019 11/30/19   KBrunetta Genera MD  lidocaine-prilocaine (EMLA) cream Apply to affected area once Patient taking differently: Apply 1 application topically daily as needed (port access).  11/28/19   KBrunetta Genera MD  LORazepam (ATIVAN) 0.5 MG tablet Take 1 tablet (0.5 mg total) by mouth every 6 (six) hours as needed (Nausea or vomiting). 11/28/19   KBrunetta Genera MD  ondansetron (ZOFRAN) 8 MG tablet Take 1 tablet (8 mg total) by mouth 2 (two) times daily as needed (Nausea or vomiting). 11/28/19   KBrunetta Genera MD  oxyCODONE-acetaminophen (PERCOCET) 5-325 MG tablet Take 1 tablet by mouth every 4 (four) hours as needed for severe pain. Patient not taking: Reported on 11/30/2019 10/15/19 10/14/20  TVallarie Mare MD  prochlorperazine (COMPAZINE) 10 MG tablet Take 1 tablet (10 mg total) by mouth every 6 (six) hours as needed (Nausea or vomiting). 11/28/19   KBrunetta Genera MD     Family History  Problem Relation Age of Onset  . Breast cancer Maternal Grandmother   . Heart disease Mother   . Hypertension Mother   . Sarcoidosis Father   . Diabetes Maternal Grandfather     Social History   Socioeconomic History  . Marital status: Single    Spouse name: Not on file  . Number of children: Not on file  . Years of education: Not on file  . Highest education level: Not on file  Occupational History    . Not on file  Tobacco Use  . Smoking status: Never Smoker  . Smokeless tobacco: Never Used  Vaping Use  . Vaping Use: Never used  Substance and Sexual Activity  . Alcohol use: Yes    Comment: occassionally  . Drug use: Not Currently  . Sexual activity: Not on file  Other Topics Concern  . Not on file  Social History Narrative  . Not on file   Social Determinants of Health   Financial Resource Strain:   . Difficulty of Paying Living Expenses: Not on file  Food Insecurity:   . Worried About RCharity fundraiserin the Last Year: Not on file  . Ran Out of Food in the Last Year: Not on file  Transportation Needs:   . Lack of Transportation (Medical): Not on file  . Lack of Transportation (Non-Medical): Not on file  Physical Activity:   . Days of Exercise per Week: Not on  file  . Minutes of Exercise per Session: Not on file  Stress:   . Feeling of Stress : Not on file  Social Connections:   . Frequency of Communication with Friends and Family: Not on file  . Frequency of Social Gatherings with Friends and Family: Not on file  . Attends Religious Services: Not on file  . Active Member of Clubs or Organizations: Not on file  . Attends Archivist Meetings: Not on file  . Marital Status: Not on file     Review of Systems: A 12 point ROS discussed and pertinent positives are indicated in the HPI above.  All other systems are negative.  Review of Systems  Constitutional: Positive for unexpected weight change. Negative for activity change, fatigue and fever.  Respiratory: Negative for cough and shortness of breath.   Cardiovascular: Negative for chest pain.  Gastrointestinal: Negative for abdominal pain.  Musculoskeletal: Positive for back pain.  Neurological: Negative for weakness.  Psychiatric/Behavioral: Negative for behavioral problems and confusion.    Vital Signs: BP 129/82   Pulse 65   Temp 98.1 F (36.7 C) (Oral)   Resp 18   Ht _0  (1.702 m)   Wt  160 lb (72.6 kg)   LMP 07/09/2015   SpO2 100%   BMI 25.06 kg/m   Physical Exam Vitals reviewed.  HENT:     Mouth/Throat:     Mouth: Mucous membranes are moist.  Cardiovascular:     Rate and Rhythm: Normal rate and regular rhythm.     Heart sounds: Normal heart sounds.  Pulmonary:     Effort: Pulmonary effort is normal.     Breath sounds: Normal breath sounds.  Abdominal:     Palpations: Abdomen is soft.  Musculoskeletal:        General: Normal range of motion.  Skin:    General: Skin is warm.  Neurological:     Mental Status: She is alert and oriented to person, place, and time.  Psychiatric:        Behavior: Behavior normal.     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 EXAM: NUCLEAR MEDICINE PET WHOLE BODY TECHNIQUE: 8.0 mCi F-18 FDG was injected intravenously. Full-ring PET imaging was performed from the head to foot after the radiotracer. CT data was obtained and used for attenuation correction and anatomic localization. Fasting blood glucose: 85 mg/dl COMPARISON:  None. FINDINGS: Mediastinal blood pool activity: SUV max 2.1 HEAD/NECK: No abnormal metabolic activity at the craniectomy site along the LEFT frontal bone. Incidental CT findings: none CHEST: No hypermetabolic mediastinal or hilar nodes. No suspicious pulmonary nodules on the CT scan. Incidental CT findings: none ABDOMEN/PELVIS: No abnormal hypermetabolic activity within the liver, pancreas, adrenal glands, or spleen. No hypermetabolic lymph nodes in the abdomen or pelvis. Incidental CT findings: none SKELETON: No focal hypermetabolic activity within the marrow to suggest active multiple myeloma. Focus of activity in the posterior RIGHT iliac bone presumably related to bone marrow biopsy performed on 11/08/2019. Incidental CT findings: No lytic lesions or expansile lesions to localize multiple myeloma.  EXTREMITIES: No abnormal hypermetabolic activity in the lower extremities. Incidental CT findings: No lytic lesion IMPRESSION: 1. No foci of metabolic activity within the skeleton to localize active multiple myeloma. 2. No lytic lesions identified on the CT portion exam. 3. No abnormal activity at the LEFT frontal craniectomy site. 4. Benign metabolic activity in the RIGHT  iliac bone related to recent bone marrow biopsy. 5. No evidence of soft tissue plasmacytoma Electronically Signed   By: Suzy Bouchard M.D.   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 ANESTHESIA/SEDATION: Moderate (conscious) sedation was employed during this procedure. A total of Versed 4.0 mg and Fentanyl 100 mcg was administered intravenously. Moderate Sedation Time: 10 minutes. The patient's level of consciousness and vital signs were monitored continuously by radiology nursing throughout the procedure under my direct supervision. FLUOROSCOPY TIME:  CT COMPLICATIONS: None PROCEDURE: The procedure risks, benefits, and alternatives were explained to the patient. Questions regarding the procedure were encouraged and answered. The patient understands and consents to the procedure. Scout CT of the pelvis was performed for surgical planning purposes. The posterior pelvis was prepped with Chlorhexidine in a sterile fashion, and a sterile drape was applied covering the operative field. A sterile gown and sterile gloves were used for the procedure. Local anesthesia was provided with 1% Lidocaine. Right posterior iliac bone was targeted for biopsy. The skin and subcutaneous tissues were infiltrated with 1% lidocaine without epinephrine. A small stab incision was made with an 11 blade scalpel, and an 11 gauge Murphy needle was advanced with CT guidance to the posterior cortex.  Manual forced was used to advance the needle through the posterior cortex and the stylet was removed. A bone marrow aspirate was retrieved and passed to a cytotechnologist in the room. The Murphy needle was then advanced without the stylet for a core biopsy. The core biopsy was retrieved and also passed to a cytotechnologist. Manual pressure was used for hemostasis and a sterile dressing was placed. No complications were encountered no significant blood loss was encountered. Patient tolerated the procedure well and remained hemodynamically stable throughout. IMPRESSION: Status post CT-guided bone marrow biopsy, with tissue specimen sent to pathology for complete histopathologic analysis Signed, Dulcy Fanny. Earleen Newport, DO [facility] Radiology Electronically Signed   By: Corrie Mckusick D.O.   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 ANESTHESIA/SEDATION: Moderate (conscious) sedation was employed during this procedure. A total of Versed 4.0 mg and Fentanyl 100 mcg was administered intravenously. Moderate Sedation Time: 10 minutes. The patient's level of consciousness and vital signs were monitored continuously by radiology nursing throughout the procedure under my direct supervision. FLUOROSCOPY TIME:  CT COMPLICATIONS: None PROCEDURE: The procedure risks, benefits, and alternatives were explained to the patient. Questions regarding the procedure were encouraged and answered. The patient understands and consents to the procedure. Scout CT of the pelvis was performed for surgical planning purposes. The posterior pelvis was prepped with Chlorhexidine in a sterile fashion, and a sterile drape was applied covering the operative field. A sterile gown and sterile gloves were used for  the procedure. Local anesthesia was provided with 1% Lidocaine. Right posterior iliac bone was targeted for biopsy. The skin and subcutaneous tissues were infiltrated with 1% lidocaine without epinephrine. A small stab incision was made with an 11 blade scalpel, and an 11 gauge Murphy needle was advanced with CT guidance to the posterior cortex. Manual forced was used to advance the needle through the posterior cortex and the stylet was removed. A bone marrow aspirate  was retrieved and passed to a cytotechnologist in the room. The Murphy needle was then advanced without the stylet for a core biopsy. The core biopsy was retrieved and also passed to a cytotechnologist. Manual pressure was used for hemostasis and a sterile dressing was placed. No complications were encountered no significant blood loss was encountered. Patient tolerated the procedure well and remained hemodynamically stable throughout. IMPRESSION: Status post CT-guided bone marrow biopsy, with tissue specimen sent to pathology for complete histopathologic analysis Signed, Dulcy Fanny. Earleen Newport, DO [facility] Radiology Electronically Signed   By: Corrie Mckusick D.O.   On: 11/08/2019 13:01    Labs:  CBC: Recent Labs    10/04/19 0947 10/10/19 0907 10/10/19 1002 10/11/19 0500 10/31/19 1248 11/08/19 0730  WBC 7.1  --   --  16.6* 9.0 7.1  HGB 12.0   < > 7.8* 9.1* 10.1* 10.4*  HCT 36.1   < > 23.0* 26.5* 30.6* 31.2*  PLT 273  --   --  155 325 254   < > = values in this interval not displayed.    COAGS: Recent Labs    11/08/19 0730  INR 1.0    BMP: Recent Labs    02/01/19 1037 02/01/19 1037 06/01/19 0910 06/01/19 0910 10/06/19 1544 10/06/19 1544 10/10/19 0907 10/10/19 1002 10/11/19 0500 10/31/19 1248  NA 139   < > 144  --  144   < > 142 143 141 139  K 4.0   < > 4.4   < > 4.0   < > 3.6 3.4* 4.2 3.8  CL 97   < > 104  --  106  --   --   --  109 105  CO2 25   < > 25  --  26  --    --   --  23 30  GLUCOSE 78   < > 92  --  78  --   --   --  104* 81  BUN 7   < > 10  --  21*  --   --   --  10 13  CALCIUM 9.7   < > 9.4  --  9.5  --   --   --  8.4* 9.7  CREATININE 0.77   < > 0.77  --  0.80  --   --   --  0.82 0.76  GFRNONAA 87   < > 87  --  >60  --   --   --  >60 >60  GFRAA 101  --  101  --  >60  --   --   --  >60  --    < > = values in this interval not displayed.    LIVER FUNCTION TESTS: Recent Labs    02/01/19 1037 06/01/19 0910 10/31/19 1248  BILITOT 0.8 0.5 0.5  AST _0 ALT _1 ALKPHOS 60 64 65  PROT 6.6 6.0 7.1  ALBUMIN 4.5 4.2 4.1    TUMOR MARKERS: No results for input(s): AFPTM, CEA, CA199, CHROMGRNA in the last 8760 hours.  Assessment and Plan:  New dx Multiple myeloma Scheduled for chemo Mon Nov 15 Planned for [facility] a cath placement in IR today Risks and benefits of image guided port-a-catheter placement was discussed with the patient including, but not limited to bleeding, infection, pneumothorax, or fibrin sheath development and need for additional procedures.  All of the patient's questions were answered, patient is agreeable  to proceed. Consent signed and in chart.   Thank you for this interesting consult.  I greatly enjoyed meeting Ondria Oswald and look forward to participating in their care.  A copy of this report was sent to the requesting provider on this date.  Electronically Signed: Lavonia Drafts, PA-C 12/01/2019, 1:31 PM   I spent a total of  30 Minutes   in face to face in clinical consultation, greater than 50% of which was counseling/coordinating care for [facility] placement

## 2019-12-02 ENCOUNTER — Other Ambulatory Visit: Payer: Self-pay

## 2019-12-02 ENCOUNTER — Encounter (HOSPITAL_COMMUNITY): Payer: Self-pay

## 2019-12-02 ENCOUNTER — Other Ambulatory Visit: Payer: Self-pay | Admitting: *Deleted

## 2019-12-02 ENCOUNTER — Inpatient Hospital Stay: Payer: 59 | Attending: Internal Medicine

## 2019-12-02 DIAGNOSIS — Z5112 Encounter for antineoplastic immunotherapy: Secondary | ICD-10-CM | POA: Insufficient documentation

## 2019-12-02 DIAGNOSIS — C9000 Multiple myeloma not having achieved remission: Secondary | ICD-10-CM | POA: Insufficient documentation

## 2019-12-02 DIAGNOSIS — C7951 Secondary malignant neoplasm of bone: Secondary | ICD-10-CM

## 2019-12-02 NOTE — Telephone Encounter (Signed)
Oral Chemotherapy Pharmacist Encounter   Attempted to reach patient to provide update and offer for initial counseling on oral medication: Revlimid (lenalidomide).   No answer. Left voicemail for patient to call back to discuss details of medication acquisition (medication is being filled by [facility] [phone]) and they require that patient call request medication be filled for expedited shipment.) and initial counseling session.  Leron Croak, PharmD, BCPS Hematology/Oncology Clinical Pharmacist [facility] [phone] 12/02/2019 10:24 AM

## 2019-12-02 NOTE — Telephone Encounter (Addendum)
Oral Chemotherapy Pharmacist Encounter  I spoke with patient for overview of: Revlimid (lenalidomide) for the treatment of newly diagnosed multiple myeloma in conjunction with carfilzomib and dexamethasone, planned for duration of induction treatment.   Counseled patient on administration, dosing, side effects, monitoring, drug-food interactions, safe handling, storage, and disposal.  Patient will take Revlimid 40m capsules, 1 capsule by mouth once daily, without regard to food, with a full glass of water.  Revlimid will be given 21 days on, 7 days off, repeat every 28 days.  Patient will take dexamethasone 466mtablets, 10 tablets (4035mby mouth once weekly on day 22 of each 28 day cycle for the first 4 cycles. Medication will be taken with breakfast. Patient will receive dexamethasone 40 mg on days 1, 8 and 15 while in clinic receiving carfilzomib infusion.   Revlimid start date: 12/06/2019  Adverse effects of Revlimid include but are not limited to: nausea, constipation, diarrhea, abdominal pain, rash, fatigue, drug fever, and decreased blood counts.    Reviewed with patient importance of keeping a medication schedule and plan for any missed doses. No barriers to medication adherence identified.  Medication reconciliation performed and medication/allergy list updated.  Patient is currently taking valacyclovir and has picked up the dexamethasone prescription. Patient instructed that dexamethasone prescription is to be taken on day 22 of each 28 day cycle. Patient counseled on importance of daily aspirin 53m3mr VTE prophylaxis.  Insurance authorization for Revlimid has been obtained.  Revlimid prescription is being dispensed from [facility] pharmacy as it is a limited distribution medication. Phone number for follow-up is: [phone] questions answered.  Ms. SimmAlatorreced understanding and appreciation.   Medication education handout placed in mail for patient. Patient  knows to call the office with questions or concerns. Oral Chemotherapy Clinic phone number provided to patient.   RebeLeron CroakarmD, BCPS Hematology/Oncology Clinical Pharmacist [facility]-[phone] 454512/2021 11:34 AM

## 2019-12-02 NOTE — Telephone Encounter (Signed)
Oral Chemotherapy Pharmacist Encounter  Spoke with representative at [facility] and informed them that they would have to obtain override from [facility] [facility] to fill Revlmid. Revlimid is unable to be filled by [facility] as previously noted due to this being a limited distribution medication. Representative stated they would reach out to [facility] for override to fill prescription and then contact patient for medication shipment.   Patient updated.  Leron Croak, PharmD, BCPS Hematology/Oncology Clinical Pharmacist [facility] [phone] 12/02/2019 4:12 PM

## 2019-12-05 ENCOUNTER — Inpatient Hospital Stay: Payer: 59

## 2019-12-05 ENCOUNTER — Other Ambulatory Visit: Payer: Self-pay

## 2019-12-05 VITALS — BP 135/90 | HR 68 | Temp 98.4°F | Resp 18 | Ht 67.0 in | Wt 163.2 lb

## 2019-12-05 DIAGNOSIS — Z7189 Other specified counseling: Secondary | ICD-10-CM

## 2019-12-05 DIAGNOSIS — C7951 Secondary malignant neoplasm of bone: Secondary | ICD-10-CM

## 2019-12-05 DIAGNOSIS — C9000 Multiple myeloma not having achieved remission: Secondary | ICD-10-CM

## 2019-12-05 DIAGNOSIS — Z5112 Encounter for antineoplastic immunotherapy: Secondary | ICD-10-CM | POA: Diagnosis present

## 2019-12-05 LAB — CBC WITH DIFFERENTIAL (CANCER CENTER ONLY)
Abs Immature Granulocytes: 0.02 10*3/uL (ref 0.00–0.07)
Basophils Absolute: 0.1 10*3/uL (ref 0.0–0.1)
Basophils Relative: 1 %
Eosinophils Absolute: 0.4 10*3/uL (ref 0.0–0.5)
Eosinophils Relative: 5 %
HCT: 32.4 % — ABNORMAL LOW (ref 36.0–46.0)
Hemoglobin: 11 g/dL — ABNORMAL LOW (ref 12.0–15.0)
Immature Granulocytes: 0 %
Lymphocytes Relative: 34 %
Lymphs Abs: 2.4 10*3/uL (ref 0.7–4.0)
MCH: 29.9 pg (ref 26.0–34.0)
MCHC: 34 g/dL (ref 30.0–36.0)
MCV: 88 fL (ref 80.0–100.0)
Monocytes Absolute: 0.5 10*3/uL (ref 0.1–1.0)
Monocytes Relative: 7 %
Neutro Abs: 3.8 10*3/uL (ref 1.7–7.7)
Neutrophils Relative %: 53 %
Platelet Count: 254 10*3/uL (ref 150–400)
RBC: 3.68 MIL/uL — ABNORMAL LOW (ref 3.87–5.11)
RDW: 13.8 % (ref 11.5–15.5)
WBC Count: 7.1 10*3/uL (ref 4.0–10.5)
nRBC: 0 % (ref 0.0–0.2)

## 2019-12-05 LAB — CMP (CANCER CENTER ONLY)
ALT: 17 U/L (ref 0–44)
AST: 21 U/L (ref 15–41)
Albumin: 3.8 g/dL (ref 3.5–5.0)
Alkaline Phosphatase: 69 U/L (ref 38–126)
Anion gap: 8 (ref 5–15)
BUN: 11 mg/dL (ref 6–20)
CO2: 27 mmol/L (ref 22–32)
Calcium: 9.4 mg/dL (ref 8.9–10.3)
Chloride: 107 mmol/L (ref 98–111)
Creatinine: 0.77 mg/dL (ref 0.44–1.00)
GFR, Estimated: >60 mL/min (ref >60)
Glucose, Bld: 85 mg/dL (ref 70–99)
Potassium: 4.1 mmol/L (ref 3.5–5.1)
Sodium: 142 mmol/L (ref 135–145)
Total Bilirubin: 0.5 mg/dL (ref 0.3–1.2)
Total Protein: 6.6 g/dL (ref 6.5–8.1)

## 2019-12-05 MED ORDER — SODIUM CHLORIDE 0.9 % IV SOLN
Freq: Once | INTRAVENOUS | Status: AC
  Filled 2019-12-05: qty 250

## 2019-12-05 MED ORDER — SODIUM CHLORIDE 0.9% FLUSH
10.0000 mL | INTRAVENOUS | Status: DC | PRN
  Administered 2019-12-05: 10 mL
  Filled 2019-12-05: qty 10

## 2019-12-05 MED ORDER — ZOLEDRONIC ACID 4 MG/100ML IV SOLN
INTRAVENOUS | Status: AC
  Filled 2019-12-05: qty 100

## 2019-12-05 MED ORDER — DEXTROSE 5 % IV SOLN
20.5000 mg/m2 | Freq: Once | INTRAVENOUS | Status: AC
  Administered 2019-12-05: 40 mg via INTRAVENOUS
  Filled 2019-12-05: qty 5

## 2019-12-05 MED ORDER — SODIUM CHLORIDE 0.9 % IV SOLN
40.0000 mg | Freq: Once | INTRAVENOUS | Status: AC
  Administered 2019-12-05: 40 mg via INTRAVENOUS
  Filled 2019-12-05: qty 4

## 2019-12-05 MED ORDER — HEPARIN SOD (PORK) LOCK FLUSH 100 UNIT/ML IV SOLN
500.0000 [IU] | Freq: Once | INTRAVENOUS | Status: AC | PRN
  Administered 2019-12-05: 500 [IU]
  Filled 2019-12-05: qty 5

## 2019-12-05 MED ORDER — SODIUM CHLORIDE 0.9% FLUSH
10.0000 mL | Freq: Once | INTRAVENOUS | Status: AC | PRN
  Administered 2019-12-05: 10 mL
  Filled 2019-12-05: qty 10

## 2019-12-05 MED ORDER — ZOLEDRONIC ACID 4 MG/100ML IV SOLN
4.0000 mg | Freq: Once | INTRAVENOUS | Status: AC
  Administered 2019-12-05: 4 mg via INTRAVENOUS

## 2019-12-05 NOTE — Patient Instructions (Signed)

## 2019-12-05 NOTE — Patient Instructions (Addendum)
[facility] Discharge Instructions for Patients Receiving Chemotherapy  Today you received the following chemotherapy agents: Carfilzomib  (Kyprolis)  To help prevent nausea and vomiting after your treatment, we encourage you to take your nausea medication  as prescribed.    If you develop nausea and vomiting that is not controlled by your nausea medication, call the clinic.   BELOW ARE SYMPTOMS THAT SHOULD BE REPORTED IMMEDIATELY:  *FEVER GREATER THAN 100.5 F  *CHILLS WITH OR WITHOUT FEVER  NAUSEA AND VOMITING THAT IS NOT CONTROLLED WITH YOUR NAUSEA MEDICATION  *UNUSUAL SHORTNESS OF BREATH  *UNUSUAL BRUISING OR BLEEDING  TENDERNESS IN MOUTH AND THROAT WITH OR WITHOUT PRESENCE OF ULCERS  *URINARY PROBLEMS  *BOWEL PROBLEMS  UNUSUAL RASH Items with * indicate a potential emergency and should be followed up as soon as possible.  Feel free to call the clinic should you have any questions or concerns. The clinic phone number is [phone].  Please show the [place] at check-in to the Emergency Department and triage nurse.  Carfilzomib injection What is this medicine? CARFILZOMIB (kar FILZ oh mib) targets a specific protein within cancer cells and stops the cancer cells from growing. It is used to treat multiple myeloma. This medicine may be used for other purposes; ask your health care provider or pharmacist if you have questions. COMMON BRAND NAME(S): KYPROLIS What should I tell my health care provider before I take this medicine? They need to know if you have any of these conditions:  heart disease  history of blood clots  irregular heartbeat  kidney disease  liver disease  lung or breathing disease  an unusual or allergic reaction to carfilzomib, or other medicines, foods, dyes, or preservatives  pregnant or trying to get pregnant  breast-feeding How should I use this medicine? This medicine is for injection or infusion into a vein.  It is given by a health care professional in a hospital or clinic setting. Talk to your pediatrician regarding the use of this medicine in children. Special care may be needed. Overdosage: If you think you have taken too much of this medicine contact a poison control center or emergency room at once. NOTE: This medicine is only for you. Do not share this medicine with others. What if I miss a dose? It is important not to miss your dose. Call your doctor or health care professional if you are unable to keep an appointment. What may interact with this medicine? Interactions are not expected. Give your health care provider a list of all the medicines, herbs, non-prescription drugs, or dietary supplements you use. Also tell them if you smoke, drink alcohol, or use illegal drugs. Some items may interact with your medicine. This list may not describe all possible interactions. Give your health care provider a list of all the medicines, herbs, non-prescription drugs, or dietary supplements you use. Also tell them if you smoke, drink alcohol, or use illegal drugs. Some items may interact with your medicine. What should I watch for while using this medicine? Your condition will be monitored carefully while you are receiving this medicine. Report any side effects. Continue your course of treatment even though you feel ill unless your doctor tells you to stop. You may need blood work done while you are taking this medicine. Do not become pregnant while taking this medicine or for at least 6 months after stopping it. Women should inform their doctor if they wish to become pregnant or think they might be  pregnant. There is a potential for serious side effects to an unborn child. Men should not father a child while taking this medicine and for at least 3 months after stopping it. Talk to your health care professional or pharmacist for more information. Do not breast-feed an infant while taking this medicine or for 2  weeks after the last dose. Check with your doctor or health care professional if you get an attack of severe diarrhea, nausea and vomiting, or if you sweat a lot. The loss of too much body fluid can make it dangerous for you to take this medicine. You may get dizzy. Do not drive, use machinery, or do anything that needs mental alertness until you know how this medicine affects you. Do not stand or sit up quickly, especially if you are an older patient. This reduces the risk of dizzy or fainting spells. What side effects may I notice from receiving this medicine? Side effects that you should report to your doctor or health care professional as soon as possible:  allergic reactions like skin rash, itching or hives, swelling of the face, lips, or tongue  confusion  dizziness  feeling faint or lightheaded  fever or chills  palpitations  seizures  signs and symptoms of bleeding such as bloody or black, tarry stools; red or dark-brown urine; spitting up blood or brown material that looks like coffee grounds; red spots on the skin; unusual bruising or bleeding including from the eye, gums, or nose  signs and symptoms of a blood clot such as breathing problems; changes in vision; chest pain; severe, sudden headache; pain, swelling, warmth in the leg; trouble speaking; sudden numbness or weakness of the face, arm or leg  signs and symptoms of kidney injury like trouble passing urine or change in the amount of urine  signs and symptoms of liver injury like dark yellow or brown urine; general ill feeling or flu-like symptoms; light-colored stools; loss of appetite; nausea; right upper belly pain; unusually weak or tired; yellowing of the eyes or skin Side effects that usually do not require medical attention (report to your doctor or health care professional if they continue or are bothersome):  back pain  cough  diarrhea  headache  muscle cramps  trouble sleeping  vomiting This list  may not describe all possible side effects. Call your doctor for medical advice about side effects. You may report side effects to FDA at 1-800-FDA-1088. Where should I keep my medicine? This drug is given in a hospital or clinic and will not be stored at home. NOTE: This sheet is a summary. It may not cover all possible information. If you have questions about this medicine, talk to your doctor, pharmacist, or health care provider.  2020 Elsevier/Gold Standard (2018-09-13 19:44:21)  Zoledronic Acid injection (Hypercalcemia, Oncology) What is this medicine? ZOLEDRONIC ACID (ZOE le dron ik AS id) lowers the amount of calcium loss from bone. It is used to treat too much calcium in your blood from cancer. It is also used to prevent complications of cancer that has spread to the bone. This medicine may be used for other purposes; ask your health care provider or pharmacist if you have questions. COMMON BRAND NAME(S): Zometa What should I tell my health care provider before I take this medicine? They need to know if you have any of these conditions:  aspirin-sensitive asthma  cancer, especially if you are receiving medicines used to treat cancer  dental disease or wear dentures  infection  kidney  disease  receiving corticosteroids like dexamethasone or prednisone  an unusual or allergic reaction to zoledronic acid, other medicines, foods, dyes, or preservatives  pregnant or trying to get pregnant  breast-feeding How should I use this medicine? This medicine is for infusion into a vein. It is given by a health care professional in a hospital or clinic setting. Talk to your pediatrician regarding the use of this medicine in children. Special care may be needed. Overdosage: If you think you have taken too much of this medicine contact a poison control center or emergency room at once. NOTE: This medicine is only for you. Do not share this medicine with others. What if I miss a dose? It  is important not to miss your dose. Call your doctor or health care professional if you are unable to keep an appointment. What may interact with this medicine?  certain antibiotics given by injection  NSAIDs, medicines for pain and inflammation, like ibuprofen or naproxen  some diuretics like bumetanide, furosemide  teriparatide  thalidomide This list may not describe all possible interactions. Give your health care provider a list of all the medicines, herbs, non-prescription drugs, or dietary supplements you use. Also tell them if you smoke, drink alcohol, or use illegal drugs. Some items may interact with your medicine. What should I watch for while using this medicine? Visit your doctor or health care professional for regular checkups. It may be some time before you see the benefit from this medicine. Do not stop taking your medicine unless your doctor tells you to. Your doctor may order blood tests or other tests to see how you are doing. Women should inform their doctor if they wish to become pregnant or think they might be pregnant. There is a potential for serious side effects to an unborn child. Talk to your health care professional or pharmacist for more information. You should make sure that you get enough calcium and vitamin D while you are taking this medicine. Discuss the foods you eat and the vitamins you take with your health care professional. Some people who take this medicine have severe bone, joint, and/or muscle pain. This medicine may also increase your risk for jaw problems or a broken thigh bone. Tell your doctor right away if you have severe pain in your jaw, bones, joints, or muscles. Tell your doctor if you have any pain that does not go away or that gets worse. Tell your dentist and dental surgeon that you are taking this medicine. You should not have major dental surgery while on this medicine. See your dentist to have a dental exam and fix any dental problems before  starting this medicine. Take good care of your teeth while on this medicine. Make sure you see your dentist for regular follow-up appointments. What side effects may I notice from receiving this medicine? Side effects that you should report to your doctor or health care professional as soon as possible:  allergic reactions like skin rash, itching or hives, swelling of the face, lips, or tongue  anxiety, confusion, or depression  breathing problems  changes in vision  eye pain  feeling faint or lightheaded, falls  jaw pain, especially after dental work  mouth sores  muscle cramps, stiffness, or weakness  redness, blistering, peeling or loosening of the skin, including inside the mouth  trouble passing urine or change in the amount of urine Side effects that usually do not require medical attention (report to your doctor or health care professional if they  continue or are bothersome):  bone, joint, or muscle pain  constipation  diarrhea  fever  hair loss  irritation at site where injected  loss of appetite  nausea, vomiting  stomach upset  trouble sleeping  trouble swallowing  weak or tired This list may not describe all possible side effects. Call your doctor for medical advice about side effects. You may report side effects to FDA at 1-800-FDA-1088. Where should I keep my medicine? This drug is given in a hospital or clinic and will not be stored at home. NOTE: This sheet is a summary. It may not cover all possible information. If you have questions about this medicine, talk to your doctor, pharmacist, or health care provider.  2020 Elsevier/Gold Standard (2013-06-04 14:19:39)

## 2019-12-05 NOTE — Telephone Encounter (Signed)
Oral Chemotherapy Pharmacist Encounter  Spoke with representative from [facility] to follow-up the status of the patient's Revlimid (lenalidomide)  Stated medication is currently being set up for delivery with patient. Patient aware to start medication once received from specialty pharmacy.  Leron Croak, PharmD, BCPS Hematology/Oncology Clinical Pharmacist [facility] [phone] 12/05/2019 10:01 AM

## 2019-12-06 ENCOUNTER — Inpatient Hospital Stay: Payer: 59

## 2019-12-06 ENCOUNTER — Other Ambulatory Visit: Payer: Self-pay

## 2019-12-06 VITALS — BP 132/82 | HR 72 | Temp 98.5°F | Resp 18

## 2019-12-06 DIAGNOSIS — Z7189 Other specified counseling: Secondary | ICD-10-CM

## 2019-12-06 DIAGNOSIS — C7951 Secondary malignant neoplasm of bone: Secondary | ICD-10-CM

## 2019-12-06 DIAGNOSIS — C9000 Multiple myeloma not having achieved remission: Secondary | ICD-10-CM

## 2019-12-06 DIAGNOSIS — Z5112 Encounter for antineoplastic immunotherapy: Secondary | ICD-10-CM | POA: Diagnosis not present

## 2019-12-06 LAB — KAPPA/LAMBDA LIGHT CHAINS
Kappa free light chain: 14.2 mg/L (ref 3.3–19.4)
Kappa, lambda light chain ratio: 0.01 — ABNORMAL LOW (ref 0.26–1.65)
Lambda free light chains: 1779.7 mg/L — ABNORMAL HIGH (ref 5.7–26.3)

## 2019-12-06 LAB — MULTIPLE MYELOMA PANEL, SERUM
Albumin SerPl Elph-Mcnc: 3.4 g/dL (ref 2.9–4.4)
Albumin/Glob SerPl: 1.5 (ref 0.7–1.7)
Alpha 1: 0.2 g/dL (ref 0.0–0.4)
Alpha2 Glob SerPl Elph-Mcnc: 0.7 g/dL (ref 0.4–1.0)
B-Globulin SerPl Elph-Mcnc: 0.9 g/dL (ref 0.7–1.3)
Gamma Glob SerPl Elph-Mcnc: 0.6 g/dL (ref 0.4–1.8)
Globulin, Total: 2.4 g/dL (ref 2.2–3.9)
IgA: 47 mg/dL — ABNORMAL LOW (ref 87–352)
IgG (Immunoglobin G), Serum: 639 mg/dL (ref 586–1602)
IgM (Immunoglobulin M), Srm: 11 mg/dL — ABNORMAL LOW (ref 26–217)
Total Protein ELP: 5.8 g/dL — ABNORMAL LOW (ref 6.0–8.5)

## 2019-12-06 MED ORDER — SODIUM CHLORIDE 0.9 % IV SOLN
Freq: Once | INTRAVENOUS | Status: DC
  Filled 2019-12-06: qty 250

## 2019-12-06 MED ORDER — HEPARIN SOD (PORK) LOCK FLUSH 100 UNIT/ML IV SOLN
500.0000 [IU] | Freq: Once | INTRAVENOUS | Status: AC | PRN
  Administered 2019-12-06: 500 [IU]
  Filled 2019-12-06: qty 5

## 2019-12-06 MED ORDER — SODIUM CHLORIDE 0.9% FLUSH
10.0000 mL | INTRAVENOUS | Status: DC | PRN
  Administered 2019-12-06: 10 mL
  Filled 2019-12-06: qty 10

## 2019-12-06 MED ORDER — SODIUM CHLORIDE 0.9 % IV SOLN
Freq: Once | INTRAVENOUS | Status: AC
  Filled 2019-12-06: qty 250

## 2019-12-06 MED ORDER — DEXTROSE 5 % IV SOLN
20.5000 mg/m2 | Freq: Once | INTRAVENOUS | Status: AC
  Administered 2019-12-06: 40 mg via INTRAVENOUS
  Filled 2019-12-06: qty 15

## 2019-12-06 MED ORDER — PROCHLORPERAZINE MALEATE 10 MG PO TABS
10.0000 mg | ORAL_TABLET | Freq: Once | ORAL | Status: AC
  Administered 2019-12-06: 10 mg via ORAL

## 2019-12-06 MED ORDER — PROCHLORPERAZINE MALEATE 10 MG PO TABS
ORAL_TABLET | ORAL | Status: AC
  Filled 2019-12-06: qty 1

## 2019-12-06 NOTE — Patient Instructions (Signed)
[facility] Discharge Instructions for Patients Receiving Chemotherapy  Today you received the following chemotherapy agents:  Kyprolis  To help prevent nausea and vomiting after your treatment, we encourage you to take your nausea medication as directed.   If you develop nausea and vomiting that is not controlled by your nausea medication, call the clinic.   BELOW ARE SYMPTOMS THAT SHOULD BE REPORTED IMMEDIATELY:  *FEVER GREATER THAN 100.5 F  *CHILLS WITH OR WITHOUT FEVER  NAUSEA AND VOMITING THAT IS NOT CONTROLLED WITH YOUR NAUSEA MEDICATION  *UNUSUAL SHORTNESS OF BREATH  *UNUSUAL BRUISING OR BLEEDING  TENDERNESS IN MOUTH AND THROAT WITH OR WITHOUT PRESENCE OF ULCERS  *URINARY PROBLEMS  *BOWEL PROBLEMS  UNUSUAL RASH Items with * indicate a potential emergency and should be followed up as soon as possible.  Feel free to call the clinic should you have any questions or concerns. The clinic phone number is [phone].  Please show the CHEMO ALERT CARD at check-in to the Emergency Department and triage nurse.   

## 2019-12-06 NOTE — Telephone Encounter (Signed)
Oral Oncology Patient Advocate Encounter  Prior Authorization for Revlimid has been approved.    PA# [id number] Effective dates: 11/28/19 through 11/27/20   Oral Oncology Clinic will continue to follow.    [facility] Patient [place] Phone [phone] Fax [phone] 12/06/2019 12:02 PM

## 2019-12-07 ENCOUNTER — Telehealth: Payer: Self-pay | Admitting: *Deleted

## 2019-12-07 NOTE — Telephone Encounter (Signed)
Pt read on Revlimid info that it can cause blood clots & she should be on a blood thinner.  Reminded pt that we discussed this with her education & she has asa 81 mg daily on her med sheet.  She states she took aspirin before her treatment & hasn't taken today.  Encouraged to start back today on her aspirin & she will start her revlimid today.  Message to Dr Launa Flight RN

## 2019-12-07 NOTE — Telephone Encounter (Signed)
Called pt to see how she did with her recent treatment.  She reports that she slept poorly on Monday & had some joint aches yesterday & took tylenol which helped.  She denies any other symptoms.  She has nausea med & knows how to reach [place] with concerns. She states her Revlimid is to be shipped for delivery today.

## 2019-12-07 NOTE — Telephone Encounter (Signed)
-----   Message from Jolaine Click, RN sent at 12/05/2019  3:02 PM EST ----- Regarding: First Time Kyprolis and Zometa - Dr. Irene Limbo Patient First Time Kyprolis and Zometa - Dr. Irene Limbo Patient

## 2019-12-12 ENCOUNTER — Other Ambulatory Visit: Payer: Self-pay

## 2019-12-12 ENCOUNTER — Inpatient Hospital Stay: Payer: 59

## 2019-12-12 ENCOUNTER — Other Ambulatory Visit: Payer: Self-pay | Admitting: *Deleted

## 2019-12-12 ENCOUNTER — Inpatient Hospital Stay (HOSPITAL_BASED_OUTPATIENT_CLINIC_OR_DEPARTMENT_OTHER): Payer: 59 | Admitting: Hematology

## 2019-12-12 VITALS — BP 117/74 | HR 66 | Temp 97.9°F | Resp 20 | Ht 67.0 in | Wt 164.8 lb

## 2019-12-12 DIAGNOSIS — C9000 Multiple myeloma not having achieved remission: Secondary | ICD-10-CM

## 2019-12-12 DIAGNOSIS — C7951 Secondary malignant neoplasm of bone: Secondary | ICD-10-CM

## 2019-12-12 DIAGNOSIS — Z5112 Encounter for antineoplastic immunotherapy: Secondary | ICD-10-CM | POA: Diagnosis not present

## 2019-12-12 DIAGNOSIS — Z7189 Other specified counseling: Secondary | ICD-10-CM

## 2019-12-12 DIAGNOSIS — C9030 Solitary plasmacytoma not having achieved remission: Secondary | ICD-10-CM

## 2019-12-12 DIAGNOSIS — D649 Anemia, unspecified: Secondary | ICD-10-CM

## 2019-12-12 LAB — CMP (CANCER CENTER ONLY)
ALT: 18 U/L (ref 0–44)
AST: 23 U/L (ref 15–41)
Albumin: 3.7 g/dL (ref 3.5–5.0)
Alkaline Phosphatase: 63 U/L (ref 38–126)
Anion gap: 7 (ref 5–15)
BUN: 18 mg/dL (ref 6–20)
CO2: 26 mmol/L (ref 22–32)
Calcium: 8.8 mg/dL — ABNORMAL LOW (ref 8.9–10.3)
Chloride: 106 mmol/L (ref 98–111)
Creatinine: 0.77 mg/dL (ref 0.44–1.00)
GFR, Estimated: >60 mL/min (ref >60)
Glucose, Bld: 85 mg/dL (ref 70–99)
Potassium: 4.4 mmol/L (ref 3.5–5.1)
Sodium: 139 mmol/L (ref 135–145)
Total Bilirubin: 0.3 mg/dL (ref 0.3–1.2)
Total Protein: 6.5 g/dL (ref 6.5–8.1)

## 2019-12-12 LAB — CBC WITH DIFFERENTIAL (CANCER CENTER ONLY)
Abs Immature Granulocytes: 0.1 10*3/uL — ABNORMAL HIGH (ref 0.00–0.07)
Basophils Absolute: 0 10*3/uL (ref 0.0–0.1)
Basophils Relative: 0 %
Eosinophils Absolute: 0.6 10*3/uL — ABNORMAL HIGH (ref 0.0–0.5)
Eosinophils Relative: 6 %
HCT: 32.5 % — ABNORMAL LOW (ref 36.0–46.0)
Hemoglobin: 10.8 g/dL — ABNORMAL LOW (ref 12.0–15.0)
Immature Granulocytes: 1 %
Lymphocytes Relative: 23 %
Lymphs Abs: 2 10*3/uL (ref 0.7–4.0)
MCH: 29.3 pg (ref 26.0–34.0)
MCHC: 33.2 g/dL (ref 30.0–36.0)
MCV: 88.3 fL (ref 80.0–100.0)
Monocytes Absolute: 0.9 10*3/uL (ref 0.1–1.0)
Monocytes Relative: 10 %
Neutro Abs: 5 10*3/uL (ref 1.7–7.7)
Neutrophils Relative %: 60 %
Platelet Count: 216 10*3/uL (ref 150–400)
RBC: 3.68 MIL/uL — ABNORMAL LOW (ref 3.87–5.11)
RDW: 14.3 % (ref 11.5–15.5)
WBC Count: 8.6 10*3/uL (ref 4.0–10.5)
nRBC: 0 % (ref 0.0–0.2)

## 2019-12-12 MED ORDER — SODIUM CHLORIDE 0.9 % IV SOLN
Freq: Once | INTRAVENOUS | Status: AC
  Filled 2019-12-12: qty 250

## 2019-12-12 MED ORDER — DEXTROSE 5 % IV SOLN
36.0000 mg/m2 | Freq: Once | INTRAVENOUS | Status: AC
  Administered 2019-12-12: 70 mg via INTRAVENOUS
  Filled 2019-12-12: qty 30

## 2019-12-12 MED ORDER — SODIUM CHLORIDE 0.9% FLUSH
10.0000 mL | Freq: Once | INTRAVENOUS | Status: AC | PRN
  Administered 2019-12-12: 10 mL
  Filled 2019-12-12: qty 10

## 2019-12-12 MED ORDER — SODIUM CHLORIDE 0.9% FLUSH
10.0000 mL | INTRAVENOUS | Status: DC | PRN
  Administered 2019-12-12: 10 mL
  Filled 2019-12-12: qty 10

## 2019-12-12 MED ORDER — SODIUM CHLORIDE 0.9 % IV SOLN
40.0000 mg | Freq: Once | INTRAVENOUS | Status: AC
  Administered 2019-12-12: 40 mg via INTRAVENOUS
  Filled 2019-12-12: qty 4

## 2019-12-12 MED ORDER — HEPARIN SOD (PORK) LOCK FLUSH 100 UNIT/ML IV SOLN
500.0000 [IU] | Freq: Once | INTRAVENOUS | Status: AC | PRN
  Administered 2019-12-12: 500 [IU]
  Filled 2019-12-12: qty 5

## 2019-12-12 NOTE — Patient Instructions (Signed)
[facility] Discharge Instructions for Patients Receiving Chemotherapy  Today you received the following chemotherapy agents: carfilzomib.  To help prevent nausea and vomiting after your treatment, we encourage you to take your nausea medication as directed.   If you develop nausea and vomiting that is not controlled by your nausea medication, call the clinic.   BELOW ARE SYMPTOMS THAT SHOULD BE REPORTED IMMEDIATELY:  *FEVER GREATER THAN 100.5 F  *CHILLS WITH OR WITHOUT FEVER  NAUSEA AND VOMITING THAT IS NOT CONTROLLED WITH YOUR NAUSEA MEDICATION  *UNUSUAL SHORTNESS OF BREATH  *UNUSUAL BRUISING OR BLEEDING  TENDERNESS IN MOUTH AND THROAT WITH OR WITHOUT PRESENCE OF ULCERS  *URINARY PROBLEMS  *BOWEL PROBLEMS  UNUSUAL RASH Items with * indicate a potential emergency and should be followed up as soon as possible.  Feel free to call the clinic should you have any questions or concerns. The clinic phone number is [phone].  Please show the CHEMO ALERT CARD at check-in to the Emergency Department and triage nurse.   

## 2019-12-12 NOTE — Progress Notes (Signed)
HEMATOLOGY/ONCOLOGY CONSULTATION NOTE  Date of Service: 12/12/2019  Patient Care Team: [name], FNP as PCP - General (General Practice)  CHIEF COMPLAINTS/PURPOSE OF CONSULTATION:  Multiple myeloma  HISTORY OF PRESENTING ILLNESS:   Alexandra [name] is a wonderful 54 y.o. female who has been referred to [place] by Dr. 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 The pt reports that she is doing well overall.   The pt reports that she was taking Benazepril-HCTZ for her HTN but was told to discontinue as she was becoming hypotensive while in the hospital. She continues taking Lipitor for her HLD and was previously on Ozempic for her Type 2 Diabetes. She has no significant family history of cancers or blood disorders. Pt has no known medication allergies. Pt had a hammer toe surgery and a tubal ligation. 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   In mid-June she began to notice a growth on the left side of her forehead. Because the lump continued to grow she visited her PCP at the end of June. Her PCP and Dermatologist thought that it was a lipoma initally. She was sent for a consult with Dr. Harlow Mares at [facility] in August. She received her first cranial MRI on 10/05/2019, which revealed a mass and bone erosion. Pt had her initial Craniectomy on 10/11/2019 and a Left Bifrontal Cranioplasty on 10/14/2019. Pt denies any new bone pain, fevers, chills, or night sweats at the time. Pt has lost 40 lbs over the last year, but this was after the addition of Ozempic and improved dietary and exercise habits. She has started regaining weight after holding Ozempic. At this time pt reports mild discomfort at surgical site and no other pertinent symptoms.  Of note prior to the patient's visit today, pt has had Surgical Pathology [id number]) completed on 10/10/2019 with results revealing "A. BRAIN  TUMOR, LEFT FRONTAL, RESECTION: - Plasma cell neoplasm B. BRAIN TUMOR, LEFT FRONTAL, RESECTION: - Plasma cell neoplasm C. BONE, LEFT FRONTAL, RESECTION: - Plasma cell neoplasm."   Pt has had MRI Brain ([id number]) completed on 10/11/2019 with results revealing "No complicating feature or residual tumor seen after left frontal bone mass resection."  Pt has had MRI Brain ([id number]) completed on 10/05/2019 with results revealing "1. Large extra-axial mass in the anterior left frontal region with bone erosion and extension into the scalp soft tissues. Considerations include hemangiopericytoma, metastasis, and aggressive meningioma. 2. 6 mm enhancing lesion in the clivus. This is indeterminate but does raise concern for metastatic disease. 3. Mild chronic small vessel ischemic disease."  Most recent lab results (10/11/2019) of CBC & BMP is as follows: all values are WNL except for WBC at 16.6K, RBC at 2.99, Hgb at 9.1, HCT at 26.5, Glucose at 104, Calcium at 8.4.  On review of systems, pt reports discomfort at surgicalsite and denies balance issues, memory loss, headaches, fevers, chills, unexpected weight loss and any othe symptoms.   On PMHx the pt reports HTN, HLD, Diabetes, Tubal Ligation, Hammer Toe Surgery, Herpes. On Social Hx the pt reports that she previously smoked on and off but is not currently smoking and does not drink much alcohol. On Family Hx the pt reports a grandfather with prostate cancer.  INTERVAL HISTORY:  Alexandra [name] is a wonderful 54 y.o. 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 The patient's last visit with [place] was on  11/18/2019. The pt reports that she is doing well overall.  The pt reports that she has been well since starting Carfilzomib and Revlimid. Pt is taking a baby Aspirin as recommended. She has had her Port-a-cath placement and denies any issues. Pt is experiencing swelling in the area surrounding her forehead  prosthetic. Pt has spoken to the surgeon about this, who feels that it will take a few months to resolve. She has received her COVID19 vaccines and booster, as well as the annual flu vaccine. Pt has some occasional abdominal discomfort but continues having regular bowel movements.   Lab results today (12/12/19) of CBC w/diff and CMP is as follows: all values are WNL except for RBC at 3.68, Hgb at 10.8, HCT at 32.5, Eos Abs at 0.6K, Abs Immature Granulocytes at 0.10K, Calcium at 8.8.  On review of systems, pt reports forehead swelling, occasional LUQ discomfort and denies N/V/D, rash, chest pain, diarrhea, constipation pain at port site and any other symptoms.    MEDICAL HISTORY:  Past Medical History:  Diagnosis Date  . Anxiety   . Hypertension   . Pre-diabetes     SURGICAL HISTORY: Past Surgical History:  Procedure Laterality Date  . ABLATION    . APPLICATION OF CRANIAL NAVIGATION N/A 10/10/2019   Procedure: APPLICATION OF CRANIAL NAVIGATION;  Surgeon: Consuella Lose, MD;  Location: [facility];  Service: Neurosurgery;  Laterality: N/A;  APPLICATION OF CRANIAL NAVIGATION  . CRANIOPLASTY Left 10/14/2019   Procedure: LEFT CRANIOPLASTY WITH PLACEMENT OF CUSTOM CRANIAL IMPLANT;  Surgeon: Consuella Lose, MD;  Location: [place];  Service: Neurosurgery;  Laterality: Left;  . CRANIOTOMY Left 10/10/2019   Procedure: Left frontal Stereotactic craniectomy for resection of tumor;  Surgeon: Consuella Lose, MD;  Location: [place];  Service: Neurosurgery;  Laterality: Left;  Left frontal Stereotactic craniectomy for resection of tumor  . HAMMER TOE SURGERY Left   . IR IMAGING GUIDED PORT INSERTION  12/01/2019  . TUBAL LIGATION      SOCIAL HISTORY: Social History   Socioeconomic History  . Marital status: Single    Spouse name: Not on file  . Number of children: Not on file  . Years of education: Not on file  . Highest education level: Not on file  Occupational History  . Not on file   Tobacco Use  . Smoking status: Never Smoker  . Smokeless tobacco: Never Used  Vaping Use  . Vaping Use: Never used  Substance and Sexual Activity  . Alcohol use: Yes    Comment: occassionally  . Drug use: Not Currently  . Sexual activity: Not on file  Other Topics Concern  . Not on file  Social History Narrative  . Not on file   Social Determinants of Health   Financial Resource Strain:   . Difficulty of Paying Living Expenses: Not on file  Food Insecurity:   . Worried About Charity fundraiser in the Last Year: Not on file  . Ran Out of Food in the Last Year: Not on file  Transportation Needs:   . Lack of Transportation (Medical): Not on file  . Lack of Transportation (Non-Medical): Not on file  Physical Activity:   . Days of Exercise per Week: Not on file  . Minutes of Exercise per Session: Not on file  Stress:   . Feeling of Stress : Not on file  Social Connections:   . Frequency of Communication with Friends and Family: Not on file  . Frequency of Social Gatherings with  Friends and Family: Not on file  . Attends Religious Services: Not on file  . Active Member of Clubs or Organizations: Not on file  . Attends Archivist Meetings: Not on file  . Marital Status: Not on file  Intimate Partner Violence:   . Fear of Current or Ex-Partner: Not on file  . Emotionally Abused: Not on file  . Physically Abused: Not on file  . Sexually Abused: Not on file    FAMILY HISTORY: Family History  Problem Relation Age of Onset  . Breast cancer Maternal Grandmother   . Heart disease Mother   . Hypertension Mother   . Sarcoidosis Father   . Diabetes Maternal Grandfather     ALLERGIES:  has No Known Allergies.  MEDICATIONS:  Current Outpatient Medications  Medication Sig Dispense Refill  . acetaminophen (TYLENOL) 650 MG CR tablet Take 1,300 mg by mouth every 8 (eight) hours as needed for pain.    Marland Kitchen aspirin EC 81 MG tablet Take 1 tablet (81 mg total) by mouth  daily. 100 tablet 3  . atorvastatin (LIPITOR) 10 MG tablet TAKE 1 TABLET BY MOUTH EVERY DAY (Patient taking differently: Take 10 mg by mouth daily. ) 90 tablet 1  . benazepril-hydrochlorthiazide (LOTENSIN HCT) 20-12.5 MG tablet TAKE 1 TABLET BY MOUTH EVERY DAY (Patient not taking: Reported on 11/30/2019) 90 tablet 1  . Cholecalciferol (VITAMIN D) 50 MCG (2000 UT) tablet Take 2,000 Units by mouth daily.    . Coenzyme Q10 (COQ-10) 100 MG CAPS Take 200 mg by mouth daily.    Marland Kitchen dexamethasone (DECADRON) 4 MG tablet Take 10 tablets (28m) once on day 22. Repeat every 28 days for the first 4 cycles. Take with breakfast. 40 tablet 4  . Eszopiclone 3 MG TABS Take 1 tablet (3 mg total) by mouth at bedtime. Take immediately before bedtime (Patient taking differently: Take 3 mg by mouth at bedtime as needed (sleep). Take immediately before bedtime) 30 tablet 5  . Insulin Pen Needle (NOVOFINE PLUS) 32G X 4 MM MISC Inject 1 pen into the skin daily. Inject 1 pen with Victoza SQ 100 each 3  . [place] Panax Ginseng 100 MG CAPS Take 100 mg by mouth daily.    .Marland Kitchenlenalidomide (REVLIMID) 15 MG capsule Take 1 capsule (15 mg total) by mouth daily. Take for 21 days. Hold 7 days. Repeat every 28 days. Celegene AJosem Kaufmann##[id number] Date Obtained: 11/28/2019 21 capsule 0  . levocetirizine (XYZAL) 5 MG tablet TAKE 1 TABLET BY MOUTH EVERY DAY IN THE EVENING (Patient taking differently: Take 5 mg by mouth every evening. ) 30 tablet 2  . lidocaine-prilocaine (EMLA) cream Apply to affected area once (Patient taking differently: Apply 1 application topically daily as needed (port access). ) 30 g 3  . LORazepam (ATIVAN) 0.5 MG tablet Take 1 tablet (0.5 mg total) by mouth every 6 (six) hours as needed (Nausea or vomiting). 30 tablet 0  . Multiple Vitamin (MULTIVITAMIN WITH MINERALS) TABS tablet Take 1 tablet by mouth daily.    . Multiple Vitamins-Minerals (IMMUNE SUPPORT PO) Take 2 tablets by mouth daily.    . ondansetron (ZOFRAN) 8 MG tablet  Take 1 tablet (8 mg total) by mouth 2 (two) times daily as needed (Nausea or vomiting). 30 tablet 1  . oxyCODONE-acetaminophen (PERCOCET) 5-325 MG tablet Take 1 tablet by mouth every 4 (four) hours as needed for severe pain. (Patient not taking: Reported on 11/30/2019) 30 tablet 0  . Oxymetazoline HCl (AFRIN NODRIP SEVERE CONGEST  NA) Place 1 spray into the nose daily as needed (congestion).    Marland Kitchen OZEMPIC, 0.25 OR 0.5 MG/DOSE, 2 MG/1.5ML SOPN INJECT 0.5 MG TOTAL INTO THE SKIN ONCE A WEEK. (Patient taking differently: Inject 0.5 mg as directed every Friday. ) 4.5 mL 1  . prochlorperazine (COMPAZINE) 10 MG tablet Take 1 tablet (10 mg total) by mouth every 6 (six) hours as needed (Nausea or vomiting). 30 tablet 1  . senna (SENOKOT) 8.6 MG TABS tablet Take 17.2 mg by mouth daily as needed for mild constipation.    . Turmeric Curcumin 500 MG CAPS Take 500 mg by mouth daily.    . valACYclovir (VALTREX) 1000 MG tablet Take 1 tablet (1,000 mg total) by mouth daily. Patient taking it as needed (Patient taking differently: Take 1,000 mg by mouth daily. ) 90 tablet 1  . vitamin B-12 (CYANOCOBALAMIN) 1000 MCG tablet Take 1,000 mcg by mouth daily.     No current facility-administered medications for this visit.   Facility-Administered Medications Ordered in Other Visits  Medication Dose Route Frequency Provider Last Rate Last Admin  . carfilzomib (KYPROLIS) 70 mg in dextrose 5 % 100 mL chemo infusion  36 mg/m2 (Treatment Plan Recorded) Intravenous Once Brunetta Genera, MD      . heparin lock flush 100 unit/mL  500 Units Intracatheter Once PRN Brunetta Genera, MD      . sodium chloride flush (NS) 0.9 % injection 10 mL  10 mL Intracatheter PRN Brunetta Genera, MD        REVIEW OF SYSTEMS:   A 10+ POINT REVIEW OF SYSTEMS WAS OBTAINED including neurology, dermatology, psychiatry, cardiac, respiratory, lymph, extremities, GI, GU, Musculoskeletal, constitutional, breasts, reproductive, HEENT.  All  pertinent positives are noted in the HPI.  All others are negative.   PHYSICAL EXAMINATION: ECOG PERFORMANCE STATUS: 1 - Symptomatic but completely ambulatory  . Vitals:   12/12/19 1150  BP: 117/74  Pulse: 66  Resp: 20  Temp: 97.9 F (36.6 C)   Filed Weights   12/12/19 1150  Weight: 164 lb 12.8 oz (74.8 kg)   .Body mass index is 25.81 kg/m.   GENERAL:alert, in no acute distress and comfortable SKIN: no acute rashes, no significant lesions EYES: conjunctiva are pink and non-injected, sclera anicteric OROPHARYNX: MMM, no exudates, no oropharyngeal erythema or ulceration NECK: supple, no JVD LYMPH:  no palpable lymphadenopathy in the cervical, axillary or inguinal regions LUNGS: clear to auscultation b/l with normal respiratory effort HEART: regular rate & rhythm ABDOMEN:  normoactive bowel sounds , non tender, not distended. No palpable hepatosplenomegaly.  Extremity: no pedal edema PSYCH: alert & oriented x 3 with fluent speech NEURO: no focal motor/sensory deficits  LABORATORY DATA:  I have reviewed the data as listed  . CBC Latest Ref Rng & Units 12/12/2019 12/05/2019 11/08/2019  WBC 4.0 - 10.5 K/uL 8.6 7.1 7.1  Hemoglobin 12.0 - 15.0 g/dL 10.8(L) 11.0(L) 10.4(L)  Hematocrit 36 - 46 % 32.5(L) 32.4(L) 31.2(L)  Platelets 150 - 400 K/uL 216 254 254    . CMP Latest Ref Rng & Units 12/12/2019 12/05/2019 10/31/2019  Glucose 70 - 99 mg/dL 85 85 81  BUN 6 - 20 mg/dL 18 11 13   Creatinine 0.44 - 1.00 mg/dL 0.77 0.77 0.76  Sodium 135 - 145 mmol/L 139 142 139  Potassium 3.5 - 5.1 mmol/L 4.4 4.1 3.8  Chloride 98 - 111 mmol/L 106 107 105  CO2 22 - 32 mmol/L 26 27 30   Calcium 8.9 -  10.3 mg/dL 8.8(L) 9.4 9.7  Total Protein 6.5 - 8.1 g/dL 6.5 6.6 7.1  Total Bilirubin 0.3 - 1.2 mg/dL 0.3 0.5 0.5  Alkaline Phos 38 - 126 U/L 63 69 65  AST 15 - 41 U/L 23 21 16   ALT 0 - 44 U/L 18 17 18    11/08/2019 Bone Marrow Report [id number]):   10/10/2019 Surgical Pathology  [phone]):   RADIOGRAPHIC STUDIES:  I have personally reviewed the radiological images as listed and agreed with the findings in the report. IR IMAGING GUIDED PORT INSERTION  Result Date: 12/01/2019 INDICATION: 54 year old female with history of multiple myeloma requiring central venous access for chemotherapy. EXAM: IMPLANTED PORT A CATH PLACEMENT WITH ULTRASOUND AND FLUOROSCOPIC GUIDANCE COMPARISON:  None. MEDICATIONS: Ancef 2 gm IV; The antibiotic was administered within an appropriate time interval prior to skin puncture. ANESTHESIA/SEDATION: Moderate (conscious) sedation was employed during this procedure. A total of Versed 1 mg and Fentanyl 50 mcg was administered intravenously. Moderate Sedation Time: 21 minutes. The patient's level of consciousness and vital signs were monitored continuously by radiology nursing throughout the procedure under my direct supervision. CONTRAST:  None FLUOROSCOPY TIME:  0 minutes, 30 seconds (2 mGy) COMPLICATIONS: None immediate. PROCEDURE: The procedure, risks, benefits, and alternatives were explained to the patient. Questions regarding the procedure were encouraged and answered. The patient understands and consents to the procedure. The right neck and chest were prepped with chlorhexidine in a sterile fashion, and a sterile drape was applied covering the operative field. Maximum barrier sterile technique with sterile gowns and gloves were used for the procedure. A timeout was performed prior to the initiation of the procedure. Ultrasound was used to examine the jugular vein which was compressible and free of internal echoes. A skin marker was used to demarcate the planned venotomy and port pocket incision sites. Local anesthesia was provided to these sites and the subcutaneous tunnel track with 1% lidocaine with 1:100,000 epinephrine. A small incision was created at the jugular access site and blunt dissection was performed of the subcutaneous tissues. Under  real time ultrasound guidance, the jugular vein was accessed with a 21 ga micropuncture needle and an 0.018" wire was inserted to the superior vena cava. A 5 Fr micopuncture set was then used, through which a 0.035" Rosen wire was passed under fluoroscopic guidance into the inferior vena cava. An 8 Fr dilator was then placed over the wire. A subcutaneous port pocket was then created along the upper chest wall utilizing a combination of sharp and blunt dissection. The pocket was irrigated with sterile saline, packed with gauze, and observed for hemorrhage. A single lumen "ISP" sized power injectable port was chosen for placement. The 8 Fr catheter was tunneled from the port pocket site to the venotomy incision. The port was placed in the pocket. The external catheter was trimmed to appropriate length. The dilator was exchanged for an 8 Fr peel-away sheath under fluoroscopic guidance. The catheter was then placed through the sheath and the sheath was removed. Final catheter positioning was confirmed and documented with a fluoroscopic spot radiograph. The port was accessed with a Huber needle, aspirated, and flushed with heparinized saline. The deep dermal layer of the port pocket incision was closed with interrupted 3-0 Vicryl suture. The skin was opposed with a running subcuticular 4-0 Monocryl suture. Dermabond was then placed over the port pocket and neck incisions. The patient tolerated the procedure well without immediate post procedural complication. FINDINGS: After catheter placement, the tip lies within the cavoatrial  junction the catheter aspirates and flushes normally and is ready for immediate use. IMPRESSION: Successful placement of a power injectable Port-A-Cath via the right internal jugular vein. The catheter is ready for immediate use. 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 No M spike. 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 -Discussed 12/05/2019 Lamda free light chains at 1779.7, K/L light chain ratio at 0.01, M Protein is Not Observed. -The pt has no prohibitive toxicities from continuing  [place] at this time. -Advised pt that after VGR is reached we would consider a transplant referral due to her age and higher-risk genetics.  -Advised pt that Revlimid mildly increases the risk of blood clots. Recommended that the pt hydrate well, stay active, and take a daily baby ASA.  -Advised pt that in the case of Multiple Myeloma radiation therapy is used for localized disease. -Advised pt that Multiple Myeloma, as well as treatment can impair her immune system. -Continue Zometa q4weeks  -Will see back in 3 weeks with C2D1    FOLLOW UP: Plz schedule cycle 2 of treatment as per orders (6 treatment days) Port flush and labs on days 1 8 and 15. MD visit in 3 weeks on cycle 2-day 1 Continue Zometa every 4 weeks please schedule next 4 treatments   The total time spent in the appt was 30 minutes and more than 50% was on counseling and direct patient cares, ordering and management of chemotherapy  All of the patient's questions were answered with apparent satisfaction. The patient knows to call the clinic with any problems, questions or concerns.    Sullivan Lone MD [place] AAHIVMS [facility] Hematology/Oncology Physician [facility]  (Office):       [phone] (Work cell):  [phone] (Fax):            [phone]  12/12/2019 1:32 PM  I, Yevette Edwards, am acting as a scribe for Dr. Sullivan Lone.   .I have reviewed the above documentation for accuracy and completeness, and I agree with the above. Brunetta Genera MD

## 2019-12-13 ENCOUNTER — Other Ambulatory Visit: Payer: Self-pay

## 2019-12-13 ENCOUNTER — Other Ambulatory Visit: Payer: Self-pay | Admitting: Nurse Practitioner

## 2019-12-13 ENCOUNTER — Inpatient Hospital Stay: Payer: 59

## 2019-12-13 VITALS — BP 118/72 | HR 66 | Temp 98.2°F | Resp 18

## 2019-12-13 DIAGNOSIS — C9000 Multiple myeloma not having achieved remission: Secondary | ICD-10-CM

## 2019-12-13 DIAGNOSIS — Z5112 Encounter for antineoplastic immunotherapy: Secondary | ICD-10-CM | POA: Diagnosis not present

## 2019-12-13 DIAGNOSIS — C7951 Secondary malignant neoplasm of bone: Secondary | ICD-10-CM

## 2019-12-13 DIAGNOSIS — Z7189 Other specified counseling: Secondary | ICD-10-CM

## 2019-12-13 DIAGNOSIS — I10 Essential (primary) hypertension: Secondary | ICD-10-CM

## 2019-12-13 MED ORDER — DEXTROSE 5 % IV SOLN
36.0000 mg/m2 | Freq: Once | INTRAVENOUS | Status: AC
  Administered 2019-12-13: 70 mg via INTRAVENOUS
  Filled 2019-12-13: qty 5

## 2019-12-13 MED ORDER — SODIUM CHLORIDE 0.9 % IV SOLN
Freq: Once | INTRAVENOUS | Status: DC
  Filled 2019-12-13: qty 250

## 2019-12-13 MED ORDER — PROCHLORPERAZINE MALEATE 10 MG PO TABS
10.0000 mg | ORAL_TABLET | Freq: Once | ORAL | Status: AC
  Administered 2019-12-13: 10 mg via ORAL

## 2019-12-13 MED ORDER — PROCHLORPERAZINE MALEATE 10 MG PO TABS
ORAL_TABLET | ORAL | Status: AC
  Filled 2019-12-13: qty 1

## 2019-12-13 MED ORDER — SODIUM CHLORIDE 0.9% FLUSH
10.0000 mL | INTRAVENOUS | Status: DC | PRN
  Filled 2019-12-13: qty 10

## 2019-12-13 MED ORDER — HEPARIN SOD (PORK) LOCK FLUSH 100 UNIT/ML IV SOLN
500.0000 [IU] | Freq: Once | INTRAVENOUS | Status: DC | PRN
  Filled 2019-12-13: qty 5

## 2019-12-13 MED ORDER — SODIUM CHLORIDE 0.9 % IV SOLN
Freq: Once | INTRAVENOUS | Status: AC
  Filled 2019-12-13: qty 250

## 2019-12-13 NOTE — Patient Instructions (Signed)
[facility] Discharge Instructions for Patients Receiving Chemotherapy  Today you received the following chemotherapy agents: carfilzomib.  To help prevent nausea and vomiting after your treatment, we encourage you to take your nausea medication as directed.   If you develop nausea and vomiting that is not controlled by your nausea medication, call the clinic.   BELOW ARE SYMPTOMS THAT SHOULD BE REPORTED IMMEDIATELY:  *FEVER GREATER THAN 100.5 F  *CHILLS WITH OR WITHOUT FEVER  NAUSEA AND VOMITING THAT IS NOT CONTROLLED WITH YOUR NAUSEA MEDICATION  *UNUSUAL SHORTNESS OF BREATH  *UNUSUAL BRUISING OR BLEEDING  TENDERNESS IN MOUTH AND THROAT WITH OR WITHOUT PRESENCE OF ULCERS  *URINARY PROBLEMS  *BOWEL PROBLEMS  UNUSUAL RASH Items with * indicate a potential emergency and should be followed up as soon as possible.  Feel free to call the clinic should you have any questions or concerns. The clinic phone number is [phone].  Please show the CHEMO ALERT CARD at check-in to the Emergency Department and triage nurse.   

## 2019-12-16 ENCOUNTER — Other Ambulatory Visit: Payer: Self-pay | Admitting: *Deleted

## 2019-12-16 DIAGNOSIS — C7951 Secondary malignant neoplasm of bone: Secondary | ICD-10-CM

## 2019-12-16 DIAGNOSIS — C9000 Multiple myeloma not having achieved remission: Secondary | ICD-10-CM

## 2019-12-19 ENCOUNTER — Inpatient Hospital Stay: Payer: 59

## 2019-12-19 ENCOUNTER — Inpatient Hospital Stay (HOSPITAL_BASED_OUTPATIENT_CLINIC_OR_DEPARTMENT_OTHER): Payer: 59 | Admitting: Medical

## 2019-12-19 ENCOUNTER — Other Ambulatory Visit: Payer: Self-pay

## 2019-12-19 VITALS — BP 133/86 | HR 71 | Temp 98.2°F | Resp 18

## 2019-12-19 DIAGNOSIS — C7951 Secondary malignant neoplasm of bone: Secondary | ICD-10-CM

## 2019-12-19 DIAGNOSIS — Z5112 Encounter for antineoplastic immunotherapy: Secondary | ICD-10-CM | POA: Diagnosis not present

## 2019-12-19 DIAGNOSIS — C9000 Multiple myeloma not having achieved remission: Secondary | ICD-10-CM

## 2019-12-19 DIAGNOSIS — Z7189 Other specified counseling: Secondary | ICD-10-CM

## 2019-12-19 LAB — CMP (CANCER CENTER ONLY)
ALT: 32 U/L (ref 0–44)
AST: 17 U/L (ref 15–41)
Albumin: 3.4 g/dL — ABNORMAL LOW (ref 3.5–5.0)
Alkaline Phosphatase: 69 U/L (ref 38–126)
Anion gap: 8 (ref 5–15)
BUN: 12 mg/dL (ref 6–20)
CO2: 27 mmol/L (ref 22–32)
Calcium: 9 mg/dL (ref 8.9–10.3)
Chloride: 106 mmol/L (ref 98–111)
Creatinine: 0.74 mg/dL (ref 0.44–1.00)
GFR, Estimated: >60 mL/min (ref >60)
Glucose, Bld: 85 mg/dL (ref 70–99)
Potassium: 4.1 mmol/L (ref 3.5–5.1)
Sodium: 141 mmol/L (ref 135–145)
Total Bilirubin: 0.6 mg/dL (ref 0.3–1.2)
Total Protein: 6.3 g/dL — ABNORMAL LOW (ref 6.5–8.1)

## 2019-12-19 LAB — CBC WITH DIFFERENTIAL (CANCER CENTER ONLY)
Abs Immature Granulocytes: 0.03 10*3/uL (ref 0.00–0.07)
Basophils Absolute: 0 10*3/uL (ref 0.0–0.1)
Basophils Relative: 0 %
Eosinophils Absolute: 0.4 10*3/uL (ref 0.0–0.5)
Eosinophils Relative: 6 %
HCT: 31.3 % — ABNORMAL LOW (ref 36.0–46.0)
Hemoglobin: 10.5 g/dL — ABNORMAL LOW (ref 12.0–15.0)
Immature Granulocytes: 0 %
Lymphocytes Relative: 23 %
Lymphs Abs: 1.6 10*3/uL (ref 0.7–4.0)
MCH: 28.9 pg (ref 26.0–34.0)
MCHC: 33.5 g/dL (ref 30.0–36.0)
MCV: 86.2 fL (ref 80.0–100.0)
Monocytes Absolute: 1.1 10*3/uL — ABNORMAL HIGH (ref 0.1–1.0)
Monocytes Relative: 16 %
Neutro Abs: 3.8 10*3/uL (ref 1.7–7.7)
Neutrophils Relative %: 55 %
Platelet Count: 214 10*3/uL (ref 150–400)
RBC: 3.63 MIL/uL — ABNORMAL LOW (ref 3.87–5.11)
RDW: 14.4 % (ref 11.5–15.5)
WBC Count: 7 10*3/uL (ref 4.0–10.5)
nRBC: 0 % (ref 0.0–0.2)

## 2019-12-19 MED ORDER — DEXTROSE 5 % IV SOLN
36.0000 mg/m2 | Freq: Once | INTRAVENOUS | Status: AC
  Administered 2019-12-19: 70 mg via INTRAVENOUS
  Filled 2019-12-19: qty 5

## 2019-12-19 MED ORDER — HEPARIN SOD (PORK) LOCK FLUSH 100 UNIT/ML IV SOLN
500.0000 [IU] | Freq: Once | INTRAVENOUS | Status: AC | PRN
  Administered 2019-12-19: 500 [IU]
  Filled 2019-12-19: qty 5

## 2019-12-19 MED ORDER — SODIUM CHLORIDE 0.9 % IV SOLN
Freq: Once | INTRAVENOUS | Status: AC
  Filled 2019-12-19: qty 250

## 2019-12-19 MED ORDER — SODIUM CHLORIDE 0.9% FLUSH
10.0000 mL | INTRAVENOUS | Status: DC | PRN
  Administered 2019-12-19: 10 mL
  Filled 2019-12-19: qty 10

## 2019-12-19 MED ORDER — SODIUM CHLORIDE 0.9 % IV SOLN
40.0000 mg | Freq: Once | INTRAVENOUS | Status: AC
  Administered 2019-12-19: 40 mg via INTRAVENOUS
  Filled 2019-12-19: qty 4

## 2019-12-19 MED ORDER — SODIUM CHLORIDE 0.9% FLUSH
3.0000 mL | Freq: Once | INTRAVENOUS | Status: AC | PRN
  Administered 2019-12-19: 3 mL
  Filled 2019-12-19: qty 10

## 2019-12-19 MED ORDER — SODIUM CHLORIDE 0.9 % IV SOLN
Freq: Once | INTRAVENOUS | Status: DC
  Filled 2019-12-19: qty 250

## 2019-12-19 NOTE — Patient Instructions (Signed)
[facility] Discharge Instructions for Patients Receiving Chemotherapy  Today you received the following chemotherapy agents:  Kyprolis  To help prevent nausea and vomiting after your treatment, we encourage you to take your nausea medication as directed.   If you develop nausea and vomiting that is not controlled by your nausea medication, call the clinic.   BELOW ARE SYMPTOMS THAT SHOULD BE REPORTED IMMEDIATELY:  *FEVER GREATER THAN 100.5 F  *CHILLS WITH OR WITHOUT FEVER  NAUSEA AND VOMITING THAT IS NOT CONTROLLED WITH YOUR NAUSEA MEDICATION  *UNUSUAL SHORTNESS OF BREATH  *UNUSUAL BRUISING OR BLEEDING  TENDERNESS IN MOUTH AND THROAT WITH OR WITHOUT PRESENCE OF ULCERS  *URINARY PROBLEMS  *BOWEL PROBLEMS  UNUSUAL RASH Items with * indicate a potential emergency and should be followed up as soon as possible.  Feel free to call the clinic should you have any questions or concerns. The clinic phone number is [phone].  Please show the CHEMO ALERT CARD at check-in to the Emergency Department and triage nurse.   

## 2019-12-19 NOTE — Progress Notes (Signed)
Sandi Mealy, PA at bedside in infusion per pt's request.   VSS and pt stable at discharge

## 2019-12-20 ENCOUNTER — Inpatient Hospital Stay: Payer: 59

## 2019-12-20 ENCOUNTER — Other Ambulatory Visit: Payer: Self-pay

## 2019-12-20 VITALS — BP 131/84 | HR 70 | Temp 98.3°F | Resp 16

## 2019-12-20 DIAGNOSIS — C7951 Secondary malignant neoplasm of bone: Secondary | ICD-10-CM

## 2019-12-20 DIAGNOSIS — Z7189 Other specified counseling: Secondary | ICD-10-CM

## 2019-12-20 DIAGNOSIS — C9000 Multiple myeloma not having achieved remission: Secondary | ICD-10-CM

## 2019-12-20 DIAGNOSIS — Z5112 Encounter for antineoplastic immunotherapy: Secondary | ICD-10-CM | POA: Diagnosis not present

## 2019-12-20 MED ORDER — HEPARIN SOD (PORK) LOCK FLUSH 100 UNIT/ML IV SOLN
500.0000 [IU] | Freq: Once | INTRAVENOUS | Status: AC | PRN
  Administered 2019-12-20: 500 [IU]
  Filled 2019-12-20: qty 5

## 2019-12-20 MED ORDER — PROCHLORPERAZINE MALEATE 10 MG PO TABS
ORAL_TABLET | ORAL | Status: AC
  Filled 2019-12-20: qty 1

## 2019-12-20 MED ORDER — DEXTROSE 5 % IV SOLN
36.0000 mg/m2 | Freq: Once | INTRAVENOUS | Status: AC
  Administered 2019-12-20: 70 mg via INTRAVENOUS
  Filled 2019-12-20: qty 30

## 2019-12-20 MED ORDER — SODIUM CHLORIDE 0.9 % IV SOLN
Freq: Once | INTRAVENOUS | Status: DC
  Filled 2019-12-20: qty 250

## 2019-12-20 MED ORDER — SODIUM CHLORIDE 0.9 % IV SOLN
Freq: Once | INTRAVENOUS | Status: AC
  Filled 2019-12-20: qty 250

## 2019-12-20 MED ORDER — PROCHLORPERAZINE MALEATE 10 MG PO TABS
10.0000 mg | ORAL_TABLET | Freq: Once | ORAL | Status: AC
  Administered 2019-12-20: 10 mg via ORAL

## 2019-12-20 MED ORDER — SODIUM CHLORIDE 0.9% FLUSH
10.0000 mL | INTRAVENOUS | Status: DC | PRN
  Administered 2019-12-20: 10 mL
  Filled 2019-12-20: qty 10

## 2019-12-20 NOTE — Progress Notes (Signed)
Alexandra [name] was seen in infusion today as she was receiving chemotherapy.  She reports having a regular heart rate at times.  She reports that she feels that her heart is skipping beats.  Her heart was auscultated and there was noted to be a rhythm that was consistent with an occasional PVC.  At least 1 early beat was noted.  Otherwise her heart sounds showed regular rate and rhythm without murmurs rubs or gallops.  She was told that this could be related to stress, lack of sleep, caffeine, and chocolate to name a precipitating factors.  She expressed understanding.  She was told to continue to watch her episodes of irregular heart beats and to see if they were associated with any activities.  Should these continue she could be referred to cardiology for a Holter monitor or an event monitor.  Sandi Mealy, MHS, PA-C Physician Assistant

## 2019-12-20 NOTE — Progress Notes (Signed)
Pt stable at discharge. Ambulatory to lobby  

## 2019-12-20 NOTE — Patient Instructions (Signed)
[facility] Discharge Instructions for Patients Receiving Chemotherapy  Today you received the following chemotherapy agents:  Kyprolis  To help prevent nausea and vomiting after your treatment, we encourage you to take your nausea medication as directed.   If you develop nausea and vomiting that is not controlled by your nausea medication, call the clinic.   BELOW ARE SYMPTOMS THAT SHOULD BE REPORTED IMMEDIATELY:  *FEVER GREATER THAN 100.5 F  *CHILLS WITH OR WITHOUT FEVER  NAUSEA AND VOMITING THAT IS NOT CONTROLLED WITH YOUR NAUSEA MEDICATION  *UNUSUAL SHORTNESS OF BREATH  *UNUSUAL BRUISING OR BLEEDING  TENDERNESS IN MOUTH AND THROAT WITH OR WITHOUT PRESENCE OF ULCERS  *URINARY PROBLEMS  *BOWEL PROBLEMS  UNUSUAL RASH Items with * indicate a potential emergency and should be followed up as soon as possible.  Feel free to call the clinic should you have any questions or concerns. The clinic phone number is [phone].  Please show the CHEMO ALERT CARD at check-in to the Emergency Department and triage nurse.   

## 2019-12-27 ENCOUNTER — Other Ambulatory Visit: Payer: Self-pay | Admitting: Hematology

## 2019-12-27 DIAGNOSIS — C9000 Multiple myeloma not having achieved remission: Secondary | ICD-10-CM

## 2019-12-27 DIAGNOSIS — Z7189 Other specified counseling: Secondary | ICD-10-CM

## 2019-12-28 ENCOUNTER — Telehealth: Payer: Self-pay | Admitting: *Deleted

## 2019-12-28 NOTE — Telephone Encounter (Signed)
Patient called - states she will see Dr. Irene Limbo on Monday 01/02/20 and forms for Christella Scheuermann can be completed at that time.

## 2019-12-29 ENCOUNTER — Telehealth: Payer: Self-pay | Admitting: *Deleted

## 2019-12-29 ENCOUNTER — Telehealth: Payer: Self-pay | Admitting: Hematology

## 2019-12-29 NOTE — Telephone Encounter (Signed)
Alexandra [name] [phone]) called requesting I "notify collaborative to disregard message regarding return to work date of 01/09/2020.  Short term disability (STD) expires 01/22/2020.  Human resources just notified me, unable to accommodate health restrictions, limitations and treatment schedule.  Presence required in office with four others.  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  Job offer reneged.  Return the form to certify medical necessity of continued need of disabilty.  Believe they automatically send long term disability request.  Financially I need to work and have Pharmacologist.  Being sick on STD at start of company merger saved me financially or I would be unemployed.  My job was to be eliminated being redundant to pre-exsisting jobs of parent company.  I guess you should use last treatment date as return to work date."       Dr. Irene Limbo may have expressed transplant as a possible treatment option.  Very interested if it helps me but I did not donate before starting chemotherapy and I am an only child.  Will need a donor."  This nurse expressed she speak with human resources regarding benefits for clarification of length, renewal of, end date of STD; Long Term disability process, elligibility of accommodations per (ADA) Americans with Disabilitiy Act and [place] Social Security disability.     Collaborative nurse provided call information noted above.  Next scheduled provider F/U, Monday, 01/02/2020.

## 2019-12-29 NOTE — Telephone Encounter (Signed)
Alexandra [name] [phone]).   "I spoke with [place].  STD started October 24, 2019 ends March 25, 2020.    Form still necessary to certify need to continue STD is medically necessary.  Return to work date list end of treatment date of June 14th, 2022.  May need to mention transplant recipient proposal.    Long Term disability form is automatically sent to provider.    May require further information or records."   Shared collaborative aware of initial call  Information.  Expect to discuss on 01/02/2020 provider F/u visit.  Denies need for return call today.

## 2019-12-29 NOTE — Telephone Encounter (Signed)
Called patient regarding voicemail that was left, left a voicemail and informed patient regarding upcoming appointments.

## 2019-12-30 ENCOUNTER — Other Ambulatory Visit: Payer: Self-pay | Admitting: *Deleted

## 2019-12-30 DIAGNOSIS — C9000 Multiple myeloma not having achieved remission: Secondary | ICD-10-CM

## 2020-01-02 ENCOUNTER — Inpatient Hospital Stay: Payer: 59 | Attending: Internal Medicine

## 2020-01-02 ENCOUNTER — Other Ambulatory Visit: Payer: 59

## 2020-01-02 ENCOUNTER — Inpatient Hospital Stay (HOSPITAL_BASED_OUTPATIENT_CLINIC_OR_DEPARTMENT_OTHER): Payer: 59 | Admitting: Hematology

## 2020-01-02 ENCOUNTER — Inpatient Hospital Stay: Payer: 59

## 2020-01-02 ENCOUNTER — Other Ambulatory Visit: Payer: Self-pay

## 2020-01-02 ENCOUNTER — Other Ambulatory Visit: Payer: Self-pay | Admitting: Hematology

## 2020-01-02 VITALS — BP 115/79 | HR 69 | Temp 98.2°F | Resp 18 | Ht 67.0 in | Wt 163.5 lb

## 2020-01-02 DIAGNOSIS — Z5112 Encounter for antineoplastic immunotherapy: Secondary | ICD-10-CM | POA: Diagnosis not present

## 2020-01-02 DIAGNOSIS — C9000 Multiple myeloma not having achieved remission: Secondary | ICD-10-CM | POA: Insufficient documentation

## 2020-01-02 DIAGNOSIS — C7951 Secondary malignant neoplasm of bone: Secondary | ICD-10-CM

## 2020-01-02 DIAGNOSIS — Z7189 Other specified counseling: Secondary | ICD-10-CM

## 2020-01-02 LAB — CMP (CANCER CENTER ONLY)
ALT: 16 U/L (ref 0–44)
AST: 14 U/L — ABNORMAL LOW (ref 15–41)
Albumin: 3.6 g/dL (ref 3.5–5.0)
Alkaline Phosphatase: 71 U/L (ref 38–126)
Anion gap: 5 (ref 5–15)
BUN: 14 mg/dL (ref 6–20)
CO2: 26 mmol/L (ref 22–32)
Calcium: 8.9 mg/dL (ref 8.9–10.3)
Chloride: 111 mmol/L (ref 98–111)
Creatinine: 0.74 mg/dL (ref 0.44–1.00)
GFR, Estimated: >60 mL/min (ref >60)
Glucose, Bld: 84 mg/dL (ref 70–99)
Potassium: 4 mmol/L (ref 3.5–5.1)
Sodium: 142 mmol/L (ref 135–145)
Total Bilirubin: 0.8 mg/dL (ref 0.3–1.2)
Total Protein: 6.4 g/dL — ABNORMAL LOW (ref 6.5–8.1)

## 2020-01-02 LAB — CBC WITH DIFFERENTIAL (CANCER CENTER ONLY)
Abs Immature Granulocytes: 0.01 10*3/uL (ref 0.00–0.07)
Basophils Absolute: 0.1 10*3/uL (ref 0.0–0.1)
Basophils Relative: 1 %
Eosinophils Absolute: 0.4 10*3/uL (ref 0.0–0.5)
Eosinophils Relative: 7 %
HCT: 32.8 % — ABNORMAL LOW (ref 36.0–46.0)
Hemoglobin: 11.1 g/dL — ABNORMAL LOW (ref 12.0–15.0)
Immature Granulocytes: 0 %
Lymphocytes Relative: 29 %
Lymphs Abs: 1.5 10*3/uL (ref 0.7–4.0)
MCH: 29.1 pg (ref 26.0–34.0)
MCHC: 33.8 g/dL (ref 30.0–36.0)
MCV: 86.1 fL (ref 80.0–100.0)
Monocytes Absolute: 0.5 10*3/uL (ref 0.1–1.0)
Monocytes Relative: 10 %
Neutro Abs: 2.7 10*3/uL (ref 1.7–7.7)
Neutrophils Relative %: 53 %
Platelet Count: 365 10*3/uL (ref 150–400)
RBC: 3.81 MIL/uL — ABNORMAL LOW (ref 3.87–5.11)
RDW: 15.3 % (ref 11.5–15.5)
WBC Count: 5.2 10*3/uL (ref 4.0–10.5)
nRBC: 0 % (ref 0.0–0.2)

## 2020-01-02 MED ORDER — ZOLEDRONIC ACID 4 MG/100ML IV SOLN
INTRAVENOUS | Status: AC
  Filled 2020-01-02: qty 100

## 2020-01-02 MED ORDER — DEXTROSE 5 % IV SOLN
36.0000 mg/m2 | Freq: Once | INTRAVENOUS | Status: AC
  Administered 2020-01-02: 70 mg via INTRAVENOUS
  Filled 2020-01-02: qty 5

## 2020-01-02 MED ORDER — SODIUM CHLORIDE 0.9 % IV SOLN
Freq: Once | INTRAVENOUS | Status: DC
  Filled 2020-01-02: qty 250

## 2020-01-02 MED ORDER — SODIUM CHLORIDE 0.9% FLUSH
10.0000 mL | INTRAVENOUS | Status: DC | PRN
  Filled 2020-01-02: qty 10

## 2020-01-02 MED ORDER — SODIUM CHLORIDE 0.9 % IV SOLN
40.0000 mg | Freq: Once | INTRAVENOUS | Status: AC
  Administered 2020-01-02: 40 mg via INTRAVENOUS
  Filled 2020-01-02: qty 4

## 2020-01-02 MED ORDER — HEPARIN SOD (PORK) LOCK FLUSH 100 UNIT/ML IV SOLN
500.0000 [IU] | Freq: Once | INTRAVENOUS | Status: DC | PRN
  Filled 2020-01-02: qty 5

## 2020-01-02 MED ORDER — SODIUM CHLORIDE 0.9% FLUSH
10.0000 mL | Freq: Once | INTRAVENOUS | Status: AC | PRN
  Administered 2020-01-02: 10 mL
  Filled 2020-01-02: qty 10

## 2020-01-02 MED ORDER — ZOLEDRONIC ACID 4 MG/100ML IV SOLN
4.0000 mg | Freq: Once | INTRAVENOUS | Status: AC
  Administered 2020-01-02: 4 mg via INTRAVENOUS

## 2020-01-02 MED ORDER — SODIUM CHLORIDE 0.9 % IV SOLN
Freq: Once | INTRAVENOUS | Status: AC
  Filled 2020-01-02: qty 250

## 2020-01-02 NOTE — Progress Notes (Signed)
HEMATOLOGY/ONCOLOGY CONSULTATION NOTE  Date of Service: 01/02/2020  Patient Care Team: [name], FNP as PCP - General (General Practice)  CHIEF COMPLAINTS/PURPOSE OF CONSULTATION:  Multiple myeloma  HISTORY OF PRESENTING ILLNESS:   Alexandra [name] is a wonderful 54 y.o. female who has been referred to [place] by Dr. 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 The pt reports that she is doing well overall.   The pt reports that she was taking Benazepril-HCTZ for her HTN but was told to discontinue as she was becoming hypotensive while in the hospital. She continues taking Lipitor for her HLD and was previously on Ozempic for her Type 2 Diabetes. She has no significant family history of cancers or blood disorders. Pt has no known medication allergies. Pt had a hammer toe surgery and a tubal ligation. 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   In mid-June she began to notice a growth on the left side of her forehead. Because the lump continued to grow she visited her PCP at the end of June. Her PCP and Dermatologist thought that it was a lipoma initally. She was sent for a consult with Dr. Harlow Mares at [facility] in August. She received her first cranial MRI on 10/05/2019, which revealed a mass and bone erosion. Pt had her initial Craniectomy on 10/11/2019 and a Left Bifrontal Cranioplasty on 10/14/2019. Pt denies any new bone pain, fevers, chills, or night sweats at the time. Pt has lost 40 lbs over the last year, but this was after the addition of Ozempic and improved dietary and exercise habits. She has started regaining weight after holding Ozempic. At this time pt reports mild discomfort at surgical site and no other pertinent symptoms.  Of note prior to the patient's visit today, pt has had Surgical Pathology [phone]) completed on 10/10/2019 with results revealing "A. BRAIN  TUMOR, LEFT FRONTAL, RESECTION: - Plasma cell neoplasm B. BRAIN TUMOR, LEFT FRONTAL, RESECTION: - Plasma cell neoplasm C. BONE, LEFT FRONTAL, RESECTION: - Plasma cell neoplasm."   Pt has had MRI Brain ([id number]) completed on 10/11/2019 with results revealing "No complicating feature or residual tumor seen after left frontal bone mass resection."  Pt has had MRI Brain ([id number]) completed on 10/05/2019 with results revealing "1. Large extra-axial mass in the anterior left frontal region with bone erosion and extension into the scalp soft tissues. Considerations include hemangiopericytoma, metastasis, and aggressive meningioma. 2. 6 mm enhancing lesion in the clivus. This is indeterminate but does raise concern for metastatic disease. 3. Mild chronic small vessel ischemic disease."  Most recent lab results (10/11/2019) of CBC & BMP is as follows: all values are WNL except for WBC at 16.6K, RBC at 2.99, Hgb at 9.1, HCT at 26.5, Glucose at 104, Calcium at 8.4.  On review of systems, pt reports discomfort at surgicalsite and denies balance issues, memory loss, headaches, fevers, chills, unexpected weight loss and any othe symptoms.   On PMHx the pt reports HTN, HLD, Diabetes, Tubal Ligation, Hammer Toe Surgery, Herpes. On Social Hx the pt reports that she previously smoked on and off but is not currently smoking and does not drink much alcohol. On Family Hx the pt reports a grandfather with prostate cancer.  INTERVAL HISTORY: Alexandra [name] is a wonderful 54 y.o. 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 The patient's last visit with [place] was on 12/19/2019.  The pt reports that she is doing well overall.  The pt reports that she still has some swelling and bogginess at the surgical site. She does not have a follow up with them at this time. Pt has continued Aspirin, Valtrex, and B-complex as recommended. She endorses some constipation that was occurring prior  to treatment. It is not very bothersome at this time.   Lab results today (01/02/20) of CBC w/diff and CMP is as follows: all values are WNL except for RBC at 3.81, Hgb at 11.1, HCT at 32.8, Total Protein at 6.4, AST at 14. 01/02/2020 MMP is in progress 01/02/2020 K/L light chains is in progress  On review of systems, pt reports constipation and denies nausea, vomiting, diarrhea, low appetite, unexpected weight loss, fatigue, new bone pain, rash, leg swelling, pain at port site and any other symptoms.   MEDICAL HISTORY:  Past Medical History:  Diagnosis Date  . Anxiety   . Hypertension   . Pre-diabetes     SURGICAL HISTORY: Past Surgical History:  Procedure Laterality Date  . ABLATION    . APPLICATION OF CRANIAL NAVIGATION N/A 10/10/2019   Procedure: APPLICATION OF CRANIAL NAVIGATION;  Surgeon: Consuella Lose, MD;  Location: [place];  Service: Neurosurgery;  Laterality: N/A;  APPLICATION OF CRANIAL NAVIGATION  . CRANIOPLASTY Left 10/14/2019   Procedure: LEFT CRANIOPLASTY WITH PLACEMENT OF CUSTOM CRANIAL IMPLANT;  Surgeon: Consuella Lose, MD;  Location: [place];  Service: Neurosurgery;  Laterality: Left;  . CRANIOTOMY Left 10/10/2019   Procedure: Left frontal Stereotactic craniectomy for resection of tumor;  Surgeon: Consuella Lose, MD;  Location: [place];  Service: Neurosurgery;  Laterality: Left;  Left frontal Stereotactic craniectomy for resection of tumor  . HAMMER TOE SURGERY Left   . IR IMAGING GUIDED PORT INSERTION  12/01/2019  . TUBAL LIGATION      SOCIAL HISTORY: Social History   Socioeconomic History  . Marital status: Single    Spouse name: Not on file  . Number of children: Not on file  . Years of education: Not on file  . Highest education level: Not on file  Occupational History  . Not on file  Tobacco Use  . Smoking status: Never Smoker  . Smokeless tobacco: Never Used  Vaping Use  . Vaping Use: Never used  Substance and Sexual Activity  . Alcohol use:  Yes    Comment: occassionally  . Drug use: Not Currently  . Sexual activity: Not on file  Other Topics Concern  . Not on file  Social History Narrative  . Not on file   Social Determinants of Health   Financial Resource Strain: Not on file  Food Insecurity: Not on file  Transportation Needs: Not on file  Physical Activity: Not on file  Stress: Not on file  Social Connections: Not on file  Intimate Partner Violence: Not on file    FAMILY HISTORY: Family History  Problem Relation Age of Onset  . Breast cancer Maternal Grandmother   . Heart disease Mother   . Hypertension Mother   . Sarcoidosis Father   . Diabetes Maternal Grandfather     ALLERGIES:  has No Known Allergies.  MEDICATIONS:  Current Outpatient Medications  Medication Sig Dispense Refill  . acetaminophen (TYLENOL) 650 MG CR tablet Take 1,300 mg by mouth every 8 (eight) hours as needed for pain.    Marland Kitchen aspirin EC 81 MG tablet Take 1 tablet (81 mg total) by mouth daily. (Patient not taking: Reported on 12/12/2019) 100 tablet  3  . atorvastatin (LIPITOR) 10 MG tablet TAKE 1 TABLET BY MOUTH EVERY DAY (Patient taking differently: Take 10 mg by mouth daily. ) 90 tablet 1  . benazepril-hydrochlorthiazide (LOTENSIN HCT) 20-12.5 MG tablet TAKE 1 TABLET BY MOUTH EVERY DAY 90 tablet 1  . Cholecalciferol (VITAMIN D) 50 MCG (2000 UT) tablet Take 2,000 Units by mouth daily.    . Coenzyme Q10 (COQ-10) 100 MG CAPS Take 200 mg by mouth daily.    Marland Kitchen dexamethasone (DECADRON) 4 MG tablet Take 10 tablets (71m) once on day 22. Repeat every 28 days for the first 4 cycles. Take with breakfast. 40 tablet 4  . Eszopiclone 3 MG TABS Take 1 tablet (3 mg total) by mouth at bedtime. Take immediately before bedtime (Patient taking differently: Take 3 mg by mouth at bedtime as needed (sleep). Take immediately before bedtime) 30 tablet 5  . Insulin Pen Needle (NOVOFINE PLUS) 32G X 4 MM MISC Inject 1 pen into the skin daily. Inject 1 pen with Victoza  SQ 100 each 3  . [place] Panax Ginseng 100 MG CAPS Take 100 mg by mouth daily. (Patient not taking: Reported on 12/12/2019)    . levocetirizine (XYZAL) 5 MG tablet TAKE 1 TABLET BY MOUTH EVERY DAY IN THE EVENING (Patient taking differently: Take 5 mg by mouth every evening. ) 30 tablet 2  . lidocaine-prilocaine (EMLA) cream Apply to affected area once (Patient taking differently: Apply 1 application topically daily as needed (port access). ) 30 g 3  . LORazepam (ATIVAN) 0.5 MG tablet Take 1 tablet (0.5 mg total) by mouth every 6 (six) hours as needed (Nausea or vomiting). 30 tablet 0  . Multiple Vitamin (MULTIVITAMIN WITH MINERALS) TABS tablet Take 1 tablet by mouth daily.    . Multiple Vitamins-Minerals (IMMUNE SUPPORT PO) Take 2 tablets by mouth daily.    . ondansetron (ZOFRAN) 8 MG tablet Take 1 tablet (8 mg total) by mouth 2 (two) times daily as needed (Nausea or vomiting). 30 tablet 1  . oxyCODONE-acetaminophen (PERCOCET) 5-325 MG tablet Take 1 tablet by mouth every 4 (four) hours as needed for severe pain. 30 tablet 0  . Oxymetazoline HCl (AFRIN NODRIP SEVERE CONGEST NA) Place 1 spray into the nose daily as needed (congestion).    .Marland KitchenOZEMPIC, 0.25 OR 0.5 MG/DOSE, 2 MG/1.5ML SOPN INJECT 0.5 MG TOTAL INTO THE SKIN ONCE A WEEK. (Patient taking differently: Inject 0.5 mg as directed every Friday. ) 4.5 mL 1  . prochlorperazine (COMPAZINE) 10 MG tablet Take 1 tablet (10 mg total) by mouth every 6 (six) hours as needed (Nausea or vomiting). 30 tablet 1  . REVLIMID 15 MG capsule TAKE 1 CAPSULE BY MOUTH ONCE DAILY 21 DAYS ON AND 7 DAYS OFF 21 capsule 0  . senna (SENOKOT) 8.6 MG TABS tablet Take 17.2 mg by mouth daily as needed for mild constipation.    . Turmeric Curcumin 500 MG CAPS Take 500 mg by mouth daily.    . valACYclovir (VALTREX) 1000 MG tablet Take 1 tablet (1,000 mg total) by mouth daily. Patient taking it as needed (Patient taking differently: Take 1,000 mg by mouth daily. ) 90 tablet 1  .  vitamin B-12 (CYANOCOBALAMIN) 1000 MCG tablet Take 1,000 mcg by mouth daily.     No current facility-administered medications for this visit.   Facility-Administered Medications Ordered in Other Visits  Medication Dose Route Frequency Provider Last Rate Last Admin  . 0.9 %  sodium chloride infusion   Intravenous Once [place]  Cloria Spring, MD      . carfilzomib (KYPROLIS) 70 mg in dextrose 5 % 100 mL chemo infusion  36 mg/m2 (Treatment Plan Recorded) Intravenous Once Brunetta Genera, MD 270 mL/hr at 01/02/20 1532 70 mg at 01/02/20 1532  . heparin lock flush 100 unit/mL  500 Units Intracatheter Once PRN Brunetta Genera, MD      . sodium chloride flush (NS) 0.9 % injection 10 mL  10 mL Intracatheter PRN Brunetta Genera, MD        REVIEW OF SYSTEMS:   A 10+ POINT REVIEW OF SYSTEMS WAS OBTAINED including neurology, dermatology, psychiatry, cardiac, respiratory, lymph, extremities, GI, GU, Musculoskeletal, constitutional, breasts, reproductive, HEENT.  All pertinent positives are noted in the HPI.  All others are negative.   PHYSICAL EXAMINATION: ECOG PERFORMANCE STATUS: 1 - Symptomatic but completely ambulatory  . Vitals:   01/02/20 1255  BP: 115/79  Pulse: 69  Resp: 18  Temp: 98.2 F (36.8 C)  SpO2: 100%   Filed Weights   01/02/20 1255  Weight: 163 lb 8 oz (74.2 kg)   .Body mass index is 25.61 kg/m.   GENERAL:alert, in no acute distress and comfortable SKIN: no acute rashes, no significant lesions EYES: conjunctiva are pink and non-injected, sclera anicteric OROPHARYNX: MMM, no exudates, no oropharyngeal erythema or ulceration NECK: supple, no JVD LYMPH:  no palpable lymphadenopathy in the cervical, axillary or inguinal regions LUNGS: clear to auscultation b/l with normal respiratory effort HEART: regular rate & rhythm ABDOMEN:  normoactive bowel sounds , non tender, not distended. No palpable hepatosplenomegaly.  Extremity: no pedal edema PSYCH: alert &  oriented x 3 with fluent speech NEURO: no focal motor/sensory deficits  LABORATORY DATA:  I have reviewed the data as listed  . CBC Latest Ref Rng & Units 01/02/2020 12/19/2019 12/12/2019  WBC 4.0 - 10.5 K/uL 5.2 7.0 8.6  Hemoglobin 12.0 - 15.0 g/dL 11.1(L) 10.5(L) 10.8(L)  Hematocrit 36.0 - 46.0 % 32.8(L) 31.3(L) 32.5(L)  Platelets 150 - 400 K/uL 365 214 216    . CMP Latest Ref Rng & Units 01/02/2020 12/19/2019 12/12/2019  Glucose 70 - 99 mg/dL 84 85 85  BUN 6 - 20 mg/dL 14 12 18   Creatinine 0.44 - 1.00 mg/dL 0.74 0.74 0.77  Sodium 135 - 145 mmol/L 142 141 139  Potassium 3.5 - 5.1 mmol/L 4.0 4.1 4.4  Chloride 98 - 111 mmol/L 111 106 106  CO2 22 - 32 mmol/L 26 27 26   Calcium 8.9 - 10.3 mg/dL 8.9 9.0 8.8(L)  Total Protein 6.5 - 8.1 g/dL 6.4(L) 6.3(L) 6.5  Total Bilirubin 0.3 - 1.2 mg/dL 0.8 0.6 0.3  Alkaline Phos 38 - 126 U/L 71 69 63  AST 15 - 41 U/L 14(L) 17 23  ALT 0 - 44 U/L 16 32 18   11/08/2019 FISH Plasma Cell Myeloma Prognostic Panel:    11/08/2019 Cytogenetics:    11/08/2019 Bone Marrow Report [phone]):   10/10/2019 Surgical Pathology [phone]):   RADIOGRAPHIC STUDIES:  I have personally reviewed the radiological images as listed and agreed with the findings in the report. No results found.  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 No M spike. 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 MMP & K/L light chains are in progress. -The pt has no prohibitive toxicities from continuing [place] at this time. -The pt has no prohibitive toxicities from continuing 15 mg  Revlimid 3 weeks on/1 week off.  -Advised pt that if her blood counts remain stable during this cycle we would increase Revlimid to 25 mg.  -Advised pt that we have gradually increased the dose of Carflizomib and we plan to continue treatment at 36 mg/m^2.  -Recommended that the pt continue to eat well, drink at least 48-64 oz of water each day, and walk 20-30 minutes each day.  -Will give pt contact information for Edwyna Shell, our Education officer, museum. -Continue daily baby ASA, Valtrex, & B complex. -Continue Zometa q4weeks  -Will see back in 4 weeks with labs    FOLLOW UP: Plz schedule C2D15 and C2D16 as per orders Labs on D1,8 and 15 of each cycle of treatment Plz schedule C3 of Carfilzomib (all 6 doses) Labs on D1,8 and 15 of each cycle of treatment Next MD visit in 4 weeks on C3D1 of treatment Continue Zometa every 4 week- plz schedule next 4 treatments    The total time spent in the appt was 30 minutes and more than 50% was on counseling and direct patient cares, ordering and management of chemotherapy  All of the patient's questions were answered with apparent satisfaction. The patient knows to call the clinic with any problems, questions or concerns.    Sullivan Lone MD [place] AAHIVMS [facility] Hematology/Oncology Physician [facility]  (Office):       [phone] (Work cell):  [phone] (Fax):           [phone]  01/02/2020 3:35 PM  I, Yevette Edwards, am acting as a scribe for Dr. Sullivan Lone.   .I have reviewed the above documentation for accuracy and completeness, and I agree with the above. Brunetta Genera MD

## 2020-01-02 NOTE — Progress Notes (Signed)
Pt discharged in no apparent distress. Pt left ambulatory without assistance. Pt aware of discharge instructions and verbalized understanding and had no further questions.  

## 2020-01-02 NOTE — Patient Instructions (Signed)
[facility] Discharge Instructions for Patients Receiving Chemotherapy  Today you received the following chemotherapy agents Kyprolis  To help prevent nausea and vomiting after your treatment, we encourage you to take your nausea medication as directed.    If you develop nausea and vomiting that is not controlled by your nausea medication, call the clinic.   BELOW ARE SYMPTOMS THAT SHOULD BE REPORTED IMMEDIATELY:  *FEVER GREATER THAN 100.5 F  *CHILLS WITH OR WITHOUT FEVER  NAUSEA AND VOMITING THAT IS NOT CONTROLLED WITH YOUR NAUSEA MEDICATION  *UNUSUAL SHORTNESS OF BREATH  *UNUSUAL BRUISING OR BLEEDING  TENDERNESS IN MOUTH AND THROAT WITH OR WITHOUT PRESENCE OF ULCERS  *URINARY PROBLEMS  *BOWEL PROBLEMS  UNUSUAL RASH Items with * indicate a potential emergency and should be followed up as soon as possible.  Feel free to call the clinic should you have any questions or concerns. The clinic phone number is [phone].  Please show the [place] at check-in to the Emergency Department and triage nurse.  Zoledronic Acid injection (Hypercalcemia, Oncology) What is this medicine? ZOLEDRONIC ACID (ZOE le dron ik AS id) lowers the amount of calcium loss from bone. It is used to treat too much calcium in your blood from cancer. It is also used to prevent complications of cancer that has spread to the bone. This medicine may be used for other purposes; ask your health care provider or pharmacist if you have questions. COMMON BRAND NAME(S): Zometa What should I tell my health care provider before I take this medicine? They need to know if you have any of these conditions:  aspirin-sensitive asthma  cancer, especially if you are receiving medicines used to treat cancer  dental disease or wear dentures  infection  kidney disease  receiving corticosteroids like dexamethasone or prednisone  an unusual or allergic reaction to zoledronic acid, other  medicines, foods, dyes, or preservatives  pregnant or trying to get pregnant  breast-feeding How should I use this medicine? This medicine is for infusion into a vein. It is given by a health care professional in a hospital or clinic setting. Talk to your pediatrician regarding the use of this medicine in children. Special care may be needed. Overdosage: If you think you have taken too much of this medicine contact a poison control center or emergency room at once. NOTE: This medicine is only for you. Do not share this medicine with others. What if I miss a dose? It is important not to miss your dose. Call your doctor or health care professional if you are unable to keep an appointment. What may interact with this medicine?  certain antibiotics given by injection  NSAIDs, medicines for pain and inflammation, like ibuprofen or naproxen  some diuretics like bumetanide, furosemide  teriparatide  thalidomide This list may not describe all possible interactions. Give your health care provider a list of all the medicines, herbs, non-prescription drugs, or dietary supplements you use. Also tell them if you smoke, drink alcohol, or use illegal drugs. Some items may interact with your medicine. What should I watch for while using this medicine? Visit your doctor or health care professional for regular checkups. It may be some time before you see the benefit from this medicine. Do not stop taking your medicine unless your doctor tells you to. Your doctor may order blood tests or other tests to see how you are doing. Women should inform their doctor if they wish to become pregnant or think they might be  pregnant. There is a potential for serious side effects to an unborn child. Talk to your health care professional or pharmacist for more information. You should make sure that you get enough calcium and vitamin D while you are taking this medicine. Discuss the foods you eat and the vitamins you take  with your health care professional. Some people who take this medicine have severe bone, joint, and/or muscle pain. This medicine may also increase your risk for jaw problems or a broken thigh bone. Tell your doctor right away if you have severe pain in your jaw, bones, joints, or muscles. Tell your doctor if you have any pain that does not go away or that gets worse. Tell your dentist and dental surgeon that you are taking this medicine. You should not have major dental surgery while on this medicine. See your dentist to have a dental exam and fix any dental problems before starting this medicine. Take good care of your teeth while on this medicine. Make sure you see your dentist for regular follow-up appointments. What side effects may I notice from receiving this medicine? Side effects that you should report to your doctor or health care professional as soon as possible:  allergic reactions like skin rash, itching or hives, swelling of the face, lips, or tongue  anxiety, confusion, or depression  breathing problems  changes in vision  eye pain  feeling faint or lightheaded, falls  jaw pain, especially after dental work  mouth sores  muscle cramps, stiffness, or weakness  redness, blistering, peeling or loosening of the skin, including inside the mouth  trouble passing urine or change in the amount of urine Side effects that usually do not require medical attention (report to your doctor or health care professional if they continue or are bothersome):  bone, joint, or muscle pain  constipation  diarrhea  fever  hair loss  irritation at site where injected  loss of appetite  nausea, vomiting  stomach upset  trouble sleeping  trouble swallowing  weak or tired This list may not describe all possible side effects. Call your doctor for medical advice about side effects. You may report side effects to FDA at 1-800-FDA-1088. Where should I keep my medicine? This drug  is given in a hospital or clinic and will not be stored at home. NOTE: This sheet is a summary. It may not cover all possible information. If you have questions about this medicine, talk to your doctor, pharmacist, or health care provider.  2020 Elsevier/Gold Standard (2013-06-04 14:19:39)

## 2020-01-03 ENCOUNTER — Inpatient Hospital Stay: Payer: 59

## 2020-01-03 ENCOUNTER — Other Ambulatory Visit: Payer: Self-pay

## 2020-01-03 VITALS — BP 121/77 | HR 66 | Temp 98.7°F | Resp 18

## 2020-01-03 DIAGNOSIS — C9000 Multiple myeloma not having achieved remission: Secondary | ICD-10-CM

## 2020-01-03 DIAGNOSIS — Z5112 Encounter for antineoplastic immunotherapy: Secondary | ICD-10-CM | POA: Diagnosis not present

## 2020-01-03 DIAGNOSIS — C7951 Secondary malignant neoplasm of bone: Secondary | ICD-10-CM

## 2020-01-03 DIAGNOSIS — Z7189 Other specified counseling: Secondary | ICD-10-CM

## 2020-01-03 LAB — KAPPA/LAMBDA LIGHT CHAINS
Kappa free light chain: 14.5 mg/L (ref 3.3–19.4)
Kappa, lambda light chain ratio: 0.16 — ABNORMAL LOW (ref 0.26–1.65)
Lambda free light chains: 89.2 mg/L — ABNORMAL HIGH (ref 5.7–26.3)

## 2020-01-03 MED ORDER — HEPARIN SOD (PORK) LOCK FLUSH 100 UNIT/ML IV SOLN
500.0000 [IU] | Freq: Once | INTRAVENOUS | Status: AC | PRN
  Administered 2020-01-03: 500 [IU]
  Filled 2020-01-03: qty 5

## 2020-01-03 MED ORDER — PROCHLORPERAZINE MALEATE 10 MG PO TABS
ORAL_TABLET | ORAL | Status: AC
  Filled 2020-01-03: qty 1

## 2020-01-03 MED ORDER — PROCHLORPERAZINE MALEATE 10 MG PO TABS
10.0000 mg | ORAL_TABLET | Freq: Once | ORAL | Status: AC
  Administered 2020-01-03: 10 mg via ORAL

## 2020-01-03 MED ORDER — DEXTROSE 5 % IV SOLN
36.0000 mg/m2 | Freq: Once | INTRAVENOUS | Status: AC
  Administered 2020-01-03: 70 mg via INTRAVENOUS
  Filled 2020-01-03: qty 30

## 2020-01-03 MED ORDER — SODIUM CHLORIDE 0.9% FLUSH
10.0000 mL | INTRAVENOUS | Status: DC | PRN
  Administered 2020-01-03: 10 mL
  Filled 2020-01-03: qty 10

## 2020-01-03 MED ORDER — SODIUM CHLORIDE 0.9 % IV SOLN
Freq: Once | INTRAVENOUS | Status: AC
  Filled 2020-01-03: qty 250

## 2020-01-03 NOTE — Patient Instructions (Signed)
[facility] Discharge Instructions for Patients Receiving Chemotherapy  Today you received the following chemotherapy agents Kyprolis  To help prevent nausea and vomiting after your treatment, we encourage you to take your nausea medication as directed.    If you develop nausea and vomiting that is not controlled by your nausea medication, call the clinic.   BELOW ARE SYMPTOMS THAT SHOULD BE REPORTED IMMEDIATELY:  *FEVER GREATER THAN 100.5 F  *CHILLS WITH OR WITHOUT FEVER  NAUSEA AND VOMITING THAT IS NOT CONTROLLED WITH YOUR NAUSEA MEDICATION  *UNUSUAL SHORTNESS OF BREATH  *UNUSUAL BRUISING OR BLEEDING  TENDERNESS IN MOUTH AND THROAT WITH OR WITHOUT PRESENCE OF ULCERS  *URINARY PROBLEMS  *BOWEL PROBLEMS  UNUSUAL RASH Items with * indicate a potential emergency and should be followed up as soon as possible.  Feel free to call the clinic should you have any questions or concerns. The clinic phone number is [phone].  Please show the [place] at check-in to the Emergency Department and triage nurse.  Zoledronic Acid injection (Hypercalcemia, Oncology) What is this medicine? ZOLEDRONIC ACID (ZOE le dron ik AS id) lowers the amount of calcium loss from bone. It is used to treat too much calcium in your blood from cancer. It is also used to prevent complications of cancer that has spread to the bone. This medicine may be used for other purposes; ask your health care provider or pharmacist if you have questions. COMMON BRAND NAME(S): Zometa What should I tell my health care provider before I take this medicine? They need to know if you have any of these conditions:  aspirin-sensitive asthma  cancer, especially if you are receiving medicines used to treat cancer  dental disease or wear dentures  infection  kidney disease  receiving corticosteroids like dexamethasone or prednisone  an unusual or allergic reaction to zoledronic acid, other  medicines, foods, dyes, or preservatives  pregnant or trying to get pregnant  breast-feeding How should I use this medicine? This medicine is for infusion into a vein. It is given by a health care professional in a hospital or clinic setting. Talk to your pediatrician regarding the use of this medicine in children. Special care may be needed. Overdosage: If you think you have taken too much of this medicine contact a poison control center or emergency room at once. NOTE: This medicine is only for you. Do not share this medicine with others. What if I miss a dose? It is important not to miss your dose. Call your doctor or health care professional if you are unable to keep an appointment. What may interact with this medicine?  certain antibiotics given by injection  NSAIDs, medicines for pain and inflammation, like ibuprofen or naproxen  some diuretics like bumetanide, furosemide  teriparatide  thalidomide This list may not describe all possible interactions. Give your health care provider a list of all the medicines, herbs, non-prescription drugs, or dietary supplements you use. Also tell them if you smoke, drink alcohol, or use illegal drugs. Some items may interact with your medicine. What should I watch for while using this medicine? Visit your doctor or health care professional for regular checkups. It may be some time before you see the benefit from this medicine. Do not stop taking your medicine unless your doctor tells you to. Your doctor may order blood tests or other tests to see how you are doing. Women should inform their doctor if they wish to become pregnant or think they might be  pregnant. There is a potential for serious side effects to an unborn child. Talk to your health care professional or pharmacist for more information. You should make sure that you get enough calcium and vitamin D while you are taking this medicine. Discuss the foods you eat and the vitamins you take  with your health care professional. Some people who take this medicine have severe bone, joint, and/or muscle pain. This medicine may also increase your risk for jaw problems or a broken thigh bone. Tell your doctor right away if you have severe pain in your jaw, bones, joints, or muscles. Tell your doctor if you have any pain that does not go away or that gets worse. Tell your dentist and dental surgeon that you are taking this medicine. You should not have major dental surgery while on this medicine. See your dentist to have a dental exam and fix any dental problems before starting this medicine. Take good care of your teeth while on this medicine. Make sure you see your dentist for regular follow-up appointments. What side effects may I notice from receiving this medicine? Side effects that you should report to your doctor or health care professional as soon as possible:  allergic reactions like skin rash, itching or hives, swelling of the face, lips, or tongue  anxiety, confusion, or depression  breathing problems  changes in vision  eye pain  feeling faint or lightheaded, falls  jaw pain, especially after dental work  mouth sores  muscle cramps, stiffness, or weakness  redness, blistering, peeling or loosening of the skin, including inside the mouth  trouble passing urine or change in the amount of urine Side effects that usually do not require medical attention (report to your doctor or health care professional if they continue or are bothersome):  bone, joint, or muscle pain  constipation  diarrhea  fever  hair loss  irritation at site where injected  loss of appetite  nausea, vomiting  stomach upset  trouble sleeping  trouble swallowing  weak or tired This list may not describe all possible side effects. Call your doctor for medical advice about side effects. You may report side effects to FDA at 1-800-FDA-1088. Where should I keep my medicine? This drug  is given in a hospital or clinic and will not be stored at home. NOTE: This sheet is a summary. It may not cover all possible information. If you have questions about this medicine, talk to your doctor, pharmacist, or health care provider.  2020 Elsevier/Gold Standard (2013-06-04 14:19:39)

## 2020-01-04 LAB — MULTIPLE MYELOMA PANEL, SERUM
Albumin SerPl Elph-Mcnc: 3.4 g/dL (ref 2.9–4.4)
Albumin/Glob SerPl: 1.4 (ref 0.7–1.7)
Alpha 1: 0.2 g/dL (ref 0.0–0.4)
Alpha2 Glob SerPl Elph-Mcnc: 0.7 g/dL (ref 0.4–1.0)
B-Globulin SerPl Elph-Mcnc: 0.9 g/dL (ref 0.7–1.3)
Gamma Glob SerPl Elph-Mcnc: 0.7 g/dL (ref 0.4–1.8)
Globulin, Total: 2.5 g/dL (ref 2.2–3.9)
IgA: 61 mg/dL — ABNORMAL LOW (ref 87–352)
IgG (Immunoglobin G), Serum: 610 mg/dL (ref 586–1602)
IgM (Immunoglobulin M), Srm: 29 mg/dL (ref 26–217)
Total Protein ELP: 5.9 g/dL — ABNORMAL LOW (ref 6.0–8.5)

## 2020-01-08 MED ORDER — LENALIDOMIDE 25 MG PO CAPS: 25.0000 mg | ORAL_CAPSULE | Freq: Every day | ORAL | 2 refills | Status: DC

## 2020-01-09 ENCOUNTER — Telehealth: Payer: Self-pay | Admitting: *Deleted

## 2020-01-09 ENCOUNTER — Inpatient Hospital Stay: Payer: 59

## 2020-01-09 ENCOUNTER — Other Ambulatory Visit: Payer: Self-pay

## 2020-01-09 VITALS — BP 131/81 | HR 64 | Temp 98.3°F | Resp 18

## 2020-01-09 DIAGNOSIS — C7951 Secondary malignant neoplasm of bone: Secondary | ICD-10-CM

## 2020-01-09 DIAGNOSIS — C9000 Multiple myeloma not having achieved remission: Secondary | ICD-10-CM

## 2020-01-09 DIAGNOSIS — Z7189 Other specified counseling: Secondary | ICD-10-CM

## 2020-01-09 DIAGNOSIS — Z5112 Encounter for antineoplastic immunotherapy: Secondary | ICD-10-CM | POA: Diagnosis not present

## 2020-01-09 LAB — CBC WITH DIFFERENTIAL/PLATELET
Abs Immature Granulocytes: 0.04 10*3/uL (ref 0.00–0.07)
Basophils Absolute: 0.1 10*3/uL (ref 0.0–0.1)
Basophils Relative: 1 %
Eosinophils Absolute: 0.6 10*3/uL — ABNORMAL HIGH (ref 0.0–0.5)
Eosinophils Relative: 9 %
HCT: 31.1 % — ABNORMAL LOW (ref 36.0–46.0)
Hemoglobin: 10.6 g/dL — ABNORMAL LOW (ref 12.0–15.0)
Immature Granulocytes: 1 %
Lymphocytes Relative: 23 %
Lymphs Abs: 1.5 10*3/uL (ref 0.7–4.0)
MCH: 29.4 pg (ref 26.0–34.0)
MCHC: 34.1 g/dL (ref 30.0–36.0)
MCV: 86.4 fL (ref 80.0–100.0)
Monocytes Absolute: 0.7 10*3/uL (ref 0.1–1.0)
Monocytes Relative: 11 %
Neutro Abs: 3.6 10*3/uL (ref 1.7–7.7)
Neutrophils Relative %: 55 %
Platelets: 230 10*3/uL (ref 150–400)
RBC: 3.6 MIL/uL — ABNORMAL LOW (ref 3.87–5.11)
RDW: 15.8 % — ABNORMAL HIGH (ref 11.5–15.5)
WBC: 6.5 10*3/uL (ref 4.0–10.5)
nRBC: 0 % (ref 0.0–0.2)

## 2020-01-09 LAB — CMP (CANCER CENTER ONLY)
ALT: 16 U/L (ref 0–44)
AST: 11 U/L — ABNORMAL LOW (ref 15–41)
Albumin: 3.6 g/dL (ref 3.5–5.0)
Alkaline Phosphatase: 60 U/L (ref 38–126)
Anion gap: 9 (ref 5–15)
BUN: 14 mg/dL (ref 6–20)
CO2: 25 mmol/L (ref 22–32)
Calcium: 8.9 mg/dL (ref 8.9–10.3)
Chloride: 108 mmol/L (ref 98–111)
Creatinine: 0.78 mg/dL (ref 0.44–1.00)
GFR, Estimated: >60 mL/min (ref >60)
Glucose, Bld: 82 mg/dL (ref 70–99)
Potassium: 4.1 mmol/L (ref 3.5–5.1)
Sodium: 142 mmol/L (ref 135–145)
Total Bilirubin: 0.6 mg/dL (ref 0.3–1.2)
Total Protein: 6.1 g/dL — ABNORMAL LOW (ref 6.5–8.1)

## 2020-01-09 MED ORDER — HEPARIN SOD (PORK) LOCK FLUSH 100 UNIT/ML IV SOLN
500.0000 [IU] | Freq: Once | INTRAVENOUS | Status: AC | PRN
Start: 2020-01-09 — End: 2020-01-09
  Administered 2020-01-09: 500 [IU]
  Filled 2020-01-09: qty 5

## 2020-01-09 MED ORDER — SODIUM CHLORIDE 0.9 % IV SOLN
40.0000 mg | Freq: Once | INTRAVENOUS | Status: AC
  Administered 2020-01-09: 40 mg via INTRAVENOUS
  Filled 2020-01-09: qty 4

## 2020-01-09 MED ORDER — SODIUM CHLORIDE 0.9 % IV SOLN
Freq: Once | INTRAVENOUS | Status: AC
  Filled 2020-01-09: qty 250

## 2020-01-09 MED ORDER — SODIUM CHLORIDE 0.9% FLUSH
10.0000 mL | Freq: Once | INTRAVENOUS | Status: AC | PRN
  Administered 2020-01-09: 10 mL
  Filled 2020-01-09: qty 10

## 2020-01-09 MED ORDER — SODIUM CHLORIDE 0.9% FLUSH
10.0000 mL | INTRAVENOUS | Status: DC | PRN
  Administered 2020-01-09: 10 mL
  Filled 2020-01-09: qty 10

## 2020-01-09 MED ORDER — DEXTROSE 5 % IV SOLN
36.0000 mg/m2 | Freq: Once | INTRAVENOUS | Status: AC
  Administered 2020-01-09: 70 mg via INTRAVENOUS
  Filled 2020-01-09: qty 30

## 2020-01-09 NOTE — Telephone Encounter (Signed)
Voicemail received requesting status of forms.  "I spoke with them this morning.  Have not received form and will need notes especially from 01/02/2020 office visit.  Claim expires 01/22/2019 if no further information received."  Request sent to [facility] and [facility] 01/03/2020.  Form should be on record 01/04/2020 awaiting records.   Status check call to [employer] Medical Records Request Department (formerly [employer]).  Automatic response to allow three days for confirmation of receipt.  Live representative denies records receipt before call lost.      Notified of "telephone issues.  No records or form received we sent to [phone] on 10-28, 11-5 and 11-9.  What is turn around time for forms?   Supplied turnaround time of 7 to ten business days.  Confirmed fax number [phone] to re-send form received 11/18/2019.  Original form patient said she did not need.  Notified 12/27/2019 of need was previously prepared for provider. Requested alternative e-mail to send form as [email or web address].  Confirmed with great difficulty, thick foreign language accent of voice.  Resending form today.

## 2020-01-09 NOTE — Patient Instructions (Signed)
[facility] Discharge Instructions for Patients Receiving Chemotherapy  Today you received the following chemotherapy agents: carfilzomib.  To help prevent nausea and vomiting after your treatment, we encourage you to take your nausea medication as directed.   If you develop nausea and vomiting that is not controlled by your nausea medication, call the clinic.   BELOW ARE SYMPTOMS THAT SHOULD BE REPORTED IMMEDIATELY:  *FEVER GREATER THAN 100.5 F  *CHILLS WITH OR WITHOUT FEVER  NAUSEA AND VOMITING THAT IS NOT CONTROLLED WITH YOUR NAUSEA MEDICATION  *UNUSUAL SHORTNESS OF BREATH  *UNUSUAL BRUISING OR BLEEDING  TENDERNESS IN MOUTH AND THROAT WITH OR WITHOUT PRESENCE OF ULCERS  *URINARY PROBLEMS  *BOWEL PROBLEMS  UNUSUAL RASH Items with * indicate a potential emergency and should be followed up as soon as possible.  Feel free to call the clinic should you have any questions or concerns. The clinic phone number is [phone].  Please show the CHEMO ALERT CARD at check-in to the Emergency Department and triage nurse.   

## 2020-01-09 NOTE — Patient Instructions (Signed)

## 2020-01-10 ENCOUNTER — Other Ambulatory Visit: Payer: Self-pay

## 2020-01-10 ENCOUNTER — Inpatient Hospital Stay (HOSPITAL_BASED_OUTPATIENT_CLINIC_OR_DEPARTMENT_OTHER): Payer: 59

## 2020-01-10 VITALS — BP 128/70 | HR 65 | Temp 98.0°F | Resp 16 | Wt 166.5 lb

## 2020-01-10 DIAGNOSIS — Z5112 Encounter for antineoplastic immunotherapy: Secondary | ICD-10-CM | POA: Diagnosis not present

## 2020-01-10 DIAGNOSIS — Z7189 Other specified counseling: Secondary | ICD-10-CM

## 2020-01-10 DIAGNOSIS — C7951 Secondary malignant neoplasm of bone: Secondary | ICD-10-CM

## 2020-01-10 DIAGNOSIS — C9000 Multiple myeloma not having achieved remission: Secondary | ICD-10-CM

## 2020-01-10 LAB — MULTIPLE MYELOMA PANEL, SERUM
Albumin SerPl Elph-Mcnc: 3.3 g/dL (ref 2.9–4.4)
Albumin/Glob SerPl: 1.5 (ref 0.7–1.7)
Alpha 1: 0.2 g/dL (ref 0.0–0.4)
Alpha2 Glob SerPl Elph-Mcnc: 0.6 g/dL (ref 0.4–1.0)
B-Globulin SerPl Elph-Mcnc: 0.8 g/dL (ref 0.7–1.3)
Gamma Glob SerPl Elph-Mcnc: 0.6 g/dL (ref 0.4–1.8)
Globulin, Total: 2.3 g/dL (ref 2.2–3.9)
IgA: 46 mg/dL — ABNORMAL LOW (ref 87–352)
IgG (Immunoglobin G), Serum: 562 mg/dL — ABNORMAL LOW (ref 586–1602)
IgM (Immunoglobulin M), Srm: 28 mg/dL (ref 26–217)
Total Protein ELP: 5.6 g/dL — ABNORMAL LOW (ref 6.0–8.5)

## 2020-01-10 LAB — KAPPA/LAMBDA LIGHT CHAINS
Kappa free light chain: 11.7 mg/L (ref 3.3–19.4)
Kappa, lambda light chain ratio: 0.27 (ref 0.26–1.65)
Lambda free light chains: 42.6 mg/L — ABNORMAL HIGH (ref 5.7–26.3)

## 2020-01-10 MED ORDER — SODIUM CHLORIDE 0.9 % IV SOLN
Freq: Once | INTRAVENOUS | Status: AC
  Filled 2020-01-10: qty 250

## 2020-01-10 MED ORDER — SODIUM CHLORIDE 0.9% FLUSH
10.0000 mL | INTRAVENOUS | Status: DC | PRN
  Administered 2020-01-10: 10 mL
  Filled 2020-01-10: qty 10

## 2020-01-10 MED ORDER — DEXTROSE 5 % IV SOLN
36.0000 mg/m2 | Freq: Once | INTRAVENOUS | Status: AC
  Administered 2020-01-10: 70 mg via INTRAVENOUS
  Filled 2020-01-10: qty 5

## 2020-01-10 MED ORDER — PROCHLORPERAZINE MALEATE 10 MG PO TABS
ORAL_TABLET | ORAL | Status: AC
  Filled 2020-01-10: qty 1

## 2020-01-10 MED ORDER — PROCHLORPERAZINE MALEATE 10 MG PO TABS
10.0000 mg | ORAL_TABLET | Freq: Once | ORAL | Status: AC
  Administered 2020-01-10: 10 mg via ORAL

## 2020-01-10 MED ORDER — HEPARIN SOD (PORK) LOCK FLUSH 100 UNIT/ML IV SOLN
500.0000 [IU] | Freq: Once | INTRAVENOUS | Status: AC | PRN
  Administered 2020-01-10: 500 [IU]
  Filled 2020-01-10: qty 5

## 2020-01-10 NOTE — Progress Notes (Signed)
Pt. stable for discharge. Left via ambulation, no respiratory distress noted. 

## 2020-01-10 NOTE — Patient Instructions (Signed)
[facility] Discharge Instructions for Patients Receiving Chemotherapy  Today you received the following chemotherapy agent: Kyprolis (Carfilzomib)  To help prevent nausea and vomiting after your treatment, we encourage you to take your nausea medication as directed by your MD.   If you develop nausea and vomiting that is not controlled by your nausea medication, call the clinic.   BELOW ARE SYMPTOMS THAT SHOULD BE REPORTED IMMEDIATELY:  *FEVER GREATER THAN 100.5 F  *CHILLS WITH OR WITHOUT FEVER  NAUSEA AND VOMITING THAT IS NOT CONTROLLED WITH YOUR NAUSEA MEDICATION  *UNUSUAL SHORTNESS OF BREATH  *UNUSUAL BRUISING OR BLEEDING  TENDERNESS IN MOUTH AND THROAT WITH OR WITHOUT PRESENCE OF ULCERS  *URINARY PROBLEMS  *BOWEL PROBLEMS  UNUSUAL RASH Items with * indicate a potential emergency and should be followed up as soon as possible.  Feel free to call the clinic should you have any questions or concerns. The clinic phone number is [phone].  Please show the [place] at check-in to the Emergency Department and triage nurse.

## 2020-01-17 ENCOUNTER — Other Ambulatory Visit: Payer: Self-pay

## 2020-01-17 ENCOUNTER — Inpatient Hospital Stay: Payer: 59

## 2020-01-17 VITALS — BP 115/77 | HR 59 | Temp 98.7°F | Resp 18

## 2020-01-17 DIAGNOSIS — Z5112 Encounter for antineoplastic immunotherapy: Secondary | ICD-10-CM | POA: Diagnosis not present

## 2020-01-17 DIAGNOSIS — Z7189 Other specified counseling: Secondary | ICD-10-CM

## 2020-01-17 DIAGNOSIS — C9000 Multiple myeloma not having achieved remission: Secondary | ICD-10-CM

## 2020-01-17 DIAGNOSIS — Z95828 Presence of other vascular implants and grafts: Secondary | ICD-10-CM

## 2020-01-17 DIAGNOSIS — C7951 Secondary malignant neoplasm of bone: Secondary | ICD-10-CM

## 2020-01-17 LAB — CBC WITH DIFFERENTIAL/PLATELET
Abs Immature Granulocytes: 0.03 10*3/uL (ref 0.00–0.07)
Basophils Absolute: 0 10*3/uL (ref 0.0–0.1)
Basophils Relative: 1 %
Eosinophils Absolute: 0.8 10*3/uL — ABNORMAL HIGH (ref 0.0–0.5)
Eosinophils Relative: 15 %
HCT: 34 % — ABNORMAL LOW (ref 36.0–46.0)
Hemoglobin: 11.2 g/dL — ABNORMAL LOW (ref 12.0–15.0)
Immature Granulocytes: 1 %
Lymphocytes Relative: 22 %
Lymphs Abs: 1.2 10*3/uL (ref 0.7–4.0)
MCH: 28.6 pg (ref 26.0–34.0)
MCHC: 32.9 g/dL (ref 30.0–36.0)
MCV: 87 fL (ref 80.0–100.0)
Monocytes Absolute: 0.8 10*3/uL (ref 0.1–1.0)
Monocytes Relative: 15 %
Neutro Abs: 2.5 10*3/uL (ref 1.7–7.7)
Neutrophils Relative %: 46 %
Platelets: 267 10*3/uL (ref 150–400)
RBC: 3.91 MIL/uL (ref 3.87–5.11)
RDW: 16.4 % — ABNORMAL HIGH (ref 11.5–15.5)
WBC: 5.3 10*3/uL (ref 4.0–10.5)
nRBC: 0 % (ref 0.0–0.2)

## 2020-01-17 LAB — CMP (CANCER CENTER ONLY)
ALT: 22 U/L (ref 0–44)
AST: 14 U/L — ABNORMAL LOW (ref 15–41)
Albumin: 3.7 g/dL (ref 3.5–5.0)
Alkaline Phosphatase: 56 U/L (ref 38–126)
Anion gap: 3 — ABNORMAL LOW (ref 5–15)
BUN: 13 mg/dL (ref 6–20)
CO2: 27 mmol/L (ref 22–32)
Calcium: 8.5 mg/dL — ABNORMAL LOW (ref 8.9–10.3)
Chloride: 110 mmol/L (ref 98–111)
Creatinine: 0.73 mg/dL (ref 0.44–1.00)
GFR, Estimated: >60 mL/min (ref >60)
Glucose, Bld: 90 mg/dL (ref 70–99)
Potassium: 4.2 mmol/L (ref 3.5–5.1)
Sodium: 140 mmol/L (ref 135–145)
Total Bilirubin: 0.8 mg/dL (ref 0.3–1.2)
Total Protein: 6.2 g/dL — ABNORMAL LOW (ref 6.5–8.1)

## 2020-01-17 MED ORDER — DEXTROSE 5 % IV SOLN
36.0000 mg/m2 | Freq: Once | INTRAVENOUS | Status: AC
  Administered 2020-01-17: 70 mg via INTRAVENOUS
  Filled 2020-01-17: qty 30

## 2020-01-17 MED ORDER — SODIUM CHLORIDE 0.9% FLUSH
10.0000 mL | INTRAVENOUS | Status: DC | PRN
  Administered 2020-01-17: 10 mL
  Filled 2020-01-17: qty 10

## 2020-01-17 MED ORDER — SODIUM CHLORIDE 0.9 % IV SOLN
Freq: Once | INTRAVENOUS | Status: DC
  Filled 2020-01-17: qty 250

## 2020-01-17 MED ORDER — SODIUM CHLORIDE 0.9% FLUSH
10.0000 mL | INTRAVENOUS | Status: DC | PRN
  Administered 2020-01-17: 10 mL via INTRAVENOUS
  Filled 2020-01-17: qty 10

## 2020-01-17 MED ORDER — SODIUM CHLORIDE 0.9 % IV SOLN
Freq: Once | INTRAVENOUS | Status: AC
  Filled 2020-01-17: qty 250

## 2020-01-17 MED ORDER — SODIUM CHLORIDE 0.9 % IV SOLN
40.0000 mg | Freq: Once | INTRAVENOUS | Status: AC
  Administered 2020-01-17: 40 mg via INTRAVENOUS
  Filled 2020-01-17: qty 4

## 2020-01-17 MED ORDER — HEPARIN SOD (PORK) LOCK FLUSH 100 UNIT/ML IV SOLN
500.0000 [IU] | Freq: Once | INTRAVENOUS | Status: AC | PRN
  Administered 2020-01-17: 500 [IU]
  Filled 2020-01-17: qty 5

## 2020-01-17 NOTE — Patient Instructions (Signed)
[facility] Discharge Instructions for Patients Receiving Chemotherapy  Today you received the following chemotherapy agents kyprolis  To help prevent nausea and vomiting after your treatment, we encourage you to take your nausea medication as directed.   If you develop nausea and vomiting that is not controlled by your nausea medication, call the clinic.   BELOW ARE SYMPTOMS THAT SHOULD BE REPORTED IMMEDIATELY:  *FEVER GREATER THAN 100.5 F  *CHILLS WITH OR WITHOUT FEVER  NAUSEA AND VOMITING THAT IS NOT CONTROLLED WITH YOUR NAUSEA MEDICATION  *UNUSUAL SHORTNESS OF BREATH  *UNUSUAL BRUISING OR BLEEDING  TENDERNESS IN MOUTH AND THROAT WITH OR WITHOUT PRESENCE OF ULCERS  *URINARY PROBLEMS  *BOWEL PROBLEMS  UNUSUAL RASH Items with * indicate a potential emergency and should be followed up as soon as possible.  Feel free to call the clinic should you have any questions or concerns. The clinic phone number is [phone].  Please show the CHEMO ALERT CARD at check-in to the Emergency Department and triage nurse.   

## 2020-01-17 NOTE — Patient Instructions (Signed)

## 2020-01-18 ENCOUNTER — Other Ambulatory Visit: Payer: Self-pay

## 2020-01-18 ENCOUNTER — Inpatient Hospital Stay: Payer: 59

## 2020-01-18 VITALS — BP 119/62 | HR 70 | Temp 99.0°F | Resp 18 | Ht 67.0 in | Wt 165.5 lb

## 2020-01-18 DIAGNOSIS — Z7189 Other specified counseling: Secondary | ICD-10-CM

## 2020-01-18 DIAGNOSIS — C7951 Secondary malignant neoplasm of bone: Secondary | ICD-10-CM

## 2020-01-18 DIAGNOSIS — C9000 Multiple myeloma not having achieved remission: Secondary | ICD-10-CM

## 2020-01-18 DIAGNOSIS — Z5112 Encounter for antineoplastic immunotherapy: Secondary | ICD-10-CM | POA: Diagnosis not present

## 2020-01-18 MED ORDER — HEPARIN SOD (PORK) LOCK FLUSH 100 UNIT/ML IV SOLN
500.0000 [IU] | Freq: Once | INTRAVENOUS | Status: AC | PRN
  Administered 2020-01-18: 500 [IU]
  Filled 2020-01-18: qty 5

## 2020-01-18 MED ORDER — PROCHLORPERAZINE MALEATE 10 MG PO TABS
ORAL_TABLET | ORAL | Status: AC
  Filled 2020-01-18: qty 1

## 2020-01-18 MED ORDER — PROCHLORPERAZINE MALEATE 10 MG PO TABS
10.0000 mg | ORAL_TABLET | Freq: Once | ORAL | Status: AC
  Administered 2020-01-18: 10 mg via ORAL

## 2020-01-18 MED ORDER — SODIUM CHLORIDE 0.9 % IV SOLN
Freq: Once | INTRAVENOUS | Status: AC
  Filled 2020-01-18: qty 250

## 2020-01-18 MED ORDER — SODIUM CHLORIDE 0.9% FLUSH
10.0000 mL | INTRAVENOUS | Status: DC | PRN
  Administered 2020-01-18: 10 mL
  Filled 2020-01-18: qty 10

## 2020-01-18 MED ORDER — DEXTROSE 5 % IV SOLN
36.0000 mg/m2 | Freq: Once | INTRAVENOUS | Status: AC
  Administered 2020-01-18: 70 mg via INTRAVENOUS
  Filled 2020-01-18: qty 30

## 2020-01-18 NOTE — Patient Instructions (Addendum)
[facility] Discharge Instructions for Patients Receiving Chemotherapy  Today you received the following chemotherapy agents Carfilzomib (KYPROLIS).  To help prevent nausea and vomiting after your treatment, we encourage you to take your nausea medication as prescribed.  If you develop nausea and vomiting that is not controlled by your nausea medication, call the clinic.   BELOW ARE SYMPTOMS THAT SHOULD BE REPORTED IMMEDIATELY:  *FEVER GREATER THAN 100.5 F  *CHILLS WITH OR WITHOUT FEVER  NAUSEA AND VOMITING THAT IS NOT CONTROLLED WITH YOUR NAUSEA MEDICATION  *UNUSUAL SHORTNESS OF BREATH  *UNUSUAL BRUISING OR BLEEDING  TENDERNESS IN MOUTH AND THROAT WITH OR WITHOUT PRESENCE OF ULCERS  *URINARY PROBLEMS  *BOWEL PROBLEMS  UNUSUAL RASH Items with * indicate a potential emergency and should be followed up as soon as possible.  Feel free to call the clinic should you have any questions or concerns. The clinic phone number is [phone].  Please show the CHEMO ALERT CARD at check-in to the Emergency Department and triage nurse.   

## 2020-01-19 ENCOUNTER — Other Ambulatory Visit: Payer: Self-pay | Admitting: Hematology

## 2020-01-19 DIAGNOSIS — Z7189 Other specified counseling: Secondary | ICD-10-CM

## 2020-01-19 DIAGNOSIS — C9000 Multiple myeloma not having achieved remission: Secondary | ICD-10-CM

## 2020-01-23 ENCOUNTER — Other Ambulatory Visit: Payer: Self-pay | Admitting: Nurse Practitioner

## 2020-01-23 DIAGNOSIS — E782 Mixed hyperlipidemia: Secondary | ICD-10-CM

## 2020-01-24 ENCOUNTER — Telehealth: Payer: Self-pay | Admitting: *Deleted

## 2020-01-24 DIAGNOSIS — Z7189 Other specified counseling: Secondary | ICD-10-CM

## 2020-01-24 DIAGNOSIS — C9000 Multiple myeloma not having achieved remission: Secondary | ICD-10-CM

## 2020-01-24 MED ORDER — LENALIDOMIDE 25 MG PO CAPS: 25.0000 mg | ORAL_CAPSULE | Freq: Every day | ORAL | 2 refills | Status: DC

## 2020-01-24 NOTE — Telephone Encounter (Signed)
Patient called - [facility] did not have new prescription for Revlimid 25 mg as prescribed by Dr. Candise Che on 01/08/20. Obtained new [facility] PXTG#[id number], 01/24/20.  Sent new Rx via escribe to [facility], [place] - [address]

## 2020-01-25 ENCOUNTER — Telehealth: Payer: Self-pay

## 2020-01-25 NOTE — Telephone Encounter (Signed)
TCT patient regarding her Revlimid refill pharmacy of choice: called [facility] and spoke with Riki Rusk provided a verbal order for Revlimid capsule 25 mg, take 1 capsule by mouth daily for 21 days and then off for 7 days. [facility] Auth number provided. Patient stated that she would call them to confirm when medication would be delivered so that she can be available to receive it and if she would not be available, she would have them deliver to the Cancer center for pick up when she comes for treatment.

## 2020-01-26 ENCOUNTER — Telehealth: Payer: Self-pay | Admitting: *Deleted

## 2020-01-26 DIAGNOSIS — C9000 Multiple myeloma not having achieved remission: Secondary | ICD-10-CM

## 2020-01-26 DIAGNOSIS — Z7189 Other specified counseling: Secondary | ICD-10-CM

## 2020-01-26 MED ORDER — LENALIDOMIDE 25 MG PO CAPS: 25.0000 mg | ORAL_CAPSULE | Freq: Every day | ORAL | 0 refills | Status: DC

## 2020-01-26 NOTE — Telephone Encounter (Signed)
Pharmacy has been changed for Revlimid by insurance to [facility] ([facility]).

## 2020-01-29 NOTE — Progress Notes (Signed)
HEMATOLOGY/ONCOLOGY CONSULTATION NOTE  Date of Service: 01/29/2020  Patient Care Team: [name], FNP as PCP - General (General Practice)  CHIEF COMPLAINTS/PURPOSE OF CONSULTATION:  Multiple myeloma  HISTORY OF PRESENTING ILLNESS:   Alexandra [name] is a wonderful 55 y.o. female who has been referred to [place] by Dr. 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 The pt reports that she is doing well overall.   The pt reports that she was taking Benazepril-HCTZ for her HTN but was told to discontinue as she was becoming hypotensive while in the hospital. She continues taking Lipitor for her HLD and was previously on Ozempic for her Type 2 Diabetes. She has no significant family history of cancers or blood disorders. Pt has no known medication allergies. Pt had a hammer toe surgery and a tubal ligation. 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   In mid-June she began to notice a growth on the left side of her forehead. Because the lump continued to grow she visited her PCP at the end of June. Her PCP and Dermatologist thought that it was a lipoma initally. She was sent for a consult with Dr. Harlow Mares at [facility] in August. She received her first cranial MRI on 10/05/2019, which revealed a mass and bone erosion. Pt had her initial Craniectomy on 10/11/2019 and a Left Bifrontal Cranioplasty on 10/14/2019. Pt denies any new bone pain, fevers, chills, or night sweats at the time. Pt has lost 40 lbs over the last year, but this was after the addition of Ozempic and improved dietary and exercise habits. She has started regaining weight after holding Ozempic. At this time pt reports mild discomfort at surgical site and no other pertinent symptoms.  Of note prior to the patient's visit today, pt has had Surgical Pathology [phone]) completed on 10/10/2019 with results revealing "A. BRAIN  TUMOR, LEFT FRONTAL, RESECTION: - Plasma cell neoplasm B. BRAIN TUMOR, LEFT FRONTAL, RESECTION: - Plasma cell neoplasm C. BONE, LEFT FRONTAL, RESECTION: - Plasma cell neoplasm."   Pt has had MRI Brain ([id number]) completed on 10/11/2019 with results revealing "No complicating feature or residual tumor seen after left frontal bone mass resection."  Pt has had MRI Brain ([id number]) completed on 10/05/2019 with results revealing "1. Large extra-axial mass in the anterior left frontal region with bone erosion and extension into the scalp soft tissues. Considerations include hemangiopericytoma, metastasis, and aggressive meningioma. 2. 6 mm enhancing lesion in the clivus. This is indeterminate but does raise concern for metastatic disease. 3. Mild chronic small vessel ischemic disease."  Most recent lab results (10/11/2019) of CBC & BMP is as follows: all values are WNL except for WBC at 16.6K, RBC at 2.99, Hgb at 9.1, HCT at 26.5, Glucose at 104, Calcium at 8.4.  On review of systems, pt reports discomfort at surgicalsite and denies balance issues, memory loss, headaches, fevers, chills, unexpected weight loss and any othe symptoms.   On PMHx the pt reports HTN, HLD, Diabetes, Tubal Ligation, Hammer Toe Surgery, Herpes. On Social Hx the pt reports that she previously smoked on and off but is not currently smoking and does not drink much alcohol. On Family Hx the pt reports a grandfather with prostate cancer.  INTERVAL HISTORY:  Alexandra [name] is a wonderful 55 y.o. 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 The patient's last visit with [place] was on  01/02/2020. The pt reports that she is doing well overall.  The pt reports some intermittent swelling over the left craniotomy site and notes that she has a follow-up to discuss this with her neurosurgeon. No new bone pains. No fevers chills night sweats or unexpected weight loss. She has good p.o. intake. She  notes that her new job rescinded her job offer since she has restrictions about not being able to work around other people to reduce her risk of COVID infection.  Lab results today (01/29/20) of CBC w/diff and CMP reviewed with the patient. Her Lambda serum free light chains have now normalized at 20.8 down from a peak of nearly 1800.  MEDICAL HISTORY:  Past Medical History:  Diagnosis Date  . Anxiety   . Hypertension   . Pre-diabetes     SURGICAL HISTORY: Past Surgical History:  Procedure Laterality Date  . ABLATION    . APPLICATION OF CRANIAL NAVIGATION N/A 10/10/2019   Procedure: APPLICATION OF CRANIAL NAVIGATION;  Surgeon: Consuella Lose, MD;  Location: [place];  Service: Neurosurgery;  Laterality: N/A;  APPLICATION OF CRANIAL NAVIGATION  . CRANIOPLASTY Left 10/14/2019   Procedure: LEFT CRANIOPLASTY WITH PLACEMENT OF CUSTOM CRANIAL IMPLANT;  Surgeon: Consuella Lose, MD;  Location: [place];  Service: Neurosurgery;  Laterality: Left;  . CRANIOTOMY Left 10/10/2019   Procedure: Left frontal Stereotactic craniectomy for resection of tumor;  Surgeon: Consuella Lose, MD;  Location: [place];  Service: Neurosurgery;  Laterality: Left;  Left frontal Stereotactic craniectomy for resection of tumor  . HAMMER TOE SURGERY Left   . IR IMAGING GUIDED PORT INSERTION  12/01/2019  . TUBAL LIGATION      SOCIAL HISTORY: Social History   Socioeconomic History  . Marital status: Single    Spouse name: Not on file  . Number of children: Not on file  . Years of education: Not on file  . Highest education level: Not on file  Occupational History  . Not on file  Tobacco Use  . Smoking status: Never Smoker  . Smokeless tobacco: Never Used  Vaping Use  . Vaping Use: Never used  Substance and Sexual Activity  . Alcohol use: Yes    Comment: occassionally  . Drug use: Not Currently  . Sexual activity: Not on file  Other Topics Concern  . Not on file  Social History Narrative  . Not on file    Social Determinants of Health   Financial Resource Strain: Not on file  Food Insecurity: Not on file  Transportation Needs: Not on file  Physical Activity: Not on file  Stress: Not on file  Social Connections: Not on file  Intimate Partner Violence: Not on file    FAMILY HISTORY: Family History  Problem Relation Age of Onset  . Breast cancer Maternal Grandmother   . Heart disease Mother   . Hypertension Mother   . Sarcoidosis Father   . Diabetes Maternal Grandfather     ALLERGIES:  has No Known Allergies.  MEDICATIONS:  Current Outpatient Medications  Medication Sig Dispense Refill  . acetaminophen (TYLENOL) 650 MG CR tablet Take 1,300 mg by mouth every 8 (eight) hours as needed for pain.    Marland Kitchen aspirin EC 81 MG tablet Take 1 tablet (81 mg total) by mouth daily. (Patient not taking: Reported on 12/12/2019) 100 tablet 3  . atorvastatin (LIPITOR) 10 MG tablet TAKE 1 TABLET BY MOUTH EVERY DAY 90 tablet 1  . benazepril-hydrochlorthiazide (LOTENSIN HCT) 20-12.5 MG tablet TAKE 1 TABLET BY MOUTH  EVERY DAY (Patient not taking: Reported on 01/02/2020) 90 tablet 1  . Cholecalciferol (VITAMIN D) 50 MCG (2000 UT) tablet Take 2,000 Units by mouth daily.    . Coenzyme Q10 (COQ-10) 100 MG CAPS Take 200 mg by mouth daily.    Marland Kitchen dexamethasone (DECADRON) 4 MG tablet Take 10 tablets (85m) once on day 22. Repeat every 28 days for the first 4 cycles. Take with breakfast. 40 tablet 4  . Eszopiclone 3 MG TABS Take 1 tablet (3 mg total) by mouth at bedtime. Take immediately before bedtime (Patient taking differently: Take 3 mg by mouth at bedtime as needed (sleep). Take immediately before bedtime) 30 tablet 5  . Insulin Pen Needle (NOVOFINE PLUS) 32G X 4 MM MISC Inject 1 pen into the skin daily. Inject 1 pen with Victoza SQ 100 each 3  . [place] Panax Ginseng 100 MG CAPS Take 100 mg by mouth daily. (Patient not taking: Reported on 12/12/2019)    . lenalidomide (REVLIMID) 25 MG capsule Take 1 capsule (25  mg total) by mouth daily. 21 days on 7 days off 21 capsule 0  . levocetirizine (XYZAL) 5 MG tablet TAKE 1 TABLET BY MOUTH EVERY DAY IN THE EVENING (Patient taking differently: Take 5 mg by mouth every evening. ) 30 tablet 2  . lidocaine-prilocaine (EMLA) cream Apply to affected area once (Patient taking differently: Apply 1 application topically daily as needed (port access). ) 30 g 3  . LORazepam (ATIVAN) 0.5 MG tablet Take 1 tablet (0.5 mg total) by mouth every 6 (six) hours as needed (Nausea or vomiting). 30 tablet 0  . Multiple Vitamin (MULTIVITAMIN WITH MINERALS) TABS tablet Take 1 tablet by mouth daily.    . Multiple Vitamins-Minerals (IMMUNE SUPPORT PO) Take 2 tablets by mouth daily.    . ondansetron (ZOFRAN) 8 MG tablet Take 1 tablet (8 mg total) by mouth 2 (two) times daily as needed (Nausea or vomiting). 30 tablet 1  . oxyCODONE-acetaminophen (PERCOCET) 5-325 MG tablet Take 1 tablet by mouth every 4 (four) hours as needed for severe pain. 30 tablet 0  . Oxymetazoline HCl (AFRIN NODRIP SEVERE CONGEST NA) Place 1 spray into the nose daily as needed (congestion).    .Marland KitchenOZEMPIC, 0.25 OR 0.5 MG/DOSE, 2 MG/1.5ML SOPN INJECT 0.5 MG TOTAL INTO THE SKIN ONCE A WEEK. (Patient taking differently: Inject 0.5 mg as directed every Friday. ) 4.5 mL 1  . prochlorperazine (COMPAZINE) 10 MG tablet Take 1 tablet (10 mg total) by mouth every 6 (six) hours as needed (Nausea or vomiting). 30 tablet 1  . senna (SENOKOT) 8.6 MG TABS tablet Take 17.2 mg by mouth daily as needed for mild constipation.    . Turmeric Curcumin 500 MG CAPS Take 500 mg by mouth daily.    . valACYclovir (VALTREX) 1000 MG tablet Take 1 tablet (1,000 mg total) by mouth daily. Patient taking it as needed (Patient taking differently: Take 1,000 mg by mouth daily. ) 90 tablet 1  . vitamin B-12 (CYANOCOBALAMIN) 1000 MCG tablet Take 1,000 mcg by mouth daily.     No current facility-administered medications for this visit.    REVIEW OF SYSTEMS:    A 10+ POINT REVIEW OF SYSTEMS WAS OBTAINED including neurology, dermatology, psychiatry, cardiac, respiratory, lymph, extremities, GI, GU, Musculoskeletal, constitutional, breasts, reproductive, HEENT.  All pertinent positives are noted in the HPI.  All others are negative.   PHYSICAL EXAMINATION: ECOG PERFORMANCE STATUS: 1 - Symptomatic but completely ambulatory  . Vitals:   01/30/20  0855  BP: 116/81  Pulse: 66  Resp: 18  Temp: (!) 97.1 F (36.2 C)  SpO2: 100%   Filed Weights   01/30/20 0855  Weight: 164 lb 6.4 oz (74.6 kg)   .Body mass index is 25.75 kg/m.   NAD GENERAL:alert, in no acute distress and comfortable SKIN: no acute rashes, no significant lesions EYES: conjunctiva are pink and non-injected, sclera anicteric OROPHARYNX: MMM, no exudates, no oropharyngeal erythema or ulceration NECK: supple, no JVD LYMPH:  no palpable lymphadenopathy in the cervical, axillary or inguinal regions LUNGS: clear to auscultation b/l with normal respiratory effort HEART: regular rate & rhythm ABDOMEN:  normoactive bowel sounds , non tender, not distended. No palpable hepatosplenomegaly.  Extremity: no pedal edema PSYCH: alert & oriented x 3 with fluent speech NEURO: no focal motor/sensory deficits  LABORATORY DATA:  I have reviewed the data as listed  . CBC Latest Ref Rng & Units 02/06/2020 01/30/2020 01/17/2020  WBC 4.0 - 10.5 K/uL 5.6 3.8(L) 5.3  Hemoglobin 12.0 - 15.0 g/dL 11.3(L) 11.9(L) 11.2(L)  Hematocrit 36.0 - 46.0 % 33.9(L) 35.6(L) 34.0(L)  Platelets 150 - 400 K/uL 179 304 267    . CMP Latest Ref Rng & Units 02/06/2020 01/30/2020 01/17/2020  Glucose 70 - 99 mg/dL 99 87 90  BUN 6 - 20 mg/dL 13 14 13   Creatinine 0.44 - 1.00 mg/dL 0.81 0.74 0.73  Sodium 135 - 145 mmol/L 141 141 140  Potassium 3.5 - 5.1 mmol/L 3.8 4.0 4.2  Chloride 98 - 111 mmol/L 106 108 110  CO2 22 - 32 mmol/L 24 25 27   Calcium 8.9 - 10.3 mg/dL 9.0 8.7(L) 8.5(L)  Total Protein 6.5 - 8.1 g/dL 6.1(L)  6.3(L) 6.2(L)  Total Bilirubin 0.3 - 1.2 mg/dL 0.9 1.0 0.8  Alkaline Phos 38 - 126 U/L 56 49 56  AST 15 - 41 U/L 17 17 14(L)  ALT 0 - 44 U/L 25 16 22    11/08/2019 FISH Plasma Cell Myeloma Prognostic Panel:    11/08/2019 Cytogenetics:    11/08/2019 Bone Marrow Report [id number]):   10/10/2019 Surgical Pathology [phone]):   RADIOGRAPHIC STUDIES:  I have personally reviewed the radiological images as listed and agreed with the findings in the report. No results found.  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 No M spike. 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 Continue Carflizomib at 36 mg/m^2. -The pt has no prohibitive toxicities from continuing 25 mg Revlimid 3 weeks on/1 week off. -Continue daily baby ASA, Valtrex, & B complex. -Continue Zometa q4weeks    FOLLOW UP: Plz schedule remaining C3 and also C4 of Kyprolis per orders Portflush and labs with each treatment MD visit in 4 weeks with C4D1   The total time spent in the appt was 30 minutes and more than 50% was on counseling and direct patient cares.  All of the patient's questions were answered with apparent satisfaction. The patient knows to call the clinic with any problems, questions or concerns.    Sullivan Lone MD [employer] AAHIVMS [facility] Hematology/Oncology Physician [facility]  (Office):       [phone] (Work cell):  [phone] (Fax):           [phone]  01/29/2020 11:52 PM  I, Yevette Edwards, am acting as a scribe for Dr. Sullivan Lone.   .I have reviewed the above documentation for accuracy and completeness,  and I agree with the above. Brunetta Genera MD

## 2020-01-30 ENCOUNTER — Other Ambulatory Visit: Payer: Self-pay

## 2020-01-30 ENCOUNTER — Inpatient Hospital Stay: Payer: 59

## 2020-01-30 ENCOUNTER — Inpatient Hospital Stay: Payer: 59 | Admitting: Hematology

## 2020-01-30 ENCOUNTER — Inpatient Hospital Stay: Payer: 59 | Attending: Internal Medicine

## 2020-01-30 VITALS — BP 116/81 | HR 66 | Temp 97.1°F | Resp 18 | Ht 67.0 in | Wt 164.4 lb

## 2020-01-30 DIAGNOSIS — C7951 Secondary malignant neoplasm of bone: Secondary | ICD-10-CM | POA: Diagnosis not present

## 2020-01-30 DIAGNOSIS — C9000 Multiple myeloma not having achieved remission: Secondary | ICD-10-CM

## 2020-01-30 DIAGNOSIS — Z5112 Encounter for antineoplastic immunotherapy: Secondary | ICD-10-CM | POA: Insufficient documentation

## 2020-01-30 DIAGNOSIS — Z7189 Other specified counseling: Secondary | ICD-10-CM

## 2020-01-30 DIAGNOSIS — Z5111 Encounter for antineoplastic chemotherapy: Secondary | ICD-10-CM

## 2020-01-30 DIAGNOSIS — Z95828 Presence of other vascular implants and grafts: Secondary | ICD-10-CM

## 2020-01-30 LAB — CBC WITH DIFFERENTIAL/PLATELET
Abs Immature Granulocytes: 0.01 10*3/uL (ref 0.00–0.07)
Basophils Absolute: 0.1 10*3/uL (ref 0.0–0.1)
Basophils Relative: 2 %
Eosinophils Absolute: 0.3 10*3/uL (ref 0.0–0.5)
Eosinophils Relative: 8 %
HCT: 35.6 % — ABNORMAL LOW (ref 36.0–46.0)
Hemoglobin: 11.9 g/dL — ABNORMAL LOW (ref 12.0–15.0)
Immature Granulocytes: 0 %
Lymphocytes Relative: 41 %
Lymphs Abs: 1.6 10*3/uL (ref 0.7–4.0)
MCH: 29 pg (ref 26.0–34.0)
MCHC: 33.4 g/dL (ref 30.0–36.0)
MCV: 86.6 fL (ref 80.0–100.0)
Monocytes Absolute: 0.5 10*3/uL (ref 0.1–1.0)
Monocytes Relative: 14 %
Neutro Abs: 1.3 10*3/uL — ABNORMAL LOW (ref 1.7–7.7)
Neutrophils Relative %: 35 %
Platelets: 304 10*3/uL (ref 150–400)
RBC: 4.11 MIL/uL (ref 3.87–5.11)
RDW: 17.1 % — ABNORMAL HIGH (ref 11.5–15.5)
WBC: 3.8 10*3/uL — ABNORMAL LOW (ref 4.0–10.5)
nRBC: 0 % (ref 0.0–0.2)

## 2020-01-30 LAB — CMP (CANCER CENTER ONLY)
ALT: 16 U/L (ref 0–44)
AST: 17 U/L (ref 15–41)
Albumin: 3.6 g/dL (ref 3.5–5.0)
Alkaline Phosphatase: 49 U/L (ref 38–126)
Anion gap: 8 (ref 5–15)
BUN: 14 mg/dL (ref 6–20)
CO2: 25 mmol/L (ref 22–32)
Calcium: 8.7 mg/dL — ABNORMAL LOW (ref 8.9–10.3)
Chloride: 108 mmol/L (ref 98–111)
Creatinine: 0.74 mg/dL (ref 0.44–1.00)
GFR, Estimated: >60 mL/min (ref >60)
Glucose, Bld: 87 mg/dL (ref 70–99)
Potassium: 4 mmol/L (ref 3.5–5.1)
Sodium: 141 mmol/L (ref 135–145)
Total Bilirubin: 1 mg/dL (ref 0.3–1.2)
Total Protein: 6.3 g/dL — ABNORMAL LOW (ref 6.5–8.1)

## 2020-01-30 MED ORDER — SODIUM CHLORIDE 0.9% FLUSH
10.0000 mL | INTRAVENOUS | Status: DC | PRN
  Administered 2020-01-30: 10 mL
  Filled 2020-01-30: qty 10

## 2020-01-30 MED ORDER — DEXTROSE 5 % IV SOLN
36.0000 mg/m2 | Freq: Once | INTRAVENOUS | Status: AC
  Administered 2020-01-30: 70 mg via INTRAVENOUS
  Filled 2020-01-30: qty 5

## 2020-01-30 MED ORDER — SODIUM CHLORIDE 0.9 % IV SOLN
20.0000 mg | Freq: Once | INTRAVENOUS | Status: AC
  Administered 2020-01-30: 20 mg via INTRAVENOUS
  Filled 2020-01-30: qty 20

## 2020-01-30 MED ORDER — SODIUM CHLORIDE 0.9 % IV SOLN
Freq: Once | INTRAVENOUS | Status: AC
  Filled 2020-01-30: qty 250

## 2020-01-30 MED ORDER — ZOLEDRONIC ACID 4 MG/100ML IV SOLN
INTRAVENOUS | Status: AC
  Filled 2020-01-30: qty 100

## 2020-01-30 MED ORDER — SODIUM CHLORIDE 0.9% FLUSH
10.0000 mL | Freq: Once | INTRAVENOUS | Status: AC
  Administered 2020-01-30: 10 mL
  Filled 2020-01-30: qty 10

## 2020-01-30 MED ORDER — ZOLEDRONIC ACID 4 MG/100ML IV SOLN
4.0000 mg | Freq: Once | INTRAVENOUS | Status: AC
  Administered 2020-01-30: 4 mg via INTRAVENOUS

## 2020-01-30 MED ORDER — HEPARIN SOD (PORK) LOCK FLUSH 100 UNIT/ML IV SOLN
500.0000 [IU] | Freq: Once | INTRAVENOUS | Status: AC | PRN
  Administered 2020-01-30: 500 [IU]
  Filled 2020-01-30: qty 5

## 2020-01-30 NOTE — Patient Instructions (Signed)
[facility] Discharge Instructions for Patients Receiving Chemotherapy  Today you received the following chemotherapy agents: carfilzomib.  To help prevent nausea and vomiting after your treatment, we encourage you to take your nausea medication as directed.   If you develop nausea and vomiting that is not controlled by your nausea medication, call the clinic.   BELOW ARE SYMPTOMS THAT SHOULD BE REPORTED IMMEDIATELY:  *FEVER GREATER THAN 100.5 F  *CHILLS WITH OR WITHOUT FEVER  NAUSEA AND VOMITING THAT IS NOT CONTROLLED WITH YOUR NAUSEA MEDICATION  *UNUSUAL SHORTNESS OF BREATH  *UNUSUAL BRUISING OR BLEEDING  TENDERNESS IN MOUTH AND THROAT WITH OR WITHOUT PRESENCE OF ULCERS  *URINARY PROBLEMS  *BOWEL PROBLEMS  UNUSUAL RASH Items with * indicate a potential emergency and should be followed up as soon as possible.  Feel free to call the clinic should you have any questions or concerns. The clinic phone number is [phone].  Please show the CHEMO ALERT CARD at check-in to the Emergency Department and triage nurse.   

## 2020-01-30 NOTE — Progress Notes (Signed)
Per Dr. Irene Limbo: okay to treat with ANC of 1.3

## 2020-01-31 ENCOUNTER — Other Ambulatory Visit: Payer: Self-pay

## 2020-01-31 ENCOUNTER — Inpatient Hospital Stay: Payer: 59

## 2020-01-31 VITALS — BP 136/85 | HR 68 | Temp 98.9°F | Resp 18

## 2020-01-31 DIAGNOSIS — Z7189 Other specified counseling: Secondary | ICD-10-CM

## 2020-01-31 DIAGNOSIS — C9000 Multiple myeloma not having achieved remission: Secondary | ICD-10-CM

## 2020-01-31 DIAGNOSIS — Z5112 Encounter for antineoplastic immunotherapy: Secondary | ICD-10-CM | POA: Diagnosis not present

## 2020-01-31 DIAGNOSIS — C7951 Secondary malignant neoplasm of bone: Secondary | ICD-10-CM

## 2020-01-31 MED ORDER — DEXTROSE 5 % IV SOLN
36.0000 mg/m2 | Freq: Once | INTRAVENOUS | Status: AC
  Administered 2020-01-31: 70 mg via INTRAVENOUS
  Filled 2020-01-31: qty 30

## 2020-01-31 MED ORDER — SODIUM CHLORIDE 0.9 % IV SOLN
Freq: Once | INTRAVENOUS | Status: AC
  Filled 2020-01-31: qty 250

## 2020-01-31 MED ORDER — PROCHLORPERAZINE MALEATE 10 MG PO TABS
10.0000 mg | ORAL_TABLET | Freq: Once | ORAL | Status: AC
  Administered 2020-01-31: 10 mg via ORAL

## 2020-01-31 MED ORDER — SODIUM CHLORIDE 0.9% FLUSH
10.0000 mL | INTRAVENOUS | Status: DC | PRN
  Administered 2020-01-31: 10 mL
  Filled 2020-01-31: qty 10

## 2020-01-31 MED ORDER — HEPARIN SOD (PORK) LOCK FLUSH 100 UNIT/ML IV SOLN
500.0000 [IU] | Freq: Once | INTRAVENOUS | Status: AC | PRN
  Administered 2020-01-31: 500 [IU]
  Filled 2020-01-31: qty 5

## 2020-01-31 MED ORDER — PROCHLORPERAZINE MALEATE 10 MG PO TABS
ORAL_TABLET | ORAL | Status: AC
  Filled 2020-01-31: qty 1

## 2020-01-31 NOTE — Patient Instructions (Signed)
[facility] Discharge Instructions for Patients Receiving Chemotherapy  Today you received the following chemotherapy agents: carfilzomib.  To help prevent nausea and vomiting after your treatment, we encourage you to take your nausea medication as directed.   If you develop nausea and vomiting that is not controlled by your nausea medication, call the clinic.   BELOW ARE SYMPTOMS THAT SHOULD BE REPORTED IMMEDIATELY:  *FEVER GREATER THAN 100.5 F  *CHILLS WITH OR WITHOUT FEVER  NAUSEA AND VOMITING THAT IS NOT CONTROLLED WITH YOUR NAUSEA MEDICATION  *UNUSUAL SHORTNESS OF BREATH  *UNUSUAL BRUISING OR BLEEDING  TENDERNESS IN MOUTH AND THROAT WITH OR WITHOUT PRESENCE OF ULCERS  *URINARY PROBLEMS  *BOWEL PROBLEMS  UNUSUAL RASH Items with * indicate a potential emergency and should be followed up as soon as possible.  Feel free to call the clinic should you have any questions or concerns. The clinic phone number is [phone].  Please show the CHEMO ALERT CARD at check-in to the Emergency Department and triage nurse.   

## 2020-02-06 ENCOUNTER — Other Ambulatory Visit: Payer: Self-pay

## 2020-02-06 ENCOUNTER — Inpatient Hospital Stay: Payer: 59

## 2020-02-06 VITALS — BP 118/80 | HR 64 | Temp 98.4°F | Wt 166.5 lb

## 2020-02-06 DIAGNOSIS — Z7189 Other specified counseling: Secondary | ICD-10-CM

## 2020-02-06 DIAGNOSIS — C7951 Secondary malignant neoplasm of bone: Secondary | ICD-10-CM

## 2020-02-06 DIAGNOSIS — Z95828 Presence of other vascular implants and grafts: Secondary | ICD-10-CM

## 2020-02-06 DIAGNOSIS — Z5112 Encounter for antineoplastic immunotherapy: Secondary | ICD-10-CM | POA: Diagnosis not present

## 2020-02-06 DIAGNOSIS — C9000 Multiple myeloma not having achieved remission: Secondary | ICD-10-CM

## 2020-02-06 LAB — CMP (CANCER CENTER ONLY)
ALT: 25 U/L (ref 0–44)
AST: 17 U/L (ref 15–41)
Albumin: 3.7 g/dL (ref 3.5–5.0)
Alkaline Phosphatase: 56 U/L (ref 38–126)
Anion gap: 11 (ref 5–15)
BUN: 13 mg/dL (ref 6–20)
CO2: 24 mmol/L (ref 22–32)
Calcium: 9 mg/dL (ref 8.9–10.3)
Chloride: 106 mmol/L (ref 98–111)
Creatinine: 0.81 mg/dL (ref 0.44–1.00)
GFR, Estimated: >60 mL/min (ref >60)
Glucose, Bld: 99 mg/dL (ref 70–99)
Potassium: 3.8 mmol/L (ref 3.5–5.1)
Sodium: 141 mmol/L (ref 135–145)
Total Bilirubin: 0.9 mg/dL (ref 0.3–1.2)
Total Protein: 6.1 g/dL — ABNORMAL LOW (ref 6.5–8.1)

## 2020-02-06 LAB — CBC WITH DIFFERENTIAL/PLATELET
Abs Immature Granulocytes: 0.02 10*3/uL (ref 0.00–0.07)
Basophils Absolute: 0 10*3/uL (ref 0.0–0.1)
Basophils Relative: 1 %
Eosinophils Absolute: 0.4 10*3/uL (ref 0.0–0.5)
Eosinophils Relative: 7 %
HCT: 33.9 % — ABNORMAL LOW (ref 36.0–46.0)
Hemoglobin: 11.3 g/dL — ABNORMAL LOW (ref 12.0–15.0)
Immature Granulocytes: 0 %
Lymphocytes Relative: 28 %
Lymphs Abs: 1.6 10*3/uL (ref 0.7–4.0)
MCH: 29.3 pg (ref 26.0–34.0)
MCHC: 33.3 g/dL (ref 30.0–36.0)
MCV: 87.8 fL (ref 80.0–100.0)
Monocytes Absolute: 0.5 10*3/uL (ref 0.1–1.0)
Monocytes Relative: 9 %
Neutro Abs: 3 10*3/uL (ref 1.7–7.7)
Neutrophils Relative %: 55 %
Platelets: 179 10*3/uL (ref 150–400)
RBC: 3.86 MIL/uL — ABNORMAL LOW (ref 3.87–5.11)
RDW: 17.3 % — ABNORMAL HIGH (ref 11.5–15.5)
WBC: 5.6 10*3/uL (ref 4.0–10.5)
nRBC: 0 % (ref 0.0–0.2)

## 2020-02-06 MED ORDER — SODIUM CHLORIDE 0.9% FLUSH
10.0000 mL | INTRAVENOUS | Status: DC | PRN
  Administered 2020-02-06: 10 mL
  Filled 2020-02-06: qty 10

## 2020-02-06 MED ORDER — DEXTROSE 5 % IV SOLN
36.0000 mg/m2 | Freq: Once | INTRAVENOUS | Status: AC
  Administered 2020-02-06: 70 mg via INTRAVENOUS
  Filled 2020-02-06: qty 5

## 2020-02-06 MED ORDER — HEPARIN SOD (PORK) LOCK FLUSH 100 UNIT/ML IV SOLN
500.0000 [IU] | Freq: Once | INTRAVENOUS | Status: AC | PRN
  Administered 2020-02-06: 500 [IU]
  Filled 2020-02-06: qty 5

## 2020-02-06 MED ORDER — SODIUM CHLORIDE 0.9 % IV SOLN
20.0000 mg | Freq: Once | INTRAVENOUS | Status: AC
  Administered 2020-02-06: 20 mg via INTRAVENOUS
  Filled 2020-02-06: qty 20

## 2020-02-06 MED ORDER — SODIUM CHLORIDE 0.9 % IV SOLN
Freq: Once | INTRAVENOUS | Status: AC
Start: 2020-02-06 — End: 2020-02-06
  Filled 2020-02-06: qty 250

## 2020-02-06 MED ORDER — SODIUM CHLORIDE 0.9% FLUSH
10.0000 mL | Freq: Once | INTRAVENOUS | Status: AC
  Administered 2020-02-06: 10 mL
  Filled 2020-02-06: qty 10

## 2020-02-06 NOTE — Patient Instructions (Signed)
[facility] Discharge Instructions for Patients Receiving Chemotherapy  Today you received the following chemotherapy agents Carfilzumab(Kyprolis)  To help prevent nausea and vomiting after your treatment, we encourage you to take your nausea medication as directed   If you develop nausea and vomiting that is not controlled by your nausea medication, call the clinic.   BELOW ARE SYMPTOMS THAT SHOULD BE REPORTED IMMEDIATELY:  *FEVER GREATER THAN 100.5 F  *CHILLS WITH OR WITHOUT FEVER  NAUSEA AND VOMITING THAT IS NOT CONTROLLED WITH YOUR NAUSEA MEDICATION  *UNUSUAL SHORTNESS OF BREATH  *UNUSUAL BRUISING OR BLEEDING  TENDERNESS IN MOUTH AND THROAT WITH OR WITHOUT PRESENCE OF ULCERS  *URINARY PROBLEMS  *BOWEL PROBLEMS  UNUSUAL RASH Items with * indicate a potential emergency and should be followed up as soon as possible.  Feel free to call the clinic should you have any questions or concerns. The clinic phone number is [phone].  Please show the [facility] at check-in to the Emergency Department and triage nurse.

## 2020-02-06 NOTE — Progress Notes (Signed)
MD states okay to proceed with prehydrations and premeds while CMP results

## 2020-02-06 NOTE — Progress Notes (Signed)
Patient requested to be left accessed for tomorrows appointment. Bio patch and take home dressing applied

## 2020-02-07 ENCOUNTER — Encounter: Payer: BC Managed Care – PPO | Admitting: Nurse Practitioner

## 2020-02-07 ENCOUNTER — Inpatient Hospital Stay: Payer: 59

## 2020-02-07 ENCOUNTER — Other Ambulatory Visit: Payer: Self-pay

## 2020-02-07 VITALS — BP 118/78 | HR 68 | Temp 98.2°F | Resp 18

## 2020-02-07 DIAGNOSIS — C7951 Secondary malignant neoplasm of bone: Secondary | ICD-10-CM

## 2020-02-07 DIAGNOSIS — Z7189 Other specified counseling: Secondary | ICD-10-CM

## 2020-02-07 DIAGNOSIS — Z5112 Encounter for antineoplastic immunotherapy: Secondary | ICD-10-CM | POA: Diagnosis not present

## 2020-02-07 DIAGNOSIS — C9000 Multiple myeloma not having achieved remission: Secondary | ICD-10-CM

## 2020-02-07 LAB — KAPPA/LAMBDA LIGHT CHAINS
Kappa free light chain: 11 mg/L (ref 3.3–19.4)
Kappa, lambda light chain ratio: 0.53 (ref 0.26–1.65)
Lambda free light chains: 20.8 mg/L (ref 5.7–26.3)

## 2020-02-07 MED ORDER — HEPARIN SOD (PORK) LOCK FLUSH 100 UNIT/ML IV SOLN
500.0000 [IU] | Freq: Once | INTRAVENOUS | Status: DC | PRN
  Filled 2020-02-07: qty 5

## 2020-02-07 MED ORDER — SODIUM CHLORIDE 0.9 % IV SOLN
Freq: Once | INTRAVENOUS | Status: DC
  Filled 2020-02-07: qty 250

## 2020-02-07 MED ORDER — SODIUM CHLORIDE 0.9 % IV SOLN
Freq: Once | INTRAVENOUS | Status: AC
Start: 2020-02-07 — End: 2020-02-07
  Filled 2020-02-07: qty 250

## 2020-02-07 MED ORDER — PROCHLORPERAZINE MALEATE 10 MG PO TABS
10.0000 mg | ORAL_TABLET | Freq: Once | ORAL | Status: AC
  Administered 2020-02-07: 10 mg via ORAL

## 2020-02-07 MED ORDER — DEXTROSE 5 % IV SOLN
36.0000 mg/m2 | Freq: Once | INTRAVENOUS | Status: AC
  Administered 2020-02-07: 70 mg via INTRAVENOUS
  Filled 2020-02-07: qty 30

## 2020-02-07 MED ORDER — SODIUM CHLORIDE 0.9% FLUSH
10.0000 mL | INTRAVENOUS | Status: DC | PRN
  Filled 2020-02-07: qty 10

## 2020-02-07 NOTE — Progress Notes (Signed)
Pt discharged in no apparent distress. Pt left ambulatory without assistance. Pt aware of discharge instructions and verbalized understanding and had no further questions.  

## 2020-02-07 NOTE — Patient Instructions (Signed)
[facility] Discharge Instructions for Patients Receiving Chemotherapy  Today you received the following chemotherapy agents: carfilzomib.  To help prevent nausea and vomiting after your treatment, we encourage you to take your nausea medication as directed.   If you develop nausea and vomiting that is not controlled by your nausea medication, call the clinic.   BELOW ARE SYMPTOMS THAT SHOULD BE REPORTED IMMEDIATELY:  *FEVER GREATER THAN 100.5 F  *CHILLS WITH OR WITHOUT FEVER  NAUSEA AND VOMITING THAT IS NOT CONTROLLED WITH YOUR NAUSEA MEDICATION  *UNUSUAL SHORTNESS OF BREATH  *UNUSUAL BRUISING OR BLEEDING  TENDERNESS IN MOUTH AND THROAT WITH OR WITHOUT PRESENCE OF ULCERS  *URINARY PROBLEMS  *BOWEL PROBLEMS  UNUSUAL RASH Items with * indicate a potential emergency and should be followed up as soon as possible.  Feel free to call the clinic should you have any questions or concerns. The clinic phone number is [phone].  Please show the CHEMO ALERT CARD at check-in to the Emergency Department and triage nurse.   

## 2020-02-08 LAB — MULTIPLE MYELOMA PANEL, SERUM
Albumin SerPl Elph-Mcnc: 3.5 g/dL (ref 2.9–4.4)
Albumin/Glob SerPl: 1.7 (ref 0.7–1.7)
Alpha 1: 0.2 g/dL (ref 0.0–0.4)
Alpha2 Glob SerPl Elph-Mcnc: 0.6 g/dL (ref 0.4–1.0)
B-Globulin SerPl Elph-Mcnc: 0.8 g/dL (ref 0.7–1.3)
Gamma Glob SerPl Elph-Mcnc: 0.5 g/dL (ref 0.4–1.8)
Globulin, Total: 2.1 g/dL — ABNORMAL LOW (ref 2.2–3.9)
IgA: 41 mg/dL — ABNORMAL LOW (ref 87–352)
IgG (Immunoglobin G), Serum: 535 mg/dL — ABNORMAL LOW (ref 586–1602)
IgM (Immunoglobulin M), Srm: 25 mg/dL — ABNORMAL LOW (ref 26–217)
Total Protein ELP: 5.6 g/dL — ABNORMAL LOW (ref 6.0–8.5)

## 2020-02-13 ENCOUNTER — Inpatient Hospital Stay: Payer: 59

## 2020-02-13 ENCOUNTER — Other Ambulatory Visit: Payer: Self-pay | Admitting: Hematology

## 2020-02-13 ENCOUNTER — Other Ambulatory Visit: Payer: Self-pay

## 2020-02-13 VITALS — BP 134/86 | HR 73 | Temp 98.5°F | Resp 18 | Ht 67.0 in | Wt 168.5 lb

## 2020-02-13 DIAGNOSIS — C7951 Secondary malignant neoplasm of bone: Secondary | ICD-10-CM

## 2020-02-13 DIAGNOSIS — Z95828 Presence of other vascular implants and grafts: Secondary | ICD-10-CM

## 2020-02-13 DIAGNOSIS — Z5112 Encounter for antineoplastic immunotherapy: Secondary | ICD-10-CM | POA: Diagnosis not present

## 2020-02-13 DIAGNOSIS — Z7189 Other specified counseling: Secondary | ICD-10-CM

## 2020-02-13 DIAGNOSIS — C9000 Multiple myeloma not having achieved remission: Secondary | ICD-10-CM

## 2020-02-13 LAB — CBC WITH DIFFERENTIAL/PLATELET
Abs Immature Granulocytes: 0.02 10*3/uL (ref 0.00–0.07)
Basophils Absolute: 0.1 10*3/uL (ref 0.0–0.1)
Basophils Relative: 1 %
Eosinophils Absolute: 0.6 10*3/uL — ABNORMAL HIGH (ref 0.0–0.5)
Eosinophils Relative: 10 %
HCT: 32.5 % — ABNORMAL LOW (ref 36.0–46.0)
Hemoglobin: 11 g/dL — ABNORMAL LOW (ref 12.0–15.0)
Immature Granulocytes: 0 %
Lymphocytes Relative: 25 %
Lymphs Abs: 1.5 10*3/uL (ref 0.7–4.0)
MCH: 29.9 pg (ref 26.0–34.0)
MCHC: 33.8 g/dL (ref 30.0–36.0)
MCV: 88.3 fL (ref 80.0–100.0)
Monocytes Absolute: 0.8 10*3/uL (ref 0.1–1.0)
Monocytes Relative: 14 %
Neutro Abs: 2.9 10*3/uL (ref 1.7–7.7)
Neutrophils Relative %: 50 %
Platelets: 183 10*3/uL (ref 150–400)
RBC: 3.68 MIL/uL — ABNORMAL LOW (ref 3.87–5.11)
RDW: 17.5 % — ABNORMAL HIGH (ref 11.5–15.5)
WBC: 5.8 10*3/uL (ref 4.0–10.5)
nRBC: 0 % (ref 0.0–0.2)

## 2020-02-13 LAB — CMP (CANCER CENTER ONLY)
ALT: 20 U/L (ref 0–44)
AST: 13 U/L — ABNORMAL LOW (ref 15–41)
Albumin: 3.5 g/dL (ref 3.5–5.0)
Alkaline Phosphatase: 54 U/L (ref 38–126)
Anion gap: 7 (ref 5–15)
BUN: 12 mg/dL (ref 6–20)
CO2: 27 mmol/L (ref 22–32)
Calcium: 9 mg/dL (ref 8.9–10.3)
Chloride: 107 mmol/L (ref 98–111)
Creatinine: 0.83 mg/dL (ref 0.44–1.00)
GFR, Estimated: >60 mL/min (ref >60)
Glucose, Bld: 115 mg/dL — ABNORMAL HIGH (ref 70–99)
Potassium: 3.9 mmol/L (ref 3.5–5.1)
Sodium: 141 mmol/L (ref 135–145)
Total Bilirubin: 1.2 mg/dL (ref 0.3–1.2)
Total Protein: 6 g/dL — ABNORMAL LOW (ref 6.5–8.1)

## 2020-02-13 MED ORDER — DEXTROSE 5 % IV SOLN
36.0000 mg/m2 | Freq: Once | INTRAVENOUS | Status: AC
  Administered 2020-02-13: 70 mg via INTRAVENOUS
  Filled 2020-02-13: qty 30

## 2020-02-13 MED ORDER — SODIUM CHLORIDE 0.9% FLUSH
10.0000 mL | Freq: Once | INTRAVENOUS | Status: DC
  Filled 2020-02-13: qty 10

## 2020-02-13 MED ORDER — SODIUM CHLORIDE 0.9 % IV SOLN
20.0000 mg | Freq: Once | INTRAVENOUS | Status: AC
  Administered 2020-02-13: 20 mg via INTRAVENOUS
  Filled 2020-02-13: qty 20

## 2020-02-13 MED ORDER — SODIUM CHLORIDE 0.9% FLUSH
10.0000 mL | INTRAVENOUS | Status: DC | PRN
  Administered 2020-02-13: 10 mL
  Filled 2020-02-13: qty 10

## 2020-02-13 MED ORDER — HEPARIN SOD (PORK) LOCK FLUSH 100 UNIT/ML IV SOLN
500.0000 [IU] | Freq: Once | INTRAVENOUS | Status: AC | PRN
  Administered 2020-02-13: 500 [IU]
  Filled 2020-02-13: qty 5

## 2020-02-13 MED ORDER — SODIUM CHLORIDE 0.9 % IV SOLN
Freq: Once | INTRAVENOUS | Status: AC
  Filled 2020-02-13: qty 250

## 2020-02-13 NOTE — Patient Instructions (Signed)
[facility] Discharge Instructions for Patients Receiving Chemotherapy  Today you received the following chemotherapy agents: Carfilzomib (Kyprolis)   To help prevent nausea and vomiting after your treatment, we encourage you to take your nausea medication  as prescribed.    If you develop nausea and vomiting that is not controlled by your nausea medication, call the clinic.   BELOW ARE SYMPTOMS THAT SHOULD BE REPORTED IMMEDIATELY:  *FEVER GREATER THAN 100.5 F  *CHILLS WITH OR WITHOUT FEVER  NAUSEA AND VOMITING THAT IS NOT CONTROLLED WITH YOUR NAUSEA MEDICATION  *UNUSUAL SHORTNESS OF BREATH  *UNUSUAL BRUISING OR BLEEDING  TENDERNESS IN MOUTH AND THROAT WITH OR WITHOUT PRESENCE OF ULCERS  *URINARY PROBLEMS  *BOWEL PROBLEMS  UNUSUAL RASH Items with * indicate a potential emergency and should be followed up as soon as possible.  Feel free to call the clinic should you have any questions or concerns. The clinic phone number is [phone].  Please show the CHEMO ALERT CARD at check-in to the Emergency Department and triage nurse.   

## 2020-02-14 ENCOUNTER — Inpatient Hospital Stay: Payer: 59

## 2020-02-14 ENCOUNTER — Other Ambulatory Visit: Payer: Self-pay

## 2020-02-14 VITALS — BP 151/91 | HR 63 | Temp 98.7°F | Resp 18

## 2020-02-14 DIAGNOSIS — Z5112 Encounter for antineoplastic immunotherapy: Secondary | ICD-10-CM | POA: Diagnosis not present

## 2020-02-14 DIAGNOSIS — C9000 Multiple myeloma not having achieved remission: Secondary | ICD-10-CM

## 2020-02-14 DIAGNOSIS — C7951 Secondary malignant neoplasm of bone: Secondary | ICD-10-CM

## 2020-02-14 DIAGNOSIS — Z7189 Other specified counseling: Secondary | ICD-10-CM

## 2020-02-14 MED ORDER — PROCHLORPERAZINE MALEATE 10 MG PO TABS
ORAL_TABLET | ORAL | Status: AC
  Filled 2020-02-14: qty 1

## 2020-02-14 MED ORDER — PROCHLORPERAZINE MALEATE 10 MG PO TABS
10.0000 mg | ORAL_TABLET | Freq: Once | ORAL | Status: AC
  Administered 2020-02-14: 10 mg via ORAL

## 2020-02-14 MED ORDER — DEXTROSE 5 % IV SOLN
36.0000 mg/m2 | Freq: Once | INTRAVENOUS | Status: AC
  Administered 2020-02-14: 70 mg via INTRAVENOUS
  Filled 2020-02-14: qty 5

## 2020-02-14 MED ORDER — SODIUM CHLORIDE 0.9 % IV SOLN
Freq: Once | INTRAVENOUS | Status: AC
  Filled 2020-02-14: qty 250

## 2020-02-14 MED ORDER — SODIUM CHLORIDE 0.9% FLUSH
10.0000 mL | INTRAVENOUS | Status: DC | PRN
  Administered 2020-02-14: 10 mL
  Filled 2020-02-14: qty 10

## 2020-02-14 MED ORDER — HEPARIN SOD (PORK) LOCK FLUSH 100 UNIT/ML IV SOLN
500.0000 [IU] | Freq: Once | INTRAVENOUS | Status: AC | PRN
  Administered 2020-02-14: 500 [IU]
  Filled 2020-02-14: qty 5

## 2020-02-14 NOTE — Patient Instructions (Signed)
[facility] Discharge Instructions for Patients Receiving Chemotherapy  Today you received the following chemotherapy agents Carfilzomib (KYPROLIS).  To help prevent nausea and vomiting after your treatment, we encourage you to take your nausea medication as prescribed.  If you develop nausea and vomiting that is not controlled by your nausea medication, call the clinic.   BELOW ARE SYMPTOMS THAT SHOULD BE REPORTED IMMEDIATELY:  *FEVER GREATER THAN 100.5 F  *CHILLS WITH OR WITHOUT FEVER  NAUSEA AND VOMITING THAT IS NOT CONTROLLED WITH YOUR NAUSEA MEDICATION  *UNUSUAL SHORTNESS OF BREATH  *UNUSUAL BRUISING OR BLEEDING  TENDERNESS IN MOUTH AND THROAT WITH OR WITHOUT PRESENCE OF ULCERS  *URINARY PROBLEMS  *BOWEL PROBLEMS  UNUSUAL RASH Items with * indicate a potential emergency and should be followed up as soon as possible.  Feel free to call the clinic should you have any questions or concerns. The clinic phone number is [phone].  Please show the CHEMO ALERT CARD at check-in to the Emergency Department and triage nurse.   

## 2020-02-15 ENCOUNTER — Ambulatory Visit (INDEPENDENT_AMBULATORY_CARE_PROVIDER_SITE_OTHER): Payer: 59 | Admitting: Nurse Practitioner

## 2020-02-15 ENCOUNTER — Encounter: Payer: Self-pay | Admitting: Nurse Practitioner

## 2020-02-15 VITALS — BP 128/70 | HR 61 | Temp 98.5°F | Ht 67.2 in | Wt 168.4 lb

## 2020-02-15 DIAGNOSIS — Z0000 Encounter for general adult medical examination without abnormal findings: Secondary | ICD-10-CM

## 2020-02-15 DIAGNOSIS — R82998 Other abnormal findings in urine: Secondary | ICD-10-CM

## 2020-02-15 DIAGNOSIS — G4700 Insomnia, unspecified: Secondary | ICD-10-CM | POA: Diagnosis not present

## 2020-02-15 DIAGNOSIS — Z1231 Encounter for screening mammogram for malignant neoplasm of breast: Secondary | ICD-10-CM

## 2020-02-15 DIAGNOSIS — H6123 Impacted cerumen, bilateral: Secondary | ICD-10-CM | POA: Diagnosis not present

## 2020-02-15 DIAGNOSIS — E782 Mixed hyperlipidemia: Secondary | ICD-10-CM

## 2020-02-15 DIAGNOSIS — I10 Essential (primary) hypertension: Secondary | ICD-10-CM

## 2020-02-15 DIAGNOSIS — C9000 Multiple myeloma not having achieved remission: Secondary | ICD-10-CM | POA: Diagnosis not present

## 2020-02-15 DIAGNOSIS — R7303 Prediabetes: Secondary | ICD-10-CM

## 2020-02-15 LAB — POCT URINALYSIS DIPSTICK
Bilirubin, UA: NEGATIVE
Glucose, UA: NEGATIVE
Ketones, UA: NEGATIVE
Nitrite, UA: NEGATIVE
Protein, UA: POSITIVE — AB
Spec Grav, UA: 1.025 (ref 1.010–1.025)
Urobilinogen, UA: 0.2 E.U./dL
pH, UA: 5.5 (ref 5.0–8.0)

## 2020-02-15 LAB — POCT UA - MICROALBUMIN
Creatinine, POC: 200 mg/dL
Microalbumin Ur, POC: 150 mg/L

## 2020-02-15 MED ORDER — ESZOPICLONE 3 MG PO TABS
3.0000 mg | ORAL_TABLET | Freq: Every evening | ORAL | 0 refills | Status: DC | PRN
Start: 2020-02-15 — End: 2020-05-03

## 2020-02-15 NOTE — Patient Instructions (Signed)
Health Maintenance, Female Adopting a healthy lifestyle and getting preventive care are important in promoting health and wellness. Ask your health care provider about:  The right schedule for you to have regular tests and exams.  Things you can do on your own to prevent diseases and keep yourself healthy. What should I know about diet, weight, and exercise? Eat a healthy diet  Eat a diet that includes plenty of vegetables, fruits, low-fat dairy products, and lean protein.  Do not eat a lot of foods that are high in solid fats, added sugars, or sodium.   Maintain a healthy weight Body mass index (BMI) is used to identify weight problems. It estimates body fat based on height and weight. Your health care provider can help determine your BMI and help you achieve or maintain a healthy weight. Get regular exercise Get regular exercise. This is one of the most important things you can do for your health. Most adults should:  Exercise for at least 150 minutes each week. The exercise should increase your heart rate and make you sweat (moderate-intensity exercise).  Do strengthening exercises at least twice a week. This is in addition to the moderate-intensity exercise.  Spend less time sitting. Even light physical activity can be beneficial. Watch cholesterol and blood lipids Have your blood tested for lipids and cholesterol at 57 years of age, then have this test every 5 years. Have your cholesterol levels checked more often if:  Your lipid or cholesterol levels are high.  You are older than 57 years of age.  You are at high risk for heart disease. What should I know about cancer screening? Depending on your health history and family history, you may need to have cancer screening at various ages. This may include screening for:  Breast cancer.  Cervical cancer.  Colorectal cancer.  Skin cancer.  Lung cancer. What should I know about heart disease, diabetes, and high blood  pressure? Blood pressure and heart disease  High blood pressure causes heart disease and increases the risk of stroke. This is more likely to develop in people who have high blood pressure readings, are of African descent, or are overweight.  Have your blood pressure checked: ? Every 3-5 years if you are 18-39 years of age. ? Every year if you are 40 years old or older. Diabetes Have regular diabetes screenings. This checks your fasting blood sugar level. Have the screening done:  Once every three years after age 40 if you are at a normal weight and have a low risk for diabetes.  More often and at a younger age if you are overweight or have a high risk for diabetes. What should I know about preventing infection? Hepatitis B If you have a higher risk for hepatitis B, you should be screened for this virus. Talk with your health care provider to find out if you are at risk for hepatitis B infection. Hepatitis C Testing is recommended for:  Everyone born from 1945 through 1965.  Anyone with known risk factors for hepatitis C. Sexually transmitted infections (STIs)  Get screened for STIs, including gonorrhea and chlamydia, if: ? You are sexually active and are younger than 57 years of age. ? You are older than 57 years of age and your health care provider tells you that you are at risk for this type of infection. ? Your sexual activity has changed since you were last screened, and you are at increased risk for chlamydia or gonorrhea. Ask your health care provider   if you are at risk.  Ask your health care provider about whether you are at high risk for HIV. Your health care provider may recommend a prescription medicine to help prevent HIV infection. If you choose to take medicine to prevent HIV, you should first get tested for HIV. You should then be tested every 3 months for as long as you are taking the medicine. Pregnancy  If you are about to stop having your period (premenopausal) and  you may become pregnant, seek counseling before you get pregnant.  Take 400 to 800 micrograms (mcg) of folic acid every day if you become pregnant.  Ask for birth control (contraception) if you want to prevent pregnancy. Osteoporosis and menopause Osteoporosis is a disease in which the bones lose minerals and strength with aging. This can result in bone fractures. If you are 65 years old or older, or if you are at risk for osteoporosis and fractures, ask your health care provider if you should:  Be screened for bone loss.  Take a calcium or vitamin D supplement to lower your risk of fractures.  Be given hormone replacement therapy (HRT) to treat symptoms of menopause. Follow these instructions at home: Lifestyle  Do not use any products that contain nicotine or tobacco, such as cigarettes, e-cigarettes, and chewing tobacco. If you need help quitting, ask your health care provider.  Do not use street drugs.  Do not share needles.  Ask your health care provider for help if you need support or information about quitting drugs. Alcohol use  Do not drink alcohol if: ? Your health care provider tells you not to drink. ? You are pregnant, may be pregnant, or are planning to become pregnant.  If you drink alcohol: ? Limit how much you use to 0-1 drink a day. ? Limit intake if you are breastfeeding.  Be aware of how much alcohol is in your drink. In the [place] one drink equals one 12 oz bottle of beer (355 mL), one 5 oz glass of wine (148 mL), or one 1 oz glass of hard liquor (44 mL). General instructions  Schedule regular health, dental, and eye exams.  Stay current with your vaccines.  Tell your health care provider if: ? You often feel depressed. ? You have ever been abused or do not feel safe at home. Summary  Adopting a healthy lifestyle and getting preventive care are important in promoting health and wellness.  Follow your health care provider's instructions about healthy  diet, exercising, and getting tested or screened for diseases.  Follow your health care provider's instructions on monitoring your cholesterol and blood pressure. This information is not intended to replace advice given to you by your health care provider. Make sure you discuss any questions you have with your health care provider. Document Revised: 12/30/2017 Document Reviewed: 12/30/2017 Elsevier Patient Education  2021 [employer] Inc.  

## 2020-02-15 NOTE — Progress Notes (Signed)
I,[name],acting as a Education administrator for [employer], NP.,have documented all relevant documentation on the behalf of [employer], NP,as directed by  Bary Castilla, NP while in the presence of Bary Castilla, NP.  This visit occurred during the SARS-CoV-2 public health emergency.  Safety protocols were in place, including screening questions prior to the visit, additional usage of staff PPE, and extensive cleaning of exam room while observing appropriate contact time as indicated for disinfecting solutions.  Subjective:     Patient ID: Alexandra [name] , female    DOB: 27-Sep-1965 , 55 y.o.   MRN: [id number]   Chief Complaint  Patient presents with  . Annual Exam    HPI  Patient is here for physical exam. She is followed by Lynnell Chad for her GYN care [facility]. She is also seen by the oncologist Kale every Monday.  She is compliant with her medication. She is having some trouble at night sleeping. Otherwise she is doing well. She does try to eat healthy and exercise as much as she can. She is doing well overall despite getting chemo.      Past Medical History:  Diagnosis Date  . Anxiety   . Hypertension   . Pre-diabetes      Family History  Problem Relation Age of Onset  . Breast cancer Maternal Grandmother   . Heart disease Mother   . Hypertension Mother   . Sarcoidosis Father   . Diabetes Maternal Grandfather      Current Outpatient Medications:  .  acetaminophen (TYLENOL) 650 MG CR tablet, Take 1,300 mg by mouth every 8 (eight) hours as needed for pain., Disp: , Rfl:  .  aspirin EC 81 MG tablet, Take 1 tablet (81 mg total) by mouth daily., Disp: 100 tablet, Rfl: 3 .  atorvastatin (LIPITOR) 10 MG tablet, TAKE 1 TABLET BY MOUTH EVERY DAY, Disp: 90 tablet, Rfl: 1 .  Cholecalciferol (VITAMIN D) 50 MCG (2000 UT) tablet, Take 2,000 Units by mouth daily., Disp: , Rfl:  .  Coenzyme Q10 (COQ-10) 100 MG CAPS, Take 200 mg by mouth daily., Disp: , Rfl:  .   dexamethasone (DECADRON) 4 MG tablet, Take 10 tablets (33m) once on day 22. Repeat every 28 days for the first 4 cycles. Take with breakfast., Disp: 40 tablet, Rfl: 4 .  Insulin Pen Needle (NOVOFINE PLUS) 32G X 4 MM MISC, Inject 1 pen into the skin daily. Inject 1 pen with Victoza SQ, Disp: 100 each, Rfl: 3 .  levocetirizine (XYZAL) 5 MG tablet, TAKE 1 TABLET BY MOUTH EVERY DAY IN THE EVENING (Patient taking differently: Take 5 mg by mouth every evening.), Disp: 30 tablet, Rfl: 2 .  lidocaine-prilocaine (EMLA) cream, Apply to affected area once (Patient taking differently: Apply 1 application topically daily as needed (port access).), Disp: 30 g, Rfl: 3 .  LORazepam (ATIVAN) 0.5 MG tablet, Take 1 tablet (0.5 mg total) by mouth every 6 (six) hours as needed (Nausea or vomiting)., Disp: 30 tablet, Rfl: 0 .  Multiple Vitamins-Minerals (IMMUNE SUPPORT PO), Take 2 tablets by mouth daily., Disp: , Rfl:  .  ondansetron (ZOFRAN) 8 MG tablet, Take 1 tablet (8 mg total) by mouth 2 (two) times daily as needed (Nausea or vomiting)., Disp: 30 tablet, Rfl: 1 .  Oxymetazoline HCl (AFRIN NODRIP SEVERE CONGEST NA), Place 1 spray into the nose daily as needed (congestion)., Disp: , Rfl:  .  OZEMPIC, 0.25 OR 0.5 MG/DOSE, 2 MG/1.5ML SOPN, INJECT 0.5 MG TOTAL INTO THE  SKIN ONCE A WEEK. (Patient taking differently: Inject 0.5 mg as directed every Friday.), Disp: 4.5 mL, Rfl: 1 .  prochlorperazine (COMPAZINE) 10 MG tablet, Take 1 tablet (10 mg total) by mouth every 6 (six) hours as needed (Nausea or vomiting)., Disp: 30 tablet, Rfl: 1 .  REVLIMID 25 MG capsule, TAKE 1 CAPSULE BY MOUTH EVERY DAY FOR 21 DAYS ON THEN 7 DAYS OFF, Disp: 21 capsule, Rfl: 0 .  senna (SENOKOT) 8.6 MG TABS tablet, Take 17.2 mg by mouth daily as needed for mild constipation., Disp: , Rfl:  .  Turmeric Curcumin 500 MG CAPS, Take 500 mg by mouth daily., Disp: , Rfl:  .  valACYclovir (VALTREX) 1000 MG tablet, Take 1 tablet (1,000 mg total) by mouth  daily. Patient taking it as needed (Patient taking differently: Take 1,000 mg by mouth daily.), Disp: 90 tablet, Rfl: 1 .  vitamin B-12 (CYANOCOBALAMIN) 1000 MCG tablet, Take 1,000 mcg by mouth daily., Disp: , Rfl:  .  Eszopiclone 3 MG TABS, Take 1 tablet (3 mg total) by mouth at bedtime as needed (sleep). Take immediately before bedtime, Disp: 30 tablet, Rfl: 0   No Known Allergies   Last LMP was Patient's last menstrual period was 07/09/2015..Negative for: breast discharge, breast lump(s), breast pain and breast self exam. Associated symptoms include abnormal vaginal bleeding. Pertinent negatives include abnormal bleeding (hematology), anxiety, decreased libido, depression, difficulty falling sleep, dyspareunia, history of infertility, nocturia, sexual dysfunction, sleep disturbances, urinary incontinence, urinary urgency, vaginal discharge and vaginal itching. Diet regular.The patient states her exercise level is    . The patient's tobacco use is:  Social History   Tobacco Use  Smoking Status Never Smoker  Smokeless Tobacco Never Used  . She has been exposed to passive smoke. The patient's alcohol use is:  Social History   Substance and Sexual Activity  Alcohol Use Yes   Comment: occassionally  . Additional information:   Review of Systems  Constitutional: Negative.  Negative for chills and fever.  HENT: Negative.  Negative for ear pain.   Eyes: Negative.  Negative for pain.  Respiratory: Negative.  Negative for chest tightness, shortness of breath and wheezing.   Cardiovascular: Negative.  Negative for chest pain and palpitations.  Gastrointestinal: Negative.  Negative for constipation, diarrhea and vomiting.  Endocrine: Negative.  Negative for polydipsia, polyphagia and polyuria.  Genitourinary: Negative.  Negative for dysuria.  Musculoskeletal: Negative.  Negative for back pain and joint swelling.  Skin: Negative.   Allergic/Immunologic: Negative.   Neurological: Negative.   Negative for numbness and headaches.  Hematological: Negative.   Psychiatric/Behavioral: Positive for sleep disturbance.       Has trouble sleeping at times   All other systems reviewed and are negative.    Today's Vitals   02/15/20 1001  BP: 128/70  Pulse: 61  Temp: 98.5 F (36.9 C)  TempSrc: Oral  Weight: 168 lb 6.4 oz (76.4 kg)  Height: 5' 7.2" (1.707 m)   Body mass index is 26.22 kg/m.  Wt Readings from Last 3 Encounters:  02/15/20 168 lb 6.4 oz (76.4 kg)  02/13/20 168 lb 8 oz (76.4 kg)  02/06/20 166 lb 8 oz (75.5 kg)    Objective:  Physical Exam Constitutional:      General: She is not in acute distress.    Appearance: Normal appearance. She is well-developed. She is obese.  HENT:     Head: Normocephalic and atraumatic.     Right Ear: Hearing, tympanic membrane, ear canal and  external ear normal. There is impacted cerumen.     Left Ear: Hearing, tympanic membrane, ear canal and external ear normal. There is impacted cerumen.     Nose:     Comments: Deferred - masked    Mouth/Throat:     Comments: Deferred - masked Eyes:     General: Lids are normal.     Extraocular Movements: Extraocular movements intact.     Conjunctiva/sclera: Conjunctivae normal.     Pupils: Pupils are equal, round, and reactive to light.     Funduscopic exam:    Right eye: No papilledema.        Left eye: No papilledema.  Neck:     Thyroid: No thyroid mass.     Vascular: No carotid bruit.  Cardiovascular:     Rate and Rhythm: Normal rate and regular rhythm.     Pulses: Normal pulses.     Heart sounds: Normal heart sounds. No murmur heard.   Pulmonary:     Effort: Pulmonary effort is normal. No respiratory distress.     Breath sounds: Normal breath sounds. No wheezing.  Chest:     Chest wall: No mass.  Breasts:     Tanner Score is 5.     Right: Normal. No mass, tenderness, axillary adenopathy or supraclavicular adenopathy.     Left: Normal. No mass, tenderness, axillary  adenopathy or supraclavicular adenopathy.     Abdominal:     General: Abdomen is flat. Bowel sounds are normal. There is no distension.     Palpations: Abdomen is soft.     Tenderness: There is no abdominal tenderness.  Genitourinary:    Rectum: Guaiac result negative.     Comments: Deferred. She sees a OBGYN Musculoskeletal:        General: No swelling. Normal range of motion.     Cervical back: Full passive range of motion without pain, normal range of motion and neck supple.     Right lower leg: No edema.     Left lower leg: No edema.  Lymphadenopathy:     Upper Body:     Right upper body: No supraclavicular, axillary or pectoral adenopathy.     Left upper body: No supraclavicular, axillary or pectoral adenopathy.  Skin:    General: Skin is warm and dry.     Capillary Refill: Capillary refill takes less than 2 seconds.  Neurological:     General: No focal deficit present.     Mental Status: She is alert and oriented to person, place, and time.     Cranial Nerves: No cranial nerve deficit.     Sensory: No sensory deficit.  Psychiatric:        Mood and Affect: Mood normal.        Behavior: Behavior normal.        Thought Content: Thought content normal.        Judgment: Judgment normal.         Assessment And Plan:     1. Routine general medical examination at a health care facility -Patient is here for a annul physical exam.  -The patient and I had a discussion of the importance of immunization, screenings as well as multivitamins.  -She was recently diagnosed with multiple myeloma so she is being seen and going through treatment with Dr. Irene Limbo oncologist.   2. Essential hypertension -Chronic, stable - POCT Urinalysis Dipstick (81002) - POCT UA - Microalbumin - EKG 12-Lead  3. Multiple myeloma not having achieved remission ([place]) -  Patient recently diagnosed last year. Is being followed by the oncologist Dr. Irene Limbo and is getting treatment.   4. Insomnia, unspecified  type - Eszopiclone 3 MG TABS; Take 1 tablet (3 mg total) by mouth at bedtime as needed (sleep). Take immediately before bedtime  Dispense: 30 tablet; Refill: 0  5. Impacted cerumen of both ears -used curette to clean ears in office today   6. Prediabetes - Hemoglobin A1c  7. Mixed hyperlipidemia - Lipid panel  8. Screening mammogram for breast cancer - MM Digital Screening; Future  9. Leukocytes in urine - Culture, Urine  Staying healthy and adopting a healthy lifestyle for your overall health is important. You should eat 7 or more servings of fruits and vegetables per day. You should drink plenty of water to keep yourself hydrated and your kidneys healthy. This includes about 65-80+ fluid ounces of water. Limit your intake of animal fats especially for elevated cholesterol. Avoid highly processed food and limit your salt intake if you have hypertension. Avoid foods high in saturated/Trans fats. Along with a healthy diet it is also very important to maintain time for yourself to maintain a healthy mental health with low stress levels. You should get atleast 150 min of moderate intensity exercise weekly for a healthy heart. Along with eating right and exercising, aim for at least 7-9 hours of sleep daily.  Eat more whole grains which includes barley, wheat berries, oats, brown rice and whole wheat pasta. Use healthy plant oils which include olive, soy, corn, sunflower and peanut. Limit your caffeine and sugary drinks. Limit your intake of fast foods. Limit milk and dairy products to one or two daily servings.   Patient was given opportunity to ask questions. Patient verbalized understanding of the plan and was able to repeat key elements of the plan. All questions were answered to their satisfaction.   Bary Castilla, NP   I, Bary Castilla, NP, have reviewed all documentation for this visit. The documentation on 02/15/20 for the exam, diagnosis, procedures, and orders are all accurate  and complete.  THE PATIENT IS ENCOURAGED TO PRACTICE SOCIAL DISTANCING DUE TO THE COVID-19 PANDEMIC.

## 2020-02-16 ENCOUNTER — Other Ambulatory Visit: Payer: Self-pay | Admitting: Hematology

## 2020-02-16 DIAGNOSIS — Z7189 Other specified counseling: Secondary | ICD-10-CM

## 2020-02-16 DIAGNOSIS — C9000 Multiple myeloma not having achieved remission: Secondary | ICD-10-CM

## 2020-02-16 LAB — LIPID PANEL
Chol/HDL Ratio: 2.2 ratio (ref 0.0–4.4)
Cholesterol, Total: 140 mg/dL (ref 100–199)
HDL: 63 mg/dL (ref >39)
LDL Chol Calc (NIH): 62 mg/dL (ref 0–99)
Triglycerides: 74 mg/dL (ref 0–149)
VLDL Cholesterol Cal: 15 mg/dL (ref 5–40)

## 2020-02-16 LAB — HEMOGLOBIN A1C
Est. average glucose Bld gHb Est-mCnc: 97 mg/dL
Hgb A1c MFr Bld: 5 % (ref 4.8–5.6)

## 2020-02-16 LAB — URINE CULTURE

## 2020-02-16 MED FILL — ONDANSETRON HCL 8 MG TABLET: 8 | 21 days supply | Qty: 18 | Fill #1

## 2020-02-16 MED FILL — DEXAMETHASONE 4 MG TABLET: 4 | 84 days supply | Qty: 30 | Fill #1

## 2020-02-16 MED FILL — LORazepam 0.5 MG TABS: 0.5 | 30 days supply | Qty: 30 | Fill #0

## 2020-02-16 MED FILL — PROCHLORPERAZINE 10 MG TAB: 10 | 8 days supply | Qty: 30 | Fill #1

## 2020-02-16 NOTE — Telephone Encounter (Signed)
Please review for refill.  

## 2020-02-17 ENCOUNTER — Other Ambulatory Visit: Payer: Self-pay

## 2020-02-17 DIAGNOSIS — C9000 Multiple myeloma not having achieved remission: Secondary | ICD-10-CM

## 2020-02-17 DIAGNOSIS — Z7189 Other specified counseling: Secondary | ICD-10-CM

## 2020-02-17 MED ORDER — LENALIDOMIDE 25 MG PO CAPS: ORAL_CAPSULE | ORAL | 0 refills | Status: DC

## 2020-02-26 NOTE — Progress Notes (Signed)
HEMATOLOGY/ONCOLOGY CONSULTATION NOTE  Date of Service: 02/26/2020  Patient Care Team: [name], FNP as PCP - General (General Practice)  CHIEF COMPLAINTS/PURPOSE OF CONSULTATION:  Multiple myeloma  HISTORY OF PRESENTING ILLNESS:   Alexandra [name] is a wonderful 55 y.o. female who has been referred to [place] by Dr. 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 The pt reports that she is doing well overall.   The pt reports that she was taking Benazepril-HCTZ for her HTN but was told to discontinue as she was becoming hypotensive while in the hospital. She continues taking Lipitor for her HLD and was previously on Ozempic for her Type 2 Diabetes. She has no significant family history of cancers or blood disorders. Pt has no known medication allergies. Pt had a hammer toe surgery and a tubal ligation. 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   In mid-June she began to notice a growth on the left side of her forehead. Because the lump continued to grow she visited her PCP at the end of June. Her PCP and Dermatologist thought that it was a lipoma initally. She was sent for a consult with Dr. Harlow Mares at [facility] in August. She received her first cranial MRI on 10/05/2019, which revealed a mass and bone erosion. Pt had her initial Craniectomy on 10/11/2019 and a Left Bifrontal Cranioplasty on 10/14/2019. Pt denies any new bone pain, fevers, chills, or night sweats at the time. Pt has lost 40 lbs over the last year, but this was after the addition of Ozempic and improved dietary and exercise habits. She has started regaining weight after holding Ozempic. At this time pt reports mild discomfort at surgical site and no other pertinent symptoms.  Of note prior to the patient's visit today, pt has had Surgical Pathology [phone]) completed on 10/10/2019 with results revealing "A. BRAIN  TUMOR, LEFT FRONTAL, RESECTION: - Plasma cell neoplasm B. BRAIN TUMOR, LEFT FRONTAL, RESECTION: - Plasma cell neoplasm C. BONE, LEFT FRONTAL, RESECTION: - Plasma cell neoplasm."   Pt has had MRI Brain ([id number]) completed on 10/11/2019 with results revealing "No complicating feature or residual tumor seen after left frontal bone mass resection."  Pt has had MRI Brain ([id number]) completed on 10/05/2019 with results revealing "1. Large extra-axial mass in the anterior left frontal region with bone erosion and extension into the scalp soft tissues. Considerations include hemangiopericytoma, metastasis, and aggressive meningioma. 2. 6 mm enhancing lesion in the clivus. This is indeterminate but does raise concern for metastatic disease. 3. Mild chronic small vessel ischemic disease."  Most recent lab results (10/11/2019) of CBC & BMP is as follows: all values are WNL except for WBC at 16.6K, RBC at 2.99, Hgb at 9.1, HCT at 26.5, Glucose at 104, Calcium at 8.4.  On review of systems, pt reports discomfort at surgicalsite and denies balance issues, memory loss, headaches, fevers, chills, unexpected weight loss and any othe symptoms.   On PMHx the pt reports HTN, HLD, Diabetes, Tubal Ligation, Hammer Toe Surgery, Herpes. On Social Hx the pt reports that she previously smoked on and off but is not currently smoking and does not drink much alcohol. On Family Hx the pt reports a grandfather with prostate cancer.  INTERVAL HISTORY:  Alexandra [name] is a wonderful 55 y.o. 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 The patient's last visit with [place] was on  01/30/2020. The pt reports that she is doing well overall. She is here for C4D1 of KRD and next dose of Zometa.  The pt reports no new symptoms or concerns. She notes that she forgot to take the medicine one day until later at night, which caused hand cramping the day after. But she has not experienced any other  symptoms outside of this. The pt notes that she went to her PCP for a physical on 02/13/2020 and her doctor notes that following a Urinalysis she had some protein and trace leukocytes in her urine.   The pt notes that she experiences trouble sleeping, especially on Mondays when she receives treatment. She claims the Lorazepam helps when she takes it, but notes Melatonin does not.  The pt notes that she went to the Neurosurgeon for her head swelling and it has improved since. She will be getting an MRI in three months to make sure there is no growth underneath.  Lab results today 02/27/2020 of CBC w/diff and CMP is as follows: all values are WNL except for WBC of 3.7K, RBC of 3.51, Hgb of 10.8, HCT of 32.5, RDW of 17.6, Neutro Abs of 1.5K, Glucose of 104, Calcium of 8.7, Total Protein of 5.9, Total Bilirubin of 1.4.  On review of systems, pt reports difficulty sleeping and denies nausea, decreased appetite, weight loss, pain along spine, lower back pain, abdominal pain, leg swelling and any other symptoms.  MEDICAL HISTORY:  Past Medical History:  Diagnosis Date  . Anxiety   . Hypertension   . Pre-diabetes     SURGICAL HISTORY: Past Surgical History:  Procedure Laterality Date  . ABLATION    . APPLICATION OF CRANIAL NAVIGATION N/A 10/10/2019   Procedure: APPLICATION OF CRANIAL NAVIGATION;  Surgeon: Consuella Lose, MD;  Location: [place];  Service: Neurosurgery;  Laterality: N/A;  APPLICATION OF CRANIAL NAVIGATION  . CRANIOPLASTY Left 10/14/2019   Procedure: LEFT CRANIOPLASTY WITH PLACEMENT OF CUSTOM CRANIAL IMPLANT;  Surgeon: Consuella Lose, MD;  Location: [place];  Service: Neurosurgery;  Laterality: Left;  . CRANIOTOMY Left 10/10/2019   Procedure: Left frontal Stereotactic craniectomy for resection of tumor;  Surgeon: Consuella Lose, MD;  Location: [place];  Service: Neurosurgery;  Laterality: Left;  Left frontal Stereotactic craniectomy for resection of tumor  . HAMMER TOE SURGERY  Left   . IR IMAGING GUIDED PORT INSERTION  12/01/2019  . TUBAL LIGATION      SOCIAL HISTORY: Social History   Socioeconomic History  . Marital status: Single    Spouse name: Not on file  . Number of children: Not on file  . Years of education: Not on file  . Highest education level: Not on file  Occupational History  . Not on file  Tobacco Use  . Smoking status: Never Smoker  . Smokeless tobacco: Never Used  Vaping Use  . Vaping Use: Never used  Substance and Sexual Activity  . Alcohol use: Yes    Comment: occassionally  . Drug use: Not Currently  . Sexual activity: Not on file  Other Topics Concern  . Not on file  Social History Narrative  . Not on file   Social Determinants of Health   Financial Resource Strain: Not on file  Food Insecurity: Not on file  Transportation Needs: Not on file  Physical Activity: Not on file  Stress: Not on file  Social Connections: Not on file  Intimate Partner Violence: Not on file    FAMILY HISTORY: Family History  Problem Relation  Age of Onset  . Breast cancer Maternal Grandmother   . Heart disease Mother   . Hypertension Mother   . Sarcoidosis Father   . Diabetes Maternal Grandfather     ALLERGIES:  has No Known Allergies.  MEDICATIONS:  Current Outpatient Medications  Medication Sig Dispense Refill  . acetaminophen (TYLENOL) 650 MG CR tablet Take 1,300 mg by mouth every 8 (eight) hours as needed for pain.    Marland Kitchen aspirin EC 81 MG tablet Take 1 tablet (81 mg total) by mouth daily. 100 tablet 3  . atorvastatin (LIPITOR) 10 MG tablet TAKE 1 TABLET BY MOUTH EVERY DAY 90 tablet 1  . Cholecalciferol (VITAMIN D) 50 MCG (2000 UT) tablet Take 2,000 Units by mouth daily.    . Coenzyme Q10 (COQ-10) 100 MG CAPS Take 200 mg by mouth daily.    Marland Kitchen dexamethasone (DECADRON) 4 MG tablet Take 10 tablets (66m) once on day 22. Repeat every 28 days for the first 4 cycles. Take with breakfast. 40 tablet 4  . Eszopiclone 3 MG TABS Take 1 tablet  (3 mg total) by mouth at bedtime as needed (sleep). Take immediately before bedtime 30 tablet 0  . Insulin Pen Needle (NOVOFINE PLUS) 32G X 4 MM MISC Inject 1 pen into the skin daily. Inject 1 pen with Victoza SQ 100 each 3  . lenalidomide (REVLIMID) 25 MG capsule TAKE 1 CAPSULE BY MOUTH EVERY DAY FOR 21 DAYS ON THEN 7 DAYS OFF 21 capsule 0  . levocetirizine (XYZAL) 5 MG tablet TAKE 1 TABLET BY MOUTH EVERY DAY IN THE EVENING (Patient taking differently: Take 5 mg by mouth every evening.) 30 tablet 2  . lidocaine-prilocaine (EMLA) cream Apply to affected area once (Patient taking differently: Apply 1 application topically daily as needed (port access).) 30 g 3  . LORazepam (ATIVAN) 0.5 MG tablet TAKE 1 TABLET BY MOUTH EVERY 6 HOURS AS NEEDED (NAUSEA OR VOMITING). 30 tablet 0  . Multiple Vitamins-Minerals (IMMUNE SUPPORT PO) Take 2 tablets by mouth daily.    . ondansetron (ZOFRAN) 8 MG tablet Take 1 tablet (8 mg total) by mouth 2 (two) times daily as needed (Nausea or vomiting). 30 tablet 1  . Oxymetazoline HCl (AFRIN NODRIP SEVERE CONGEST NA) Place 1 spray into the nose daily as needed (congestion).    .Marland KitchenOZEMPIC, 0.25 OR 0.5 MG/DOSE, 2 MG/1.5ML SOPN INJECT 0.5 MG TOTAL INTO THE SKIN ONCE A WEEK. (Patient taking differently: Inject 0.5 mg as directed every Friday.) 4.5 mL 1  . prochlorperazine (COMPAZINE) 10 MG tablet Take 1 tablet (10 mg total) by mouth every 6 (six) hours as needed (Nausea or vomiting). 30 tablet 1  . senna (SENOKOT) 8.6 MG TABS tablet Take 17.2 mg by mouth daily as needed for mild constipation.    . Turmeric Curcumin 500 MG CAPS Take 500 mg by mouth daily.    . valACYclovir (VALTREX) 1000 MG tablet Take 1 tablet (1,000 mg total) by mouth daily. Patient taking it as needed (Patient taking differently: Take 1,000 mg by mouth daily.) 90 tablet 1  . vitamin B-12 (CYANOCOBALAMIN) 1000 MCG tablet Take 1,000 mcg by mouth daily.     No current facility-administered medications for this  visit.    REVIEW OF SYSTEMS:   10 Point review of Systems was done is negative except as noted above.  PHYSICAL EXAMINATION: ECOG PERFORMANCE STATUS: 1 - Symptomatic but completely ambulatory  . Vitals:   02/27/20 0910  BP: 126/81  Pulse: 68  Resp: 15  Temp: 97.6 F (36.4 C)  SpO2: 100%   Filed Weights   02/27/20 0910  Weight: 165 lb 9.6 oz (75.1 kg)   .Body mass index is 25.78 kg/m.    GENERAL:alert, in no acute distress and comfortable SKIN: no acute rashes, no significant lesions EYES: conjunctiva are pink and non-injected, sclera anicteric OROPHARYNX: MMM, no exudates, no oropharyngeal erythema or ulceration NECK: supple, no JVD LYMPH:  no palpable lymphadenopathy in the cervical, axillary or inguinal regions LUNGS: clear to auscultation b/l with normal respiratory effort HEART: regular rate & rhythm ABDOMEN:  normoactive bowel sounds , non tender, not distended. Extremity: no pedal edema PSYCH: alert & oriented x 3 with fluent speech NEURO: no focal motor/sensory deficits   LABORATORY DATA:  I have reviewed the data as listed  . CBC Latest Ref Rng & Units 02/27/2020 02/13/2020 02/06/2020  WBC 4.0 - 10.5 K/uL 3.7(L) 5.8 5.6  Hemoglobin 12.0 - 15.0 g/dL 10.8(L) 11.0(L) 11.3(L)  Hematocrit 36.0 - 46.0 % 32.5(L) 32.5(L) 33.9(L)  Platelets 150 - 400 K/uL 274 183 179    . CMP Latest Ref Rng & Units 02/27/2020 02/13/2020 02/06/2020  Glucose 70 - 99 mg/dL 104(H) 115(H) 99  BUN 6 - 20 mg/dL 12 12 13   Creatinine 0.44 - 1.00 mg/dL 0.75 0.83 0.81  Sodium 135 - 145 mmol/L 143 141 141  Potassium 3.5 - 5.1 mmol/L 4.1 3.9 3.8  Chloride 98 - 111 mmol/L 109 107 106  CO2 22 - 32 mmol/L 26 27 24   Calcium 8.9 - 10.3 mg/dL 8.7(L) 9.0 9.0  Total Protein 6.5 - 8.1 g/dL 5.9(L) 6.0(L) 6.1(L)  Total Bilirubin 0.3 - 1.2 mg/dL 1.4(H) 1.2 0.9  Alkaline Phos 38 - 126 U/L 49 54 56  AST 15 - 41 U/L 15 13(L) 17  ALT 0 - 44 U/L 13 20 25    11/08/2019 FISH Plasma Cell Myeloma Prognostic  Panel:    11/08/2019 Cytogenetics:    11/08/2019 Bone Marrow Report [id number]):   10/10/2019 Surgical Pathology [phone]):   RADIOGRAPHIC STUDIES:  I have personally reviewed the radiological images as listed and agreed with the findings in the report. No results found.  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 No M spike. 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 -Advised pt her last MMP showed her lambda light chains normalized and continuing no m-spike. -Discussed potential referral to Dr. Aris Lot at [facility] for transplant consideration. Will do this today. Discussedbroad logistics of HDT-Auto HSCT -Discussed levels of response-- >90% is VGPR, no active signs of Myeloma at this time..  -Recommend pt stay physically active, eat well, and drink 48-64 oz water daily. -Advised pt to use Lorazepam prn for sleeping aid.  -Advised pt to use controlled-release Ambien or Trazodone for resting. Will Rx Trazodone. -Advised pt that five cycles is the current goal, dependant on transplant if pt chooses. -Advised pt to f/u w PCP regarding Ozempic dosage -Will get repeat Bm Bx and PET prior to transplant or maintenance treatment.  -The pt has no prohibitive toxicities from continuing [place] at this time. Continue Carflizomib at 36 mg/m^2. -The pt has no prohibitive toxicities  from continuing 25 mg Revlimid 3 weeks on/1 week off. -Continue daily baby ASA, Valtrex, & B complex. -Continue Zometa q4weeks  -Will see back in 1 month with C5D1 with labs.   FOLLOW UP: -Referral to Dr. Aris Lot for  consideration of bone marrow transplant for high risk multiple myeloma -Continue Zometa every 4 weeks please schedule next 3 doses -Please schedule cycle 5 of KRD as per orders. -Port flush and labs with day 1, day 8 and day 15 every cycle. -Next MD visit in 4 weeks with cycle 5-day 1   The total time spent in the appointment was 30 minutes and more than 50% was on counseling and direct patient cares.  All of the patient's questions were answered with apparent satisfaction. The patient knows to call the clinic with any problems, questions or concerns.    Sullivan Lone MD [place] AAHIVMS [facility] Hematology/Oncology Physician [facility]  (Office):       [phone] (Work cell):  [phone] (Fax):           [phone]  02/26/2020 9:34 AM  I, Reinaldo Raddle, am acting as scribe for Dr. Sullivan Lone, MD.   .I have reviewed the above documentation for accuracy and completeness, and I agree with the above. Brunetta Genera MD

## 2020-02-27 ENCOUNTER — Inpatient Hospital Stay: Payer: 59

## 2020-02-27 ENCOUNTER — Inpatient Hospital Stay: Payer: 59 | Attending: Hematology

## 2020-02-27 ENCOUNTER — Other Ambulatory Visit: Payer: Self-pay

## 2020-02-27 ENCOUNTER — Inpatient Hospital Stay (HOSPITAL_BASED_OUTPATIENT_CLINIC_OR_DEPARTMENT_OTHER): Payer: 59 | Admitting: Hematology

## 2020-02-27 VITALS — BP 126/81 | HR 68 | Temp 97.6°F | Resp 15 | Ht 67.2 in | Wt 165.6 lb

## 2020-02-27 DIAGNOSIS — C9000 Multiple myeloma not having achieved remission: Secondary | ICD-10-CM

## 2020-02-27 DIAGNOSIS — Z95828 Presence of other vascular implants and grafts: Secondary | ICD-10-CM

## 2020-02-27 DIAGNOSIS — C7951 Secondary malignant neoplasm of bone: Secondary | ICD-10-CM

## 2020-02-27 DIAGNOSIS — Z5112 Encounter for antineoplastic immunotherapy: Secondary | ICD-10-CM | POA: Insufficient documentation

## 2020-02-27 DIAGNOSIS — Z7189 Other specified counseling: Secondary | ICD-10-CM

## 2020-02-27 LAB — CMP (CANCER CENTER ONLY)
ALT: 13 U/L (ref 0–44)
AST: 15 U/L (ref 15–41)
Albumin: 3.7 g/dL (ref 3.5–5.0)
Alkaline Phosphatase: 49 U/L (ref 38–126)
Anion gap: 8 (ref 5–15)
BUN: 12 mg/dL (ref 6–20)
CO2: 26 mmol/L (ref 22–32)
Calcium: 8.7 mg/dL — ABNORMAL LOW (ref 8.9–10.3)
Chloride: 109 mmol/L (ref 98–111)
Creatinine: 0.75 mg/dL (ref 0.44–1.00)
GFR, Estimated: >60 mL/min (ref >60)
Glucose, Bld: 104 mg/dL — ABNORMAL HIGH (ref 70–99)
Potassium: 4.1 mmol/L (ref 3.5–5.1)
Sodium: 143 mmol/L (ref 135–145)
Total Bilirubin: 1.4 mg/dL — ABNORMAL HIGH (ref 0.3–1.2)
Total Protein: 5.9 g/dL — ABNORMAL LOW (ref 6.5–8.1)

## 2020-02-27 LAB — CBC WITH DIFFERENTIAL/PLATELET
Abs Immature Granulocytes: 0.01 10*3/uL (ref 0.00–0.07)
Basophils Absolute: 0.1 10*3/uL (ref 0.0–0.1)
Basophils Relative: 1 %
Eosinophils Absolute: 0.1 10*3/uL (ref 0.0–0.5)
Eosinophils Relative: 4 %
HCT: 32.5 % — ABNORMAL LOW (ref 36.0–46.0)
Hemoglobin: 10.8 g/dL — ABNORMAL LOW (ref 12.0–15.0)
Immature Granulocytes: 0 %
Lymphocytes Relative: 42 %
Lymphs Abs: 1.6 10*3/uL (ref 0.7–4.0)
MCH: 30.8 pg (ref 26.0–34.0)
MCHC: 33.2 g/dL (ref 30.0–36.0)
MCV: 92.6 fL (ref 80.0–100.0)
Monocytes Absolute: 0.5 10*3/uL (ref 0.1–1.0)
Monocytes Relative: 12 %
Neutro Abs: 1.5 10*3/uL — ABNORMAL LOW (ref 1.7–7.7)
Neutrophils Relative %: 41 %
Platelets: 274 10*3/uL (ref 150–400)
RBC: 3.51 MIL/uL — ABNORMAL LOW (ref 3.87–5.11)
RDW: 17.6 % — ABNORMAL HIGH (ref 11.5–15.5)
WBC: 3.7 10*3/uL — ABNORMAL LOW (ref 4.0–10.5)
nRBC: 0 % (ref 0.0–0.2)

## 2020-02-27 MED ORDER — DEXTROSE 5 % IV SOLN
36.0000 mg/m2 | Freq: Once | INTRAVENOUS | Status: AC
  Administered 2020-02-27: 70 mg via INTRAVENOUS
  Filled 2020-02-27: qty 30

## 2020-02-27 MED ORDER — SODIUM CHLORIDE 0.9 % IV SOLN
Freq: Once | INTRAVENOUS | Status: AC
  Filled 2020-02-27: qty 250

## 2020-02-27 MED ORDER — ZOLEDRONIC ACID 4 MG/100ML IV SOLN
4.0000 mg | Freq: Once | INTRAVENOUS | Status: AC
  Administered 2020-02-27: 4 mg via INTRAVENOUS

## 2020-02-27 MED ORDER — SODIUM CHLORIDE 0.9% FLUSH
10.0000 mL | Freq: Once | INTRAVENOUS | Status: AC
  Administered 2020-02-27: 10 mL
  Filled 2020-02-27: qty 10

## 2020-02-27 MED ORDER — ZOLEDRONIC ACID 4 MG/100ML IV SOLN
INTRAVENOUS | Status: AC
  Filled 2020-02-27: qty 100

## 2020-02-27 MED ORDER — SODIUM CHLORIDE 0.9% FLUSH
10.0000 mL | INTRAVENOUS | Status: DC | PRN
  Administered 2020-02-27: 10 mL
  Filled 2020-02-27: qty 10

## 2020-02-27 MED ORDER — SODIUM CHLORIDE 0.9 % IV SOLN
20.0000 mg | Freq: Once | INTRAVENOUS | Status: AC
  Administered 2020-02-27: 20 mg via INTRAVENOUS
  Filled 2020-02-27: qty 20

## 2020-02-27 MED ORDER — TRAZODONE HCL 50 MG PO TABS: 50.0000 mg | ORAL_TABLET | Freq: Every evening | ORAL | 1 refills | Status: DC | PRN

## 2020-02-27 MED ORDER — HEPARIN SOD (PORK) LOCK FLUSH 100 UNIT/ML IV SOLN
500.0000 [IU] | Freq: Once | INTRAVENOUS | Status: AC | PRN
  Administered 2020-02-27: 500 [IU]
  Filled 2020-02-27: qty 5

## 2020-02-27 NOTE — Patient Instructions (Signed)

## 2020-02-27 NOTE — Patient Instructions (Signed)
[facility] Discharge Instructions for Patients Receiving Chemotherapy  Today you received the following chemotherapy agents Carfilzomib (KYPROLIS).  To help prevent nausea and vomiting after your treatment, we encourage you to take your nausea medication as prescribed.  If you develop nausea and vomiting that is not controlled by your nausea medication, call the clinic.   BELOW ARE SYMPTOMS THAT SHOULD BE REPORTED IMMEDIATELY:  *FEVER GREATER THAN 100.5 F  *CHILLS WITH OR WITHOUT FEVER  NAUSEA AND VOMITING THAT IS NOT CONTROLLED WITH YOUR NAUSEA MEDICATION  *UNUSUAL SHORTNESS OF BREATH  *UNUSUAL BRUISING OR BLEEDING  TENDERNESS IN MOUTH AND THROAT WITH OR WITHOUT PRESENCE OF ULCERS  *URINARY PROBLEMS  *BOWEL PROBLEMS  UNUSUAL RASH Items with * indicate a potential emergency and should be followed up as soon as possible.  Feel free to call the clinic should you have any questions or concerns. The clinic phone number is [phone].  Please show the CHEMO ALERT CARD at check-in to the Emergency Department and triage nurse.   

## 2020-02-28 ENCOUNTER — Telehealth: Payer: Self-pay | Admitting: Hematology

## 2020-02-28 ENCOUNTER — Telehealth: Payer: Self-pay | Admitting: *Deleted

## 2020-02-28 ENCOUNTER — Inpatient Hospital Stay: Payer: 59

## 2020-02-28 ENCOUNTER — Other Ambulatory Visit: Payer: Self-pay

## 2020-02-28 VITALS — BP 156/85 | HR 65 | Temp 98.3°F | Resp 18

## 2020-02-28 DIAGNOSIS — Z5112 Encounter for antineoplastic immunotherapy: Secondary | ICD-10-CM | POA: Diagnosis not present

## 2020-02-28 DIAGNOSIS — C9000 Multiple myeloma not having achieved remission: Secondary | ICD-10-CM

## 2020-02-28 DIAGNOSIS — C7951 Secondary malignant neoplasm of bone: Secondary | ICD-10-CM

## 2020-02-28 DIAGNOSIS — Z7189 Other specified counseling: Secondary | ICD-10-CM

## 2020-02-28 MED ORDER — PROCHLORPERAZINE MALEATE 10 MG PO TABS
ORAL_TABLET | ORAL | Status: AC
  Filled 2020-02-28: qty 1

## 2020-02-28 MED ORDER — SODIUM CHLORIDE 0.9 % IV SOLN
Freq: Once | INTRAVENOUS | Status: AC
  Filled 2020-02-28: qty 250

## 2020-02-28 MED ORDER — PROCHLORPERAZINE MALEATE 10 MG PO TABS
10.0000 mg | ORAL_TABLET | Freq: Once | ORAL | Status: AC
  Administered 2020-02-28: 10 mg via ORAL

## 2020-02-28 MED ORDER — SODIUM CHLORIDE 0.9% FLUSH
10.0000 mL | INTRAVENOUS | Status: DC | PRN
  Administered 2020-02-28: 10 mL
  Filled 2020-02-28: qty 10

## 2020-02-28 MED ORDER — DEXTROSE 5 % IV SOLN
36.0000 mg/m2 | Freq: Once | INTRAVENOUS | Status: AC
  Administered 2020-02-28: 70 mg via INTRAVENOUS
  Filled 2020-02-28: qty 30

## 2020-02-28 MED ORDER — HEPARIN SOD (PORK) LOCK FLUSH 100 UNIT/ML IV SOLN
500.0000 [IU] | Freq: Once | INTRAVENOUS | Status: AC | PRN
  Administered 2020-02-28: 500 [IU]
  Filled 2020-02-28: qty 5

## 2020-02-28 NOTE — Patient Instructions (Signed)
[facility] Discharge Instructions for Patients Receiving Chemotherapy  Today you received the following chemotherapy agents carflizomib  To help prevent nausea and vomiting after your treatment, we encourage you to take your nausea medication as directed.   If you develop nausea and vomiting that is not controlled by your nausea medication, call the clinic.   BELOW ARE SYMPTOMS THAT SHOULD BE REPORTED IMMEDIATELY:  *FEVER GREATER THAN 100.5 F  *CHILLS WITH OR WITHOUT FEVER  NAUSEA AND VOMITING THAT IS NOT CONTROLLED WITH YOUR NAUSEA MEDICATION  *UNUSUAL SHORTNESS OF BREATH  *UNUSUAL BRUISING OR BLEEDING  TENDERNESS IN MOUTH AND THROAT WITH OR WITHOUT PRESENCE OF ULCERS  *URINARY PROBLEMS  *BOWEL PROBLEMS  UNUSUAL RASH Items with * indicate a potential emergency and should be followed up as soon as possible.  Feel free to call the clinic should you have any questions or concerns. The clinic phone number is [phone].  Please show the [place] at check-in to the Emergency Department and triage nurse.

## 2020-02-28 NOTE — Telephone Encounter (Signed)
Release: [id number]  Released records to Dr.Lambird to [phone]

## 2020-02-28 NOTE — Telephone Encounter (Signed)
Per Dr. Grier Mitts orders - faxed referral for consideration of HDT-autologus bone marrow transplant (included patient demographics, most recent OV note) to [facility] Heme-Onc - Dr. Aris Lot. Faxed to [phone] - fax confirmation received

## 2020-03-05 ENCOUNTER — Inpatient Hospital Stay: Payer: 59

## 2020-03-05 ENCOUNTER — Other Ambulatory Visit: Payer: Self-pay

## 2020-03-05 VITALS — BP 142/83 | HR 63 | Temp 98.3°F | Resp 18 | Ht 67.0 in | Wt 171.5 lb

## 2020-03-05 DIAGNOSIS — C9000 Multiple myeloma not having achieved remission: Secondary | ICD-10-CM

## 2020-03-05 DIAGNOSIS — C7951 Secondary malignant neoplasm of bone: Secondary | ICD-10-CM

## 2020-03-05 DIAGNOSIS — Z5112 Encounter for antineoplastic immunotherapy: Secondary | ICD-10-CM | POA: Diagnosis not present

## 2020-03-05 DIAGNOSIS — Z95828 Presence of other vascular implants and grafts: Secondary | ICD-10-CM

## 2020-03-05 DIAGNOSIS — Z7189 Other specified counseling: Secondary | ICD-10-CM

## 2020-03-05 LAB — CBC WITH DIFFERENTIAL/PLATELET
Abs Immature Granulocytes: 0.02 10*3/uL (ref 0.00–0.07)
Basophils Absolute: 0 10*3/uL (ref 0.0–0.1)
Basophils Relative: 0 %
Eosinophils Absolute: 0.3 10*3/uL (ref 0.0–0.5)
Eosinophils Relative: 6 %
HCT: 30 % — ABNORMAL LOW (ref 36.0–46.0)
Hemoglobin: 10.2 g/dL — ABNORMAL LOW (ref 12.0–15.0)
Immature Granulocytes: 0 %
Lymphocytes Relative: 20 %
Lymphs Abs: 1 10*3/uL (ref 0.7–4.0)
MCH: 31.7 pg (ref 26.0–34.0)
MCHC: 34 g/dL (ref 30.0–36.0)
MCV: 93.2 fL (ref 80.0–100.0)
Monocytes Absolute: 0.4 10*3/uL (ref 0.1–1.0)
Monocytes Relative: 8 %
Neutro Abs: 3.1 10*3/uL (ref 1.7–7.7)
Neutrophils Relative %: 66 %
Platelets: 146 10*3/uL — ABNORMAL LOW (ref 150–400)
RBC: 3.22 MIL/uL — ABNORMAL LOW (ref 3.87–5.11)
RDW: 16.4 % — ABNORMAL HIGH (ref 11.5–15.5)
WBC: 4.8 10*3/uL (ref 4.0–10.5)
nRBC: 0 % (ref 0.0–0.2)

## 2020-03-05 LAB — CMP (CANCER CENTER ONLY)
ALT: 13 U/L (ref 0–44)
AST: 15 U/L (ref 15–41)
Albumin: 3.5 g/dL (ref 3.5–5.0)
Alkaline Phosphatase: 45 U/L (ref 38–126)
Anion gap: 5 (ref 5–15)
BUN: 7 mg/dL (ref 6–20)
CO2: 27 mmol/L (ref 22–32)
Calcium: 8.5 mg/dL — ABNORMAL LOW (ref 8.9–10.3)
Chloride: 110 mmol/L (ref 98–111)
Creatinine: 0.77 mg/dL (ref 0.44–1.00)
GFR, Estimated: >60 mL/min (ref >60)
Glucose, Bld: 80 mg/dL (ref 70–99)
Potassium: 3.9 mmol/L (ref 3.5–5.1)
Sodium: 142 mmol/L (ref 135–145)
Total Bilirubin: 1 mg/dL (ref 0.3–1.2)
Total Protein: 5.5 g/dL — ABNORMAL LOW (ref 6.5–8.1)

## 2020-03-05 MED ORDER — SODIUM CHLORIDE 0.9 % IV SOLN
20.0000 mg | Freq: Once | INTRAVENOUS | Status: AC
  Administered 2020-03-05: 20 mg via INTRAVENOUS
  Filled 2020-03-05: qty 20

## 2020-03-05 MED ORDER — DEXTROSE 5 % IV SOLN
36.0000 mg/m2 | Freq: Once | INTRAVENOUS | Status: AC
  Administered 2020-03-05: 70 mg via INTRAVENOUS
  Filled 2020-03-05: qty 30

## 2020-03-05 MED ORDER — SODIUM CHLORIDE 0.9 % IV SOLN
Freq: Once | INTRAVENOUS | Status: AC
  Filled 2020-03-05: qty 250

## 2020-03-05 MED ORDER — SODIUM CHLORIDE 0.9% FLUSH
10.0000 mL | INTRAVENOUS | Status: DC | PRN
  Administered 2020-03-05: 10 mL
  Filled 2020-03-05: qty 10

## 2020-03-05 MED ORDER — HEPARIN SOD (PORK) LOCK FLUSH 100 UNIT/ML IV SOLN
500.0000 [IU] | Freq: Once | INTRAVENOUS | Status: AC | PRN
  Administered 2020-03-05: 500 [IU]
  Filled 2020-03-05: qty 5

## 2020-03-05 MED ORDER — SODIUM CHLORIDE 0.9% FLUSH
10.0000 mL | Freq: Once | INTRAVENOUS | Status: AC
  Administered 2020-03-05: 10 mL
  Filled 2020-03-05: qty 10

## 2020-03-05 NOTE — Patient Instructions (Signed)
Implanted Port Insertion, Care After This sheet gives you information about how to care for yourself after your procedure. Your health care provider may also give you more specific instructions. If you have problems or questions, contact your health care provider. What can I expect after the procedure? After the procedure, it is common to have:  Discomfort at the port insertion site.  Bruising on the skin over the port. This should improve over 3-4 days. Follow these instructions at home: Port care  After your port is placed, you will get a manufacturer's information card. The card has information about your port. Keep this card with you at all times.  Take care of the port as told by your health care provider. Ask your health care provider if you or a family member can get training for taking care of the port at home. A home health care nurse may also take care of the port.  Make sure to remember what type of port you have. Incision care  Follow instructions from your health care provider about how to take care of your port insertion site. Make sure you: ? Wash your hands with soap and water before and after you change your bandage (dressing). If soap and water are not available, use hand sanitizer. ? Change your dressing as told by your health care provider. ? Leave stitches (sutures), skin glue, or adhesive strips in place. These skin closures may need to stay in place for 2 weeks or longer. If adhesive strip edges start to loosen and curl up, you may trim the loose edges. Do not remove adhesive strips completely unless your health care provider tells you to do that.  Check your port insertion site every day for signs of infection. Check for: ? Redness, swelling, or pain. ? Fluid or blood. ? Warmth. ? Pus or a bad smell.      Activity  Return to your normal activities as told by your health care provider. Ask your health care provider what activities are safe for you.  Do not  lift anything that is heavier than 10 lb (4.5 kg), or the limit that you are told, until your health care provider says that it is safe. General instructions  Take over-the-counter and prescription medicines only as told by your health care provider.  Do not take baths, swim, or use a hot tub until your health care provider approves. Ask your health care provider if you may take showers. You may only be allowed to take sponge baths.  Do not drive for 24 hours if you were given a sedative during your procedure.  Wear a medical alert bracelet in case of an emergency. This will tell any health care providers that you have a port.  Keep all follow-up visits as told by your health care provider. This is important. Contact a health care provider if:  You cannot flush your port with saline as directed, or you cannot draw blood from the port.  You have a fever or chills.  You have redness, swelling, or pain around your port insertion site.  You have fluid or blood coming from your port insertion site.  Your port insertion site feels warm to the touch.  You have pus or a bad smell coming from the port insertion site. Get help right away if:  You have chest pain or shortness of breath.  You have bleeding from your port that you cannot control. Summary  Take care of the port as told by your   health care provider. Keep the manufacturer's information card with you at all times.  Change your dressing as told by your health care provider.  Contact a health care provider if you have a fever or chills or if you have redness, swelling, or pain around your port insertion site.  Keep all follow-up visits as told by your health care provider. This information is not intended to replace advice given to you by your health care provider. Make sure you discuss any questions you have with your health care provider. Document Revised: 08/04/2017 Document Reviewed: 08/04/2017 Elsevier Patient Education   2021 Elsevier Inc.  

## 2020-03-05 NOTE — Patient Instructions (Signed)
[facility] Discharge Instructions for Patients Receiving Chemotherapy  Today you received the following chemotherapy agents Carflizomib  To help prevent nausea and vomiting after your treatment, we encourage you to take your nausea medication as directed.   If you develop nausea and vomiting that is not controlled by your nausea medication, call the clinic.   BELOW ARE SYMPTOMS THAT SHOULD BE REPORTED IMMEDIATELY:  *FEVER GREATER THAN 100.5 F  *CHILLS WITH OR WITHOUT FEVER  NAUSEA AND VOMITING THAT IS NOT CONTROLLED WITH YOUR NAUSEA MEDICATION  *UNUSUAL SHORTNESS OF BREATH  *UNUSUAL BRUISING OR BLEEDING  TENDERNESS IN MOUTH AND THROAT WITH OR WITHOUT PRESENCE OF ULCERS  *URINARY PROBLEMS  *BOWEL PROBLEMS  UNUSUAL RASH Items with * indicate a potential emergency and should be followed up as soon as possible.  Feel free to call the clinic should you have any questions or concerns. The clinic phone number is [phone].  Please show the [place] at check-in to the Emergency Department and triage nurse.

## 2020-03-06 ENCOUNTER — Inpatient Hospital Stay: Payer: 59

## 2020-03-06 ENCOUNTER — Other Ambulatory Visit: Payer: Self-pay

## 2020-03-06 VITALS — BP 144/81 | HR 61 | Temp 98.8°F | Resp 18

## 2020-03-06 DIAGNOSIS — C7951 Secondary malignant neoplasm of bone: Secondary | ICD-10-CM

## 2020-03-06 DIAGNOSIS — C9000 Multiple myeloma not having achieved remission: Secondary | ICD-10-CM

## 2020-03-06 DIAGNOSIS — Z7189 Other specified counseling: Secondary | ICD-10-CM

## 2020-03-06 DIAGNOSIS — Z5112 Encounter for antineoplastic immunotherapy: Secondary | ICD-10-CM | POA: Diagnosis not present

## 2020-03-06 LAB — KAPPA/LAMBDA LIGHT CHAINS
Kappa free light chain: 11.4 mg/L (ref 3.3–19.4)
Kappa, lambda light chain ratio: 0.6 (ref 0.26–1.65)
Lambda free light chains: 19 mg/L (ref 5.7–26.3)

## 2020-03-06 MED ORDER — SODIUM CHLORIDE 0.9 % IV SOLN
Freq: Once | INTRAVENOUS | Status: AC
  Filled 2020-03-06: qty 250

## 2020-03-06 MED ORDER — SODIUM CHLORIDE 0.9% FLUSH
10.0000 mL | INTRAVENOUS | Status: DC | PRN
  Administered 2020-03-06: 10 mL
  Filled 2020-03-06: qty 10

## 2020-03-06 MED ORDER — PROCHLORPERAZINE MALEATE 10 MG PO TABS
ORAL_TABLET | ORAL | Status: AC
  Filled 2020-03-06: qty 1

## 2020-03-06 MED ORDER — HEPARIN SOD (PORK) LOCK FLUSH 100 UNIT/ML IV SOLN
500.0000 [IU] | Freq: Once | INTRAVENOUS | Status: AC | PRN
  Administered 2020-03-06: 500 [IU]
  Filled 2020-03-06: qty 5

## 2020-03-06 MED ORDER — PROCHLORPERAZINE MALEATE 10 MG PO TABS
10.0000 mg | ORAL_TABLET | Freq: Once | ORAL | Status: AC
  Administered 2020-03-06: 10 mg via ORAL

## 2020-03-06 MED ORDER — DEXTROSE 5 % IV SOLN
36.0000 mg/m2 | Freq: Once | INTRAVENOUS | Status: AC
  Administered 2020-03-06: 70 mg via INTRAVENOUS
  Filled 2020-03-06: qty 30

## 2020-03-06 NOTE — Patient Instructions (Signed)
[facility] Discharge Instructions for Patients Receiving Chemotherapy  Today you received the following chemotherapy agents Carflizomib  To help prevent nausea and vomiting after your treatment, we encourage you to take your nausea medication as directed.   If you develop nausea and vomiting that is not controlled by your nausea medication, call the clinic.   BELOW ARE SYMPTOMS THAT SHOULD BE REPORTED IMMEDIATELY:  *FEVER GREATER THAN 100.5 F  *CHILLS WITH OR WITHOUT FEVER  NAUSEA AND VOMITING THAT IS NOT CONTROLLED WITH YOUR NAUSEA MEDICATION  *UNUSUAL SHORTNESS OF BREATH  *UNUSUAL BRUISING OR BLEEDING  TENDERNESS IN MOUTH AND THROAT WITH OR WITHOUT PRESENCE OF ULCERS  *URINARY PROBLEMS  *BOWEL PROBLEMS  UNUSUAL RASH Items with * indicate a potential emergency and should be followed up as soon as possible.  Feel free to call the clinic should you have any questions or concerns. The clinic phone number is [phone].  Please show the [place] at check-in to the Emergency Department and triage nurse.

## 2020-03-08 ENCOUNTER — Other Ambulatory Visit: Payer: Self-pay | Admitting: Hematology

## 2020-03-08 ENCOUNTER — Telehealth: Payer: Self-pay | Admitting: *Deleted

## 2020-03-08 DIAGNOSIS — C9000 Multiple myeloma not having achieved remission: Secondary | ICD-10-CM

## 2020-03-08 DIAGNOSIS — Z7189 Other specified counseling: Secondary | ICD-10-CM

## 2020-03-08 NOTE — Telephone Encounter (Signed)
Contacted by Harland German - with transplant team. Theadora Rama states patient had contacted them to inquire about bone marrow transplant after being turned down due to out of network at another facility. They need ins info. She faxed form to this office and requested form completed and copy of patient insurance card  [facility] - Patient not in network to have Bone marrow transplant eval at [facility] per Transplant coordinator. Advised patient of same  - she verbalized understanding and said she had called [facility].   Patient gave verbal Josem Kaufmann to share insurance info with [facility] to find out if she is in network there. She said if not, she has received some other names of physicians from her insurance company.  Form completed and faxed to [facility] @ [phone] [facility]. Fax confirmation received

## 2020-03-12 ENCOUNTER — Inpatient Hospital Stay: Payer: 59

## 2020-03-12 ENCOUNTER — Other Ambulatory Visit: Payer: Self-pay

## 2020-03-12 VITALS — BP 146/94 | HR 62 | Temp 98.7°F | Resp 18

## 2020-03-12 DIAGNOSIS — C7951 Secondary malignant neoplasm of bone: Secondary | ICD-10-CM

## 2020-03-12 DIAGNOSIS — C9000 Multiple myeloma not having achieved remission: Secondary | ICD-10-CM

## 2020-03-12 DIAGNOSIS — Z7189 Other specified counseling: Secondary | ICD-10-CM

## 2020-03-12 DIAGNOSIS — Z5112 Encounter for antineoplastic immunotherapy: Secondary | ICD-10-CM | POA: Diagnosis not present

## 2020-03-12 LAB — CBC WITH DIFFERENTIAL/PLATELET
Abs Immature Granulocytes: 0.01 10*3/uL (ref 0.00–0.07)
Basophils Absolute: 0 10*3/uL (ref 0.0–0.1)
Basophils Relative: 1 %
Eosinophils Absolute: 0.3 10*3/uL (ref 0.0–0.5)
Eosinophils Relative: 8 %
HCT: 33.1 % — ABNORMAL LOW (ref 36.0–46.0)
Hemoglobin: 10.9 g/dL — ABNORMAL LOW (ref 12.0–15.0)
Immature Granulocytes: 0 %
Lymphocytes Relative: 25 %
Lymphs Abs: 1.1 10*3/uL (ref 0.7–4.0)
MCH: 30.6 pg (ref 26.0–34.0)
MCHC: 32.9 g/dL (ref 30.0–36.0)
MCV: 93 fL (ref 80.0–100.0)
Monocytes Absolute: 0.8 10*3/uL (ref 0.1–1.0)
Monocytes Relative: 18 %
Neutro Abs: 2.1 10*3/uL (ref 1.7–7.7)
Neutrophils Relative %: 48 %
Platelets: 192 10*3/uL (ref 150–400)
RBC: 3.56 MIL/uL — ABNORMAL LOW (ref 3.87–5.11)
RDW: 15.5 % (ref 11.5–15.5)
WBC: 4.4 10*3/uL (ref 4.0–10.5)
nRBC: 0 % (ref 0.0–0.2)

## 2020-03-12 LAB — CMP (CANCER CENTER ONLY)
ALT: 13 U/L (ref 0–44)
AST: 13 U/L — ABNORMAL LOW (ref 15–41)
Albumin: 3.8 g/dL (ref 3.5–5.0)
Alkaline Phosphatase: 46 U/L (ref 38–126)
Anion gap: 6 (ref 5–15)
BUN: 13 mg/dL (ref 6–20)
CO2: 27 mmol/L (ref 22–32)
Calcium: 8.8 mg/dL — ABNORMAL LOW (ref 8.9–10.3)
Chloride: 108 mmol/L (ref 98–111)
Creatinine: 0.79 mg/dL (ref 0.44–1.00)
GFR, Estimated: >60 mL/min (ref >60)
Glucose, Bld: 88 mg/dL (ref 70–99)
Potassium: 3.7 mmol/L (ref 3.5–5.1)
Sodium: 141 mmol/L (ref 135–145)
Total Bilirubin: 0.9 mg/dL (ref 0.3–1.2)
Total Protein: 6.2 g/dL — ABNORMAL LOW (ref 6.5–8.1)

## 2020-03-12 MED ORDER — SODIUM CHLORIDE 0.9% FLUSH
10.0000 mL | INTRAVENOUS | Status: DC | PRN
  Administered 2020-03-12: 10 mL
  Filled 2020-03-12: qty 10

## 2020-03-12 MED ORDER — SODIUM CHLORIDE 0.9 % IV SOLN
20.0000 mg | Freq: Once | INTRAVENOUS | Status: AC
  Administered 2020-03-12: 20 mg via INTRAVENOUS
  Filled 2020-03-12: qty 20

## 2020-03-12 MED ORDER — HEPARIN SOD (PORK) LOCK FLUSH 100 UNIT/ML IV SOLN
500.0000 [IU] | Freq: Once | INTRAVENOUS | Status: AC | PRN
  Administered 2020-03-12: 500 [IU]
  Filled 2020-03-12: qty 5

## 2020-03-12 MED ORDER — SODIUM CHLORIDE 0.9 % IV SOLN
Freq: Once | INTRAVENOUS | Status: AC
  Filled 2020-03-12: qty 250

## 2020-03-12 MED ORDER — DEXTROSE 5 % IV SOLN
36.0000 mg/m2 | Freq: Once | INTRAVENOUS | Status: AC
  Administered 2020-03-12: 70 mg via INTRAVENOUS
  Filled 2020-03-12: qty 30

## 2020-03-12 NOTE — Patient Instructions (Signed)
[place] Discharge Instructions for Patients Receiving Chemotherapy  Today you received the following chemotherapy agents Carflizomib  To help prevent nausea and vomiting after your treatment, we encourage you to take your nausea medication as directed.   If you develop nausea and vomiting that is not controlled by your nausea medication, call the clinic.   BELOW ARE SYMPTOMS THAT SHOULD BE REPORTED IMMEDIATELY:  *FEVER GREATER THAN 100.5 F  *CHILLS WITH OR WITHOUT FEVER  NAUSEA AND VOMITING THAT IS NOT CONTROLLED WITH YOUR NAUSEA MEDICATION  *UNUSUAL SHORTNESS OF BREATH  *UNUSUAL BRUISING OR BLEEDING  TENDERNESS IN MOUTH AND THROAT WITH OR WITHOUT PRESENCE OF ULCERS  *URINARY PROBLEMS  *BOWEL PROBLEMS  UNUSUAL RASH Items with * indicate a potential emergency and should be followed up as soon as possible.  Feel free to call the clinic should you have any questions or concerns. The clinic phone number is [phone].  Please show the [place] at check-in to the Emergency Department and triage nurse.

## 2020-03-13 ENCOUNTER — Inpatient Hospital Stay: Payer: 59

## 2020-03-13 ENCOUNTER — Other Ambulatory Visit: Payer: Self-pay

## 2020-03-13 ENCOUNTER — Telehealth: Payer: Self-pay | Admitting: *Deleted

## 2020-03-13 ENCOUNTER — Other Ambulatory Visit: Payer: Self-pay | Admitting: Hematology

## 2020-03-13 VITALS — BP 142/95 | HR 62 | Temp 98.6°F | Resp 18

## 2020-03-13 DIAGNOSIS — C9000 Multiple myeloma not having achieved remission: Secondary | ICD-10-CM

## 2020-03-13 DIAGNOSIS — Z7189 Other specified counseling: Secondary | ICD-10-CM

## 2020-03-13 DIAGNOSIS — C7951 Secondary malignant neoplasm of bone: Secondary | ICD-10-CM

## 2020-03-13 DIAGNOSIS — Z5112 Encounter for antineoplastic immunotherapy: Secondary | ICD-10-CM | POA: Diagnosis not present

## 2020-03-13 LAB — KAPPA/LAMBDA LIGHT CHAINS
Kappa free light chain: 12.9 mg/L (ref 3.3–19.4)
Kappa, lambda light chain ratio: 0.63 (ref 0.26–1.65)
Lambda free light chains: 20.6 mg/L (ref 5.7–26.3)

## 2020-03-13 MED ORDER — HEPARIN SOD (PORK) LOCK FLUSH 100 UNIT/ML IV SOLN
500.0000 [IU] | Freq: Once | INTRAVENOUS | Status: AC | PRN
  Administered 2020-03-13: 500 [IU]
  Filled 2020-03-13: qty 5

## 2020-03-13 MED ORDER — SODIUM CHLORIDE 0.9 % IV SOLN
Freq: Once | INTRAVENOUS | Status: AC
  Filled 2020-03-13: qty 250

## 2020-03-13 MED ORDER — PROCHLORPERAZINE MALEATE 10 MG PO TABS
ORAL_TABLET | ORAL | Status: AC
  Filled 2020-03-13: qty 1

## 2020-03-13 MED ORDER — DEXTROSE 5 % IV SOLN
36.0000 mg/m2 | Freq: Once | INTRAVENOUS | Status: AC
  Administered 2020-03-13: 70 mg via INTRAVENOUS
  Filled 2020-03-13: qty 30

## 2020-03-13 MED ORDER — PROCHLORPERAZINE MALEATE 10 MG PO TABS
10.0000 mg | ORAL_TABLET | Freq: Once | ORAL | Status: AC
  Administered 2020-03-13: 10 mg via ORAL

## 2020-03-13 MED ORDER — SODIUM CHLORIDE 0.9% FLUSH
10.0000 mL | INTRAVENOUS | Status: DC | PRN
  Administered 2020-03-13: 10 mL
  Filled 2020-03-13: qty 10

## 2020-03-13 NOTE — Patient Instructions (Signed)
[facility] Discharge Instructions for Patients Receiving Chemotherapy  Today you received the following chemotherapy agents:  Kyprolis  To help prevent nausea and vomiting after your treatment, we encourage you to take your nausea medication as directed.   If you develop nausea and vomiting that is not controlled by your nausea medication, call the clinic.   BELOW ARE SYMPTOMS THAT SHOULD BE REPORTED IMMEDIATELY:  *FEVER GREATER THAN 100.5 F  *CHILLS WITH OR WITHOUT FEVER  NAUSEA AND VOMITING THAT IS NOT CONTROLLED WITH YOUR NAUSEA MEDICATION  *UNUSUAL SHORTNESS OF BREATH  *UNUSUAL BRUISING OR BLEEDING  TENDERNESS IN MOUTH AND THROAT WITH OR WITHOUT PRESENCE OF ULCERS  *URINARY PROBLEMS  *BOWEL PROBLEMS  UNUSUAL RASH Items with * indicate a potential emergency and should be followed up as soon as possible.  Feel free to call the clinic should you have any questions or concerns. The clinic phone number is [phone].  Please show the CHEMO ALERT CARD at check-in to the Emergency Department and triage nurse.   

## 2020-03-13 NOTE — Telephone Encounter (Signed)
Per Dr. 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 Alvie Heidelberg. Fax confirmation received

## 2020-03-14 LAB — MULTIPLE MYELOMA PANEL, SERUM
Albumin SerPl Elph-Mcnc: 3.5 g/dL (ref 2.9–4.4)
Albumin/Glob SerPl: 1.6 (ref 0.7–1.7)
Alpha 1: 0.2 g/dL (ref 0.0–0.4)
Alpha2 Glob SerPl Elph-Mcnc: 0.6 g/dL (ref 0.4–1.0)
B-Globulin SerPl Elph-Mcnc: 0.9 g/dL (ref 0.7–1.3)
Gamma Glob SerPl Elph-Mcnc: 0.5 g/dL (ref 0.4–1.8)
Globulin, Total: 2.3 g/dL (ref 2.2–3.9)
IgA: 53 mg/dL — ABNORMAL LOW (ref 87–352)
IgG (Immunoglobin G), Serum: 545 mg/dL — ABNORMAL LOW (ref 586–1602)
IgM (Immunoglobulin M), Srm: 30 mg/dL (ref 26–217)
Total Protein ELP: 5.8 g/dL — ABNORMAL LOW (ref 6.0–8.5)

## 2020-03-21 ENCOUNTER — Other Ambulatory Visit: Payer: Self-pay | Admitting: Hematology

## 2020-03-25 ENCOUNTER — Other Ambulatory Visit: Payer: Self-pay | Admitting: Nurse Practitioner

## 2020-03-25 NOTE — Progress Notes (Signed)
HEMATOLOGY/ONCOLOGY CONSULTATION NOTE  Date of Service: 03/26/2020  Patient Care Team: [name], FNP as PCP - General (General Practice)  CHIEF COMPLAINTS/PURPOSE OF CONSULTATION:  Multiple myeloma- continued mx  HISTORY OF PRESENTING ILLNESS:   Alexandra [name] is a wonderful 55 y.o. female who has been referred to [place] by Dr. 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 The pt reports that she is doing well overall.   The pt reports that she was taking Benazepril-HCTZ for her HTN but was told to discontinue as she was becoming hypotensive while in the hospital. She continues taking Lipitor for her HLD and was previously on Ozempic for her Type 2 Diabetes. She has no significant family history of cancers or blood disorders. Pt has no known medication allergies. Pt had a hammer toe surgery and a tubal ligation. 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   In mid-June she began to notice a growth on the left side of her forehead. Because the lump continued to grow she visited her PCP at the end of June. Her PCP and Dermatologist thought that it was a lipoma initally. She was sent for a consult with Dr. Harlow Mares at [facility] in August. She received her first cranial MRI on 10/05/2019, which revealed a mass and bone erosion. Pt had her initial Craniectomy on 10/11/2019 and a Left Bifrontal Cranioplasty on 10/14/2019. Pt denies any new bone pain, fevers, chills, or night sweats at the time. Pt has lost 40 lbs over the last year, but this was after the addition of Ozempic and improved dietary and exercise habits. She has started regaining weight after holding Ozempic. At this time pt reports mild discomfort at surgical site and no other pertinent symptoms.  Of note prior to the patient's visit today, pt has had Surgical Pathology [id number]) completed on 10/10/2019 with results  revealing "A. BRAIN TUMOR, LEFT FRONTAL, RESECTION: - Plasma cell neoplasm B. BRAIN TUMOR, LEFT FRONTAL, RESECTION: - Plasma cell neoplasm C. BONE, LEFT FRONTAL, RESECTION: - Plasma cell neoplasm."   Pt has had MRI Brain ([id number]) completed on 10/11/2019 with results revealing "No complicating feature or residual tumor seen after left frontal bone mass resection."  Pt has had MRI Brain ([id number]) completed on 10/05/2019 with results revealing "1. Large extra-axial mass in the anterior left frontal region with bone erosion and extension into the scalp soft tissues. Considerations include hemangiopericytoma, metastasis, and aggressive meningioma. 2. 6 mm enhancing lesion in the clivus. This is indeterminate but does raise concern for metastatic disease. 3. Mild chronic small vessel ischemic disease."  Most recent lab results (10/11/2019) of CBC & BMP is as follows: all values are WNL except for WBC at 16.6K, RBC at 2.99, Hgb at 9.1, HCT at 26.5, Glucose at 104, Calcium at 8.4.  On review of systems, pt reports discomfort at surgicalsite and denies balance issues, memory loss, headaches, fevers, chills, unexpected weight loss and any othe symptoms.   On PMHx the pt reports HTN, HLD, Diabetes, Tubal Ligation, Hammer Toe Surgery, Herpes. On Social Hx the pt reports that she previously smoked on and off but is not currently smoking and does not drink much alcohol. On Family Hx the pt reports a grandfather with prostate cancer.  INTERVAL HISTORY:  Alexandra [name] is a wonderful 55 y.o. 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 The patient's last visit with [place]  was on 02/27/2020. The pt reports that she is doing well overall. She is here for C5D1 of KRD and next dose of Zometa.  The pt reports that she has not heard back from [facility] for a transplant yet. She had been previously referred to [facility], but she is OON. The pt notes that she has recently been  having back pains, unsure of what triggered this. She notes this is not positional, and is in her lower right back. The pain does not shoot down her legs. The pt notes she has been slightly concerned with her BP levels, noting they used to be around 115-120 and are now 138-140. She has been off her BP medicine since September. The pt notes that she frequently has to urinate during the night and tries to avoid drinking water closer to bed.   Lab results today 03/26/2020 of CBC w/diff and CMP is as follows: all values are WNL except for WBC of 3.7K, RBC of 3.53, Hgb of 11.0, HCT of 33.3, RDW of 15.6. CMP stable.  On review of systems, pt reports back pain in relation to movement, frequent urination at nighttime and denies discomfort / pain while urinating, frequent urination in daytime, abdominal pain, leg swelling and any other symptoms.  MEDICAL HISTORY:  Past Medical History:  Diagnosis Date  . Anxiety   . Hypertension   . Pre-diabetes     SURGICAL HISTORY: Past Surgical History:  Procedure Laterality Date  . ABLATION    . APPLICATION OF CRANIAL NAVIGATION N/A 10/10/2019   Procedure: APPLICATION OF CRANIAL NAVIGATION;  Surgeon: Consuella Lose, MD;  Location: [place];  Service: Neurosurgery;  Laterality: N/A;  APPLICATION OF CRANIAL NAVIGATION  . CRANIOPLASTY Left 10/14/2019   Procedure: LEFT CRANIOPLASTY WITH PLACEMENT OF CUSTOM CRANIAL IMPLANT;  Surgeon: Consuella Lose, MD;  Location: [place];  Service: Neurosurgery;  Laterality: Left;  . CRANIOTOMY Left 10/10/2019   Procedure: Left frontal Stereotactic craniectomy for resection of tumor;  Surgeon: Consuella Lose, MD;  Location: [place];  Service: Neurosurgery;  Laterality: Left;  Left frontal Stereotactic craniectomy for resection of tumor  . HAMMER TOE SURGERY Left   . IR IMAGING GUIDED PORT INSERTION  12/01/2019  . TUBAL LIGATION      SOCIAL HISTORY: Social History   Socioeconomic History  . Marital status: Single    Spouse  name: Not on file  . Number of children: Not on file  . Years of education: Not on file  . Highest education level: Not on file  Occupational History  . Not on file  Tobacco Use  . Smoking status: Never Smoker  . Smokeless tobacco: Never Used  Vaping Use  . Vaping Use: Never used  Substance and Sexual Activity  . Alcohol use: Yes    Comment: occassionally  . Drug use: Not Currently  . Sexual activity: Not on file  Other Topics Concern  . Not on file  Social History Narrative  . Not on file   Social Determinants of Health   Financial Resource Strain: Not on file  Food Insecurity: Not on file  Transportation Needs: Not on file  Physical Activity: Not on file  Stress: Not on file  Social Connections: Not on file  Intimate Partner Violence: Not on file    FAMILY HISTORY: Family History  Problem Relation Age of Onset  . Breast cancer Maternal Grandmother   . Heart disease Mother   . Hypertension Mother   . Sarcoidosis Father   . Diabetes Maternal  Grandfather     ALLERGIES:  has No Known Allergies.  MEDICATIONS:  Current Outpatient Medications  Medication Sig Dispense Refill  . acetaminophen (TYLENOL) 650 MG CR tablet Take 1,300 mg by mouth every 8 (eight) hours as needed for pain.    Marland Kitchen aspirin EC 81 MG tablet Take 1 tablet (81 mg total) by mouth daily. 100 tablet 3  . atorvastatin (LIPITOR) 10 MG tablet TAKE 1 TABLET BY MOUTH EVERY DAY 90 tablet 1  . Cholecalciferol (VITAMIN D) 50 MCG (2000 UT) tablet Take 2,000 Units by mouth daily.    . Coenzyme Q10 (COQ-10) 100 MG CAPS Take 200 mg by mouth daily.    Marland Kitchen dexamethasone (DECADRON) 4 MG tablet Take 10 tablets (29m) once on day 22. Repeat every 28 days for the first 4 cycles. Take with breakfast. 40 tablet 4  . Eszopiclone 3 MG TABS Take 1 tablet (3 mg total) by mouth at bedtime as needed (sleep). Take immediately before bedtime 30 tablet 0  . Insulin Pen Needle (NOVOFINE PLUS) 32G X 4 MM MISC Inject 1 pen into the skin  daily. Inject 1 pen with Victoza SQ 100 each 3  . lenalidomide (REVLIMID) 25 MG capsule TAKE 1 CAPSULE BY MOUTH EVERY DAY FOR 21 DAYS ON, THEN 7 DAYS OFF 21 capsule 0  . levocetirizine (XYZAL) 5 MG tablet TAKE 1 TABLET BY MOUTH EVERY DAY IN THE EVENING (Patient taking differently: Take 5 mg by mouth every evening.) 30 tablet 2  . lidocaine-prilocaine (EMLA) cream Apply to affected area once (Patient taking differently: Apply 1 application topically daily as needed (port access).) 30 g 3  . LORazepam (ATIVAN) 0.5 MG tablet TAKE 1 TABLET BY MOUTH EVERY 6 HOURS AS NEEDED (NAUSEA OR VOMITING). 30 tablet 0  . Multiple Vitamins-Minerals (IMMUNE SUPPORT PO) Take 2 tablets by mouth daily.    . ondansetron (ZOFRAN) 8 MG tablet Take 1 tablet (8 mg total) by mouth 2 (two) times daily as needed (Nausea or vomiting). 30 tablet 1  . Oxymetazoline HCl (AFRIN NODRIP SEVERE CONGEST NA) Place 1 spray into the nose daily as needed (congestion).    .Marland KitchenOZEMPIC, 0.25 OR 0.5 MG/DOSE, 2 MG/1.5ML SOPN INJECT 0.5 MG TOTAL INTO THE SKIN ONCE A WEEK. (Patient taking differently: Inject 0.5 mg as directed every Friday.) 4.5 mL 1  . prochlorperazine (COMPAZINE) 10 MG tablet Take 1 tablet (10 mg total) by mouth every 6 (six) hours as needed (Nausea or vomiting). 30 tablet 1  . senna (SENOKOT) 8.6 MG TABS tablet Take 17.2 mg by mouth daily as needed for mild constipation.    . traZODone (DESYREL) 50 MG tablet TAKE 1-2 TABLETS BY MOUTH AT BEDTIME AS NEEDED FOR SLEEP. 180 tablet 1  . Turmeric Curcumin 500 MG CAPS Take 500 mg by mouth daily.    . valACYclovir (VALTREX) 1000 MG tablet TAKE 1 TABLET BY MOUTH EVERY DAY AS NEEDED 90 tablet 1  . vitamin B-12 (CYANOCOBALAMIN) 1000 MCG tablet Take 1,000 mcg by mouth daily.     No current facility-administered medications for this visit.    REVIEW OF SYSTEMS:   10 Point review of Systems was done is negative except as noted above.  PHYSICAL EXAMINATION: ECOG PERFORMANCE STATUS: 1 -  Symptomatic but completely ambulatory  . Vitals:   03/26/20 0905  BP: 132/80  Pulse: 62  Resp: 17  Temp: 98.1 F (36.7 C)  SpO2: 100%   Filed Weights   03/26/20 0905  Weight: 170 lb (77.1 kg)   .  Body mass index is 26.63 kg/m.   GENERAL:alert, in no acute distress and comfortable SKIN: no acute rashes, no significant lesions EYES: conjunctiva are pink and non-injected, sclera anicteric OROPHARYNX: MMM, no exudates, no oropharyngeal erythema or ulceration NECK: supple, no JVD LYMPH:  no palpable lymphadenopathy in the cervical, axillary or inguinal regions LUNGS: clear to auscultation b/l with normal respiratory effort HEART: regular rate & rhythm ABDOMEN:  normoactive bowel sounds , non tender, not distended. Extremity: no pedal edema PSYCH: alert & oriented x 3 with fluent speech NEURO: no focal motor/sensory deficits  LABORATORY DATA:  I have reviewed the data as listed  CBC Latest Ref Rng & Units 03/26/2020 03/12/2020 03/05/2020  WBC 4.0 - 10.5 K/uL 3.7(L) 4.4 4.8  Hemoglobin 12.0 - 15.0 g/dL 11.0(L) 10.9(L) 10.2(L)  Hematocrit 36.0 - 46.0 % 33.3(L) 33.1(L) 30.0(L)  Platelets 150 - 400 K/uL 257 192 146(L)    CMP Latest Ref Rng & Units 03/26/2020 03/12/2020 03/05/2020  Glucose 70 - 99 mg/dL 91 88 80  BUN 6 - 20 mg/dL _0 Creatinine 0.44 - 1.00 mg/dL 0.76 0.79 0.77  Sodium 135 - 145 mmol/L 142 141 142  Potassium 3.5 - 5.1 mmol/L 4.1 3.7 3.9  Chloride 98 - 111 mmol/L 110 108 110  CO2 22 - 32 mmol/L _1 Calcium 8.9 - 10.3 mg/dL 8.6(L) 8.8(L) 8.5(L)  Total Protein 6.5 - 8.1 g/dL 6.0(L) 6.2(L) 5.5(L)  Total Bilirubin 0.3 - 1.2 mg/dL 1.0 0.9 1.0  Alkaline Phos 38 - 126 U/L 39 46 45  AST 15 - 41 U/L 14(L) 13(L) 15  ALT 0 - 44 U/L _2 11/08/2019 FISH Plasma Cell Myeloma Prognostic Panel:    11/08/2019 Cytogenetics:    11/08/2019 Bone Marrow Report [id number]):   10/10/2019 Surgical Pathology [id number]):   RADIOGRAPHIC STUDIES:  I have  personally reviewed the radiological images as listed and agreed with the findings in the report. No results found.  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 No M spike. Lambda light chain myeloma + Anemia + bone lesion on skull-- resected No renal failure, hypercalcemia  BMBx- 22-30% lambda restricted plasma cell MolCy Dup (1q), del (13q)  PLAN: -Discussed pt labwork today, 03/26/2020; blood counts stable, chemistries stable. -Advised pt that she is in remission due to her last light chains being normalized. Advised pt our goal is to get as deep a response as possible and increase time to progression. -Discussed future repeat PET/CT and BMBX scans prior to transplant or maintenance treatment. -Advised pt we would only complete one more cycle and then switch to maintenance treatment. Would get repeat Bm Bx and PET prior.  -Discussed potential transplant and provided details regarding this. -Advised pt that there is not a concern for needing to restart BP medicine. Will continue to monitor and advised pt to decrease daily salt intake.  -Advised pt the most common maintenance used is Revlimid. Would watch counts every two months. -Advised pt that she could call her insurance regarding coverage of [place] and the need for being physically close. -Advised pt that some transplant centers still perform it inpatient, but most are switching to outpatient. Would need 24/7 assistance for the first 3-4 weeks. -Will f/u w [facility] regarding appt with Dr Samule Ohm for consideration of HDT-AutoHSCT -Advised pt the better response  prior to transplant correlates to better response after transplant. -Recommend pt stay physically active, eat well, and drink 48-64 oz water daily. -The pt has no prohibitive toxicities from continuing [place] at this time. Continue  Carflizomib at 36 mg/m^2. -The pt has no prohibitive toxicities from continuing 25 mg Revlimid 3 weeks on/1 week off. -Continue daily baby ASA, Valtrex, & B complex. -Continue Zometa q4weeks  -Will see back in 4 weeks with C6D1.   FOLLOW UP: Plz schedule C6 of carfilzomib Revlimid dexamethasone as per orders. Labs on day 1 8 and 15 each cycle MD visit in 4 weeks with cycle 6-day 1 Patient has been referred to Dr. Lujean Rave at [facility] and is awaiting appointment   The total time spent in the appointment was 30 minutes and more than 50% was on counseling and direct patient cares.   All of the patient's questions were answered with apparent satisfaction. The patient knows to call the clinic with any problems, questions or concerns.    Sullivan Lone MD [place] AAHIVMS [facility] Hematology/Oncology Physician [facility]  (Office):       [phone] (Work cell):  [phone] (Fax):           [phone]  03/26/2020 9:39 AM  I, Reinaldo Raddle, am acting as scribe for Dr. Sullivan Lone, MD.    .I have reviewed the above documentation for accuracy and completeness, and I agree with the above. Brunetta Genera MD

## 2020-03-26 ENCOUNTER — Other Ambulatory Visit: Payer: 59

## 2020-03-26 ENCOUNTER — Inpatient Hospital Stay (HOSPITAL_BASED_OUTPATIENT_CLINIC_OR_DEPARTMENT_OTHER): Payer: 59 | Admitting: Hematology

## 2020-03-26 ENCOUNTER — Inpatient Hospital Stay: Payer: 59 | Attending: Hematology

## 2020-03-26 ENCOUNTER — Inpatient Hospital Stay: Payer: 59

## 2020-03-26 ENCOUNTER — Other Ambulatory Visit: Payer: Self-pay

## 2020-03-26 ENCOUNTER — Ambulatory Visit: Payer: 59

## 2020-03-26 VITALS — BP 132/80 | HR 62 | Temp 98.1°F | Resp 17 | Ht 67.0 in | Wt 170.0 lb

## 2020-03-26 DIAGNOSIS — C7951 Secondary malignant neoplasm of bone: Secondary | ICD-10-CM | POA: Diagnosis not present

## 2020-03-26 DIAGNOSIS — C9000 Multiple myeloma not having achieved remission: Secondary | ICD-10-CM

## 2020-03-26 DIAGNOSIS — C9001 Multiple myeloma in remission: Secondary | ICD-10-CM | POA: Diagnosis present

## 2020-03-26 DIAGNOSIS — Z5111 Encounter for antineoplastic chemotherapy: Secondary | ICD-10-CM | POA: Diagnosis not present

## 2020-03-26 DIAGNOSIS — Z5112 Encounter for antineoplastic immunotherapy: Secondary | ICD-10-CM | POA: Diagnosis not present

## 2020-03-26 DIAGNOSIS — Z7189 Other specified counseling: Secondary | ICD-10-CM

## 2020-03-26 DIAGNOSIS — Z95828 Presence of other vascular implants and grafts: Secondary | ICD-10-CM

## 2020-03-26 LAB — CBC WITH DIFFERENTIAL/PLATELET
Abs Immature Granulocytes: 0 10*3/uL (ref 0.00–0.07)
Basophils Absolute: 0.1 10*3/uL (ref 0.0–0.1)
Basophils Relative: 1 %
Eosinophils Absolute: 0.1 10*3/uL (ref 0.0–0.5)
Eosinophils Relative: 2 %
HCT: 33.3 % — ABNORMAL LOW (ref 36.0–46.0)
Hemoglobin: 11 g/dL — ABNORMAL LOW (ref 12.0–15.0)
Immature Granulocytes: 0 %
Lymphocytes Relative: 32 %
Lymphs Abs: 1.2 10*3/uL (ref 0.7–4.0)
MCH: 31.2 pg (ref 26.0–34.0)
MCHC: 33 g/dL (ref 30.0–36.0)
MCV: 94.3 fL (ref 80.0–100.0)
Monocytes Absolute: 0.5 10*3/uL (ref 0.1–1.0)
Monocytes Relative: 14 %
Neutro Abs: 1.8 10*3/uL (ref 1.7–7.7)
Neutrophils Relative %: 51 %
Platelets: 257 10*3/uL (ref 150–400)
RBC: 3.53 MIL/uL — ABNORMAL LOW (ref 3.87–5.11)
RDW: 15.6 % — ABNORMAL HIGH (ref 11.5–15.5)
WBC: 3.7 10*3/uL — ABNORMAL LOW (ref 4.0–10.5)
nRBC: 0 % (ref 0.0–0.2)

## 2020-03-26 LAB — CMP (CANCER CENTER ONLY)
ALT: 15 U/L (ref 0–44)
AST: 14 U/L — ABNORMAL LOW (ref 15–41)
Albumin: 3.8 g/dL (ref 3.5–5.0)
Alkaline Phosphatase: 39 U/L (ref 38–126)
Anion gap: 6 (ref 5–15)
BUN: 14 mg/dL (ref 6–20)
CO2: 26 mmol/L (ref 22–32)
Calcium: 8.6 mg/dL — ABNORMAL LOW (ref 8.9–10.3)
Chloride: 110 mmol/L (ref 98–111)
Creatinine: 0.76 mg/dL (ref 0.44–1.00)
GFR, Estimated: >60 mL/min (ref >60)
Glucose, Bld: 91 mg/dL (ref 70–99)
Potassium: 4.1 mmol/L (ref 3.5–5.1)
Sodium: 142 mmol/L (ref 135–145)
Total Bilirubin: 1 mg/dL (ref 0.3–1.2)
Total Protein: 6 g/dL — ABNORMAL LOW (ref 6.5–8.1)

## 2020-03-26 MED ORDER — SODIUM CHLORIDE 0.9 % IV SOLN
Freq: Once | INTRAVENOUS | Status: AC
Start: 2020-03-26 — End: 2020-03-26
  Filled 2020-03-26: qty 250

## 2020-03-26 MED ORDER — SODIUM CHLORIDE 0.9 % IV SOLN
Freq: Once | INTRAVENOUS | Status: AC
  Filled 2020-03-26: qty 250

## 2020-03-26 MED ORDER — DEXTROSE 5 % IV SOLN
36.0000 mg/m2 | Freq: Once | INTRAVENOUS | Status: AC
  Administered 2020-03-26: 70 mg via INTRAVENOUS
  Filled 2020-03-26: qty 5

## 2020-03-26 MED ORDER — SODIUM CHLORIDE 0.9 % IV SOLN
20.0000 mg | Freq: Once | INTRAVENOUS | Status: AC
  Administered 2020-03-26: 20 mg via INTRAVENOUS
  Filled 2020-03-26: qty 20

## 2020-03-26 MED ORDER — SODIUM CHLORIDE 0.9% FLUSH
10.0000 mL | INTRAVENOUS | Status: DC | PRN
  Administered 2020-03-26 (×2): 10 mL
  Filled 2020-03-26: qty 10

## 2020-03-26 MED ORDER — ZOLEDRONIC ACID 4 MG/100ML IV SOLN
INTRAVENOUS | Status: AC
  Filled 2020-03-26: qty 100

## 2020-03-26 MED ORDER — HEPARIN SOD (PORK) LOCK FLUSH 100 UNIT/ML IV SOLN
500.0000 [IU] | Freq: Once | INTRAVENOUS | Status: AC | PRN
  Administered 2020-03-26: 500 [IU]
  Filled 2020-03-26: qty 5

## 2020-03-26 MED ORDER — ZOLEDRONIC ACID 4 MG/100ML IV SOLN
4.0000 mg | Freq: Once | INTRAVENOUS | Status: AC
  Administered 2020-03-26: 4 mg via INTRAVENOUS

## 2020-03-26 NOTE — Progress Notes (Signed)
Per Dr. Irene Limbo: okay to give Zometa with Calcium of 8.6.

## 2020-03-26 NOTE — Patient Instructions (Signed)
[place] Discharge Instructions for Patients Receiving Chemotherapy  Today you received the following chemotherapy agents:Kyprolis and Zometa  To help prevent nausea and vomiting after your treatment, we encourage you to take your nausea medication as directed.    If you develop nausea and vomiting that is not controlled by your nausea medication, call the clinic.   BELOW ARE SYMPTOMS THAT SHOULD BE REPORTED IMMEDIATELY:  *FEVER GREATER THAN 100.5 F  *CHILLS WITH OR WITHOUT FEVER  NAUSEA AND VOMITING THAT IS NOT CONTROLLED WITH YOUR NAUSEA MEDICATION  *UNUSUAL SHORTNESS OF BREATH  *UNUSUAL BRUISING OR BLEEDING  TENDERNESS IN MOUTH AND THROAT WITH OR WITHOUT PRESENCE OF ULCERS  *URINARY PROBLEMS  *BOWEL PROBLEMS  UNUSUAL RASH Items with * indicate a potential emergency and should be followed up as soon as possible.  Feel free to call the clinic should you have any questions or concerns. The clinic phone number is [phone].  Please show the [place] at check-in to the Emergency Department and triage nurse.

## 2020-03-26 NOTE — Progress Notes (Signed)
Okay to tx per Dr.Kale r/t calcium of 8.6

## 2020-03-27 ENCOUNTER — Other Ambulatory Visit: Payer: Self-pay

## 2020-03-27 ENCOUNTER — Inpatient Hospital Stay: Payer: 59

## 2020-03-27 VITALS — BP 138/77 | HR 66 | Temp 98.3°F | Resp 18

## 2020-03-27 DIAGNOSIS — Z7189 Other specified counseling: Secondary | ICD-10-CM

## 2020-03-27 DIAGNOSIS — C9000 Multiple myeloma not having achieved remission: Secondary | ICD-10-CM

## 2020-03-27 DIAGNOSIS — Z5112 Encounter for antineoplastic immunotherapy: Secondary | ICD-10-CM | POA: Diagnosis not present

## 2020-03-27 DIAGNOSIS — C7951 Secondary malignant neoplasm of bone: Secondary | ICD-10-CM

## 2020-03-27 MED ORDER — DEXTROSE 5 % IV SOLN
36.0000 mg/m2 | Freq: Once | INTRAVENOUS | Status: AC
  Administered 2020-03-27: 70 mg via INTRAVENOUS
  Filled 2020-03-27: qty 30

## 2020-03-27 MED ORDER — HEPARIN SOD (PORK) LOCK FLUSH 100 UNIT/ML IV SOLN
500.0000 [IU] | Freq: Once | INTRAVENOUS | Status: AC | PRN
  Administered 2020-03-27: 500 [IU]
  Filled 2020-03-27: qty 5

## 2020-03-27 MED ORDER — PROCHLORPERAZINE MALEATE 10 MG PO TABS
ORAL_TABLET | ORAL | Status: AC
  Filled 2020-03-27: qty 1

## 2020-03-27 MED ORDER — PROCHLORPERAZINE MALEATE 10 MG PO TABS
10.0000 mg | ORAL_TABLET | Freq: Once | ORAL | Status: AC
  Administered 2020-03-27: 10 mg via ORAL

## 2020-03-27 MED ORDER — SODIUM CHLORIDE 0.9 % IV SOLN
Freq: Once | INTRAVENOUS | Status: AC
  Filled 2020-03-27: qty 250

## 2020-03-27 MED ORDER — SODIUM CHLORIDE 0.9% FLUSH
10.0000 mL | INTRAVENOUS | Status: DC | PRN
  Administered 2020-03-27: 10 mL
  Filled 2020-03-27: qty 10

## 2020-03-27 NOTE — Patient Instructions (Signed)
[facility] Discharge Instructions for Patients Receiving Chemotherapy  Today you received the following chemotherapy agents: carfilzomib.  To help prevent nausea and vomiting after your treatment, we encourage you to take your nausea medication as directed.   If you develop nausea and vomiting that is not controlled by your nausea medication, call the clinic.   BELOW ARE SYMPTOMS THAT SHOULD BE REPORTED IMMEDIATELY:  *FEVER GREATER THAN 100.5 F  *CHILLS WITH OR WITHOUT FEVER  NAUSEA AND VOMITING THAT IS NOT CONTROLLED WITH YOUR NAUSEA MEDICATION  *UNUSUAL SHORTNESS OF BREATH  *UNUSUAL BRUISING OR BLEEDING  TENDERNESS IN MOUTH AND THROAT WITH OR WITHOUT PRESENCE OF ULCERS  *URINARY PROBLEMS  *BOWEL PROBLEMS  UNUSUAL RASH Items with * indicate a potential emergency and should be followed up as soon as possible.  Feel free to call the clinic should you have any questions or concerns. The clinic phone number is [phone].  Please show the CHEMO ALERT CARD at check-in to the Emergency Department and triage nurse.   

## 2020-03-28 ENCOUNTER — Telehealth: Payer: Self-pay | Admitting: *Deleted

## 2020-03-28 NOTE — Telephone Encounter (Signed)
Patient has appt with Dr. Wilber Oliphant on April 20 at [place] for consideration for transplant. Dr. Irene Limbo informed. He states he will continue her treatment at this time and discuss with her at appt here at [facility] on 4/4. Contacted patient with this information. Patient in agreement with MD plan.

## 2020-03-30 ENCOUNTER — Other Ambulatory Visit: Payer: Self-pay | Admitting: Hematology

## 2020-03-30 DIAGNOSIS — C9000 Multiple myeloma not having achieved remission: Secondary | ICD-10-CM

## 2020-03-30 DIAGNOSIS — Z7189 Other specified counseling: Secondary | ICD-10-CM

## 2020-03-30 NOTE — Telephone Encounter (Signed)
Can refill on Tuesday 04/03/20. Requires [employer] Auth# and that is earliest date to obtain the auth# .

## 2020-04-02 ENCOUNTER — Inpatient Hospital Stay: Payer: 59

## 2020-04-02 ENCOUNTER — Inpatient Hospital Stay (HOSPITAL_BASED_OUTPATIENT_CLINIC_OR_DEPARTMENT_OTHER): Payer: 59

## 2020-04-02 ENCOUNTER — Other Ambulatory Visit: Payer: Self-pay | Admitting: Nurse Practitioner

## 2020-04-02 ENCOUNTER — Other Ambulatory Visit: Payer: Self-pay

## 2020-04-02 VITALS — BP 143/85 | HR 60 | Temp 98.6°F | Resp 18 | Ht 67.0 in | Wt 170.0 lb

## 2020-04-02 DIAGNOSIS — C9000 Multiple myeloma not having achieved remission: Secondary | ICD-10-CM

## 2020-04-02 DIAGNOSIS — Z5112 Encounter for antineoplastic immunotherapy: Secondary | ICD-10-CM | POA: Diagnosis not present

## 2020-04-02 DIAGNOSIS — Z7189 Other specified counseling: Secondary | ICD-10-CM

## 2020-04-02 DIAGNOSIS — C7951 Secondary malignant neoplasm of bone: Secondary | ICD-10-CM

## 2020-04-02 DIAGNOSIS — Z95828 Presence of other vascular implants and grafts: Secondary | ICD-10-CM

## 2020-04-02 DIAGNOSIS — J302 Other seasonal allergic rhinitis: Secondary | ICD-10-CM

## 2020-04-02 LAB — CMP (CANCER CENTER ONLY)
ALT: 12 U/L (ref 0–44)
AST: 13 U/L — ABNORMAL LOW (ref 15–41)
Albumin: 3.7 g/dL (ref 3.5–5.0)
Alkaline Phosphatase: 41 U/L (ref 38–126)
Anion gap: 8 (ref 5–15)
BUN: 11 mg/dL (ref 6–20)
CO2: 26 mmol/L (ref 22–32)
Calcium: 8.8 mg/dL — ABNORMAL LOW (ref 8.9–10.3)
Chloride: 107 mmol/L (ref 98–111)
Creatinine: 0.76 mg/dL (ref 0.44–1.00)
GFR, Estimated: >60 mL/min (ref >60)
Glucose, Bld: 88 mg/dL (ref 70–99)
Potassium: 4 mmol/L (ref 3.5–5.1)
Sodium: 141 mmol/L (ref 135–145)
Total Bilirubin: 0.8 mg/dL (ref 0.3–1.2)
Total Protein: 6 g/dL — ABNORMAL LOW (ref 6.5–8.1)

## 2020-04-02 LAB — CBC WITH DIFFERENTIAL/PLATELET
Abs Immature Granulocytes: 0.02 10*3/uL (ref 0.00–0.07)
Basophils Absolute: 0 10*3/uL (ref 0.0–0.1)
Basophils Relative: 1 %
Eosinophils Absolute: 0.2 10*3/uL (ref 0.0–0.5)
Eosinophils Relative: 5 %
HCT: 33.6 % — ABNORMAL LOW (ref 36.0–46.0)
Hemoglobin: 11.2 g/dL — ABNORMAL LOW (ref 12.0–15.0)
Immature Granulocytes: 0 %
Lymphocytes Relative: 23 %
Lymphs Abs: 1 10*3/uL (ref 0.7–4.0)
MCH: 31.1 pg (ref 26.0–34.0)
MCHC: 33.3 g/dL (ref 30.0–36.0)
MCV: 93.3 fL (ref 80.0–100.0)
Monocytes Absolute: 0.5 10*3/uL (ref 0.1–1.0)
Monocytes Relative: 11 %
Neutro Abs: 2.7 10*3/uL (ref 1.7–7.7)
Neutrophils Relative %: 60 %
Platelets: 175 10*3/uL (ref 150–400)
RBC: 3.6 MIL/uL — ABNORMAL LOW (ref 3.87–5.11)
RDW: 15.2 % (ref 11.5–15.5)
WBC: 4.5 10*3/uL (ref 4.0–10.5)
nRBC: 0 % (ref 0.0–0.2)

## 2020-04-02 MED ORDER — HEPARIN SOD (PORK) LOCK FLUSH 100 UNIT/ML IV SOLN
500.0000 [IU] | Freq: Once | INTRAVENOUS | Status: AC | PRN
  Administered 2020-04-02: 500 [IU]
  Filled 2020-04-02: qty 5

## 2020-04-02 MED ORDER — DEXTROSE 5 % IV SOLN
36.0000 mg/m2 | Freq: Once | INTRAVENOUS | Status: AC
  Administered 2020-04-02: 70 mg via INTRAVENOUS
  Filled 2020-04-02: qty 30

## 2020-04-02 MED ORDER — SODIUM CHLORIDE 0.9 % IV SOLN
Freq: Once | INTRAVENOUS | Status: AC
Start: 2020-04-02 — End: 2020-04-02
  Filled 2020-04-02: qty 250

## 2020-04-02 MED ORDER — SODIUM CHLORIDE 0.9 % IV SOLN
20.0000 mg | Freq: Once | INTRAVENOUS | Status: AC
  Administered 2020-04-02: 20 mg via INTRAVENOUS
  Filled 2020-04-02: qty 20

## 2020-04-02 MED ORDER — SODIUM CHLORIDE 0.9% FLUSH
10.0000 mL | Freq: Once | INTRAVENOUS | Status: AC
Start: 2020-04-02 — End: 2020-04-02
  Administered 2020-04-02: 10 mL
  Filled 2020-04-02: qty 10

## 2020-04-02 MED ORDER — SODIUM CHLORIDE 0.9 % IV SOLN
Freq: Once | INTRAVENOUS | Status: AC
  Filled 2020-04-02: qty 250

## 2020-04-02 MED ORDER — SODIUM CHLORIDE 0.9% FLUSH
10.0000 mL | INTRAVENOUS | Status: DC | PRN
  Administered 2020-04-02: 10 mL
  Filled 2020-04-02: qty 10

## 2020-04-02 NOTE — Progress Notes (Signed)
Patient stayed accessed for treatment on 3/15

## 2020-04-02 NOTE — Patient Instructions (Signed)
[facility] Discharge Instructions for Patients Receiving Chemotherapy  Today you received the following chemotherapy agents Carfilzomib (KYPROLIS).  To help prevent nausea and vomiting after your treatment, we encourage you to take your nausea medication as prescribed.  If you develop nausea and vomiting that is not controlled by your nausea medication, call the clinic.   BELOW ARE SYMPTOMS THAT SHOULD BE REPORTED IMMEDIATELY:  *FEVER GREATER THAN 100.5 F  *CHILLS WITH OR WITHOUT FEVER  NAUSEA AND VOMITING THAT IS NOT CONTROLLED WITH YOUR NAUSEA MEDICATION  *UNUSUAL SHORTNESS OF BREATH  *UNUSUAL BRUISING OR BLEEDING  TENDERNESS IN MOUTH AND THROAT WITH OR WITHOUT PRESENCE OF ULCERS  *URINARY PROBLEMS  *BOWEL PROBLEMS  UNUSUAL RASH Items with * indicate a potential emergency and should be followed up as soon as possible.  Feel free to call the clinic should you have any questions or concerns. The clinic phone number is [phone].  Please show the CHEMO ALERT CARD at check-in to the Emergency Department and triage nurse.   

## 2020-04-02 NOTE — Patient Instructions (Signed)

## 2020-04-03 ENCOUNTER — Other Ambulatory Visit: Payer: Self-pay

## 2020-04-03 ENCOUNTER — Inpatient Hospital Stay: Payer: 59

## 2020-04-03 VITALS — BP 145/66 | HR 63 | Temp 98.4°F | Resp 18

## 2020-04-03 DIAGNOSIS — Z7189 Other specified counseling: Secondary | ICD-10-CM

## 2020-04-03 DIAGNOSIS — C9000 Multiple myeloma not having achieved remission: Secondary | ICD-10-CM

## 2020-04-03 DIAGNOSIS — C7951 Secondary malignant neoplasm of bone: Secondary | ICD-10-CM

## 2020-04-03 DIAGNOSIS — Z5112 Encounter for antineoplastic immunotherapy: Secondary | ICD-10-CM | POA: Diagnosis not present

## 2020-04-03 LAB — KAPPA/LAMBDA LIGHT CHAINS
Kappa free light chain: 10.5 mg/L (ref 3.3–19.4)
Kappa, lambda light chain ratio: 0.67 (ref 0.26–1.65)
Lambda free light chains: 15.6 mg/L (ref 5.7–26.3)

## 2020-04-03 MED ORDER — SODIUM CHLORIDE 0.9 % IV SOLN
Freq: Once | INTRAVENOUS | Status: AC
  Filled 2020-04-03: qty 250

## 2020-04-03 MED ORDER — DEXTROSE 5 % IV SOLN
36.0000 mg/m2 | Freq: Once | INTRAVENOUS | Status: AC
  Administered 2020-04-03: 70 mg via INTRAVENOUS
  Filled 2020-04-03: qty 30

## 2020-04-03 MED ORDER — PROCHLORPERAZINE MALEATE 10 MG PO TABS
10.0000 mg | ORAL_TABLET | Freq: Once | ORAL | Status: AC
  Administered 2020-04-03: 10 mg via ORAL

## 2020-04-03 MED ORDER — HEPARIN SOD (PORK) LOCK FLUSH 100 UNIT/ML IV SOLN
500.0000 [IU] | Freq: Once | INTRAVENOUS | Status: AC | PRN
  Administered 2020-04-03: 500 [IU]
  Filled 2020-04-03: qty 5

## 2020-04-03 MED ORDER — PROCHLORPERAZINE MALEATE 10 MG PO TABS
ORAL_TABLET | ORAL | Status: AC
  Filled 2020-04-03: qty 1

## 2020-04-03 MED ORDER — SODIUM CHLORIDE 0.9% FLUSH
10.0000 mL | INTRAVENOUS | Status: DC | PRN
  Administered 2020-04-03: 10 mL
  Filled 2020-04-03: qty 10

## 2020-04-03 NOTE — Patient Instructions (Signed)
[facility] Discharge Instructions for Patients Receiving Chemotherapy  Today you received the following chemotherapy agents Carfilzomib (KYPROLIS).  To help prevent nausea and vomiting after your treatment, we encourage you to take your nausea medication as prescribed.  If you develop nausea and vomiting that is not controlled by your nausea medication, call the clinic.   BELOW ARE SYMPTOMS THAT SHOULD BE REPORTED IMMEDIATELY:  *FEVER GREATER THAN 100.5 F  *CHILLS WITH OR WITHOUT FEVER  NAUSEA AND VOMITING THAT IS NOT CONTROLLED WITH YOUR NAUSEA MEDICATION  *UNUSUAL SHORTNESS OF BREATH  *UNUSUAL BRUISING OR BLEEDING  TENDERNESS IN MOUTH AND THROAT WITH OR WITHOUT PRESENCE OF ULCERS  *URINARY PROBLEMS  *BOWEL PROBLEMS  UNUSUAL RASH Items with * indicate a potential emergency and should be followed up as soon as possible.  Feel free to call the clinic should you have any questions or concerns. The clinic phone number is [phone].  Please show the CHEMO ALERT CARD at check-in to the Emergency Department and triage nurse.   

## 2020-04-05 LAB — MULTIPLE MYELOMA PANEL, SERUM
Albumin SerPl Elph-Mcnc: 3.3 g/dL (ref 2.9–4.4)
Albumin/Glob SerPl: 1.6 (ref 0.7–1.7)
Alpha 1: 0.2 g/dL (ref 0.0–0.4)
Alpha2 Glob SerPl Elph-Mcnc: 0.6 g/dL (ref 0.4–1.0)
B-Globulin SerPl Elph-Mcnc: 0.9 g/dL (ref 0.7–1.3)
Gamma Glob SerPl Elph-Mcnc: 0.5 g/dL (ref 0.4–1.8)
Globulin, Total: 2.2 g/dL (ref 2.2–3.9)
IgA: 41 mg/dL — ABNORMAL LOW (ref 87–352)
IgG (Immunoglobin G), Serum: 467 mg/dL — ABNORMAL LOW (ref 586–1602)
IgM (Immunoglobulin M), Srm: 21 mg/dL — ABNORMAL LOW (ref 26–217)
Total Protein ELP: 5.5 g/dL — ABNORMAL LOW (ref 6.0–8.5)

## 2020-04-09 ENCOUNTER — Inpatient Hospital Stay: Payer: 59

## 2020-04-09 ENCOUNTER — Other Ambulatory Visit: Payer: Self-pay

## 2020-04-09 DIAGNOSIS — C7951 Secondary malignant neoplasm of bone: Secondary | ICD-10-CM

## 2020-04-09 DIAGNOSIS — C9000 Multiple myeloma not having achieved remission: Secondary | ICD-10-CM

## 2020-04-09 DIAGNOSIS — Z7189 Other specified counseling: Secondary | ICD-10-CM

## 2020-04-09 DIAGNOSIS — Z5112 Encounter for antineoplastic immunotherapy: Secondary | ICD-10-CM | POA: Diagnosis not present

## 2020-04-09 LAB — CBC WITH DIFFERENTIAL/PLATELET
Abs Immature Granulocytes: 0.01 10*3/uL (ref 0.00–0.07)
Basophils Absolute: 0 10*3/uL (ref 0.0–0.1)
Basophils Relative: 1 %
Eosinophils Absolute: 0.4 10*3/uL (ref 0.0–0.5)
Eosinophils Relative: 9 %
HCT: 34.3 % — ABNORMAL LOW (ref 36.0–46.0)
Hemoglobin: 11.5 g/dL — ABNORMAL LOW (ref 12.0–15.0)
Immature Granulocytes: 0 %
Lymphocytes Relative: 23 %
Lymphs Abs: 0.9 10*3/uL (ref 0.7–4.0)
MCH: 31.2 pg (ref 26.0–34.0)
MCHC: 33.5 g/dL (ref 30.0–36.0)
MCV: 93 fL (ref 80.0–100.0)
Monocytes Absolute: 0.7 10*3/uL (ref 0.1–1.0)
Monocytes Relative: 18 %
Neutro Abs: 2 10*3/uL (ref 1.7–7.7)
Neutrophils Relative %: 49 %
Platelets: 192 10*3/uL (ref 150–400)
RBC: 3.69 MIL/uL — ABNORMAL LOW (ref 3.87–5.11)
RDW: 14.7 % (ref 11.5–15.5)
WBC: 4 10*3/uL (ref 4.0–10.5)
nRBC: 0 % (ref 0.0–0.2)

## 2020-04-09 LAB — CMP (CANCER CENTER ONLY)
ALT: 21 U/L (ref 0–44)
AST: 18 U/L (ref 15–41)
Albumin: 3.6 g/dL (ref 3.5–5.0)
Alkaline Phosphatase: 47 U/L (ref 38–126)
Anion gap: 6 (ref 5–15)
BUN: 9 mg/dL (ref 6–20)
CO2: 27 mmol/L (ref 22–32)
Calcium: 8.8 mg/dL — ABNORMAL LOW (ref 8.9–10.3)
Chloride: 107 mmol/L (ref 98–111)
Creatinine: 0.76 mg/dL (ref 0.44–1.00)
GFR, Estimated: >60 mL/min (ref >60)
Glucose, Bld: 80 mg/dL (ref 70–99)
Potassium: 4 mmol/L (ref 3.5–5.1)
Sodium: 140 mmol/L (ref 135–145)
Total Bilirubin: 0.9 mg/dL (ref 0.3–1.2)
Total Protein: 6 g/dL — ABNORMAL LOW (ref 6.5–8.1)

## 2020-04-09 MED ORDER — SODIUM CHLORIDE 0.9 % IV SOLN
20.0000 mg | Freq: Once | INTRAVENOUS | Status: AC
  Administered 2020-04-09: 20 mg via INTRAVENOUS
  Filled 2020-04-09: qty 20

## 2020-04-09 MED ORDER — SODIUM CHLORIDE 0.9 % IV SOLN
Freq: Once | INTRAVENOUS | Status: AC
  Filled 2020-04-09: qty 250

## 2020-04-09 MED ORDER — SODIUM CHLORIDE 0.9 % IV SOLN
Freq: Once | INTRAVENOUS | Status: DC
  Filled 2020-04-09: qty 250

## 2020-04-09 MED ORDER — CARFILZOMIB CHEMO INJECTION 60 MG
36.0000 mg/m2 | Freq: Once | INTRAVENOUS | Status: AC
  Administered 2020-04-09: 70 mg via INTRAVENOUS
  Filled 2020-04-09: qty 5

## 2020-04-09 NOTE — Patient Instructions (Signed)
[facility] Discharge Instructions for Patients Receiving Chemotherapy  Today you received the following chemotherapy agents Carfilzomib (KYPROLIS).  To help prevent nausea and vomiting after your treatment, we encourage you to take your nausea medication as prescribed.  If you develop nausea and vomiting that is not controlled by your nausea medication, call the clinic.   BELOW ARE SYMPTOMS THAT SHOULD BE REPORTED IMMEDIATELY:  *FEVER GREATER THAN 100.5 F  *CHILLS WITH OR WITHOUT FEVER  NAUSEA AND VOMITING THAT IS NOT CONTROLLED WITH YOUR NAUSEA MEDICATION  *UNUSUAL SHORTNESS OF BREATH  *UNUSUAL BRUISING OR BLEEDING  TENDERNESS IN MOUTH AND THROAT WITH OR WITHOUT PRESENCE OF ULCERS  *URINARY PROBLEMS  *BOWEL PROBLEMS  UNUSUAL RASH Items with * indicate a potential emergency and should be followed up as soon as possible.  Feel free to call the clinic should you have any questions or concerns. The clinic phone number is [phone].  Please show the CHEMO ALERT CARD at check-in to the Emergency Department and triage nurse.   

## 2020-04-09 NOTE — Patient Instructions (Signed)

## 2020-04-10 ENCOUNTER — Inpatient Hospital Stay: Payer: 59

## 2020-04-10 VITALS — BP 149/83 | HR 65 | Temp 98.4°F | Resp 18

## 2020-04-10 DIAGNOSIS — Z7189 Other specified counseling: Secondary | ICD-10-CM

## 2020-04-10 DIAGNOSIS — C7951 Secondary malignant neoplasm of bone: Secondary | ICD-10-CM

## 2020-04-10 DIAGNOSIS — C9000 Multiple myeloma not having achieved remission: Secondary | ICD-10-CM

## 2020-04-10 DIAGNOSIS — Z5112 Encounter for antineoplastic immunotherapy: Secondary | ICD-10-CM | POA: Diagnosis not present

## 2020-04-10 MED ORDER — PROCHLORPERAZINE MALEATE 10 MG PO TABS
ORAL_TABLET | ORAL | Status: AC
  Filled 2020-04-10: qty 1

## 2020-04-10 MED ORDER — SODIUM CHLORIDE 0.9 % IV SOLN
Freq: Once | INTRAVENOUS | Status: AC
  Filled 2020-04-10: qty 250

## 2020-04-10 MED ORDER — SODIUM CHLORIDE 0.9% FLUSH
10.0000 mL | INTRAVENOUS | Status: DC | PRN
  Administered 2020-04-10: 10 mL
  Filled 2020-04-10: qty 10

## 2020-04-10 MED ORDER — HEPARIN SOD (PORK) LOCK FLUSH 100 UNIT/ML IV SOLN
500.0000 [IU] | Freq: Once | INTRAVENOUS | Status: AC | PRN
  Administered 2020-04-10: 500 [IU]
  Filled 2020-04-10: qty 5

## 2020-04-10 MED ORDER — DEXTROSE 5 % IV SOLN
36.0000 mg/m2 | Freq: Once | INTRAVENOUS | Status: AC
  Administered 2020-04-10: 70 mg via INTRAVENOUS
  Filled 2020-04-10: qty 30

## 2020-04-10 MED ORDER — PROCHLORPERAZINE MALEATE 10 MG PO TABS
10.0000 mg | ORAL_TABLET | Freq: Once | ORAL | Status: AC
  Administered 2020-04-10: 10 mg via ORAL

## 2020-04-10 NOTE — Patient Instructions (Signed)
[facility] Discharge Instructions for Patients Receiving Chemotherapy  Today you received the following chemotherapy agents Carfilzomib (KYPROLIS).  To help prevent nausea and vomiting after your treatment, we encourage you to take your nausea medication as prescribed.  If you develop nausea and vomiting that is not controlled by your nausea medication, call the clinic.   BELOW ARE SYMPTOMS THAT SHOULD BE REPORTED IMMEDIATELY:  *FEVER GREATER THAN 100.5 F  *CHILLS WITH OR WITHOUT FEVER  NAUSEA AND VOMITING THAT IS NOT CONTROLLED WITH YOUR NAUSEA MEDICATION  *UNUSUAL SHORTNESS OF BREATH  *UNUSUAL BRUISING OR BLEEDING  TENDERNESS IN MOUTH AND THROAT WITH OR WITHOUT PRESENCE OF ULCERS  *URINARY PROBLEMS  *BOWEL PROBLEMS  UNUSUAL RASH Items with * indicate a potential emergency and should be followed up as soon as possible.  Feel free to call the clinic should you have any questions or concerns. The clinic phone number is [phone].  Please show the CHEMO ALERT CARD at check-in to the Emergency Department and triage nurse.   

## 2020-04-19 ENCOUNTER — Other Ambulatory Visit (HOSPITAL_COMMUNITY): Payer: Self-pay

## 2020-04-21 ENCOUNTER — Other Ambulatory Visit: Payer: Self-pay | Admitting: Hematology

## 2020-04-21 DIAGNOSIS — Z7189 Other specified counseling: Secondary | ICD-10-CM

## 2020-04-21 DIAGNOSIS — C9000 Multiple myeloma not having achieved remission: Secondary | ICD-10-CM

## 2020-04-23 ENCOUNTER — Inpatient Hospital Stay (HOSPITAL_BASED_OUTPATIENT_CLINIC_OR_DEPARTMENT_OTHER): Payer: 59 | Admitting: Hematology

## 2020-04-23 ENCOUNTER — Other Ambulatory Visit: Payer: Self-pay

## 2020-04-23 ENCOUNTER — Inpatient Hospital Stay: Payer: 59

## 2020-04-23 ENCOUNTER — Inpatient Hospital Stay: Payer: 59 | Attending: Hematology

## 2020-04-23 VITALS — BP 112/71 | HR 64 | Temp 97.8°F | Resp 14 | Ht 67.0 in | Wt 171.4 lb

## 2020-04-23 DIAGNOSIS — R350 Frequency of micturition: Secondary | ICD-10-CM

## 2020-04-23 DIAGNOSIS — Z7189 Other specified counseling: Secondary | ICD-10-CM

## 2020-04-23 DIAGNOSIS — C9000 Multiple myeloma not having achieved remission: Secondary | ICD-10-CM

## 2020-04-23 DIAGNOSIS — Z5112 Encounter for antineoplastic immunotherapy: Secondary | ICD-10-CM | POA: Insufficient documentation

## 2020-04-23 DIAGNOSIS — Z5111 Encounter for antineoplastic chemotherapy: Secondary | ICD-10-CM | POA: Diagnosis not present

## 2020-04-23 DIAGNOSIS — C7951 Secondary malignant neoplasm of bone: Secondary | ICD-10-CM

## 2020-04-23 DIAGNOSIS — C9001 Multiple myeloma in remission: Secondary | ICD-10-CM | POA: Diagnosis present

## 2020-04-23 DIAGNOSIS — Z95828 Presence of other vascular implants and grafts: Secondary | ICD-10-CM

## 2020-04-23 LAB — URINALYSIS, COMPLETE (UACMP) WITH MICROSCOPIC
Bilirubin Urine: NEGATIVE
Glucose, UA: NEGATIVE mg/dL
Hgb urine dipstick: NEGATIVE
Ketones, ur: NEGATIVE mg/dL
Nitrite: NEGATIVE
Protein, ur: NEGATIVE mg/dL
Specific Gravity, Urine: 1.011 (ref 1.005–1.030)
pH: 6 (ref 5.0–8.0)

## 2020-04-23 LAB — CMP (CANCER CENTER ONLY)
ALT: 16 U/L (ref 0–44)
AST: 17 U/L (ref 15–41)
Albumin: 3.9 g/dL (ref 3.5–5.0)
Alkaline Phosphatase: 49 U/L (ref 38–126)
Anion gap: 9 (ref 5–15)
BUN: 13 mg/dL (ref 6–20)
CO2: 26 mmol/L (ref 22–32)
Calcium: 8.4 mg/dL — ABNORMAL LOW (ref 8.9–10.3)
Chloride: 107 mmol/L (ref 98–111)
Creatinine: 0.78 mg/dL (ref 0.44–1.00)
GFR, Estimated: >60 mL/min (ref >60)
Glucose, Bld: 87 mg/dL (ref 70–99)
Potassium: 3.8 mmol/L (ref 3.5–5.1)
Sodium: 142 mmol/L (ref 135–145)
Total Bilirubin: 1 mg/dL (ref 0.3–1.2)
Total Protein: 6.2 g/dL — ABNORMAL LOW (ref 6.5–8.1)

## 2020-04-23 LAB — CBC WITH DIFFERENTIAL/PLATELET
Abs Immature Granulocytes: 0.01 10*3/uL (ref 0.00–0.07)
Basophils Absolute: 0 10*3/uL (ref 0.0–0.1)
Basophils Relative: 1 %
Eosinophils Absolute: 0.1 10*3/uL (ref 0.0–0.5)
Eosinophils Relative: 2 %
HCT: 35.1 % — ABNORMAL LOW (ref 36.0–46.0)
Hemoglobin: 11.8 g/dL — ABNORMAL LOW (ref 12.0–15.0)
Immature Granulocytes: 0 %
Lymphocytes Relative: 31 %
Lymphs Abs: 1.2 10*3/uL (ref 0.7–4.0)
MCH: 31.4 pg (ref 26.0–34.0)
MCHC: 33.6 g/dL (ref 30.0–36.0)
MCV: 93.4 fL (ref 80.0–100.0)
Monocytes Absolute: 0.5 10*3/uL (ref 0.1–1.0)
Monocytes Relative: 14 %
Neutro Abs: 2 10*3/uL (ref 1.7–7.7)
Neutrophils Relative %: 52 %
Platelets: 285 10*3/uL (ref 150–400)
RBC: 3.76 MIL/uL — ABNORMAL LOW (ref 3.87–5.11)
RDW: 15.5 % (ref 11.5–15.5)
WBC: 3.9 10*3/uL — ABNORMAL LOW (ref 4.0–10.5)
nRBC: 0 % (ref 0.0–0.2)

## 2020-04-23 MED ORDER — ZOLEDRONIC ACID 4 MG/100ML IV SOLN
4.0000 mg | Freq: Once | INTRAVENOUS | Status: AC
Start: 2020-04-23 — End: 2020-04-23
  Administered 2020-04-23: 4 mg via INTRAVENOUS

## 2020-04-23 MED ORDER — SODIUM CHLORIDE 0.9 % IV SOLN
Freq: Once | INTRAVENOUS | Status: AC
  Filled 2020-04-23: qty 250

## 2020-04-23 MED ORDER — ZOLEDRONIC ACID 4 MG/100ML IV SOLN
INTRAVENOUS | Status: AC
  Filled 2020-04-23: qty 100

## 2020-04-23 MED ORDER — HEPARIN SOD (PORK) LOCK FLUSH 100 UNIT/ML IV SOLN
500.0000 [IU] | Freq: Once | INTRAVENOUS | Status: AC | PRN
  Administered 2020-04-23: 500 [IU]
  Filled 2020-04-23: qty 5

## 2020-04-23 MED ORDER — SODIUM CHLORIDE 0.9 % IV SOLN
20.0000 mg | Freq: Once | INTRAVENOUS | Status: AC
  Administered 2020-04-23: 20 mg via INTRAVENOUS
  Filled 2020-04-23: qty 20

## 2020-04-23 MED ORDER — SODIUM CHLORIDE 0.9 % IV SOLN
Freq: Once | INTRAVENOUS | Status: DC
  Filled 2020-04-23: qty 250

## 2020-04-23 MED ORDER — SODIUM CHLORIDE 0.9% FLUSH
10.0000 mL | INTRAVENOUS | Status: DC | PRN
  Administered 2020-04-23: 10 mL
  Filled 2020-04-23: qty 10

## 2020-04-23 MED ORDER — DEXTROSE 5 % IV SOLN
36.0000 mg/m2 | Freq: Once | INTRAVENOUS | Status: AC
  Administered 2020-04-23: 70 mg via INTRAVENOUS
  Filled 2020-04-23: qty 30

## 2020-04-23 MED ORDER — SODIUM CHLORIDE 0.9% FLUSH
10.0000 mL | Freq: Once | INTRAVENOUS | Status: AC
  Administered 2020-04-23: 10 mL
  Filled 2020-04-23: qty 10

## 2020-04-23 NOTE — Patient Instructions (Signed)
[facility] Discharge Instructions for Patients Receiving Chemotherapy  Today you received the following chemotherapy agents Carfilzomib (KYPROLIS).  To help prevent nausea and vomiting after your treatment, we encourage you to take your nausea medication as prescribed.  If you develop nausea and vomiting that is not controlled by your nausea medication, call the clinic.   BELOW ARE SYMPTOMS THAT SHOULD BE REPORTED IMMEDIATELY:  *FEVER GREATER THAN 100.5 F  *CHILLS WITH OR WITHOUT FEVER  NAUSEA AND VOMITING THAT IS NOT CONTROLLED WITH YOUR NAUSEA MEDICATION  *UNUSUAL SHORTNESS OF BREATH  *UNUSUAL BRUISING OR BLEEDING  TENDERNESS IN MOUTH AND THROAT WITH OR WITHOUT PRESENCE OF ULCERS  *URINARY PROBLEMS  *BOWEL PROBLEMS  UNUSUAL RASH Items with * indicate a potential emergency and should be followed up as soon as possible.  Feel free to call the clinic should you have any questions or concerns. The clinic phone number is [phone].  Please show the CHEMO ALERT CARD at check-in to the Emergency Department and triage nurse.   

## 2020-04-23 NOTE — Progress Notes (Signed)
Ok to give zometa with ca = 8.4 per Dr Irene Limbo

## 2020-04-23 NOTE — Patient Instructions (Signed)

## 2020-04-23 NOTE — Progress Notes (Signed)
Per Dr. Irene Limbo, ok to proceed with Zometa today with Ca level.

## 2020-04-24 ENCOUNTER — Inpatient Hospital Stay: Payer: 59

## 2020-04-24 VITALS — BP 136/79 | HR 64 | Temp 98.4°F | Resp 16 | Wt 173.1 lb

## 2020-04-24 DIAGNOSIS — C9000 Multiple myeloma not having achieved remission: Secondary | ICD-10-CM

## 2020-04-24 DIAGNOSIS — Z5112 Encounter for antineoplastic immunotherapy: Secondary | ICD-10-CM | POA: Diagnosis not present

## 2020-04-24 DIAGNOSIS — C7951 Secondary malignant neoplasm of bone: Secondary | ICD-10-CM

## 2020-04-24 DIAGNOSIS — Z7189 Other specified counseling: Secondary | ICD-10-CM

## 2020-04-24 MED ORDER — DEXTROSE 5 % IV SOLN
36.0000 mg/m2 | Freq: Once | INTRAVENOUS | Status: AC
  Administered 2020-04-24: 70 mg via INTRAVENOUS
  Filled 2020-04-24: qty 30

## 2020-04-24 MED ORDER — SODIUM CHLORIDE 0.9% FLUSH
10.0000 mL | INTRAVENOUS | Status: DC | PRN
  Administered 2020-04-24: 10 mL
  Filled 2020-04-24: qty 10

## 2020-04-24 MED ORDER — HEPARIN SOD (PORK) LOCK FLUSH 100 UNIT/ML IV SOLN
500.0000 [IU] | Freq: Once | INTRAVENOUS | Status: AC | PRN
  Administered 2020-04-24: 500 [IU]
  Filled 2020-04-24: qty 5

## 2020-04-24 MED ORDER — PROCHLORPERAZINE MALEATE 10 MG PO TABS
10.0000 mg | ORAL_TABLET | Freq: Once | ORAL | Status: AC
Start: 2020-04-24 — End: 2020-04-24
  Administered 2020-04-24: 10 mg via ORAL

## 2020-04-24 MED ORDER — PROCHLORPERAZINE MALEATE 10 MG PO TABS
ORAL_TABLET | ORAL | Status: AC
  Filled 2020-04-24: qty 1

## 2020-04-24 MED ORDER — SODIUM CHLORIDE 0.9 % IV SOLN
Freq: Once | INTRAVENOUS | Status: DC
  Filled 2020-04-24: qty 250

## 2020-04-24 MED ORDER — SODIUM CHLORIDE 0.9 % IV SOLN
Freq: Once | INTRAVENOUS | Status: AC
Start: 2020-04-24 — End: 2020-04-24
  Filled 2020-04-24: qty 250

## 2020-04-24 MED ORDER — DIPHENHYDRAMINE HCL 25 MG PO CAPS
ORAL_CAPSULE | ORAL | Status: AC
  Filled 2020-04-24: qty 1

## 2020-04-24 MED ORDER — ACETAMINOPHEN 325 MG PO TABS
ORAL_TABLET | ORAL | Status: AC
  Filled 2020-04-24: qty 2

## 2020-04-24 NOTE — Patient Instructions (Signed)
[facility] Discharge Instructions for Patients Receiving Chemotherapy  Today you received the following chemotherapy agents: Carfilzomib (Kyprolis)  To help prevent nausea and vomiting after your treatment, we encourage you to take your nausea medication as directed.    If you develop nausea and vomiting that is not controlled by your nausea medication, call the clinic.   BELOW ARE SYMPTOMS THAT SHOULD BE REPORTED IMMEDIATELY:  *FEVER GREATER THAN 100.5 F  *CHILLS WITH OR WITHOUT FEVER  NAUSEA AND VOMITING THAT IS NOT CONTROLLED WITH YOUR NAUSEA MEDICATION  *UNUSUAL SHORTNESS OF BREATH  *UNUSUAL BRUISING OR BLEEDING  TENDERNESS IN MOUTH AND THROAT WITH OR WITHOUT PRESENCE OF ULCERS  *URINARY PROBLEMS  *BOWEL PROBLEMS  UNUSUAL RASH Items with * indicate a potential emergency and should be followed up as soon as possible.  Feel free to call the clinic should you have any questions or concerns. The clinic phone number is [phone].  Please show the CHEMO ALERT CARD at check-in to the Emergency Department and triage nurse.   

## 2020-04-25 ENCOUNTER — Other Ambulatory Visit (HOSPITAL_COMMUNITY): Payer: Self-pay | Admitting: Neurosurgery

## 2020-04-25 ENCOUNTER — Other Ambulatory Visit: Payer: Self-pay | Admitting: *Deleted

## 2020-04-25 ENCOUNTER — Other Ambulatory Visit: Payer: Self-pay | Admitting: Neurosurgery

## 2020-04-25 DIAGNOSIS — Z7189 Other specified counseling: Secondary | ICD-10-CM

## 2020-04-25 DIAGNOSIS — C9030 Solitary plasmacytoma not having achieved remission: Secondary | ICD-10-CM

## 2020-04-25 DIAGNOSIS — C9000 Multiple myeloma not having achieved remission: Secondary | ICD-10-CM

## 2020-04-25 LAB — URINE CULTURE

## 2020-04-25 MED ORDER — LENALIDOMIDE 25 MG PO CAPS: ORAL_CAPSULE | ORAL | 0 refills | Status: DC

## 2020-04-29 NOTE — Progress Notes (Signed)
HEMATOLOGY/ONCOLOGY CLNIC NOTE  Date of Service: 04/29/2020  Patient Care Team: [name], FNP as PCP - General (General Practice)  CHIEF COMPLAINTS/PURPOSE OF CONSULTATION:  Multiple myeloma- continued mx  HISTORY OF PRESENTING ILLNESS:   Alexandra [name] is a wonderful 55 y.o. female who has been referred to [place] by Dr. 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 The pt reports that she is doing well overall.   The pt reports that she was taking Benazepril-HCTZ for her HTN but was told to discontinue as she was becoming hypotensive while in the hospital. She continues taking Lipitor for her HLD and was previously on Ozempic for her Type 2 Diabetes. She has no significant family history of cancers or blood disorders. Pt has no known medication allergies. Pt had a hammer toe surgery and a tubal ligation. 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   In mid-June she began to notice a growth on the left side of her forehead. Because the lump continued to grow she visited her PCP at the end of June. Her PCP and Dermatologist thought that it was a lipoma initally. She was sent for a consult with Dr. Harlow Mares at [facility] in August. She received her first cranial MRI on 10/05/2019, which revealed a mass and bone erosion. Pt had her initial Craniectomy on 10/11/2019 and a Left Bifrontal Cranioplasty on 10/14/2019. Pt denies any new bone pain, fevers, chills, or night sweats at the time. Pt has lost 40 lbs over the last year, but this was after the addition of Ozempic and improved dietary and exercise habits. She has started regaining weight after holding Ozempic. At this time pt reports mild discomfort at surgical site and no other pertinent symptoms.  Of note prior to the patient's visit today, pt has had Surgical Pathology [id number]) completed on 10/10/2019 with results revealing "A.  BRAIN TUMOR, LEFT FRONTAL, RESECTION: - Plasma cell neoplasm B. BRAIN TUMOR, LEFT FRONTAL, RESECTION: - Plasma cell neoplasm C. BONE, LEFT FRONTAL, RESECTION: - Plasma cell neoplasm."   Pt has had MRI Brain ([id number]) completed on 10/11/2019 with results revealing "No complicating feature or residual tumor seen after left frontal bone mass resection."  Pt has had MRI Brain ([id number]) completed on 10/05/2019 with results revealing "1. Large extra-axial mass in the anterior left frontal region with bone erosion and extension into the scalp soft tissues. Considerations include hemangiopericytoma, metastasis, and aggressive meningioma. 2. 6 mm enhancing lesion in the clivus. This is indeterminate but does raise concern for metastatic disease. 3. Mild chronic small vessel ischemic disease."  Most recent lab results (10/11/2019) of CBC & BMP is as follows: all values are WNL except for WBC at 16.6K, RBC at 2.99, Hgb at 9.1, HCT at 26.5, Glucose at 104, Calcium at 8.4.  On review of systems, pt reports discomfort at surgicalsite and denies balance issues, memory loss, headaches, fevers, chills, unexpected weight loss and any othe symptoms.   On PMHx the pt reports HTN, HLD, Diabetes, Tubal Ligation, Hammer Toe Surgery, Herpes. On Social Hx the pt reports that she previously smoked on and off but is not currently smoking and does not drink much alcohol. On Family Hx the pt reports a grandfather with prostate cancer.  INTERVAL HISTORY:  Alexandra [name] is a wonderful 55 y.o. 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 The pt reports that she is  doing well overall. She is here for C6D1 of KRD and next dose of Zometa. Labs stable. Patient notes some increased urination.  On review of systems, pt reports back pain in relation to movement, frequent urination at nighttime and denies discomfort / pain while urinating, frequent urination in daytime, abdominal pain,  leg swelling and any other symptoms.  MEDICAL HISTORY:  Past Medical History:  Diagnosis Date  . Anxiety   . Hypertension   . Pre-diabetes     SURGICAL HISTORY: Past Surgical History:  Procedure Laterality Date  . ABLATION    . APPLICATION OF CRANIAL NAVIGATION N/A 10/10/2019   Procedure: APPLICATION OF CRANIAL NAVIGATION;  Surgeon: Consuella Lose, MD;  Location: [place];  Service: Neurosurgery;  Laterality: N/A;  APPLICATION OF CRANIAL NAVIGATION  . CRANIOPLASTY Left 10/14/2019   Procedure: LEFT CRANIOPLASTY WITH PLACEMENT OF CUSTOM CRANIAL IMPLANT;  Surgeon: Consuella Lose, MD;  Location: [place];  Service: Neurosurgery;  Laterality: Left;  . CRANIOTOMY Left 10/10/2019   Procedure: Left frontal Stereotactic craniectomy for resection of tumor;  Surgeon: Consuella Lose, MD;  Location: [place];  Service: Neurosurgery;  Laterality: Left;  Left frontal Stereotactic craniectomy for resection of tumor  . HAMMER TOE SURGERY Left   . IR IMAGING GUIDED PORT INSERTION  12/01/2019  . TUBAL LIGATION      SOCIAL HISTORY: Social History   Socioeconomic History  . Marital status: Single    Spouse name: Not on file  . Number of children: Not on file  . Years of education: Not on file  . Highest education level: Not on file  Occupational History  . Not on file  Tobacco Use  . Smoking status: Never Smoker  . Smokeless tobacco: Never Used  Vaping Use  . Vaping Use: Never used  Substance and Sexual Activity  . Alcohol use: Yes    Comment: occassionally  . Drug use: Not Currently  . Sexual activity: Not on file  Other Topics Concern  . Not on file  Social History Narrative  . Not on file   Social Determinants of Health   Financial Resource Strain: Not on file  Food Insecurity: Not on file  Transportation Needs: Not on file  Physical Activity: Not on file  Stress: Not on file  Social Connections: Not on file  Intimate Partner Violence: Not on file    FAMILY  HISTORY: Family History  Problem Relation Age of Onset  . Breast cancer Maternal Grandmother   . Heart disease Mother   . Hypertension Mother   . Sarcoidosis Father   . Diabetes Maternal Grandfather     ALLERGIES:  has No Known Allergies.  MEDICATIONS:  Current Outpatient Medications  Medication Sig Dispense Refill  . acetaminophen (TYLENOL) 650 MG CR tablet Take 1,300 mg by mouth every 8 (eight) hours as needed for pain.    Marland Kitchen aspirin EC 81 MG tablet Take 1 tablet (81 mg total) by mouth daily. 100 tablet 3  . atorvastatin (LIPITOR) 10 MG tablet TAKE 1 TABLET BY MOUTH EVERY DAY 90 tablet 1  . Cholecalciferol (VITAMIN D) 50 MCG (2000 UT) tablet Take 2,000 Units by mouth daily.    . Coenzyme Q10 (COQ-10) 100 MG CAPS Take 200 mg by mouth daily.    Marland Kitchen dexamethasone (DECADRON) 4 MG tablet TAKE 10 TABLETS BY MOUTH ONCE ON DAY 22. REPEAT EVERY 28 DAYS FOR THE FIRST 4 CYCLES. TAKE WITH BREAKFAST 40 tablet 4  . Eszopiclone 3 MG TABS Take 1 tablet (3 mg  total) by mouth at bedtime as needed (sleep). Take immediately before bedtime 30 tablet 0  . Insulin Pen Needle (NOVOFINE PLUS) 32G X 4 MM MISC Inject 1 pen into the skin daily. Inject 1 pen with Victoza SQ 100 each 3  . lenalidomide (REVLIMID) 25 MG capsule TAKE 1 CAPSULE BY MOUTH EVERY DAY FOR 21 DAYS ON, THEN 7 DAYS OFF 21 capsule 0  . levocetirizine (XYZAL) 5 MG tablet TAKE 1 TABLET BY MOUTH EVERY DAY IN THE EVENING 90 tablet 3  . lidocaine-prilocaine (EMLA) cream APPLY TO AFFECTED AREA ONCE AS DIRECTED (Patient taking differently: Apply 1 application topically daily as needed (port access).) 30 g 3  . LORazepam (ATIVAN) 0.5 MG tablet TAKE 1 TABLET BY MOUTH EVERY 6 HOURS AS NEEDED (NAUSEA OR VOMITING). 30 tablet 0  . Multiple Vitamins-Minerals (IMMUNE SUPPORT PO) Take 2 tablets by mouth daily.    . ondansetron (ZOFRAN) 8 MG tablet TAKE 1 TABLET BY MOUTH TWICE DAILY AS NEEDED FOR NAUSEA AND/OR VOMITING 30 tablet 1  . Oxymetazoline HCl (AFRIN  NODRIP SEVERE CONGEST NA) Place 1 spray into the nose daily as needed (congestion).    Marland Kitchen OZEMPIC, 0.25 OR 0.5 MG/DOSE, 2 MG/1.5ML SOPN INJECT 0.5 MG TOTAL INTO THE SKIN ONCE A WEEK. (Patient taking differently: Inject 0.5 mg as directed every Friday.) 4.5 mL 1  . prochlorperazine (COMPAZINE) 10 MG tablet TAKE 1 TABLET BY MOUTH EVERY 6 HOURS AS NEEDED (NAUSEA OR VOMITING). 30 tablet 1  . senna (SENOKOT) 8.6 MG TABS tablet Take 17.2 mg by mouth daily as needed for mild constipation.    . traZODone (DESYREL) 50 MG tablet TAKE 1-2 TABLETS BY MOUTH AT BEDTIME AS NEEDED FOR SLEEP. 180 tablet 1  . Turmeric Curcumin 500 MG CAPS Take 500 mg by mouth daily.    . valACYclovir (VALTREX) 1000 MG tablet TAKE 1 TABLET BY MOUTH EVERY DAY AS NEEDED 90 tablet 1  . vitamin B-12 (CYANOCOBALAMIN) 1000 MCG tablet Take 1,000 mcg by mouth daily.     No current facility-administered medications for this visit.    REVIEW OF SYSTEMS:   10 Point review of Systems was done is negative except as noted above.  PHYSICAL EXAMINATION: ECOG PERFORMANCE STATUS: 1 - Symptomatic but completely ambulatory  . Vitals:   04/23/20 0924  BP: 112/71  Pulse: 64  Resp: 14  Temp: 97.8 F (36.6 C)  SpO2: 100%   Filed Weights   04/23/20 0924  Weight: 171 lb 6.4 oz (77.7 kg)   .Body mass index is 26.85 kg/m.   GENERAL:alert, in no acute distress and comfortable SKIN: no acute rashes, no significant lesions EYES: conjunctiva are pink and non-injected, sclera anicteric OROPHARYNX: MMM, no exudates, no oropharyngeal erythema or ulceration NECK: supple, no JVD LYMPH:  no palpable lymphadenopathy in the cervical, axillary or inguinal regions LUNGS: clear to auscultation b/l with normal respiratory effort HEART: regular rate & rhythm ABDOMEN:  normoactive bowel sounds , non tender, not distended. Extremity: no pedal edema PSYCH: alert & oriented x 3 with fluent speech NEURO: no focal motor/sensory deficits  LABORATORY DATA:   I have reviewed the data as listed  CBC Latest Ref Rng & Units 04/23/2020 04/09/2020 04/02/2020  WBC 4.0 - 10.5 K/uL 3.9(L) 4.0 4.5  Hemoglobin 12.0 - 15.0 g/dL 11.8(L) 11.5(L) 11.2(L)  Hematocrit 36.0 - 46.0 % 35.1(L) 34.3(L) 33.6(L)  Platelets 150 - 400 K/uL 285 192 175    CMP Latest Ref Rng & Units 04/23/2020 04/09/2020 04/02/2020  Glucose  70 - 99 mg/dL 87 80 88  BUN 6 - 20 mg/dL 13 9 11   Creatinine 0.44 - 1.00 mg/dL 0.78 0.76 0.76  Sodium 135 - 145 mmol/L 142 140 141  Potassium 3.5 - 5.1 mmol/L 3.8 4.0 4.0  Chloride 98 - 111 mmol/L 107 107 107  CO2 22 - 32 mmol/L 26 27 26   Calcium 8.9 - 10.3 mg/dL 8.4(L) 8.8(L) 8.8(L)  Total Protein 6.5 - 8.1 g/dL 6.2(L) 6.0(L) 6.0(L)  Total Bilirubin 0.3 - 1.2 mg/dL 1.0 0.9 0.8  Alkaline Phos 38 - 126 U/L 49 47 41  AST 15 - 41 U/L 17 18 13(L)  ALT 0 - 44 U/L 16 21 12    11/08/2019 FISH Plasma Cell Myeloma Prognostic Panel:    11/08/2019 Cytogenetics:    11/08/2019 Bone Marrow Report [id number]):   10/10/2019 Surgical Pathology [phone]):   RADIOGRAPHIC STUDIES:  I have personally reviewed the radiological images as listed and agreed with the findings in the report. No results found.  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 No M spike. 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 -Advised pt that she is in remission due to her last light chains being normalized. Advised pt our goal is to get as deep a response as possible and increase time to progression. -Will f/u w [facility] regarding appt with Dr Samule Ohm for consideration of HDT-AutoHSCT -The pt has no prohibitive  toxicities from continuing [place] at this time. Continue Carflizomib at 36 mg/m^2. -The pt has no prohibitive toxicities from continuing 25 mg Revlimid 3 weeks on/1 week off. -Continue daily baby ASA, Valtrex, & B complex. -Continue Zometa q4weeks  -Will see back in 4 weeks with C6D1.   FOLLOW UP: Please schedule for cycle 7 of carfilzomib as per orders PET/CT in 10 days Bone marrow biopsy in 10 days MD visit with cycle 7-day 1 in 4 weeks  The total time spent in the appointment was 30 minutes and more than 50% was on counseling and direct patient cares.   All of the patient's questions were answered with apparent satisfaction. The patient knows to call the clinic with any problems, questions or concerns.    Sullivan Lone MD [place] AAHIVMS [facility] Hematology/Oncology Physician [facility]  (Office):       [phone] (Work cell):  [phone] (Fax):           [phone]  04/29/2020 11:07 PM  I, Reinaldo Raddle, am acting as scribe for Dr. Sullivan Lone, MD.    .I have reviewed the above documentation for accuracy and completeness, and I agree with the above. Brunetta Genera MD

## 2020-04-30 ENCOUNTER — Inpatient Hospital Stay: Payer: 59

## 2020-04-30 ENCOUNTER — Other Ambulatory Visit: Payer: Self-pay

## 2020-04-30 VITALS — BP 145/78 | HR 64 | Temp 98.8°F | Resp 18 | Ht 67.0 in | Wt 175.2 lb

## 2020-04-30 DIAGNOSIS — C9000 Multiple myeloma not having achieved remission: Secondary | ICD-10-CM

## 2020-04-30 DIAGNOSIS — C7951 Secondary malignant neoplasm of bone: Secondary | ICD-10-CM

## 2020-04-30 DIAGNOSIS — Z5112 Encounter for antineoplastic immunotherapy: Secondary | ICD-10-CM | POA: Diagnosis not present

## 2020-04-30 DIAGNOSIS — Z7189 Other specified counseling: Secondary | ICD-10-CM

## 2020-04-30 LAB — CMP (CANCER CENTER ONLY)
ALT: 17 U/L (ref 0–44)
AST: 19 U/L (ref 15–41)
Albumin: 3.7 g/dL (ref 3.5–5.0)
Alkaline Phosphatase: 51 U/L (ref 38–126)
Anion gap: 11 (ref 5–15)
BUN: 10 mg/dL (ref 6–20)
CO2: 28 mmol/L (ref 22–32)
Calcium: 9.7 mg/dL (ref 8.9–10.3)
Chloride: 104 mmol/L (ref 98–111)
Creatinine: 0.81 mg/dL (ref 0.44–1.00)
GFR, Estimated: >60 mL/min (ref >60)
Glucose, Bld: 100 mg/dL — ABNORMAL HIGH (ref 70–99)
Potassium: 4 mmol/L (ref 3.5–5.1)
Sodium: 143 mmol/L (ref 135–145)
Total Bilirubin: 0.9 mg/dL (ref 0.3–1.2)
Total Protein: 6.1 g/dL — ABNORMAL LOW (ref 6.5–8.1)

## 2020-04-30 LAB — CBC WITH DIFFERENTIAL/PLATELET
Abs Immature Granulocytes: 0.02 10*3/uL (ref 0.00–0.07)
Basophils Absolute: 0 10*3/uL (ref 0.0–0.1)
Basophils Relative: 1 %
Eosinophils Absolute: 0.2 10*3/uL (ref 0.0–0.5)
Eosinophils Relative: 5 %
HCT: 34.3 % — ABNORMAL LOW (ref 36.0–46.0)
Hemoglobin: 11.5 g/dL — ABNORMAL LOW (ref 12.0–15.0)
Immature Granulocytes: 1 %
Lymphocytes Relative: 23 %
Lymphs Abs: 1 10*3/uL (ref 0.7–4.0)
MCH: 31.4 pg (ref 26.0–34.0)
MCHC: 33.5 g/dL (ref 30.0–36.0)
MCV: 93.7 fL (ref 80.0–100.0)
Monocytes Absolute: 0.5 10*3/uL (ref 0.1–1.0)
Monocytes Relative: 11 %
Neutro Abs: 2.6 10*3/uL (ref 1.7–7.7)
Neutrophils Relative %: 59 %
Platelets: 165 10*3/uL (ref 150–400)
RBC: 3.66 MIL/uL — ABNORMAL LOW (ref 3.87–5.11)
RDW: 15.2 % (ref 11.5–15.5)
WBC: 4.4 10*3/uL (ref 4.0–10.5)
nRBC: 0 % (ref 0.0–0.2)

## 2020-04-30 MED ORDER — SODIUM CHLORIDE 0.9 % IV SOLN
20.0000 mg | Freq: Once | INTRAVENOUS | Status: AC
  Administered 2020-04-30: 20 mg via INTRAVENOUS
  Filled 2020-04-30: qty 20

## 2020-04-30 MED ORDER — DEXTROSE 5 % IV SOLN
36.0000 mg/m2 | Freq: Once | INTRAVENOUS | Status: AC
  Administered 2020-04-30: 70 mg via INTRAVENOUS
  Filled 2020-04-30: qty 5

## 2020-04-30 MED ORDER — HEPARIN SOD (PORK) LOCK FLUSH 100 UNIT/ML IV SOLN
500.0000 [IU] | Freq: Once | INTRAVENOUS | Status: AC | PRN
  Administered 2020-04-30: 500 [IU]
  Filled 2020-04-30: qty 5

## 2020-04-30 MED ORDER — SODIUM CHLORIDE 0.9 % IV SOLN
Freq: Once | INTRAVENOUS | Status: AC
  Filled 2020-04-30: qty 250

## 2020-04-30 MED ORDER — SODIUM CHLORIDE 0.9 % IV SOLN
Freq: Once | INTRAVENOUS | Status: DC
  Filled 2020-04-30: qty 250

## 2020-04-30 MED ORDER — SODIUM CHLORIDE 0.9% FLUSH
10.0000 mL | INTRAVENOUS | Status: DC | PRN
  Administered 2020-04-30: 10 mL
  Filled 2020-04-30: qty 10

## 2020-04-30 NOTE — Patient Instructions (Signed)
[facility] Discharge Instructions for Patients Receiving Chemotherapy  Today you received the following chemotherapy agents:  Kyprolis  To help prevent nausea and vomiting after your treatment, we encourage you to take your nausea medication as directed.   If you develop nausea and vomiting that is not controlled by your nausea medication, call the clinic.   BELOW ARE SYMPTOMS THAT SHOULD BE REPORTED IMMEDIATELY:  *FEVER GREATER THAN 100.5 F  *CHILLS WITH OR WITHOUT FEVER  NAUSEA AND VOMITING THAT IS NOT CONTROLLED WITH YOUR NAUSEA MEDICATION  *UNUSUAL SHORTNESS OF BREATH  *UNUSUAL BRUISING OR BLEEDING  TENDERNESS IN MOUTH AND THROAT WITH OR WITHOUT PRESENCE OF ULCERS  *URINARY PROBLEMS  *BOWEL PROBLEMS  UNUSUAL RASH Items with * indicate a potential emergency and should be followed up as soon as possible.  Feel free to call the clinic should you have any questions or concerns. The clinic phone number is [phone].  Please show the CHEMO ALERT CARD at check-in to the Emergency Department and triage nurse.   

## 2020-05-01 ENCOUNTER — Inpatient Hospital Stay: Payer: 59

## 2020-05-01 VITALS — BP 161/88 | HR 57 | Temp 98.4°F | Resp 18

## 2020-05-01 DIAGNOSIS — Z7189 Other specified counseling: Secondary | ICD-10-CM

## 2020-05-01 DIAGNOSIS — Z5112 Encounter for antineoplastic immunotherapy: Secondary | ICD-10-CM | POA: Diagnosis not present

## 2020-05-01 DIAGNOSIS — C9000 Multiple myeloma not having achieved remission: Secondary | ICD-10-CM

## 2020-05-01 DIAGNOSIS — C7951 Secondary malignant neoplasm of bone: Secondary | ICD-10-CM

## 2020-05-01 LAB — MULTIPLE MYELOMA PANEL, SERUM
Albumin SerPl Elph-Mcnc: 3.6 g/dL (ref 2.9–4.4)
Albumin/Glob SerPl: 1.6 (ref 0.7–1.7)
Alpha 1: 0.2 g/dL (ref 0.0–0.4)
Alpha2 Glob SerPl Elph-Mcnc: 0.7 g/dL (ref 0.4–1.0)
B-Globulin SerPl Elph-Mcnc: 0.9 g/dL (ref 0.7–1.3)
Gamma Glob SerPl Elph-Mcnc: 0.5 g/dL (ref 0.4–1.8)
Globulin, Total: 2.3 g/dL (ref 2.2–3.9)
IgA: 44 mg/dL — ABNORMAL LOW (ref 87–352)
IgG (Immunoglobin G), Serum: 519 mg/dL — ABNORMAL LOW (ref 586–1602)
IgM (Immunoglobulin M), Srm: 13 mg/dL — ABNORMAL LOW (ref 26–217)
Total Protein ELP: 5.9 g/dL — ABNORMAL LOW (ref 6.0–8.5)

## 2020-05-01 LAB — KAPPA/LAMBDA LIGHT CHAINS
Kappa free light chain: 10.1 mg/L (ref 3.3–19.4)
Kappa, lambda light chain ratio: 0.72 (ref 0.26–1.65)
Lambda free light chains: 14 mg/L (ref 5.7–26.3)

## 2020-05-01 MED ORDER — DEXTROSE 5 % IV SOLN
36.0000 mg/m2 | Freq: Once | INTRAVENOUS | Status: AC
  Administered 2020-05-01: 70 mg via INTRAVENOUS
  Filled 2020-05-01: qty 30

## 2020-05-01 MED ORDER — SODIUM CHLORIDE 0.9% FLUSH
10.0000 mL | INTRAVENOUS | Status: DC | PRN
  Administered 2020-05-01: 10 mL
  Filled 2020-05-01: qty 10

## 2020-05-01 MED ORDER — HEPARIN SOD (PORK) LOCK FLUSH 100 UNIT/ML IV SOLN
500.0000 [IU] | Freq: Once | INTRAVENOUS | Status: AC | PRN
  Administered 2020-05-01: 500 [IU]
  Filled 2020-05-01: qty 5

## 2020-05-01 MED ORDER — SODIUM CHLORIDE 0.9 % IV SOLN
Freq: Once | INTRAVENOUS | Status: AC
  Filled 2020-05-01: qty 250

## 2020-05-01 MED ORDER — PROCHLORPERAZINE MALEATE 10 MG PO TABS
10.0000 mg | ORAL_TABLET | Freq: Once | ORAL | Status: AC
  Administered 2020-05-01: 10 mg via ORAL

## 2020-05-01 MED ORDER — PROCHLORPERAZINE MALEATE 10 MG PO TABS
ORAL_TABLET | ORAL | Status: AC
  Filled 2020-05-01: qty 1

## 2020-05-01 NOTE — Patient Instructions (Signed)
[facility] Discharge Instructions for Patients Receiving Chemotherapy  Today you received the following chemotherapy agents:  Kyprolis  To help prevent nausea and vomiting after your treatment, we encourage you to take your nausea medication as directed.   If you develop nausea and vomiting that is not controlled by your nausea medication, call the clinic.   BELOW ARE SYMPTOMS THAT SHOULD BE REPORTED IMMEDIATELY:  *FEVER GREATER THAN 100.5 F  *CHILLS WITH OR WITHOUT FEVER  NAUSEA AND VOMITING THAT IS NOT CONTROLLED WITH YOUR NAUSEA MEDICATION  *UNUSUAL SHORTNESS OF BREATH  *UNUSUAL BRUISING OR BLEEDING  TENDERNESS IN MOUTH AND THROAT WITH OR WITHOUT PRESENCE OF ULCERS  *URINARY PROBLEMS  *BOWEL PROBLEMS  UNUSUAL RASH Items with * indicate a potential emergency and should be followed up as soon as possible.  Feel free to call the clinic should you have any questions or concerns. The clinic phone number is [phone].  Please show the CHEMO ALERT CARD at check-in to the Emergency Department and triage nurse.   

## 2020-05-03 ENCOUNTER — Other Ambulatory Visit: Payer: Self-pay

## 2020-05-03 ENCOUNTER — Encounter: Payer: Self-pay | Admitting: Nurse Practitioner

## 2020-05-03 ENCOUNTER — Ambulatory Visit (INDEPENDENT_AMBULATORY_CARE_PROVIDER_SITE_OTHER): Payer: 59 | Admitting: Nurse Practitioner

## 2020-05-03 VITALS — BP 134/70 | HR 65 | Temp 98.3°F | Ht 66.4 in | Wt 175.4 lb

## 2020-05-03 DIAGNOSIS — E782 Mixed hyperlipidemia: Secondary | ICD-10-CM | POA: Diagnosis not present

## 2020-05-03 DIAGNOSIS — R351 Nocturia: Secondary | ICD-10-CM

## 2020-05-03 DIAGNOSIS — R7303 Prediabetes: Secondary | ICD-10-CM

## 2020-05-03 DIAGNOSIS — I10 Essential (primary) hypertension: Secondary | ICD-10-CM | POA: Diagnosis not present

## 2020-05-03 DIAGNOSIS — C9001 Multiple myeloma in remission: Secondary | ICD-10-CM | POA: Diagnosis not present

## 2020-05-03 MED ORDER — OZEMPIC (1 MG/DOSE) 4 MG/3ML ~~LOC~~ SOPN: 1.0000 mg | PEN_INJECTOR | SUBCUTANEOUS | 1 refills | Status: DC

## 2020-05-03 MED ORDER — BENAZEPRIL-HYDROCHLOROTHIAZIDE 20-12.5 MG PO TABS
1.0000 | ORAL_TABLET | Freq: Every day | ORAL | 1 refills | Status: DC
Start: 2020-05-03 — End: 2020-09-05

## 2020-05-03 NOTE — Progress Notes (Signed)
Rutherford Nail as a scribe for [name], FNP.,have documented all relevant documentation on the behalf of [name], FNP,as directed by  [name], FNP while in the presence of [name], [place]. This visit occurred during the SARS-CoV-2 public health emergency.  Safety protocols were in place, including screening questions prior to the visit, additional usage of staff PPE, and extensive cleaning of exam room while observing appropriate contact time as indicated for disinfecting solutions.  Subjective:     Patient ID: Alexandra [name] , female    DOB: [birth year] , 55 y.o.   MRN: [id number]   Chief Complaint  Patient presents with  . Hypertension    HPI  Patient here for a blood pressure f/u  She is planning for a bone marrow transplant will go to [facility] next week. revilimid and iv chemo - she is getting 2 days a week for 3 weeks take a rest, until June started in November.  She has an MRI scheduled for Saturday underneath her prosthesis  She will occasionally have low back pain and she is to have a PET scan and possible biopsy to her back.   Blood pressure has been increasing over the last month. She began taking benazapril 1/2 tab then began 1 tab daily.    Hypertension This is a chronic problem. The current episode started more than 1 year ago. The problem has been gradually worsening since onset. The problem is uncontrolled. Pertinent negatives include no anxiety, blurred vision, chest pain, headaches or palpitations. There are no associated agents to hypertension. Risk factors for coronary artery disease include obesity. Past treatments include diuretics and angiotensin blockers. The current treatment provides no improvement. There are no compliance problems.  There is no history of angina. There is no history of chronic renal disease.  Diabetes She presents for her follow-up diabetic visit. She has type 2 diabetes mellitus. No MedicAlert identification noted. Her  disease course has been stable. Pertinent negatives for hypoglycemia include no dizziness or headaches. Pertinent negatives for diabetes include no blurred vision, no chest pain, no fatigue, no polydipsia, no polyphagia and no polyuria. Symptoms are stable. There are no diabetic complications. Risk factors for coronary artery disease include hypertension and diabetes mellitus. Current diabetic treatments: Was on Victoza until May, no insurance since then. She is compliant with treatment all of the time. She is following a generally healthy diet. When asked about meal planning, she reported none. She has not had a previous visit with a dietitian. An ACE inhibitor/angiotensin II receptor blocker is being taken. She does not see a podiatrist.Eye exam is not current.     Past Medical History:  Diagnosis Date  . Anxiety   . Hypertension   . Pre-diabetes      Family History  Problem Relation Age of Onset  . Breast cancer Maternal Grandmother   . Heart disease Mother   . Hypertension Mother   . Sarcoidosis Father   . Diabetes Maternal Grandfather      Current Outpatient Medications:  .  acetaminophen (TYLENOL) 650 MG CR tablet, Take 1,300 mg by mouth every 8 (eight) hours as needed for pain., Disp: , Rfl:  .  aspirin EC 81 MG tablet, Take 1 tablet (81 mg total) by mouth daily., Disp: 100 tablet, Rfl: 3 .  atorvastatin (LIPITOR) 10 MG tablet, TAKE 1 TABLET BY MOUTH EVERY DAY, Disp: 90 tablet, Rfl: 1 .  Cholecalciferol (VITAMIN D) 50 MCG (2000 UT) tablet, Take 2,000 Units by mouth daily.,  Disp: , Rfl:  .  Coenzyme Q10 (COQ-10) 100 MG CAPS, Take 200 mg by mouth daily., Disp: , Rfl:  .  dexamethasone (DECADRON) 4 MG tablet, TAKE 10 TABLETS BY MOUTH ONCE ON DAY 22. REPEAT EVERY 28 DAYS FOR THE FIRST 4 CYCLES. TAKE WITH BREAKFAST, Disp: 40 tablet, Rfl: 4 .  Insulin Pen Needle (NOVOFINE PLUS) 32G X 4 MM MISC, Inject 1 pen into the skin daily. Inject 1 pen with Victoza SQ, Disp: 100 each, Rfl: 3 .   lenalidomide (REVLIMID) 25 MG capsule, TAKE 1 CAPSULE BY MOUTH EVERY DAY FOR 21 DAYS ON, THEN 7 DAYS OFF, Disp: 21 capsule, Rfl: 0 .  levocetirizine (XYZAL) 5 MG tablet, TAKE 1 TABLET BY MOUTH EVERY DAY IN THE EVENING, Disp: 90 tablet, Rfl: 3 .  lidocaine-prilocaine (EMLA) cream, APPLY TO AFFECTED AREA ONCE AS DIRECTED (Patient taking differently: Apply 1 application topically daily as needed (port access).), Disp: 30 g, Rfl: 3 .  LORazepam (ATIVAN) 0.5 MG tablet, TAKE 1 TABLET BY MOUTH EVERY 6 HOURS AS NEEDED (NAUSEA OR VOMITING)., Disp: 30 tablet, Rfl: 0 .  Multiple Vitamins-Minerals (IMMUNE SUPPORT PO), Take 2 tablets by mouth daily., Disp: , Rfl:  .  Oxymetazoline HCl (AFRIN NODRIP SEVERE CONGEST NA), Place 1 spray into the nose daily as needed (congestion)., Disp: , Rfl:  .  prochlorperazine (COMPAZINE) 10 MG tablet, TAKE 1 TABLET BY MOUTH EVERY 6 HOURS AS NEEDED (NAUSEA OR VOMITING)., Disp: 30 tablet, Rfl: 1 .  Semaglutide, 1 MG/DOSE, (OZEMPIC, 1 MG/DOSE,) 4 MG/3ML SOPN, Inject 1 mg into the skin once a week., Disp: 9 mL, Rfl: 1 .  senna (SENOKOT) 8.6 MG TABS tablet, Take 17.2 mg by mouth daily as needed for mild constipation., Disp: , Rfl:  .  traZODone (DESYREL) 50 MG tablet, TAKE 1-2 TABLETS BY MOUTH AT BEDTIME AS NEEDED FOR SLEEP., Disp: 180 tablet, Rfl: 1 .  Turmeric Curcumin 500 MG CAPS, Take 500 mg by mouth daily., Disp: , Rfl:  .  valACYclovir (VALTREX) 1000 MG tablet, TAKE 1 TABLET BY MOUTH EVERY DAY AS NEEDED, Disp: 90 tablet, Rfl: 1 .  vitamin B-12 (CYANOCOBALAMIN) 1000 MCG tablet, Take 1,000 mcg by mouth daily., Disp: , Rfl:  .  benazepril-hydrochlorthiazide (LOTENSIN HCT) 20-12.5 MG tablet, Take 1 tablet by mouth daily., Disp: 90 tablet, Rfl: 1 .  ondansetron (ZOFRAN) 8 MG tablet, TAKE 1 TABLET BY MOUTH TWICE DAILY AS NEEDED FOR NAUSEA AND/OR VOMITING, Disp: 30 tablet, Rfl: 1   No Known Allergies   Review of Systems  Constitutional: Negative for fatigue.  Eyes: Negative for  blurred vision.  Respiratory: Negative.   Cardiovascular: Negative for chest pain, palpitations and leg swelling.  Endocrine: Negative for polydipsia, polyphagia and polyuria.  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  Psychiatric/Behavioral: Negative.      Today's Vitals   05/03/20 0934  BP: 134/70  Pulse: 65  Temp: 98.3 F (36.8 C)  TempSrc: Oral  Weight: 175 lb 6.4 oz (79.6 kg)  Height: 5' 6.4" (1.687 m)  PainSc: 0-No pain   Body mass index is 27.97 kg/m.   Objective:  Physical Exam Vitals reviewed.  Constitutional:      General: She is not in acute distress.    Appearance: Normal appearance.  Cardiovascular:     Rate and Rhythm: Normal rate.     Pulses: Normal pulses.  Heart sounds: Normal heart sounds. No murmur heard.   Pulmonary:     Effort: Pulmonary effort is normal. No respiratory distress.     Breath sounds: Normal breath sounds. No wheezing.  Neurological:     General: No focal deficit present.     Mental Status: She is alert and oriented to person, place, and time.     Cranial Nerves: No cranial nerve deficit.     Motor: Weakness present.  Psychiatric:        Mood and Affect: Mood normal.        Behavior: Behavior normal.        Thought Content: Thought content normal.        Judgment: Judgment normal.         Assessment And Plan:      1. Essential hypertension Chronic, good control   Continue with current medications - benazepril-hydrochlorthiazide (LOTENSIN HCT) 20-12.5 MG tablet; Take 1 tablet by mouth daily.  Dispense: 90 tablet; Refill: 1  2. Prediabetes  Chronic, she has continued to take Ozempic and has been doing well - Semaglutide, 1 MG/DOSE, (OZEMPIC, 1 MG/DOSE,) 4 MG/3ML SOPN; Inject 1 mg into the skin once a week.  Dispense: 9 mL; Refill: 1  3. Mixed hyperlipidemia  Chronic, controlled  Continue with current medications,  tolerating medications well  4. Multiple myeloma in remission [facility])  Continue follow up with Oncology  5. Nocturia  She has been awakening during the night with nocturia  Will send for sleep study for further evaluation - Ambulatory referral to Sleep Studies     Patient was given opportunity to ask questions. Patient verbalized understanding of the plan and was able to repeat key elements of the plan. All questions were answered to their satisfaction.  [name], FNP   I, [name], FNP, have reviewed all documentation for this visit. The documentation on 05/03/20 for the exam, diagnosis, procedures, and orders are all accurate and complete.   IF YOU HAVE BEEN REFERRED TO A SPECIALIST, IT MAY TAKE 1-2 WEEKS TO SCHEDULE/PROCESS THE REFERRAL. IF YOU HAVE NOT HEARD FROM US/SPECIALIST IN TWO WEEKS, PLEASE GIVE [place] A CALL AT [phone].   THE PATIENT IS ENCOURAGED TO PRACTICE SOCIAL DISTANCING DUE TO THE COVID-19 PANDEMIC.

## 2020-05-03 NOTE — Patient Instructions (Signed)

## 2020-05-05 ENCOUNTER — Ambulatory Visit (HOSPITAL_COMMUNITY): Payer: 59

## 2020-05-05 ENCOUNTER — Other Ambulatory Visit: Payer: Self-pay

## 2020-05-05 ENCOUNTER — Ambulatory Visit (HOSPITAL_COMMUNITY)
Admission: RE | Admit: 2020-05-05 | Discharge: 2020-05-05 | Disposition: A | Discharge status: Home / Self Care | Payer: 59 | Source: Ambulatory Visit | Attending: Neurosurgery | Admitting: Neurosurgery

## 2020-05-05 DIAGNOSIS — C9030 Solitary plasmacytoma not having achieved remission: Secondary | ICD-10-CM | POA: Insufficient documentation

## 2020-05-05 MED ORDER — GADOBUTROL 1 MMOL/ML IV SOLN
8.0000 mL | Freq: Once | INTRAVENOUS | Status: AC | PRN
  Administered 2020-05-05: 8 mL via INTRAVENOUS

## 2020-05-07 ENCOUNTER — Inpatient Hospital Stay: Payer: 59

## 2020-05-07 ENCOUNTER — Other Ambulatory Visit: Payer: Self-pay

## 2020-05-07 VITALS — BP 109/69 | HR 62 | Temp 98.6°F | Resp 16 | Wt 173.5 lb

## 2020-05-07 DIAGNOSIS — C9000 Multiple myeloma not having achieved remission: Secondary | ICD-10-CM

## 2020-05-07 DIAGNOSIS — Z7189 Other specified counseling: Secondary | ICD-10-CM

## 2020-05-07 DIAGNOSIS — C7951 Secondary malignant neoplasm of bone: Secondary | ICD-10-CM

## 2020-05-07 DIAGNOSIS — Z5112 Encounter for antineoplastic immunotherapy: Secondary | ICD-10-CM | POA: Diagnosis not present

## 2020-05-07 LAB — CBC WITH DIFFERENTIAL/PLATELET
Abs Immature Granulocytes: 0.02 10*3/uL (ref 0.00–0.07)
Basophils Absolute: 0 10*3/uL (ref 0.0–0.1)
Basophils Relative: 0 %
Eosinophils Absolute: 0.3 10*3/uL (ref 0.0–0.5)
Eosinophils Relative: 6 %
HCT: 34.6 % — ABNORMAL LOW (ref 36.0–46.0)
Hemoglobin: 11.7 g/dL — ABNORMAL LOW (ref 12.0–15.0)
Immature Granulocytes: 0 %
Lymphocytes Relative: 22 %
Lymphs Abs: 1.1 10*3/uL (ref 0.7–4.0)
MCH: 31.6 pg (ref 26.0–34.0)
MCHC: 33.8 g/dL (ref 30.0–36.0)
MCV: 93.5 fL (ref 80.0–100.0)
Monocytes Absolute: 0.7 10*3/uL (ref 0.1–1.0)
Monocytes Relative: 14 %
Neutro Abs: 3 10*3/uL (ref 1.7–7.7)
Neutrophils Relative %: 58 %
Platelets: 171 10*3/uL (ref 150–400)
RBC: 3.7 MIL/uL — ABNORMAL LOW (ref 3.87–5.11)
RDW: 15 % (ref 11.5–15.5)
WBC: 5.2 10*3/uL (ref 4.0–10.5)
nRBC: 0 % (ref 0.0–0.2)

## 2020-05-07 LAB — CMP (CANCER CENTER ONLY)
ALT: 12 U/L (ref 0–44)
AST: 12 U/L — ABNORMAL LOW (ref 15–41)
Albumin: 3.8 g/dL (ref 3.5–5.0)
Alkaline Phosphatase: 48 U/L (ref 38–126)
Anion gap: 10 (ref 5–15)
BUN: 15 mg/dL (ref 6–20)
CO2: 28 mmol/L (ref 22–32)
Calcium: 9.3 mg/dL (ref 8.9–10.3)
Chloride: 103 mmol/L (ref 98–111)
Creatinine: 0.78 mg/dL (ref 0.44–1.00)
GFR, Estimated: >60 mL/min (ref >60)
Glucose, Bld: 98 mg/dL (ref 70–99)
Potassium: 3.5 mmol/L (ref 3.5–5.1)
Sodium: 141 mmol/L (ref 135–145)
Total Bilirubin: 0.9 mg/dL (ref 0.3–1.2)
Total Protein: 5.9 g/dL — ABNORMAL LOW (ref 6.5–8.1)

## 2020-05-07 MED ORDER — SODIUM CHLORIDE 0.9 % IV SOLN
Freq: Once | INTRAVENOUS | Status: AC
  Filled 2020-05-07: qty 250

## 2020-05-07 MED ORDER — HEPARIN SOD (PORK) LOCK FLUSH 100 UNIT/ML IV SOLN
500.0000 [IU] | Freq: Once | INTRAVENOUS | Status: AC | PRN
  Administered 2020-05-07: 500 [IU]
  Filled 2020-05-07: qty 5

## 2020-05-07 MED ORDER — DEXTROSE 5 % IV SOLN
36.0000 mg/m2 | Freq: Once | INTRAVENOUS | Status: AC
  Administered 2020-05-07: 70 mg via INTRAVENOUS
  Filled 2020-05-07: qty 30

## 2020-05-07 MED ORDER — SODIUM CHLORIDE 0.9 % IV SOLN
20.0000 mg | Freq: Once | INTRAVENOUS | Status: AC
  Administered 2020-05-07: 20 mg via INTRAVENOUS
  Filled 2020-05-07: qty 20

## 2020-05-07 MED ORDER — SODIUM CHLORIDE 0.9% FLUSH
10.0000 mL | INTRAVENOUS | Status: DC | PRN
  Administered 2020-05-07: 10 mL
  Filled 2020-05-07: qty 10

## 2020-05-07 NOTE — Patient Instructions (Signed)
[facility] Discharge Instructions for Patients Receiving Chemotherapy  Today you received the following chemotherapy agent: Carfilzomib (Kyprolis)  To help prevent nausea and vomiting after your treatment, we encourage you to take your nausea medication as directed by your MD.   If you develop nausea and vomiting that is not controlled by your nausea medication, call the clinic.   BELOW ARE SYMPTOMS THAT SHOULD BE REPORTED IMMEDIATELY:  *FEVER GREATER THAN 100.5 F  *CHILLS WITH OR WITHOUT FEVER  NAUSEA AND VOMITING THAT IS NOT CONTROLLED WITH YOUR NAUSEA MEDICATION  *UNUSUAL SHORTNESS OF BREATH  *UNUSUAL BRUISING OR BLEEDING  TENDERNESS IN MOUTH AND THROAT WITH OR WITHOUT PRESENCE OF ULCERS  *URINARY PROBLEMS  *BOWEL PROBLEMS  UNUSUAL RASH Items with * indicate a potential emergency and should be followed up as soon as possible.  Feel free to call the clinic should you have any questions or concerns. The clinic phone number is [phone].  Please show the CHEMO ALERT CARD at check-in to the Emergency Department and triage nurse.   

## 2020-05-08 ENCOUNTER — Inpatient Hospital Stay: Payer: 59

## 2020-05-08 VITALS — BP 144/88 | HR 58 | Temp 98.3°F | Resp 16 | Wt 175.0 lb

## 2020-05-08 DIAGNOSIS — Z5112 Encounter for antineoplastic immunotherapy: Secondary | ICD-10-CM | POA: Diagnosis not present

## 2020-05-08 DIAGNOSIS — C7951 Secondary malignant neoplasm of bone: Secondary | ICD-10-CM

## 2020-05-08 DIAGNOSIS — Z7189 Other specified counseling: Secondary | ICD-10-CM

## 2020-05-08 DIAGNOSIS — C9000 Multiple myeloma not having achieved remission: Secondary | ICD-10-CM

## 2020-05-08 MED ORDER — PROCHLORPERAZINE MALEATE 10 MG PO TABS
10.0000 mg | ORAL_TABLET | Freq: Once | ORAL | Status: AC
  Administered 2020-05-08: 10 mg via ORAL

## 2020-05-08 MED ORDER — DEXTROSE 5 % IV SOLN
36.0000 mg/m2 | Freq: Once | INTRAVENOUS | Status: AC
  Administered 2020-05-08: 70 mg via INTRAVENOUS
  Filled 2020-05-08: qty 30

## 2020-05-08 MED ORDER — SODIUM CHLORIDE 0.9 % IV SOLN
Freq: Once | INTRAVENOUS | Status: DC
  Filled 2020-05-08: qty 250

## 2020-05-08 MED ORDER — SODIUM CHLORIDE 0.9% FLUSH
10.0000 mL | INTRAVENOUS | Status: DC | PRN
  Administered 2020-05-08: 10 mL
  Filled 2020-05-08: qty 10

## 2020-05-08 MED ORDER — PROCHLORPERAZINE MALEATE 10 MG PO TABS
ORAL_TABLET | ORAL | Status: AC
  Filled 2020-05-08: qty 1

## 2020-05-08 MED ORDER — SODIUM CHLORIDE 0.9 % IV SOLN
Freq: Once | INTRAVENOUS | Status: AC
  Filled 2020-05-08: qty 250

## 2020-05-08 MED ORDER — HEPARIN SOD (PORK) LOCK FLUSH 100 UNIT/ML IV SOLN
500.0000 [IU] | Freq: Once | INTRAVENOUS | Status: AC | PRN
  Administered 2020-05-08: 500 [IU]
  Filled 2020-05-08: qty 5

## 2020-05-08 NOTE — Patient Instructions (Signed)
[facility] Discharge Instructions for Patients Receiving Chemotherapy  Today you received the following chemotherapy agents:  Kyprolis  To help prevent nausea and vomiting after your treatment, we encourage you to take your nausea medication as directed.   If you develop nausea and vomiting that is not controlled by your nausea medication, call the clinic.   BELOW ARE SYMPTOMS THAT SHOULD BE REPORTED IMMEDIATELY:  *FEVER GREATER THAN 100.5 F  *CHILLS WITH OR WITHOUT FEVER  NAUSEA AND VOMITING THAT IS NOT CONTROLLED WITH YOUR NAUSEA MEDICATION  *UNUSUAL SHORTNESS OF BREATH  *UNUSUAL BRUISING OR BLEEDING  TENDERNESS IN MOUTH AND THROAT WITH OR WITHOUT PRESENCE OF ULCERS  *URINARY PROBLEMS  *BOWEL PROBLEMS  UNUSUAL RASH Items with * indicate a potential emergency and should be followed up as soon as possible.  Feel free to call the clinic should you have any questions or concerns. The clinic phone number is [phone].  Please show the CHEMO ALERT CARD at check-in to the Emergency Department and triage nurse.   

## 2020-05-09 ENCOUNTER — Telehealth: Payer: Self-pay | Admitting: *Deleted

## 2020-05-09 NOTE — Telephone Encounter (Signed)
Per [facility] receptionist, patient "requested this nurse call or e-mail her.  Has questions of reported return to work (RTW) information on previous forms". Personally reviewed form faxed to [place] 03/08/2020 with RTW "to be determined" with reason of continued chemotherapy.   Patient also had possible transplant candidate questions for provider to discuss with next follow up. Connected with Elwin Sleight.  Provided above RTW info.     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  Payments stopped January 23, 2020.  [employer] has 01/23/2020 as my expected return to work date per [employer]  provider report which may be date I reported with job offer I accepted of company buying out my employer that was reneged.  Employer said they were unable to accommodate known limitations/restrictions with job offered.  Currently approved for long-term disability.  Need form completed same as [employer] form to authorize [employer] to continue." "Saw Dr. Alvie Heidelberg at [facility] today.  Scheduled next follow up 06/11/2020 for transplant work up after Bone Marrow Biopsy at [facility] Monday 05/14/2020."  Awaiting delivery of [facility] form.

## 2020-05-10 ENCOUNTER — Ambulatory Visit
Admission: RE | Admit: 2020-05-10 | Discharge: 2020-05-10 | Disposition: A | Discharge status: Home / Self Care | Payer: 59 | Source: Ambulatory Visit | Attending: Nurse Practitioner | Admitting: Nurse Practitioner

## 2020-05-10 ENCOUNTER — Other Ambulatory Visit: Payer: Self-pay

## 2020-05-10 DIAGNOSIS — Z1231 Encounter for screening mammogram for malignant neoplasm of breast: Secondary | ICD-10-CM

## 2020-05-11 ENCOUNTER — Other Ambulatory Visit: Payer: Self-pay | Admitting: Student

## 2020-05-11 ENCOUNTER — Ambulatory Visit (HOSPITAL_COMMUNITY)
Admission: RE | Admit: 2020-05-11 | Discharge: 2020-05-11 | Disposition: A | Discharge status: Home / Self Care | Payer: 59 | Source: Ambulatory Visit | Attending: Hematology | Admitting: Hematology

## 2020-05-11 DIAGNOSIS — Z5111 Encounter for antineoplastic chemotherapy: Secondary | ICD-10-CM | POA: Insufficient documentation

## 2020-05-11 DIAGNOSIS — C9000 Multiple myeloma not having achieved remission: Secondary | ICD-10-CM | POA: Diagnosis not present

## 2020-05-11 LAB — GLUCOSE, CAPILLARY: Glucose-Capillary: 84 mg/dL (ref 70–99)

## 2020-05-11 MED ORDER — FLUDEOXYGLUCOSE F - 18 (FDG) INJECTION
8.2000 | Freq: Once | INTRAVENOUS | Status: AC | PRN
  Administered 2020-05-11: 8.2 via INTRAVENOUS

## 2020-05-14 ENCOUNTER — Ambulatory Visit (HOSPITAL_COMMUNITY)
Admission: RE | Admit: 2020-05-14 | Discharge: 2020-05-14 | Disposition: A | Discharge status: Home / Self Care | Payer: 59 | Source: Ambulatory Visit | Attending: Hematology | Admitting: Hematology

## 2020-05-14 ENCOUNTER — Other Ambulatory Visit: Payer: Self-pay

## 2020-05-14 ENCOUNTER — Encounter (HOSPITAL_COMMUNITY): Payer: Self-pay

## 2020-05-14 DIAGNOSIS — D649 Anemia, unspecified: Secondary | ICD-10-CM | POA: Diagnosis not present

## 2020-05-14 DIAGNOSIS — Z5111 Encounter for antineoplastic chemotherapy: Secondary | ICD-10-CM

## 2020-05-14 DIAGNOSIS — C9000 Multiple myeloma not having achieved remission: Secondary | ICD-10-CM | POA: Insufficient documentation

## 2020-05-14 DIAGNOSIS — D72819 Decreased white blood cell count, unspecified: Secondary | ICD-10-CM | POA: Diagnosis not present

## 2020-05-14 LAB — CBC
HCT: 33.3 % — ABNORMAL LOW (ref 36.0–46.0)
Hemoglobin: 11.1 g/dL — ABNORMAL LOW (ref 12.0–15.0)
MCH: 31.5 pg (ref 26.0–34.0)
MCHC: 33.3 g/dL (ref 30.0–36.0)
MCV: 94.6 fL (ref 80.0–100.0)
Platelets: 169 10*3/uL (ref 150–400)
RBC: 3.52 MIL/uL — ABNORMAL LOW (ref 3.87–5.11)
RDW: 15.1 % (ref 11.5–15.5)
WBC: 3.3 10*3/uL — ABNORMAL LOW (ref 4.0–10.5)
nRBC: 0 % (ref 0.0–0.2)

## 2020-05-14 LAB — GLUCOSE, CAPILLARY: Glucose-Capillary: 77 mg/dL (ref 70–99)

## 2020-05-14 MED ORDER — MIDAZOLAM HCL 2 MG/2ML IJ SOLN
INTRAMUSCULAR | Status: AC | PRN
  Administered 2020-05-14 (×2): 1 mg via INTRAVENOUS
  Administered 2020-05-14: 2 mg via INTRAVENOUS

## 2020-05-14 MED ORDER — FENTANYL CITRATE (PF) 100 MCG/2ML IJ SOLN
INTRAMUSCULAR | Status: AC | PRN
  Administered 2020-05-14 (×2): 50 ug via INTRAVENOUS

## 2020-05-14 MED ORDER — MIDAZOLAM HCL 2 MG/2ML IJ SOLN
INTRAMUSCULAR | Status: AC
  Filled 2020-05-14: qty 4

## 2020-05-14 MED ORDER — FENTANYL CITRATE (PF) 100 MCG/2ML IJ SOLN
INTRAMUSCULAR | Status: AC
  Filled 2020-05-14: qty 2

## 2020-05-14 MED ORDER — SODIUM CHLORIDE 0.9 % IV SOLN: INTRAVENOUS | Status: DC

## 2020-05-14 MED ORDER — HEPARIN SOD (PORK) LOCK FLUSH 100 UNIT/ML IV SOLN
500.0000 [IU] | Freq: Once | INTRAVENOUS | Status: AC
  Administered 2020-05-14: 500 [IU] via INTRAVENOUS
  Filled 2020-05-14: qty 5

## 2020-05-14 MED ORDER — LIDOCAINE HCL (PF) 1 % IJ SOLN
INTRAMUSCULAR | Status: AC | PRN
  Administered 2020-05-14: 10 mL

## 2020-05-14 NOTE — H&P (Signed)
Referring Physician(s): Brunetta Genera  Supervising Physician: Daryll Brod  Patient Status:  [facility] OP  Chief Complaint: "I'm here for a bone marrow biopsy"   Subjective: Patient familiar to IR service from bone marrow biopsy and Port-A-Cath placement in 2021.  She has a history of multiple myeloma/cranial plasma cell neoplasm and presents again today for CT-guided bone marrow biopsy to evaluate treatment response prior to bone marrow transplant.  She currently denies fever, headache, chest pain, dyspnea, cough, abdominal pain, nausea, vomiting or bleeding.  She does have some intermittent back pain.  Past Medical History:  Diagnosis Date  . Anxiety   . Hypertension   . Pre-diabetes    Past Surgical History:  Procedure Laterality Date  . ABLATION    . APPLICATION OF CRANIAL NAVIGATION N/A 10/10/2019   Procedure: APPLICATION OF CRANIAL NAVIGATION;  Surgeon: Consuella Lose, MD;  Location: [facility];  Service: Neurosurgery;  Laterality: N/A;  APPLICATION OF CRANIAL NAVIGATION  . CRANIOPLASTY Left 10/14/2019   Procedure: LEFT CRANIOPLASTY WITH PLACEMENT OF CUSTOM CRANIAL IMPLANT;  Surgeon: Consuella Lose, MD;  Location: [facility];  Service: Neurosurgery;  Laterality: Left;  . CRANIOTOMY Left 10/10/2019   Procedure: Left frontal Stereotactic craniectomy for resection of tumor;  Surgeon: Consuella Lose, MD;  Location: [facility];  Service: Neurosurgery;  Laterality: Left;  Left frontal Stereotactic craniectomy for resection of tumor  . HAMMER TOE SURGERY Left   . IR IMAGING GUIDED PORT INSERTION  12/01/2019  . TUBAL LIGATION        Allergies: Patient has no known allergies.  Medications: Prior to Admission medications   Medication Sig Start Date End Date Taking? Authorizing Provider  acetaminophen (TYLENOL) 650 MG CR tablet Take 1,300 mg by mouth every 8 (eight) hours as needed for pain.   Yes [provider]  aspirin EC 81 MG tablet Take 1 tablet (81 mg total) by  mouth daily. 11/28/19  Yes Brunetta Genera, MD  atorvastatin (LIPITOR) 10 MG tablet TAKE 1 TABLET BY MOUTH EVERY DAY 01/23/20  Yes [name], FNP  benazepril-hydrochlorthiazide (LOTENSIN HCT) 20-12.5 MG tablet Take 1 tablet by mouth daily. 05/03/20  Yes [name], FNP  Cholecalciferol (VITAMIN D) 50 MCG (2000 UT) tablet Take 2,000 Units by mouth daily.   Yes [provider]  Coenzyme Q10 (COQ-10) 100 MG CAPS Take 200 mg by mouth daily.   Yes [provider]  dexamethasone (DECADRON) 4 MG tablet TAKE 10 TABLETS BY MOUTH ONCE ON DAY 22. REPEAT EVERY 28 DAYS FOR THE FIRST 4 CYCLES. TAKE WITH BREAKFAST 11/28/19 11/27/20 Yes Brunetta Genera, MD  lenalidomide (REVLIMID) 25 MG capsule TAKE 1 CAPSULE BY MOUTH EVERY DAY FOR 21 DAYS ON, THEN 7 DAYS OFF 04/25/20  Yes Brunetta Genera, MD  levocetirizine (XYZAL) 5 MG tablet TAKE 1 TABLET BY MOUTH EVERY DAY IN THE EVENING 04/02/20  Yes [name], FNP  lidocaine-prilocaine (EMLA) cream APPLY TO AFFECTED AREA ONCE AS DIRECTED Patient taking differently: Apply 1 application topically daily as needed (port access). 11/28/19 11/27/20 Yes Brunetta Genera, MD  LORazepam (ATIVAN) 0.5 MG tablet TAKE 1 TABLET BY MOUTH EVERY 6 HOURS AS NEEDED (NAUSEA OR VOMITING). 02/16/20 08/14/20 Yes Brunetta Genera, MD  Multiple Vitamins-Minerals (IMMUNE SUPPORT PO) Take 2 tablets by mouth daily.   Yes [provider]  ondansetron (ZOFRAN) 8 MG tablet TAKE 1 TABLET BY MOUTH TWICE DAILY AS NEEDED FOR NAUSEA AND/OR VOMITING 11/28/19 11/27/20 Yes Brunetta Genera, MD  prochlorperazine (COMPAZINE)  10 MG tablet TAKE 1 TABLET BY MOUTH EVERY 6 HOURS AS NEEDED (NAUSEA OR VOMITING). 11/28/19 11/27/20 Yes Brunetta Genera, MD  Semaglutide, 1 MG/DOSE, (OZEMPIC, 1 MG/DOSE,) 4 MG/3ML SOPN Inject 1 mg into the skin once a week. 05/03/20  Yes [name], FNP  senna (SENOKOT) 8.6 MG TABS tablet Take 17.2 mg by mouth daily as needed for mild  constipation.   Yes [provider]  traZODone (DESYREL) 50 MG tablet TAKE 1-2 TABLETS BY MOUTH AT BEDTIME AS NEEDED FOR SLEEP. 03/21/20  Yes Brunetta Genera, MD  Turmeric Curcumin 500 MG CAPS Take 500 mg by mouth daily.   Yes [provider]  valACYclovir (VALTREX) 1000 MG tablet TAKE 1 TABLET BY MOUTH EVERY DAY AS NEEDED 03/26/20  Yes [name], FNP  vitamin B-12 (CYANOCOBALAMIN) 1000 MCG tablet Take 1,000 mcg by mouth daily.   Yes [provider]  Insulin Pen Needle (NOVOFINE PLUS) 32G X 4 MM MISC Inject 1 pen into the skin daily. Inject 1 pen with Victoza SQ 10/23/17   [name], FNP  Oxymetazoline HCl (AFRIN NODRIP SEVERE CONGEST NA) Place 1 spray into the nose daily as needed (congestion).    [provider]     Vital Signs: BP 117/78   Pulse 64   Temp 98.7 F (37.1 C) (Oral)   Resp 16   Ht 5' 6.4" (1.687 m)   Wt 175 lb 0.7 oz (79.4 kg)   LMP 07/09/2015   SpO2 100%   BMI 27.91 kg/m   Physical Exam awake, alert.  Chest clear to auscultation bilaterally.  Clean, intact right chest wall Port-A-Cath.  Heart with regular rate and rhythm.  Abdomen soft, positive bowel sounds, nontender.  No lower extremity edema.  Imaging: NM PET Image Restage (PS) Whole Body  Result Date: 05/11/2020 CLINICAL DATA:  Subsequent treatment strategy for multiple myeloma. EXAM: NUCLEAR MEDICINE PET WHOLE BODY TECHNIQUE: 8.7 mCi F-18 FDG was injected intravenously. Full-ring PET imaging was performed from the head to foot after the radiotracer. CT data was obtained and used for attenuation correction and anatomic localization. Fasting blood glucose: 84 mg/dl COMPARISON:  11/10/19. FINDINGS: Mediastinal blood pool activity: SUV max 2.27 HEAD/NECK: No hypermetabolic activity in the scalp. No hypermetabolic cervical lymph nodes. Incidental CT findings: Previous left frontal bone craniectomy CHEST: No hypermetabolic mediastinal or hilar nodes. No suspicious pulmonary  nodules on the CT scan. Incidental CT findings: none ABDOMEN/PELVIS: No abnormal hypermetabolic activity within the liver, pancreas, adrenal glands, or spleen. No hypermetabolic lymph nodes in the abdomen or pelvis. Incidental CT findings: Small fat containing left inguinal hernia. SKELETON: No focal hypermetabolic activity to suggest skeletal metastasis. Incidental CT findings: none EXTREMITIES: No abnormal hypermetabolic activity in the lower extremities. Incidental CT findings: none IMPRESSION: 1. No findings to suggest FDG avid lesions of multiple myeloma. 2. Status post left frontal craniectomy without signs of surrounding abnormal FDG uptake. Electronically Signed   By: Kerby Moors M.D.   On: 05/11/2020 12:52   MM 3D SCREEN BREAST BILATERAL  Result Date: 05/11/2020 CLINICAL DATA:  Screening. EXAM: DIGITAL SCREENING BILATERAL MAMMOGRAM WITH TOMOSYNTHESIS AND CAD TECHNIQUE: Bilateral screening digital craniocaudal and mediolateral oblique mammograms were obtained. Bilateral screening digital breast tomosynthesis was performed. The images were evaluated with computer-aided detection. COMPARISON:  Previous exam(s). ACR Breast Density Category b: There are scattered areas of fibroglandular density. FINDINGS: There are no findings suspicious for malignancy. The images were evaluated with computer-aided detection. IMPRESSION: No mammographic evidence of malignancy. A  result letter of this screening mammogram will be mailed directly to the patient. RECOMMENDATION: Screening mammogram in one year. (Code:SM-B-01Y) BI-RADS CATEGORY  1: Negative. Electronically Signed   By: Evangeline Dakin M.D.   On: 05/11/2020 13:16    Labs:  CBC: Recent Labs    04/23/20 0916 04/30/20 0855 05/07/20 0840 05/14/20 0755  WBC 3.9* 4.4 5.2 3.3*  HGB 11.8* 11.5* 11.7* 11.1*  HCT 35.1* 34.3* 34.6* 33.3*  PLT 285 165 171 169    COAGS: Recent Labs    11/08/19 0730  INR 1.0    BMP: Recent Labs    06/01/19 0910  06/01/19 0910 10/06/19 1544 10/10/19 0907 10/11/19 0500 10/31/19 1248 04/09/20 0911 04/23/20 0916 04/30/20 0855 05/07/20 0840  NA 144  --  144   < > 141   < > 140 142 143 141  K 4.4  --  4.0   < > 4.2   < > 4.0 3.8 4.0 3.5  CL 104  --  106  --  109   < > 107 107 104 103  CO2 25  --  26  --  23   < > _0 GLUCOSE 92   < > 78  --  104*   < > 80 87 100* 98  BUN 10   < > 21*  --  10   < > _1 CALCIUM 9.4  --  9.5  --  8.4*   < > 8.8* 8.4* 9.7 9.3  CREATININE 0.77  --  0.80  --  0.82   < > 0.76 0.78 0.81 0.78  GFRNONAA 87  --  >60  --  >60   < > >60 >60 >60 >60  GFRAA 101  --  >60  --  >60  --   --   --   --   --    < > = values in this interval not displayed.    LIVER FUNCTION TESTS: Recent Labs    04/09/20 0911 04/23/20 0916 04/30/20 0855 05/07/20 0840  BILITOT 0.9 1.0 0.9 0.9  AST _2 12*  ALT _3 ALKPHOS 47 49 51 48  PROT 6.0* 6.2* 6.1* 5.9*  ALBUMIN 3.6 3.9 3.7 3.8    Assessment and Plan: Patient familiar to IR service from bone marrow biopsy and Port-A-Cath placement in 2021.  She has a history of multiple myeloma/cranial plasma cell neoplasm and presents again today for CT-guided bone marrow biopsy to evaluate treatment response prior to bone marrow transplant. Risks and benefits of procedure was discussed with the patient  including, but not limited to bleeding, infection, damage to adjacent structures or low yield requiring additional tests.  All of the questions were answered and there is agreement to proceed.  Consent signed and in chart.     Electronically Signed: D. Rowe Robert, PA-C 05/14/2020, 8:36 AM   I spent a total of 15 minutes at the the patient's bedside AND on the patient's hospital floor or unit, greater than 50% of which was counseling/coordinating care for CT-guided bone marrow biopsy

## 2020-05-14 NOTE — Procedures (Signed)
Interventional Radiology Procedure Note  Procedure: CT BM asp and core    Complications: None  Estimated Blood Loss:  min  Findings: 11 g core and asp    M. Daryll Brod, MD

## 2020-05-14 NOTE — Discharge Instructions (Signed)
Interventional radiology phone numbers [phone] After hours [phone]  Bone Marrow Aspiration and Bone Marrow Biopsy, Adult, Care After This sheet gives you information about how to care for yourself after your procedure. Your health care provider may also give you more specific instructions. If you have problems or questions, contact your health care provider. What can I expect after the procedure? After the procedure, it is common to have:  Mild pain and tenderness.  Swelling.  Bruising. Follow these instructions at home: Puncture site care 1. Follow instructions from your health care provider about how to take care of the puncture site. Make sure you: ? Wash your hands with soap and water before and after you change your bandage (dressing). If soap and water are not available, use hand sanitizer.  2. Check your puncture site every day for signs of infection. Check for: ? More redness, swelling, or pain. ? Fluid or blood. ? Warmth. ? Pus or a bad smell.   Activity 1. Return to your normal activities as told by your health care provider. Ask your health care provider what activities are safe for you. 2. Do not lift anything that is heavier than 10 lb (4.5 kg), or the limit that you are told, until your health care provider says that it is safe. 3. Do not drive for 24 hours if you were given a sedative during your procedure. General instructions 1. Take over-the-counter and prescription medicines only as told by your health care provider. 2. Do not take baths, swim, or use a hot tub until your health care provider approves. You may remove your dressing 04/21/20 and shower 24 hours after your biopsy. 3. Put ice on the affected area. To do this: ? Put ice in a plastic bag. ? Place a towel between your skin and the bag. ? Leave the ice on for 20 minutes, 2-3 times a day. 4. Keep all follow-up visits as told by your health care provider. This is important.   Contact a health care  provider if:  Your pain is not controlled with medicine.  You have a fever.  You have more redness, swelling, or pain around the puncture site.  You have fluid or blood coming from the puncture site.  Your puncture site feels warm to the touch.  You have pus or a bad smell coming from the puncture site. Summary  After the procedure, it is common to have mild pain, tenderness, swelling, and bruising.  Follow instructions from your health care provider about how to take care of the puncture site and what activities are safe for you.  Take over-the-counter and prescription medicines only as told by your health care provider.  Contact a health care provider if you have any signs of infection, such as fluid or blood coming from the puncture site. This information is not intended to replace advice given to you by your health care provider. Make sure you discuss any questions you have with your health care provider. Document Revised: 05/25/2018 Document Reviewed: 05/25/2018 [employer] Patient Education  2021 [employer]    Moderate Conscious Sedation, Adult, Care After This sheet gives you information about how to care for yourself after your procedure. Your health care provider may also give you more specific instructions. If you have problems or questions, contact your health care provider. What can I expect after the procedure? After the procedure, it is common to have:  Sleepiness for several hours.  Impaired judgment for several hours.  Difficulty with balance.    Vomiting if you eat too soon. Follow these instructions at home: For the time period you were told by your health care provider: 3. Rest. 4. Do not participate in activities where you could fall or become injured. 5. Do not drive or use machinery. 6. Do not drink alcohol. 7. Do not take sleeping pills or medicines that cause drowsiness. 8. Do not make important decisions or sign legal documents. 9. Do not take  care of children on your own.      Eating and drinking 4. Follow the diet recommended by your health care provider. 5. Drink enough fluid to keep your urine pale yellow. 6. If you vomit: ? Drink water, juice, or soup when you can drink without vomiting. ? Make sure you have little or no nausea before eating solid foods.   General instructions 5. Take over-the-counter and prescription medicines only as told by your health care provider. 6. Have a responsible adult stay with you for the time you are told. It is important to have someone help care for you until you are awake and alert. 7. Do not smoke. 8. Keep all follow-up visits as told by your health care provider. This is important. Contact a health care provider if:  You are still sleepy or having trouble with balance after 24 hours.  You feel light-headed.  You keep feeling nauseous or you keep vomiting.  You develop a rash.  You have a fever.  You have redness or swelling around the IV site. Get help right away if:  You have trouble breathing.  You have new-onset confusion at home. Summary  After the procedure, it is common to feel sleepy, have impaired judgment, or feel nauseous if you eat too soon.  Rest after you get home. Know the things you should not do after the procedure.  Follow the diet recommended by your health care provider and drink enough fluid to keep your urine pale yellow.  Get help right away if you have trouble breathing or new-onset confusion at home. This information is not intended to replace advice given to you by your health care provider. Make sure you discuss any questions you have with your health care provider. Document Revised: 05/06/2019 Document Reviewed: 12/02/2018 [employer] Patient Education  2021 [employer] 

## 2020-05-19 NOTE — Progress Notes (Signed)
HEMATOLOGY/ONCOLOGY CLNIC NOTE  Date of Service: 05/21/2020  Patient Care Team: [name], FNP as PCP - General (General Practice)  CHIEF COMPLAINTS/PURPOSE OF CONSULTATION:  Multiple myeloma- continued mx  HISTORY OF PRESENTING ILLNESS:   Alexandra [name] is a wonderful 55 y.o. female who has been referred to [place] by Dr. 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 The pt reports that she is doing well overall.   The pt reports that she was taking Benazepril-HCTZ for her HTN but was told to discontinue as she was becoming hypotensive while in the hospital. She continues taking Lipitor for her HLD and was previously on Ozempic for her Type 2 Diabetes. She has no significant family history of cancers or blood disorders. Pt has no known medication allergies. Pt had a hammer toe surgery and a tubal ligation. 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   In mid-June she began to notice a growth on the left side of her forehead. Because the lump continued to grow she visited her PCP at the end of June. Her PCP and Dermatologist thought that it was a lipoma initally. She was sent for a consult with Dr. Harlow Mares at [facility] in August. She received her first cranial MRI on 10/05/2019, which revealed a mass and bone erosion. Pt had her initial Craniectomy on 10/11/2019 and a Left Bifrontal Cranioplasty on 10/14/2019. Pt denies any new bone pain, fevers, chills, or night sweats at the time. Pt has lost 40 lbs over the last year, but this was after the addition of Ozempic and improved dietary and exercise habits. She has started regaining weight after holding Ozempic. At this time pt reports mild discomfort at surgical site and no other pertinent symptoms.  Of note prior to the patient's visit today, pt has had Surgical Pathology [id number]) completed on 10/10/2019 with results revealing "A.  BRAIN TUMOR, LEFT FRONTAL, RESECTION: - Plasma cell neoplasm B. BRAIN TUMOR, LEFT FRONTAL, RESECTION: - Plasma cell neoplasm C. BONE, LEFT FRONTAL, RESECTION: - Plasma cell neoplasm."   Pt has had MRI Brain ([id number]) completed on 10/11/2019 with results revealing "No complicating feature or residual tumor seen after left frontal bone mass resection."  Pt has had MRI Brain ([id number]) completed on 10/05/2019 with results revealing "1. Large extra-axial mass in the anterior left frontal region with bone erosion and extension into the scalp soft tissues. Considerations include hemangiopericytoma, metastasis, and aggressive meningioma. 2. 6 mm enhancing lesion in the clivus. This is indeterminate but does raise concern for metastatic disease. 3. Mild chronic small vessel ischemic disease."  Most recent lab results (10/11/2019) of CBC & BMP is as follows: all values are WNL except for WBC at 16.6K, RBC at 2.99, Hgb at 9.1, HCT at 26.5, Glucose at 104, Calcium at 8.4.  On review of systems, pt reports discomfort at surgicalsite and denies balance issues, memory loss, headaches, fevers, chills, unexpected weight loss and any othe symptoms.   On PMHx the pt reports HTN, HLD, Diabetes, Tubal Ligation, Hammer Toe Surgery, Herpes. On Social Hx the pt reports that she previously smoked on and off but is not currently smoking and does not drink much alcohol. On Family Hx the pt reports a grandfather with prostate cancer.  INTERVAL HISTORY:  Alexandra [name] is a wonderful 55 y.o. 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 The pt reports that she is  doing well overall. The pt last visit with [place] was on 04/23/2020. She is here for C7D1 of KRD and next dose of Zometa.  The pt reports that she was seen at [facility] by Dr. Alvie Heidelberg and noted it was okay to start transplant preparation at this time. The pt will continue current therapy until two weeks prior to collection.    The pt reports that she has been experiencing some cramping in her hands. The pt notes that she has been drinking the same amount of fluids and that this is not a constant, but noticeable. The pt notes no other concerns and has been eating well. The pt notes that her transplant is currently scheduled for June 20 and that she has another workup visit in May prior to the many visits in June for COVID tests and the start to the transplant. The pt notes that she has heard of a cap that freezes your hair so that you do not lose it, but was unsure if she would be able to so this or not.  The pt noted some concerns over continuing maintenance Revlimid long-term. She was unsure if she would want to switch this to something else when the time comes. Will discuss this later post-transplant visits.  Of note since the patient's last visit, pt has had PET Whole Body ([id number]) on 05/11/2020, which revealed "1. No findings to suggest FDG avid lesions of multiple myeloma. 2. Status post left frontal craniectomy without signs of surrounding abnormal FDG uptake."  The pt has also had CT Bone Marrow Biopsy & Aspiration ([id number]) on 05/14/2020, which revealed "-Normocellular bone marrow for age with trilineage hematopoiesis and 1%  plasma cells  -See comment   PERIPHERAL BLOOD:  -Normocytic-normochromic anemia  -Leukopenia   COMMENT:   The bone marrow is generally normocellular for age with trilineage  hematopoiesis but with predominance of erythroid precursors. The  myeloid changes are not considered specific and likely related to  therapy. In this background, the plasma cells represent 1% of all cells  associated with scattered small clusters and display slight lambda light  chain excess. The findings are limited but most suggestive of minimal  residual plasma cell neoplasm. Correlation with cytogenetic and FISH  studies is recommended. "  Lab results today 05/21/2020 of CBC w/diff and CMP is  as follows: all values are WNL except for WBC of 3.3K, RBC of 3.65, Hgb of 11.7, HCT of 34.0, Neutro Abs of 1.6K. CMP pending.  On review of systems, pt reports hand cramping and denies abdominal pain, leg swelling, and any other symptoms.  MEDICAL HISTORY:  Past Medical History:  Diagnosis Date  . Anxiety   . Hypertension   . Pre-diabetes     SURGICAL HISTORY: Past Surgical History:  Procedure Laterality Date  . ABLATION    . APPLICATION OF CRANIAL NAVIGATION N/A 10/10/2019   Procedure: APPLICATION OF CRANIAL NAVIGATION;  Surgeon: Consuella Lose, MD;  Location: [place];  Service: Neurosurgery;  Laterality: N/A;  APPLICATION OF CRANIAL NAVIGATION  . CRANIOPLASTY Left 10/14/2019   Procedure: LEFT CRANIOPLASTY WITH PLACEMENT OF CUSTOM CRANIAL IMPLANT;  Surgeon: Consuella Lose, MD;  Location: [place];  Service: Neurosurgery;  Laterality: Left;  . CRANIOTOMY Left 10/10/2019   Procedure: Left frontal Stereotactic craniectomy for resection of tumor;  Surgeon: Consuella Lose, MD;  Location: [place];  Service: Neurosurgery;  Laterality: Left;  Left frontal Stereotactic craniectomy for resection of tumor  . HAMMER TOE SURGERY Left   . IR IMAGING GUIDED  PORT INSERTION  12/01/2019  . TUBAL LIGATION      SOCIAL HISTORY: Social History   Socioeconomic History  . Marital status: Single    Spouse name: Not on file  . Number of children: Not on file  . Years of education: Not on file  . Highest education level: Not on file  Occupational History  . Not on file  Tobacco Use  . Smoking status: Never Smoker  . Smokeless tobacco: Never Used  Vaping Use  . Vaping Use: Never used  Substance and Sexual Activity  . Alcohol use: Yes    Comment: occassionally  . Drug use: Not Currently  . Sexual activity: Not on file  Other Topics Concern  . Not on file  Social History Narrative  . Not on file   Social Determinants of Health   Financial Resource Strain: Not on file  Food Insecurity:  Not on file  Transportation Needs: Not on file  Physical Activity: Not on file  Stress: Not on file  Social Connections: Not on file  Intimate Partner Violence: Not on file    FAMILY HISTORY: Family History  Problem Relation Age of Onset  . Breast cancer Maternal Grandmother   . Heart disease Mother   . Hypertension Mother   . Sarcoidosis Father   . Diabetes Maternal Grandfather     ALLERGIES:  has No Known Allergies.  MEDICATIONS:  Current Outpatient Medications  Medication Sig Dispense Refill  . acetaminophen (TYLENOL) 650 MG CR tablet Take 1,300 mg by mouth every 8 (eight) hours as needed for pain.    Marland Kitchen aspirin EC 81 MG tablet Take 1 tablet (81 mg total) by mouth daily. 100 tablet 3  . atorvastatin (LIPITOR) 10 MG tablet TAKE 1 TABLET BY MOUTH EVERY DAY 90 tablet 1  . benazepril-hydrochlorthiazide (LOTENSIN HCT) 20-12.5 MG tablet Take 1 tablet by mouth daily. 90 tablet 1  . Cholecalciferol (VITAMIN D) 50 MCG (2000 UT) tablet Take 6,000 Units by mouth daily.    . Coenzyme Q10 (COQ-10) 100 MG CAPS Take 200 mg by mouth daily.    Marland Kitchen dexamethasone (DECADRON) 4 MG tablet TAKE 10 TABLETS BY MOUTH ONCE ON DAY 22. REPEAT EVERY 28 DAYS FOR THE FIRST 4 CYCLES. TAKE WITH BREAKFAST 40 tablet 4  . lenalidomide (REVLIMID) 25 MG capsule TAKE 1 CAPSULE BY MOUTH EVERY DAY FOR 21 DAYS ON, THEN 7 DAYS OFF 21 capsule 0  . levocetirizine (XYZAL) 5 MG tablet TAKE 1 TABLET BY MOUTH EVERY DAY IN THE EVENING 90 tablet 3  . lidocaine-prilocaine (EMLA) cream APPLY TO AFFECTED AREA ONCE AS DIRECTED (Patient taking differently: Apply 1 application topically daily as needed (port access).) 30 g 3  . LORazepam (ATIVAN) 0.5 MG tablet TAKE 1 TABLET BY MOUTH EVERY 6 HOURS AS NEEDED (NAUSEA OR VOMITING). 30 tablet 0  . Multiple Vitamins-Minerals (IMMUNE SUPPORT PO) Take 2 tablets by mouth daily.    . ondansetron (ZOFRAN) 8 MG tablet TAKE 1 TABLET BY MOUTH TWICE DAILY AS NEEDED FOR NAUSEA AND/OR VOMITING 30 tablet 1   . Oxymetazoline HCl (AFRIN NODRIP SEVERE CONGEST NA) Place 1 spray into the nose daily as needed (congestion).    . prochlorperazine (COMPAZINE) 10 MG tablet TAKE 1 TABLET BY MOUTH EVERY 6 HOURS AS NEEDED (NAUSEA OR VOMITING). 30 tablet 1  . Semaglutide, 1 MG/DOSE, (OZEMPIC, 1 MG/DOSE,) 4 MG/3ML SOPN Inject 1 mg into the skin once a week. 9 mL 1  . senna (SENOKOT) 8.6 MG TABS tablet Take  17.2 mg by mouth daily as needed for mild constipation.    . traZODone (DESYREL) 50 MG tablet TAKE 1-2 TABLETS BY MOUTH AT BEDTIME AS NEEDED FOR SLEEP. 180 tablet 1  . Turmeric Curcumin 500 MG CAPS Take 500 mg by mouth daily.    . valACYclovir (VALTREX) 1000 MG tablet TAKE 1 TABLET BY MOUTH EVERY DAY AS NEEDED 90 tablet 1  . vitamin B-12 (CYANOCOBALAMIN) 1000 MCG tablet Take 1,000 mcg by mouth daily.    . Insulin Pen Needle (NOVOFINE PLUS) 32G X 4 MM MISC Inject 1 pen into the skin daily. Inject 1 pen with Victoza SQ (Patient not taking: Reported on 05/21/2020) 100 each 3   No current facility-administered medications for this visit.    REVIEW OF SYSTEMS:   10 Point review of Systems was done is negative except as noted above.  PHYSICAL EXAMINATION: ECOG PERFORMANCE STATUS: 1 - Symptomatic but completely ambulatory  . Vitals:   05/21/20 0916  BP: 100/69  Pulse: 65  Resp: 16  Temp: 97.7 F (36.5 C)  SpO2: 100%   Filed Weights   05/21/20 0916  Weight: 173 lb 6.4 oz (78.7 kg)   .Body mass index is 27.65 kg/m.    GENERAL:alert, in no acute distress and comfortable SKIN: no acute rashes, no significant lesions EYES: conjunctiva are pink and non-injected, sclera anicteric OROPHARYNX: MMM, no exudates, no oropharyngeal erythema or ulceration NECK: supple, no JVD LYMPH:  no palpable lymphadenopathy in the cervical, axillary or inguinal regions LUNGS: clear to auscultation b/l with normal respiratory effort HEART: regular rate & rhythm ABDOMEN:  normoactive bowel sounds , non tender, not  distended. Extremity: no pedal edema PSYCH: alert & oriented x 3 with fluent speech NEURO: no focal motor/sensory deficits  LABORATORY DATA:  I have reviewed the data as listed  CBC Latest Ref Rng & Units 05/21/2020 05/14/2020 05/07/2020  WBC 4.0 - 10.5 K/uL 3.3(L) 3.3(L) 5.2  Hemoglobin 12.0 - 15.0 g/dL 11.7(L) 11.1(L) 11.7(L)  Hematocrit 36.0 - 46.0 % 34.0(L) 33.3(L) 34.6(L)  Platelets 150 - 400 K/uL 284 169 171    CMP Latest Ref Rng & Units 05/07/2020 04/30/2020 04/23/2020  Glucose 70 - 99 mg/dL 98 100(H) 87  BUN 6 - 20 mg/dL 15 10 13   Creatinine 0.44 - 1.00 mg/dL 0.78 0.81 0.78  Sodium 135 - 145 mmol/L 141 143 142  Potassium 3.5 - 5.1 mmol/L 3.5 4.0 3.8  Chloride 98 - 111 mmol/L 103 104 107  CO2 22 - 32 mmol/L 28 28 26   Calcium 8.9 - 10.3 mg/dL 9.3 9.7 8.4(L)  Total Protein 6.5 - 8.1 g/dL 5.9(L) 6.1(L) 6.2(L)  Total Bilirubin 0.3 - 1.2 mg/dL 0.9 0.9 1.0  Alkaline Phos 38 - 126 U/L 48 51 49  AST 15 - 41 U/L 12(L) 19 17  ALT 0 - 44 U/L 12 17 16    11/08/2019 FISH Plasma Cell Myeloma Prognostic Panel:    11/08/2019 Cytogenetics:    11/08/2019 Bone Marrow Report [id number]):   10/10/2019 Surgical Pathology [id number]):   05/14/2020 Surgical Pathology "-Normocellular bone marrow for age with trilineage hematopoiesis and 1%  plasma cells  -See comment   PERIPHERAL BLOOD:  -Normocytic-normochromic anemia  -Leukopenia   COMMENT:   The bone marrow is generally normocellular for age with trilineage  hematopoiesis but with predominance of erythroid precursors. The  myeloid changes are not considered specific and likely related to  therapy. In this background, the plasma cells represent 1% of all cells  associated with scattered  small clusters and display slight lambda light  chain excess. The findings are limited but most suggestive of minimal  residual plasma cell neoplasm. Correlation with cytogenetic and FISH  studies is recommended. "  RADIOGRAPHIC  STUDIES:  I have personally reviewed the radiological images as listed and agreed with the findings in the report. MR BRAIN W WO CONTRAST  Result Date: 05/05/2020 CLINICAL DATA:  Follow-up multiple myeloma. 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 CONTRAST:  42m GADAVIST GADOBUTROL 1 MMOL/ML IV SOLN COMPARISON:  10/11/2019 FINDINGS: Brain: Decreased operative findings at the left frontal bone resection site with no nodular enhancement at the margins or dura to suggest residual or recurrent disease. Small FLAIR hyperintensities in the cerebral white matter attributed to chronic small vessel ischemia in this patient with hypertension. Even greater chronic small vessel ischemic changes in the pons. Mild gliosis in the left frontal pole cortex related to prior mass. No acute infarct, hemorrhage, hydrocephalus, or collection. Vascular: Normal flow voids and vascular enhancement Skull and upper cervical spine: Postoperative frontal bone as described above. There are a number of newly seen subcentimeter foci of nodular enhancement which are marked on series 16. These are different from intra osseous vascular channels seen on prior. None are seen to involve the dura with thickening or nodularity. Sinuses/Orbits: Negative IMPRESSION: 1. No evidence of left frontal bone lesion recurrence. 2. Multiple subcentimeter areas of nodular enhancement in the calvarium are nonspecific but could relate to myeloma given nonvisualization on prior. Electronically Signed   By: JMonte FantasiaM.D.   On: 05/05/2020 16:11   NM PET Image Restage (PS) Whole Body  Result Date: 05/11/2020 CLINICAL DATA:  Subsequent treatment strategy for multiple myeloma. EXAM: NUCLEAR MEDICINE PET WHOLE BODY TECHNIQUE: 8.7 mCi F-18 FDG was injected intravenously. Full-ring  PET imaging was performed from the head to foot after the radiotracer. CT data was obtained and used for attenuation correction and anatomic localization. Fasting blood glucose: 84 mg/dl COMPARISON:  11/10/19. FINDINGS: Mediastinal blood pool activity: SUV max 2.27 HEAD/NECK: No hypermetabolic activity in the scalp. No hypermetabolic cervical lymph nodes. Incidental CT findings: Previous left frontal bone craniectomy CHEST: No hypermetabolic mediastinal or hilar nodes. No suspicious pulmonary nodules on the CT scan. Incidental CT findings: none ABDOMEN/PELVIS: No abnormal hypermetabolic activity within the liver, pancreas, adrenal glands, or spleen. No hypermetabolic lymph nodes in the abdomen or pelvis. Incidental CT findings: Small fat containing left inguinal hernia. SKELETON: No focal hypermetabolic activity to suggest skeletal metastasis. Incidental CT findings: none EXTREMITIES: No abnormal hypermetabolic activity in the lower extremities. Incidental CT findings: none IMPRESSION: 1. No findings to suggest FDG avid lesions of multiple myeloma. 2. Status post left frontal craniectomy without signs of surrounding abnormal FDG uptake. Electronically Signed   By: TKerby MoorsM.D.   On: 05/11/2020 12:52   CT Biopsy  Result Date: 05/14/2020 INDICATION: Multiple myeloma, restaging, pre bone marrow transplant evaluation EXAM: CT GUIDED RIGHT ILIAC BONE MARROW ASPIRATION AND CORE BIOPSY Date:  05/14/2020 05/14/2020 10:13 am Radiologist:  M. TDaryll Brod MD Guidance:  CT FLUOROSCOPY TIME:  Fluoroscopy Time: None. MEDICATIONS: 1% lidocaine local ANESTHESIA/SEDATION: 4.0 mg IV Versed; 100 mcg IV Fentanyl Moderate Sedation Time:  10 minutes The patient was continuously monitored during the procedure by the interventional radiology nurse under my direct supervision. CONTRAST:  None. COMPLICATIONS: None PROCEDURE: Informed consent was  obtained from the patient following explanation of the procedure, risks, benefits and  alternatives. The patient understands, agrees and consents for the procedure. All questions were addressed. A time out was performed. The patient was positioned prone and non-contrast localization CT was performed of the pelvis to demonstrate the iliac marrow spaces. Maximal barrier sterile technique utilized including caps, mask, sterile gowns, sterile gloves, large sterile drape, hand hygiene, and Betadine prep. Under sterile conditions and local anesthesia, an 11 gauge coaxial bone biopsy needle was advanced into the right iliac marrow space. Needle position was confirmed with CT imaging. Initially, bone marrow aspiration was performed. Next, the 11 gauge outer cannula was utilized to obtain a right iliac bone marrow core biopsy. Needle was removed. Hemostasis was obtained with compression. The patient tolerated the procedure well. Samples were prepared with the cytotechnologist. No immediate complications. IMPRESSION: CT guided right iliac bone marrow aspiration and core biopsy. Electronically Signed   By: Jerilynn Mages.  Shick M.D.   On: 05/14/2020 10:22   CT BONE MARROW BIOPSY & ASPIRATION  Result Date: 05/14/2020 INDICATION: Multiple myeloma, restaging, pre bone marrow transplant evaluation EXAM: CT GUIDED RIGHT ILIAC BONE MARROW ASPIRATION AND CORE BIOPSY Date:  05/14/2020 05/14/2020 10:13 am Radiologist:  M. Daryll Brod, MD Guidance:  CT FLUOROSCOPY TIME:  Fluoroscopy Time: None. MEDICATIONS: 1% lidocaine local ANESTHESIA/SEDATION: 4.0 mg IV Versed; 100 mcg IV Fentanyl Moderate Sedation Time:  10 minutes The patient was continuously monitored during the procedure by the interventional radiology nurse under my direct supervision. CONTRAST:  None. COMPLICATIONS: None PROCEDURE: Informed consent was obtained from the patient following explanation of the procedure, risks, benefits and alternatives. The patient understands, agrees and consents for the procedure. All questions were addressed. A time out was performed. The  patient was positioned prone and non-contrast localization CT was performed of the pelvis to demonstrate the iliac marrow spaces. Maximal barrier sterile technique utilized including caps, mask, sterile gowns, sterile gloves, large sterile drape, hand hygiene, and Betadine prep. Under sterile conditions and local anesthesia, an 11 gauge coaxial bone biopsy needle was advanced into the right iliac marrow space. Needle position was confirmed with CT imaging. Initially, bone marrow aspiration was performed. Next, the 11 gauge outer cannula was utilized to obtain a right iliac bone marrow core biopsy. Needle was removed. Hemostasis was obtained with compression. The patient tolerated the procedure well. Samples were prepared with the cytotechnologist. No immediate complications. IMPRESSION: CT guided right iliac bone marrow aspiration and core biopsy. Electronically Signed   By: Jerilynn Mages.  Shick M.D.   On: 05/14/2020 10:22   MM 3D SCREEN BREAST BILATERAL  Result Date: 05/11/2020 CLINICAL DATA:  Screening. EXAM: DIGITAL SCREENING BILATERAL MAMMOGRAM WITH TOMOSYNTHESIS AND CAD TECHNIQUE: Bilateral screening digital craniocaudal and mediolateral oblique mammograms were obtained. Bilateral screening digital breast tomosynthesis was performed. The images were evaluated with computer-aided detection. COMPARISON:  Previous exam(s). ACR Breast Density Category b: There are scattered areas of fibroglandular density. FINDINGS: There are no findings suspicious for malignancy. The images were evaluated with computer-aided detection. IMPRESSION: No mammographic evidence of malignancy. A result letter of this screening mammogram will be mailed directly to the patient. RECOMMENDATION: Screening mammogram in one year. (Code:SM-B-01Y) BI-RADS CATEGORY  1: Negative. Electronically Signed   By: Evangeline Dakin M.D.   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 No M spike. Lambda light chain myeloma + Anemia + bone lesion on skull-- resected No renal failure, hypercalcemia  BMBx- 22-30% lambda restricted plasma cell MolCy Dup (1q), del (13q)  PLAN: -Discussed pt labwork today, 05/21/2020; blood counts stable, other lab pending. -Discussed pt PET Whole Body ([id number]) on 05/11/2020; no signs of active Myeloma at this time. -Discussed pt CT Bone Marrow Biopsy & Aspiration ([id number]) on 05/14/2020; 1% of cells. The original presentation was 30%. -Advised pt the cooling cap is called a Dignity cap and this would most likely not be recommended in the setting of a transplant and primary tumor presentation in the scalp. -Advised pt the transplant team takes primary care for the first 6 months. Will take care over maintenance treatment when the pt will start that. -Discussed Evusheld and pt's eligibility. Will send referral again. The pt notes she was never called. -The pt has no prohibitive toxicities from continuing [place] at this time. Continue Carflizomib at 36 mg/m^2. -The pt has no prohibitive toxicities from continuing 25 mg Revlimid 3 weeks on/1 week off. -Continue daily baby ASA, Valtrex, & B complex. -Continue Zometa q4weeks  -Will see back in 4 weeks with C8D1.   FOLLOW UP: Ambulatory for Evusheld administration Please schedule cycle 8 of KRD as per orders.  Port flush and labs with day 1 8 and 15 of each cycle. MD visit in 4 weeks with cycle 8-day 1    The total time spent in the appointment was 30 minutes and more than 50% was on counseling and direct patient cares, discussing BM Bx results and PETresults, ordering and mx of chemotherapy.   All of the patient's questions were answered with apparent satisfaction. The patient knows to call the clinic with any problems, questions or concerns.    Sullivan Lone MD [place] AAHIVMS [facility]  [facility] Hematology/Oncology Physician [facility]  (Office):       [phone] (Work cell):  [phone] (Fax):           [phone]  05/21/2020 9:57 AM  I, Reinaldo Raddle, am acting as scribe for Dr. Sullivan Lone, MD.   .I have reviewed the above documentation for accuracy and completeness, and I agree with the above. Brunetta Genera MD

## 2020-05-21 ENCOUNTER — Inpatient Hospital Stay: Payer: 59 | Attending: Hematology

## 2020-05-21 ENCOUNTER — Other Ambulatory Visit: Payer: Self-pay

## 2020-05-21 ENCOUNTER — Other Ambulatory Visit: Payer: 59

## 2020-05-21 ENCOUNTER — Other Ambulatory Visit (HOSPITAL_COMMUNITY): Payer: Self-pay

## 2020-05-21 ENCOUNTER — Inpatient Hospital Stay: Payer: 59

## 2020-05-21 ENCOUNTER — Inpatient Hospital Stay (HOSPITAL_BASED_OUTPATIENT_CLINIC_OR_DEPARTMENT_OTHER): Payer: 59 | Admitting: Hematology

## 2020-05-21 VITALS — BP 100/69 | HR 65 | Temp 97.7°F | Resp 16 | Ht 66.4 in | Wt 173.4 lb

## 2020-05-21 DIAGNOSIS — Z5111 Encounter for antineoplastic chemotherapy: Secondary | ICD-10-CM

## 2020-05-21 DIAGNOSIS — C9000 Multiple myeloma not having achieved remission: Secondary | ICD-10-CM

## 2020-05-21 DIAGNOSIS — Z95828 Presence of other vascular implants and grafts: Secondary | ICD-10-CM

## 2020-05-21 DIAGNOSIS — C7951 Secondary malignant neoplasm of bone: Secondary | ICD-10-CM

## 2020-05-21 DIAGNOSIS — Z5112 Encounter for antineoplastic immunotherapy: Secondary | ICD-10-CM | POA: Diagnosis not present

## 2020-05-21 DIAGNOSIS — Z7189 Other specified counseling: Secondary | ICD-10-CM

## 2020-05-21 DIAGNOSIS — C9001 Multiple myeloma in remission: Secondary | ICD-10-CM | POA: Insufficient documentation

## 2020-05-21 LAB — CMP (CANCER CENTER ONLY)
ALT: 10 U/L (ref 0–44)
AST: 14 U/L — ABNORMAL LOW (ref 15–41)
Albumin: 3.8 g/dL (ref 3.5–5.0)
Alkaline Phosphatase: 43 U/L (ref 38–126)
Anion gap: 9 (ref 5–15)
BUN: 15 mg/dL (ref 6–20)
CO2: 29 mmol/L (ref 22–32)
Calcium: 9 mg/dL (ref 8.9–10.3)
Chloride: 104 mmol/L (ref 98–111)
Creatinine: 0.78 mg/dL (ref 0.44–1.00)
GFR, Estimated: >60 mL/min (ref >60)
Glucose, Bld: 93 mg/dL (ref 70–99)
Potassium: 3.3 mmol/L — ABNORMAL LOW (ref 3.5–5.1)
Sodium: 142 mmol/L (ref 135–145)
Total Bilirubin: 1.1 mg/dL (ref 0.3–1.2)
Total Protein: 6.1 g/dL — ABNORMAL LOW (ref 6.5–8.1)

## 2020-05-21 LAB — CBC WITH DIFFERENTIAL/PLATELET
Abs Immature Granulocytes: 0.01 10*3/uL (ref 0.00–0.07)
Basophils Absolute: 0 10*3/uL (ref 0.0–0.1)
Basophils Relative: 1 %
Eosinophils Absolute: 0.1 10*3/uL (ref 0.0–0.5)
Eosinophils Relative: 3 %
HCT: 34 % — ABNORMAL LOW (ref 36.0–46.0)
Hemoglobin: 11.7 g/dL — ABNORMAL LOW (ref 12.0–15.0)
Immature Granulocytes: 0 %
Lymphocytes Relative: 32 %
Lymphs Abs: 1.1 10*3/uL (ref 0.7–4.0)
MCH: 32.1 pg (ref 26.0–34.0)
MCHC: 34.4 g/dL (ref 30.0–36.0)
MCV: 93.2 fL (ref 80.0–100.0)
Monocytes Absolute: 0.6 10*3/uL (ref 0.1–1.0)
Monocytes Relative: 17 %
Neutro Abs: 1.6 10*3/uL — ABNORMAL LOW (ref 1.7–7.7)
Neutrophils Relative %: 47 %
Platelets: 284 10*3/uL (ref 150–400)
RBC: 3.65 MIL/uL — ABNORMAL LOW (ref 3.87–5.11)
RDW: 15.3 % (ref 11.5–15.5)
WBC: 3.3 10*3/uL — ABNORMAL LOW (ref 4.0–10.5)
nRBC: 0 % (ref 0.0–0.2)

## 2020-05-21 MED ORDER — ZOLEDRONIC ACID 4 MG/100ML IV SOLN
4.0000 mg | Freq: Once | INTRAVENOUS | Status: AC
  Administered 2020-05-21: 4 mg via INTRAVENOUS

## 2020-05-21 MED ORDER — SODIUM CHLORIDE 0.9% FLUSH
10.0000 mL | Freq: Once | INTRAVENOUS | Status: AC
Start: 2020-05-21 — End: 2020-05-21
  Administered 2020-05-21: 10 mL
  Filled 2020-05-21: qty 10

## 2020-05-21 MED ORDER — ZOLEDRONIC ACID 4 MG/100ML IV SOLN
INTRAVENOUS | Status: AC
  Filled 2020-05-21: qty 100

## 2020-05-21 MED ORDER — SODIUM CHLORIDE 0.9 % IV SOLN
Freq: Once | INTRAVENOUS | Status: DC
  Filled 2020-05-21: qty 250

## 2020-05-21 MED ORDER — SODIUM CHLORIDE 0.9 % IV SOLN
Freq: Once | INTRAVENOUS | Status: AC
  Filled 2020-05-21: qty 250

## 2020-05-21 MED ORDER — SODIUM CHLORIDE 0.9 % IV SOLN
20.0000 mg | Freq: Once | INTRAVENOUS | Status: AC
  Administered 2020-05-21: 20 mg via INTRAVENOUS
  Filled 2020-05-21: qty 20

## 2020-05-21 MED ORDER — DEXTROSE 5 % IV SOLN
36.0000 mg/m2 | Freq: Once | INTRAVENOUS | Status: AC
  Administered 2020-05-21: 70 mg via INTRAVENOUS
  Filled 2020-05-21: qty 30

## 2020-05-21 MED FILL — Lidocaine-Prilocaine Cream 2.5-2.5%: CUTANEOUS | 30 days supply | Qty: 30 | Fill #0 | Status: AC

## 2020-05-21 NOTE — Patient Instructions (Signed)
[facility]  Discharge Instructions: Thank you for choosing [facility] to provide your oncology and hematology care.   If you have a lab appointment with the Cancer Center, please go directly to the Cancer Center and check in at the registration area.   Wear comfortable clothing and clothing appropriate for easy access to any Portacath or PICC line.   We strive to give you quality time with your provider. You may need to reschedule your appointment if you arrive late (15 or more minutes).  Arriving late affects you and other patients whose appointments are after yours.  Also, if you miss three or more appointments without notifying the office, you may be dismissed from the clinic at the provider's discretion.      For prescription refill requests, have your pharmacy contact our office and allow 72 hours for refills to be completed.    Today you received the following chemotherapy and/or immunotherapy agents kyprolis      To help prevent nausea and vomiting after your treatment, we encourage you to take your nausea medication as directed.  BELOW ARE SYMPTOMS THAT SHOULD BE REPORTED IMMEDIATELY: . *FEVER GREATER THAN 100.4 F (38 C) OR HIGHER . *CHILLS OR SWEATING . *NAUSEA AND VOMITING THAT IS NOT CONTROLLED WITH YOUR NAUSEA MEDICATION . *UNUSUAL SHORTNESS OF BREATH . *UNUSUAL BRUISING OR BLEEDING . *URINARY PROBLEMS (pain or burning when urinating, or frequent urination) . *BOWEL PROBLEMS (unusual diarrhea, constipation, pain near the anus) . TENDERNESS IN MOUTH AND THROAT WITH OR WITHOUT PRESENCE OF ULCERS (sore throat, sores in mouth, or a toothache) . UNUSUAL RASH, SWELLING OR PAIN  . UNUSUAL VAGINAL DISCHARGE OR ITCHING   Items with * indicate a potential emergency and should be followed up as soon as possible or go to the Emergency Department if any problems should occur.  Please show the CHEMOTHERAPY ALERT CARD or IMMUNOTHERAPY ALERT  CARD at check-in to the Emergency Department and triage nurse.  Should you have questions after your visit or need to cancel or reschedule your appointment, please contact [facility]  Dept: [phone]  and follow the prompts.  Office hours are 8:00 a.m. to 4:30 p.m. Monday - Friday. Please note that voicemails left after 4:00 p.m. may not be returned until the following business day.  We are closed weekends and major holidays. You have access to a nurse at all times for urgent questions. Please call the main number to the clinic Dept: [phone] and follow the prompts.   For any non-urgent questions, you may also contact your provider using MyChart. We now offer e-Visits for anyone 18 and older to request care online for non-urgent symptoms. For details visit [email or web address].   Also download the MyChart app! Go to the app store, search "MyChart", open the app, select , and log in with your MyChart username and password.  Due to Covid, a mask is required upon entering the hospital/clinic. If you do not have a mask, one will be given to you upon arrival. For doctor visits, patients may have 1 support person aged 18 or older with them. For treatment visits, patients cannot have anyone with them due to current Covid guidelines and our immunocompromised population.   

## 2020-05-22 ENCOUNTER — Other Ambulatory Visit: Payer: 59

## 2020-05-22 ENCOUNTER — Telehealth: Payer: Self-pay | Admitting: Adult Health

## 2020-05-22 ENCOUNTER — Ambulatory Visit: Payer: 59 | Admitting: Hematology

## 2020-05-22 ENCOUNTER — Inpatient Hospital Stay: Payer: 59

## 2020-05-22 ENCOUNTER — Encounter (HOSPITAL_COMMUNITY): Payer: Self-pay | Admitting: Hematology

## 2020-05-22 ENCOUNTER — Ambulatory Visit: Payer: 59

## 2020-05-22 VITALS — BP 113/69 | HR 60 | Temp 98.4°F | Resp 16

## 2020-05-22 DIAGNOSIS — C7951 Secondary malignant neoplasm of bone: Secondary | ICD-10-CM

## 2020-05-22 DIAGNOSIS — Z5112 Encounter for antineoplastic immunotherapy: Secondary | ICD-10-CM | POA: Diagnosis not present

## 2020-05-22 DIAGNOSIS — Z95828 Presence of other vascular implants and grafts: Secondary | ICD-10-CM

## 2020-05-22 DIAGNOSIS — Z7189 Other specified counseling: Secondary | ICD-10-CM

## 2020-05-22 DIAGNOSIS — C9000 Multiple myeloma not having achieved remission: Secondary | ICD-10-CM

## 2020-05-22 LAB — CBC WITH DIFFERENTIAL/PLATELET
Abs Immature Granulocytes: 0.04 10*3/uL (ref 0.00–0.07)
Basophils Absolute: 0 10*3/uL (ref 0.0–0.1)
Basophils Relative: 0 %
Eosinophils Absolute: 0 10*3/uL (ref 0.0–0.5)
Eosinophils Relative: 0 %
HCT: 33 % — ABNORMAL LOW (ref 36.0–46.0)
Hemoglobin: 11.1 g/dL — ABNORMAL LOW (ref 12.0–15.0)
Immature Granulocytes: 0 %
Lymphocytes Relative: 11 %
Lymphs Abs: 1 10*3/uL (ref 0.7–4.0)
MCH: 31.6 pg (ref 26.0–34.0)
MCHC: 33.6 g/dL (ref 30.0–36.0)
MCV: 94 fL (ref 80.0–100.0)
Monocytes Absolute: 1.1 10*3/uL — ABNORMAL HIGH (ref 0.1–1.0)
Monocytes Relative: 12 %
Neutro Abs: 6.9 10*3/uL (ref 1.7–7.7)
Neutrophils Relative %: 77 %
Platelets: 257 10*3/uL (ref 150–400)
RBC: 3.51 MIL/uL — ABNORMAL LOW (ref 3.87–5.11)
RDW: 15.2 % (ref 11.5–15.5)
WBC: 9 10*3/uL (ref 4.0–10.5)
nRBC: 0 % (ref 0.0–0.2)

## 2020-05-22 LAB — CMP (CANCER CENTER ONLY)
ALT: 9 U/L (ref 0–44)
AST: 11 U/L — ABNORMAL LOW (ref 15–41)
Albumin: 3.7 g/dL (ref 3.5–5.0)
Alkaline Phosphatase: 43 U/L (ref 38–126)
Anion gap: 8 (ref 5–15)
BUN: 17 mg/dL (ref 6–20)
CO2: 28 mmol/L (ref 22–32)
Calcium: 9.7 mg/dL (ref 8.9–10.3)
Chloride: 104 mmol/L (ref 98–111)
Creatinine: 0.76 mg/dL (ref 0.44–1.00)
GFR, Estimated: >60 mL/min (ref >60)
Glucose, Bld: 105 mg/dL — ABNORMAL HIGH (ref 70–99)
Potassium: 3.5 mmol/L (ref 3.5–5.1)
Sodium: 140 mmol/L (ref 135–145)
Total Bilirubin: 1 mg/dL (ref 0.3–1.2)
Total Protein: 5.9 g/dL — ABNORMAL LOW (ref 6.5–8.1)

## 2020-05-22 MED ORDER — HEPARIN SOD (PORK) LOCK FLUSH 100 UNIT/ML IV SOLN
500.0000 [IU] | Freq: Once | INTRAVENOUS | Status: AC | PRN
  Administered 2020-05-22: 500 [IU]
  Filled 2020-05-22: qty 5

## 2020-05-22 MED ORDER — PROCHLORPERAZINE MALEATE 10 MG PO TABS
ORAL_TABLET | ORAL | Status: AC
  Filled 2020-05-22: qty 1

## 2020-05-22 MED ORDER — SODIUM CHLORIDE 0.9% FLUSH
10.0000 mL | INTRAVENOUS | Status: DC | PRN
  Administered 2020-05-22: 10 mL
  Filled 2020-05-22: qty 10

## 2020-05-22 MED ORDER — SODIUM CHLORIDE 0.9 % IV SOLN
Freq: Once | INTRAVENOUS | Status: AC
  Filled 2020-05-22: qty 250

## 2020-05-22 MED ORDER — PROCHLORPERAZINE MALEATE 10 MG PO TABS
10.0000 mg | ORAL_TABLET | Freq: Once | ORAL | Status: AC
  Administered 2020-05-22: 10 mg via ORAL

## 2020-05-22 MED ORDER — DEXTROSE 5 % IV SOLN
36.0000 mg/m2 | Freq: Once | INTRAVENOUS | Status: AC
  Administered 2020-05-22: 70 mg via INTRAVENOUS
  Filled 2020-05-22: qty 30

## 2020-05-22 MED ORDER — SODIUM CHLORIDE 0.9 % IV SOLN
Freq: Once | INTRAVENOUS | Status: AC
Start: 2020-05-22 — End: 2020-05-22
  Filled 2020-05-22: qty 250

## 2020-05-22 MED ORDER — SODIUM CHLORIDE 0.9% FLUSH
10.0000 mL | Freq: Once | INTRAVENOUS | Status: AC
Start: 2020-05-22 — End: 2020-05-22
  Administered 2020-05-22: 10 mL
  Filled 2020-05-22: qty 10

## 2020-05-22 NOTE — Telephone Encounter (Signed)
I called patient to discuss Evusheld, a long acting monoclonal antibody injection administered to patients with decreased immune systems or intolerance/allergy to the COVID 19 vaccine as COVID19 prevention.    Unable to reach patient.  LMOM to return my call.  Suvi Archuletta, NP  

## 2020-05-22 NOTE — Patient Instructions (Signed)
[facility]  Discharge Instructions: Thank you for choosing [facility] to provide your oncology and hematology care.   If you have a lab appointment with the Cancer Center, please go directly to the Cancer Center and check in at the registration area.   Wear comfortable clothing and clothing appropriate for easy access to any Portacath or PICC line.   We strive to give you quality time with your provider. You may need to reschedule your appointment if you arrive late (15 or more minutes).  Arriving late affects you and other patients whose appointments are after yours.  Also, if you miss three or more appointments without notifying the office, you may be dismissed from the clinic at the provider's discretion.      For prescription refill requests, have your pharmacy contact our office and allow 72 hours for refills to be completed.    Today you received the following chemotherapy and/or immunotherapy agents kyprolis      To help prevent nausea and vomiting after your treatment, we encourage you to take your nausea medication as directed.  BELOW ARE SYMPTOMS THAT SHOULD BE REPORTED IMMEDIATELY: . *FEVER GREATER THAN 100.4 F (38 C) OR HIGHER . *CHILLS OR SWEATING . *NAUSEA AND VOMITING THAT IS NOT CONTROLLED WITH YOUR NAUSEA MEDICATION . *UNUSUAL SHORTNESS OF BREATH . *UNUSUAL BRUISING OR BLEEDING . *URINARY PROBLEMS (pain or burning when urinating, or frequent urination) . *BOWEL PROBLEMS (unusual diarrhea, constipation, pain near the anus) . TENDERNESS IN MOUTH AND THROAT WITH OR WITHOUT PRESENCE OF ULCERS (sore throat, sores in mouth, or a toothache) . UNUSUAL RASH, SWELLING OR PAIN  . UNUSUAL VAGINAL DISCHARGE OR ITCHING   Items with * indicate a potential emergency and should be followed up as soon as possible or go to the Emergency Department if any problems should occur.  Please show the CHEMOTHERAPY ALERT CARD or IMMUNOTHERAPY ALERT  CARD at check-in to the Emergency Department and triage nurse.  Should you have questions after your visit or need to cancel or reschedule your appointment, please contact [facility]  Dept: [phone]  and follow the prompts.  Office hours are 8:00 a.m. to 4:30 p.m. Monday - Friday. Please note that voicemails left after 4:00 p.m. may not be returned until the following business day.  We are closed weekends and major holidays. You have access to a nurse at all times for urgent questions. Please call the main number to the clinic Dept: [phone] and follow the prompts.   For any non-urgent questions, you may also contact your provider using MyChart. We now offer e-Visits for anyone 18 and older to request care online for non-urgent symptoms. For details visit [email or web address].   Also download the MyChart app! Go to the app store, search "MyChart", open the app, select [place] [facility], and log in with your MyChart username and password.  Due to Covid, a mask is required upon entering the hospital/clinic. If you do not have a mask, one will be given to you upon arrival. For doctor visits, patients may have 1 support person aged 18 or older with them. For treatment visits, patients cannot have anyone with them due to current Covid guidelines and our immunocompromised population.   

## 2020-05-22 NOTE — Patient Instructions (Signed)

## 2020-05-24 ENCOUNTER — Encounter (HOSPITAL_COMMUNITY): Payer: Self-pay | Admitting: Hematology

## 2020-05-25 LAB — MULTIPLE MYELOMA PANEL, SERUM
Albumin SerPl Elph-Mcnc: 3.7 g/dL (ref 2.9–4.4)
Albumin/Glob SerPl: 1.9 — ABNORMAL HIGH (ref 0.7–1.7)
Alpha 1: 0.2 g/dL (ref 0.0–0.4)
Alpha2 Glob SerPl Elph-Mcnc: 0.6 g/dL (ref 0.4–1.0)
B-Globulin SerPl Elph-Mcnc: 0.8 g/dL (ref 0.7–1.3)
Gamma Glob SerPl Elph-Mcnc: 0.4 g/dL (ref 0.4–1.8)
Globulin, Total: 2 g/dL — ABNORMAL LOW (ref 2.2–3.9)
IgA: 45 mg/dL — ABNORMAL LOW (ref 87–352)
IgG (Immunoglobin G), Serum: 502 mg/dL — ABNORMAL LOW (ref 586–1602)
IgM (Immunoglobulin M), Srm: 20 mg/dL — ABNORMAL LOW (ref 26–217)
Total Protein ELP: 5.7 g/dL — ABNORMAL LOW (ref 6.0–8.5)

## 2020-05-26 ENCOUNTER — Other Ambulatory Visit: Payer: Self-pay | Admitting: Hematology

## 2020-05-26 DIAGNOSIS — Z7189 Other specified counseling: Secondary | ICD-10-CM

## 2020-05-26 DIAGNOSIS — C9000 Multiple myeloma not having achieved remission: Secondary | ICD-10-CM

## 2020-05-28 ENCOUNTER — Other Ambulatory Visit: Payer: Self-pay

## 2020-05-28 ENCOUNTER — Other Ambulatory Visit: Payer: 59

## 2020-05-28 ENCOUNTER — Inpatient Hospital Stay: Payer: 59

## 2020-05-28 VITALS — BP 95/69 | HR 69 | Temp 99.3°F | Resp 17

## 2020-05-28 VITALS — BP 96/67 | HR 60 | Temp 98.4°F | Resp 16 | Wt 172.4 lb

## 2020-05-28 DIAGNOSIS — Z5112 Encounter for antineoplastic immunotherapy: Secondary | ICD-10-CM | POA: Diagnosis not present

## 2020-05-28 DIAGNOSIS — C7951 Secondary malignant neoplasm of bone: Secondary | ICD-10-CM

## 2020-05-28 DIAGNOSIS — Z7189 Other specified counseling: Secondary | ICD-10-CM

## 2020-05-28 DIAGNOSIS — C9000 Multiple myeloma not having achieved remission: Secondary | ICD-10-CM

## 2020-05-28 DIAGNOSIS — Z95828 Presence of other vascular implants and grafts: Secondary | ICD-10-CM

## 2020-05-28 LAB — CMP (CANCER CENTER ONLY)
ALT: 11 U/L (ref 0–44)
AST: 13 U/L — ABNORMAL LOW (ref 15–41)
Albumin: 3.9 g/dL (ref 3.5–5.0)
Alkaline Phosphatase: 48 U/L (ref 38–126)
Anion gap: 11 (ref 5–15)
BUN: 7 mg/dL (ref 6–20)
CO2: 28 mmol/L (ref 22–32)
Calcium: 8.7 mg/dL — ABNORMAL LOW (ref 8.9–10.3)
Chloride: 103 mmol/L (ref 98–111)
Creatinine: 0.78 mg/dL (ref 0.44–1.00)
GFR, Estimated: >60 mL/min (ref >60)
Glucose, Bld: 84 mg/dL (ref 70–99)
Potassium: 3.4 mmol/L — ABNORMAL LOW (ref 3.5–5.1)
Sodium: 142 mmol/L (ref 135–145)
Total Bilirubin: 1.3 mg/dL — ABNORMAL HIGH (ref 0.3–1.2)
Total Protein: 6.2 g/dL — ABNORMAL LOW (ref 6.5–8.1)

## 2020-05-28 LAB — CBC WITH DIFFERENTIAL/PLATELET
Abs Immature Granulocytes: 0.03 10*3/uL (ref 0.00–0.07)
Basophils Absolute: 0 10*3/uL (ref 0.0–0.1)
Basophils Relative: 0 %
Eosinophils Absolute: 0.2 10*3/uL (ref 0.0–0.5)
Eosinophils Relative: 5 %
HCT: 34.3 % — ABNORMAL LOW (ref 36.0–46.0)
Hemoglobin: 11.6 g/dL — ABNORMAL LOW (ref 12.0–15.0)
Immature Granulocytes: 1 %
Lymphocytes Relative: 21 %
Lymphs Abs: 1 10*3/uL (ref 0.7–4.0)
MCH: 32.2 pg (ref 26.0–34.0)
MCHC: 33.8 g/dL (ref 30.0–36.0)
MCV: 95.3 fL (ref 80.0–100.0)
Monocytes Absolute: 0.6 10*3/uL (ref 0.1–1.0)
Monocytes Relative: 14 %
Neutro Abs: 2.7 10*3/uL (ref 1.7–7.7)
Neutrophils Relative %: 59 %
Platelets: 181 10*3/uL (ref 150–400)
RBC: 3.6 MIL/uL — ABNORMAL LOW (ref 3.87–5.11)
RDW: 15.3 % (ref 11.5–15.5)
WBC: 4.5 10*3/uL (ref 4.0–10.5)
nRBC: 0 % (ref 0.0–0.2)

## 2020-05-28 MED ORDER — SODIUM CHLORIDE 0.9 % IV SOLN
Freq: Once | INTRAVENOUS | Status: AC
Start: 2020-05-28 — End: 2020-05-28
  Filled 2020-05-28: qty 250

## 2020-05-28 MED ORDER — DEXTROSE 5 % IV SOLN
36.0000 mg/m2 | Freq: Once | INTRAVENOUS | Status: AC
  Administered 2020-05-28: 70 mg via INTRAVENOUS
  Filled 2020-05-28: qty 30

## 2020-05-28 MED ORDER — DEXAMETHASONE SODIUM PHOSPHATE 100 MG/10ML IJ SOLN
20.0000 mg | Freq: Once | INTRAMUSCULAR | Status: AC
  Administered 2020-05-28: 20 mg via INTRAVENOUS
  Filled 2020-05-28: qty 20

## 2020-05-28 MED ORDER — SODIUM CHLORIDE 0.9% FLUSH
10.0000 mL | Freq: Once | INTRAVENOUS | Status: AC
  Administered 2020-05-28: 10 mL
  Filled 2020-05-28: qty 10

## 2020-05-28 MED ORDER — SODIUM CHLORIDE 0.9 % IV SOLN
20.0000 mg | Freq: Once | INTRAVENOUS | Status: DC
  Filled 2020-05-28: qty 2

## 2020-05-28 MED ORDER — SODIUM CHLORIDE 0.9% FLUSH
10.0000 mL | INTRAVENOUS | Status: DC | PRN
  Administered 2020-05-28: 10 mL
  Filled 2020-05-28: qty 10

## 2020-05-28 MED ORDER — HEPARIN SOD (PORK) LOCK FLUSH 100 UNIT/ML IV SOLN
500.0000 [IU] | Freq: Once | INTRAVENOUS | Status: AC | PRN
  Administered 2020-05-28: 500 [IU]
  Filled 2020-05-28: qty 5

## 2020-05-28 MED ORDER — SODIUM CHLORIDE 0.9 % IV SOLN
Freq: Once | INTRAVENOUS | Status: DC
  Filled 2020-05-28: qty 250

## 2020-05-28 NOTE — Patient Instructions (Signed)
[facility]  Discharge Instructions: Thank you for choosing [facility] to provide your oncology and hematology care.   If you have a lab appointment with the Cancer Center, please go directly to the Cancer Center and check in at the registration area.   Wear comfortable clothing and clothing appropriate for easy access to any Portacath or PICC line.   We strive to give you quality time with your provider. You may need to reschedule your appointment if you arrive late (15 or more minutes).  Arriving late affects you and other patients whose appointments are after yours.  Also, if you miss three or more appointments without notifying the office, you may be dismissed from the clinic at the provider's discretion.      For prescription refill requests, have your pharmacy contact our office and allow 72 hours for refills to be completed.    Today you received the following chemotherapy and/or immunotherapy agents: Kyprolis    To help prevent nausea and vomiting after your treatment, we encourage you to take your nausea medication as directed.  BELOW ARE SYMPTOMS THAT SHOULD BE REPORTED IMMEDIATELY: . *FEVER GREATER THAN 100.4 F (38 C) OR HIGHER . *CHILLS OR SWEATING . *NAUSEA AND VOMITING THAT IS NOT CONTROLLED WITH YOUR NAUSEA MEDICATION . *UNUSUAL SHORTNESS OF BREATH . *UNUSUAL BRUISING OR BLEEDING . *URINARY PROBLEMS (pain or burning when urinating, or frequent urination) . *BOWEL PROBLEMS (unusual diarrhea, constipation, pain near the anus) . TENDERNESS IN MOUTH AND THROAT WITH OR WITHOUT PRESENCE OF ULCERS (sore throat, sores in mouth, or a toothache) . UNUSUAL RASH, SWELLING OR PAIN  . UNUSUAL VAGINAL DISCHARGE OR ITCHING   Items with * indicate a potential emergency and should be followed up as soon as possible or go to the Emergency Department if any problems should occur.  Please show the CHEMOTHERAPY ALERT CARD or IMMUNOTHERAPY ALERT  CARD at check-in to the Emergency Department and triage nurse.  Should you have questions after your visit or need to cancel or reschedule your appointment, please contact [facility]  Dept: [phone]  and follow the prompts.  Office hours are 8:00 a.m. to 4:30 p.m. Monday - Friday. Please note that voicemails left after 4:00 p.m. may not be returned until the following business day.  We are closed weekends and major holidays. You have access to a nurse at all times for urgent questions. Please call the main number to the clinic Dept: [phone] and follow the prompts.   For any non-urgent questions, you may also contact your provider using MyChart. We now offer e-Visits for anyone 18 and older to request care online for non-urgent symptoms. For details visit [email or web address].   Also download the MyChart app! Go to the app store, search "MyChart", open the app, select [facility], and log in with your MyChart username and password.  Due to Covid, a mask is required upon entering the hospital/clinic. If you do not have a mask, one will be given to you upon arrival. For doctor visits, patients may have 1 support person aged 18 or older with them. For treatment visits, patients cannot have anyone with them due to current Covid guidelines and our immunocompromised population.   

## 2020-05-29 ENCOUNTER — Other Ambulatory Visit: Payer: 59

## 2020-05-29 ENCOUNTER — Inpatient Hospital Stay: Payer: 59

## 2020-05-29 VITALS — BP 110/69 | HR 61 | Temp 98.7°F | Resp 16

## 2020-05-29 DIAGNOSIS — C7951 Secondary malignant neoplasm of bone: Secondary | ICD-10-CM

## 2020-05-29 DIAGNOSIS — Z5112 Encounter for antineoplastic immunotherapy: Secondary | ICD-10-CM | POA: Diagnosis not present

## 2020-05-29 DIAGNOSIS — C9000 Multiple myeloma not having achieved remission: Secondary | ICD-10-CM

## 2020-05-29 DIAGNOSIS — Z7189 Other specified counseling: Secondary | ICD-10-CM

## 2020-05-29 LAB — KAPPA/LAMBDA LIGHT CHAINS
Kappa free light chain: 8.4 mg/L (ref 3.3–19.4)
Kappa, lambda light chain ratio: 0.72 (ref 0.26–1.65)
Lambda free light chains: 11.6 mg/L (ref 5.7–26.3)

## 2020-05-29 MED ORDER — SODIUM CHLORIDE 0.9 % IV SOLN
Freq: Once | INTRAVENOUS | Status: AC
  Filled 2020-05-29: qty 250

## 2020-05-29 MED ORDER — DEXTROSE 5 % IV SOLN
36.0000 mg/m2 | Freq: Once | INTRAVENOUS | Status: AC
  Administered 2020-05-29: 70 mg via INTRAVENOUS
  Filled 2020-05-29: qty 30

## 2020-05-29 MED ORDER — SODIUM CHLORIDE 0.9% FLUSH
10.0000 mL | INTRAVENOUS | Status: DC | PRN
  Administered 2020-05-29: 10 mL
  Filled 2020-05-29: qty 10

## 2020-05-29 MED ORDER — SODIUM CHLORIDE 0.9 % IV SOLN
Freq: Once | INTRAVENOUS | Status: DC
  Filled 2020-05-29: qty 250

## 2020-05-29 MED ORDER — HEPARIN SOD (PORK) LOCK FLUSH 100 UNIT/ML IV SOLN
500.0000 [IU] | Freq: Once | INTRAVENOUS | Status: AC | PRN
  Administered 2020-05-29: 500 [IU]
  Filled 2020-05-29: qty 5

## 2020-05-29 MED ORDER — PROCHLORPERAZINE MALEATE 10 MG PO TABS
ORAL_TABLET | ORAL | Status: AC
  Filled 2020-05-29: qty 1

## 2020-05-29 MED ORDER — PROCHLORPERAZINE MALEATE 10 MG PO TABS
10.0000 mg | ORAL_TABLET | Freq: Once | ORAL | Status: AC
Start: 2020-05-29 — End: 2020-05-29
  Administered 2020-05-29: 10 mg via ORAL

## 2020-05-29 NOTE — Patient Instructions (Signed)
[facility]  Discharge Instructions: Thank you for choosing [facility] to provide your oncology and hematology care.   If you have a lab appointment with the Cancer Center, please go directly to the Cancer Center and check in at the registration area.   Wear comfortable clothing and clothing appropriate for easy access to any Portacath or PICC line.   We strive to give you quality time with your provider. You may need to reschedule your appointment if you arrive late (15 or more minutes).  Arriving late affects you and other patients whose appointments are after yours.  Also, if you miss three or more appointments without notifying the office, you may be dismissed from the clinic at the provider's discretion.      For prescription refill requests, have your pharmacy contact our office and allow 72 hours for refills to be completed.    Today you received the following chemotherapy and/or immunotherapy agents kyprolis      To help prevent nausea and vomiting after your treatment, we encourage you to take your nausea medication as directed.  BELOW ARE SYMPTOMS THAT SHOULD BE REPORTED IMMEDIATELY: . *FEVER GREATER THAN 100.4 F (38 C) OR HIGHER . *CHILLS OR SWEATING . *NAUSEA AND VOMITING THAT IS NOT CONTROLLED WITH YOUR NAUSEA MEDICATION . *UNUSUAL SHORTNESS OF BREATH . *UNUSUAL BRUISING OR BLEEDING . *URINARY PROBLEMS (pain or burning when urinating, or frequent urination) . *BOWEL PROBLEMS (unusual diarrhea, constipation, pain near the anus) . TENDERNESS IN MOUTH AND THROAT WITH OR WITHOUT PRESENCE OF ULCERS (sore throat, sores in mouth, or a toothache) . UNUSUAL RASH, SWELLING OR PAIN  . UNUSUAL VAGINAL DISCHARGE OR ITCHING   Items with * indicate a potential emergency and should be followed up as soon as possible or go to the Emergency Department if any problems should occur.  Please show the CHEMOTHERAPY ALERT CARD or IMMUNOTHERAPY ALERT  CARD at check-in to the Emergency Department and triage nurse.  Should you have questions after your visit or need to cancel or reschedule your appointment, please contact [facility]  Dept: [phone]  and follow the prompts.  Office hours are 8:00 a.m. to 4:30 p.m. Monday - Friday. Please note that voicemails left after 4:00 p.m. may not be returned until the following business day.  We are closed weekends and major holidays. You have access to a nurse at all times for urgent questions. Please call the main number to the clinic Dept: [phone] and follow the prompts.   For any non-urgent questions, you may also contact your provider using MyChart. We now offer e-Visits for anyone 18 and older to request care online for non-urgent symptoms. For details visit [email or web address].   Also download the MyChart app! Go to the app store, search "MyChart", open the app, select , and log in with your MyChart username and password.  Due to Covid, a mask is required upon entering the hospital/clinic. If you do not have a mask, one will be given to you upon arrival. For doctor visits, patients may have 1 support person aged 18 or older with them. For treatment visits, patients cannot have anyone with them due to current Covid guidelines and our immunocompromised population.   

## 2020-05-30 ENCOUNTER — Telehealth: Payer: Self-pay | Admitting: Hematology

## 2020-05-30 NOTE — Telephone Encounter (Signed)
Called to inform patient of her upcoming appointments. Patient is aware and had questions for the provider regarding her treatments. Sent message to provider.

## 2020-05-31 ENCOUNTER — Telehealth: Payer: Self-pay

## 2020-05-31 ENCOUNTER — Other Ambulatory Visit: Payer: Self-pay

## 2020-05-31 DIAGNOSIS — Z7189 Other specified counseling: Secondary | ICD-10-CM

## 2020-05-31 DIAGNOSIS — C9000 Multiple myeloma not having achieved remission: Secondary | ICD-10-CM

## 2020-05-31 MED ORDER — LENALIDOMIDE 25 MG PO CAPS: ORAL_CAPSULE | ORAL | 0 refills | Status: DC

## 2020-05-31 NOTE — Telephone Encounter (Signed)
Contacted pt to let her Know per Dr Irene Limbo: CVC will not affect  treatment. Treatments would be held atleast 2 weeks prior to stem cell collection.  Pt going to [facility] is a few weeks for stem cell transplant work up. 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 Made pt aware we will leave her appointments on the books as is until we get final ok and dates for transplant. Pt acknowledged that we will hold her treatments 2 weeks prior to her transplant.

## 2020-06-01 LAB — MULTIPLE MYELOMA PANEL, SERUM
Albumin SerPl Elph-Mcnc: 3.8 g/dL (ref 2.9–4.4)
Albumin/Glob SerPl: 1.9 — ABNORMAL HIGH (ref 0.7–1.7)
Alpha 1: 0.2 g/dL (ref 0.0–0.4)
Alpha2 Glob SerPl Elph-Mcnc: 0.6 g/dL (ref 0.4–1.0)
B-Globulin SerPl Elph-Mcnc: 0.9 g/dL (ref 0.7–1.3)
Gamma Glob SerPl Elph-Mcnc: 0.4 g/dL (ref 0.4–1.8)
Globulin, Total: 2.1 g/dL — ABNORMAL LOW (ref 2.2–3.9)
IgA: 40 mg/dL — ABNORMAL LOW (ref 87–352)
IgG (Immunoglobin G), Serum: 506 mg/dL — ABNORMAL LOW (ref 586–1602)
IgM (Immunoglobulin M), Srm: 18 mg/dL — ABNORMAL LOW (ref 26–217)
Total Protein ELP: 5.9 g/dL — ABNORMAL LOW (ref 6.0–8.5)

## 2020-06-04 ENCOUNTER — Other Ambulatory Visit: Payer: Self-pay

## 2020-06-04 ENCOUNTER — Other Ambulatory Visit: Payer: 59

## 2020-06-04 ENCOUNTER — Inpatient Hospital Stay: Payer: 59

## 2020-06-04 VITALS — BP 92/66 | HR 64 | Temp 98.4°F | Resp 16 | Wt 175.0 lb

## 2020-06-04 DIAGNOSIS — Z5112 Encounter for antineoplastic immunotherapy: Secondary | ICD-10-CM | POA: Diagnosis not present

## 2020-06-04 DIAGNOSIS — C7951 Secondary malignant neoplasm of bone: Secondary | ICD-10-CM

## 2020-06-04 DIAGNOSIS — Z7189 Other specified counseling: Secondary | ICD-10-CM

## 2020-06-04 DIAGNOSIS — C9000 Multiple myeloma not having achieved remission: Secondary | ICD-10-CM

## 2020-06-04 DIAGNOSIS — Z95828 Presence of other vascular implants and grafts: Secondary | ICD-10-CM

## 2020-06-04 LAB — CMP (CANCER CENTER ONLY)
ALT: 12 U/L (ref 0–44)
AST: 16 U/L (ref 15–41)
Albumin: 3.7 g/dL (ref 3.5–5.0)
Alkaline Phosphatase: 42 U/L (ref 38–126)
Anion gap: 7 (ref 5–15)
BUN: 8 mg/dL (ref 6–20)
CO2: 29 mmol/L (ref 22–32)
Calcium: 9 mg/dL (ref 8.9–10.3)
Chloride: 106 mmol/L (ref 98–111)
Creatinine: 0.79 mg/dL (ref 0.44–1.00)
GFR, Estimated: >60 mL/min (ref >60)
Glucose, Bld: 92 mg/dL (ref 70–99)
Potassium: 3.3 mmol/L — ABNORMAL LOW (ref 3.5–5.1)
Sodium: 142 mmol/L (ref 135–145)
Total Bilirubin: 1.4 mg/dL — ABNORMAL HIGH (ref 0.3–1.2)
Total Protein: 5.9 g/dL — ABNORMAL LOW (ref 6.5–8.1)

## 2020-06-04 LAB — CBC WITH DIFFERENTIAL/PLATELET
Abs Immature Granulocytes: 0.02 10*3/uL (ref 0.00–0.07)
Basophils Absolute: 0 10*3/uL (ref 0.0–0.1)
Basophils Relative: 1 %
Eosinophils Absolute: 0.3 10*3/uL (ref 0.0–0.5)
Eosinophils Relative: 9 %
HCT: 31.4 % — ABNORMAL LOW (ref 36.0–46.0)
Hemoglobin: 10.7 g/dL — ABNORMAL LOW (ref 12.0–15.0)
Immature Granulocytes: 1 %
Lymphocytes Relative: 24 %
Lymphs Abs: 0.9 10*3/uL (ref 0.7–4.0)
MCH: 32.3 pg (ref 26.0–34.0)
MCHC: 34.1 g/dL (ref 30.0–36.0)
MCV: 94.9 fL (ref 80.0–100.0)
Monocytes Absolute: 0.6 10*3/uL (ref 0.1–1.0)
Monocytes Relative: 15 %
Neutro Abs: 1.9 10*3/uL (ref 1.7–7.7)
Neutrophils Relative %: 50 %
Platelets: 154 10*3/uL (ref 150–400)
RBC: 3.31 MIL/uL — ABNORMAL LOW (ref 3.87–5.11)
RDW: 15.5 % (ref 11.5–15.5)
WBC: 3.7 10*3/uL — ABNORMAL LOW (ref 4.0–10.5)
nRBC: 0 % (ref 0.0–0.2)

## 2020-06-04 MED ORDER — HEPARIN SOD (PORK) LOCK FLUSH 100 UNIT/ML IV SOLN
500.0000 [IU] | Freq: Once | INTRAVENOUS | Status: AC | PRN
  Administered 2020-06-04: 500 [IU]
  Filled 2020-06-04: qty 5

## 2020-06-04 MED ORDER — SODIUM CHLORIDE 0.9% FLUSH
10.0000 mL | Freq: Once | INTRAVENOUS | Status: AC
Start: 2020-06-04 — End: 2020-06-04
  Administered 2020-06-04: 10 mL
  Filled 2020-06-04: qty 10

## 2020-06-04 MED ORDER — DEXTROSE 5 % IV SOLN
36.0000 mg/m2 | Freq: Once | INTRAVENOUS | Status: AC
  Administered 2020-06-04: 70 mg via INTRAVENOUS
  Filled 2020-06-04: qty 30

## 2020-06-04 MED ORDER — SODIUM CHLORIDE 0.9% FLUSH
10.0000 mL | INTRAVENOUS | Status: DC | PRN
  Administered 2020-06-04: 10 mL
  Filled 2020-06-04: qty 10

## 2020-06-04 MED ORDER — SODIUM CHLORIDE 0.9 % IV SOLN
Freq: Once | INTRAVENOUS | Status: AC
  Filled 2020-06-04: qty 250

## 2020-06-04 MED ORDER — SODIUM CHLORIDE 0.9 % IV SOLN
20.0000 mg | Freq: Once | INTRAVENOUS | Status: AC
  Administered 2020-06-04: 20 mg via INTRAVENOUS
  Filled 2020-06-04: qty 20

## 2020-06-04 NOTE — Patient Instructions (Signed)
[facility]  Discharge Instructions: Thank you for choosing [facility] to provide your oncology and hematology care.   If you have a lab appointment with the Cancer Center, please go directly to the Cancer Center and check in at the registration area.   Wear comfortable clothing and clothing appropriate for easy access to any Portacath or PICC line.   We strive to give you quality time with your provider. You may need to reschedule your appointment if you arrive late (15 or more minutes).  Arriving late affects you and other patients whose appointments are after yours.  Also, if you miss three or more appointments without notifying the office, you may be dismissed from the clinic at the provider's discretion.      For prescription refill requests, have your pharmacy contact our office and allow 72 hours for refills to be completed.    Today you received the following chemotherapy and/or immunotherapy agent: Carfilzomib (Kyprolis).    To help prevent nausea and vomiting after your treatment, we encourage you to take your nausea medication as directed.  BELOW ARE SYMPTOMS THAT SHOULD BE REPORTED IMMEDIATELY: *FEVER GREATER THAN 100.4 F (38 C) OR HIGHER *CHILLS OR SWEATING *NAUSEA AND VOMITING THAT IS NOT CONTROLLED WITH YOUR NAUSEA MEDICATION *UNUSUAL SHORTNESS OF BREATH *UNUSUAL BRUISING OR BLEEDING *URINARY PROBLEMS (pain or burning when urinating, or frequent urination) *BOWEL PROBLEMS (unusual diarrhea, constipation, pain near the anus) TENDERNESS IN MOUTH AND THROAT WITH OR WITHOUT PRESENCE OF ULCERS (sore throat, sores in mouth, or a toothache) UNUSUAL RASH, SWELLING OR PAIN  UNUSUAL VAGINAL DISCHARGE OR ITCHING   Items with * indicate a potential emergency and should be followed up as soon as possible or go to the Emergency Department if any problems should occur.  Please show the CHEMOTHERAPY ALERT CARD or IMMUNOTHERAPY ALERT CARD at  check-in to the Emergency Department and triage nurse.  Should you have questions after your visit or need to cancel or reschedule your appointment, please contact [facility]  Dept: [phone]  and follow the prompts.  Office hours are 8:00 a.m. to 4:30 p.m. Monday - Friday. Please note that voicemails left after 4:00 p.m. may not be returned until the following business day.  We are closed weekends and major holidays. You have access to a nurse at all times for urgent questions. Please call the main number to the clinic Dept: [phone] and follow the prompts.   For any non-urgent questions, you may also contact your provider using MyChart. We now offer e-Visits for anyone 18 and older to request care online for non-urgent symptoms. For details visit [email or web address].   Also download the MyChart app! Go to the app store, search "MyChart", open the app, select [facility], and log in with your MyChart username and password.  Due to Covid, a mask is required upon entering the hospital/clinic. If you do not have a mask, one will be given to you upon arrival. For doctor visits, patients may have 1 support person aged 18 or older with them. For treatment visits, patients cannot have anyone with them due to current Covid guidelines and our immunocompromised population.   

## 2020-06-05 ENCOUNTER — Inpatient Hospital Stay: Payer: 59

## 2020-06-05 ENCOUNTER — Other Ambulatory Visit: Payer: 59

## 2020-06-05 VITALS — BP 141/85 | HR 64 | Temp 98.3°F | Resp 16

## 2020-06-05 DIAGNOSIS — Z5112 Encounter for antineoplastic immunotherapy: Secondary | ICD-10-CM | POA: Diagnosis not present

## 2020-06-05 DIAGNOSIS — C9000 Multiple myeloma not having achieved remission: Secondary | ICD-10-CM

## 2020-06-05 DIAGNOSIS — Z7189 Other specified counseling: Secondary | ICD-10-CM

## 2020-06-05 DIAGNOSIS — C7951 Secondary malignant neoplasm of bone: Secondary | ICD-10-CM

## 2020-06-05 MED ORDER — HEPARIN SOD (PORK) LOCK FLUSH 100 UNIT/ML IV SOLN
500.0000 [IU] | Freq: Once | INTRAVENOUS | Status: AC | PRN
  Administered 2020-06-05: 500 [IU]
  Filled 2020-06-05: qty 5

## 2020-06-05 MED ORDER — DEXTROSE 5 % IV SOLN
36.0000 mg/m2 | Freq: Once | INTRAVENOUS | Status: AC
  Administered 2020-06-05: 70 mg via INTRAVENOUS
  Filled 2020-06-05: qty 30

## 2020-06-05 MED ORDER — SODIUM CHLORIDE 0.9 % IV SOLN
Freq: Once | INTRAVENOUS | Status: AC
Start: 2020-06-05 — End: 2020-06-05
  Filled 2020-06-05: qty 250

## 2020-06-05 MED ORDER — SODIUM CHLORIDE 0.9% FLUSH
10.0000 mL | INTRAVENOUS | Status: DC | PRN
  Administered 2020-06-05: 10 mL
  Filled 2020-06-05: qty 10

## 2020-06-05 MED ORDER — PROCHLORPERAZINE MALEATE 10 MG PO TABS
10.0000 mg | ORAL_TABLET | Freq: Once | ORAL | Status: AC
Start: 2020-06-05 — End: 2020-06-05
  Administered 2020-06-05: 10 mg via ORAL

## 2020-06-05 MED ORDER — PROCHLORPERAZINE MALEATE 10 MG PO TABS
ORAL_TABLET | ORAL | Status: AC
  Filled 2020-06-05: qty 1

## 2020-06-05 NOTE — Patient Instructions (Signed)
[facility]  Discharge Instructions: Thank you for choosing [facility] to provide your oncology and hematology care.   If you have a lab appointment with the Cancer Center, please go directly to the Cancer Center and check in at the registration area.   Wear comfortable clothing and clothing appropriate for easy access to any Portacath or PICC line.   We strive to give you quality time with your provider. You may need to reschedule your appointment if you arrive late (15 or more minutes).  Arriving late affects you and other patients whose appointments are after yours.  Also, if you miss three or more appointments without notifying the office, you may be dismissed from the clinic at the provider's discretion.      For prescription refill requests, have your pharmacy contact our office and allow 72 hours for refills to be completed.    Today you received the following chemotherapy and/or immunotherapy agents carfilzomib   To help prevent nausea and vomiting after your treatment, we encourage you to take your nausea medication as directed.  BELOW ARE SYMPTOMS THAT SHOULD BE REPORTED IMMEDIATELY: . *FEVER GREATER THAN 100.4 F (38 C) OR HIGHER . *CHILLS OR SWEATING . *NAUSEA AND VOMITING THAT IS NOT CONTROLLED WITH YOUR NAUSEA MEDICATION . *UNUSUAL SHORTNESS OF BREATH . *UNUSUAL BRUISING OR BLEEDING . *URINARY PROBLEMS (pain or burning when urinating, or frequent urination) . *BOWEL PROBLEMS (unusual diarrhea, constipation, pain near the anus) . TENDERNESS IN MOUTH AND THROAT WITH OR WITHOUT PRESENCE OF ULCERS (sore throat, sores in mouth, or a toothache) . UNUSUAL RASH, SWELLING OR PAIN  . UNUSUAL VAGINAL DISCHARGE OR ITCHING   Items with * indicate a potential emergency and should be followed up as soon as possible or go to the Emergency Department if any problems should occur.  Please show the CHEMOTHERAPY ALERT CARD or IMMUNOTHERAPY ALERT  CARD at check-in to the Emergency Department and triage nurse.  Should you have questions after your visit or need to cancel or reschedule your appointment, please contact [facility]  Dept: [phone]  and follow the prompts.  Office hours are 8:00 a.m. to 4:30 p.m. Monday - Friday. Please note that voicemails left after 4:00 p.m. may not be returned until the following business day.  We are closed weekends and major holidays. You have access to a nurse at all times for urgent questions. Please call the main number to the clinic Dept: [phone] and follow the prompts.   For any non-urgent questions, you may also contact your provider using MyChart. We now offer e-Visits for anyone 18 and older to request care online for non-urgent symptoms. For details visit [email or web address].   Also download the MyChart app! Go to the app store, search "MyChart", open the app, select [facility] [place], and log in with your MyChart username and password.  Due to Covid, a mask is required upon entering the hospital/clinic. If you do not have a mask, one will be given to you upon arrival. For doctor visits, patients may have 1 support person aged 18 or older with them. For treatment visits, patients cannot have anyone with them due to current Covid guidelines and our immunocompromised population.   

## 2020-06-09 ENCOUNTER — Other Ambulatory Visit: Payer: Self-pay | Admitting: Nurse Practitioner

## 2020-06-09 DIAGNOSIS — R7303 Prediabetes: Secondary | ICD-10-CM

## 2020-06-14 LAB — SURGICAL PATHOLOGY

## 2020-06-15 ENCOUNTER — Other Ambulatory Visit: Payer: Self-pay

## 2020-06-15 ENCOUNTER — Telehealth: Payer: Self-pay

## 2020-06-15 ENCOUNTER — Inpatient Hospital Stay: Payer: 59 | Admitting: Licensed Clinical Social Worker

## 2020-06-15 DIAGNOSIS — C9000 Multiple myeloma not having achieved remission: Secondary | ICD-10-CM

## 2020-06-15 NOTE — Telephone Encounter (Addendum)
"  Alexandra [name] [phone]) calling regarding about loan form status.  Currently in lobby now after completing appointment for Advanced Directives."  Provided envelope with completed form, fax confirmation, [facility] Release Authorization of disclosure to use as needed for record release by [facility] (SW) H.I[employer] who's phone and fax numbers on fax cover sheet.   Confirms receipt of [employer] request from provider.  Currently no questions or needs.

## 2020-06-15 NOTE — Telephone Encounter (Signed)
Pt contacted. 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 Pt aware. Pt will keep 06/19/20 appointment with Dr Irene Limbo.

## 2020-06-15 NOTE — Progress Notes (Signed)
[facility] Social Work  Clinical Social Work was referred  to review and complete healthcare advance directives.  Chaplain and CSW met with patient in office.  The patient designated [name] as their primary healthcare agent and [name] as their secondary agent.  Patient also completed healthcare living will.    Clinical Social Worker notarized documents and made copies for patient/family. Clinical Social Worker will send documents to medical records to be scanned into patient's chart. Clinical Social Worker encouraged patient/family to contact with any additional questions or concerns.   [place] [phone]

## 2020-06-19 ENCOUNTER — Inpatient Hospital Stay: Payer: 59

## 2020-06-19 ENCOUNTER — Inpatient Hospital Stay: Payer: 59 | Admitting: Hematology

## 2020-06-20 ENCOUNTER — Ambulatory Visit: Payer: 59

## 2020-06-21 ENCOUNTER — Inpatient Hospital Stay: Payer: 59 | Attending: Hematology

## 2020-06-21 ENCOUNTER — Other Ambulatory Visit: Payer: Self-pay

## 2020-06-21 ENCOUNTER — Telehealth: Payer: Self-pay | Admitting: Hematology

## 2020-06-21 DIAGNOSIS — Z452 Encounter for adjustment and management of vascular access device: Secondary | ICD-10-CM | POA: Insufficient documentation

## 2020-06-21 DIAGNOSIS — C9000 Multiple myeloma not having achieved remission: Secondary | ICD-10-CM | POA: Insufficient documentation

## 2020-06-21 DIAGNOSIS — Z95828 Presence of other vascular implants and grafts: Secondary | ICD-10-CM

## 2020-06-21 DIAGNOSIS — C7951 Secondary malignant neoplasm of bone: Secondary | ICD-10-CM

## 2020-06-21 MED ORDER — HEPARIN SOD (PORK) LOCK FLUSH 100 UNIT/ML IV SOLN
500.0000 [IU] | Freq: Once | INTRAVENOUS | Status: AC
  Administered 2020-06-21: 500 [IU]
  Filled 2020-06-21: qty 5

## 2020-06-21 MED ORDER — SODIUM CHLORIDE 0.9% FLUSH
10.0000 mL | Freq: Once | INTRAVENOUS | Status: AC
Start: 2020-06-21 — End: 2020-06-21
  Administered 2020-06-21: 10 mL
  Filled 2020-06-21: qty 10

## 2020-06-21 NOTE — Telephone Encounter (Signed)
Scheduled appointment per 06/02 sch msg. Patient is aware.

## 2020-06-22 ENCOUNTER — Other Ambulatory Visit: Payer: Self-pay | Admitting: Hematology

## 2020-06-22 DIAGNOSIS — Z7189 Other specified counseling: Secondary | ICD-10-CM

## 2020-06-22 DIAGNOSIS — C9000 Multiple myeloma not having achieved remission: Secondary | ICD-10-CM

## 2020-06-25 ENCOUNTER — Ambulatory Visit: Payer: 59

## 2020-06-25 ENCOUNTER — Other Ambulatory Visit: Payer: 59

## 2020-06-26 ENCOUNTER — Ambulatory Visit: Payer: 59

## 2020-06-27 ENCOUNTER — Other Ambulatory Visit: Payer: Self-pay | Admitting: Hematology

## 2020-06-27 DIAGNOSIS — Z5111 Encounter for antineoplastic chemotherapy: Secondary | ICD-10-CM | POA: Insufficient documentation

## 2020-06-27 DIAGNOSIS — C9000 Multiple myeloma not having achieved remission: Secondary | ICD-10-CM

## 2020-06-27 DIAGNOSIS — Z7189 Other specified counseling: Secondary | ICD-10-CM

## 2020-06-28 NOTE — Telephone Encounter (Signed)
Per Dr. Irene Limbo as of last week 6/1, this medication was on hold. Gardiner Rhyme, RN

## 2020-07-02 ENCOUNTER — Ambulatory Visit: Payer: 59

## 2020-07-02 ENCOUNTER — Other Ambulatory Visit: Payer: 59

## 2020-07-03 ENCOUNTER — Ambulatory Visit: Payer: 59

## 2020-07-23 ENCOUNTER — Other Ambulatory Visit: Payer: Self-pay | Admitting: Nurse Practitioner

## 2020-07-23 DIAGNOSIS — E782 Mixed hyperlipidemia: Secondary | ICD-10-CM

## 2020-07-26 ENCOUNTER — Other Ambulatory Visit: Payer: Self-pay

## 2020-07-26 DIAGNOSIS — C9000 Multiple myeloma not having achieved remission: Secondary | ICD-10-CM

## 2020-07-31 ENCOUNTER — Other Ambulatory Visit: Payer: Self-pay

## 2020-07-31 DIAGNOSIS — C9000 Multiple myeloma not having achieved remission: Secondary | ICD-10-CM

## 2020-08-01 ENCOUNTER — Telehealth: Payer: Self-pay

## 2020-08-01 NOTE — Telephone Encounter (Signed)
Transition Care Management Follow-up Telephone Call Date of discharge and from where: 07/31/2020 [facility]  How have you been since you were released from the hospital? Pt says she feels okay, just really tired.  Any questions or concerns? No  Items Reviewed: Did the pt receive and understand the discharge instructions provided? Yes  Medications obtained and verified? Yes  Other? Yes  Any new allergies since your discharge? No  Dietary orders reviewed? Yes Do you have support at home? Yes   Home Care and Equipment/Supplies: Were home health services ordered? not applicable If so, what is the name of the agency? N/a   Has the agency set up a time to come to the patient's home? not applicable Were any new equipment or medical supplies ordered?  No What is the name of the medical supply agency? N/a  Were you able to get the supplies/equipment? not applicable Do you have any questions related to the use of the equipment or supplies? No  Functional Questionnaire: (I = Independent and D = Dependent) ADLs: i  Bathing/Dressing- I  Meal Prep- I  Eating- I  Maintaining continence- I  Transferring/Ambulation- I  Managing Meds- I  Follow up appointments reviewed:  PCP Hospital f/u appt confirmed? Yes  Scheduled to see Raman Ghumman  on 08/14/2020 @ 4:00pm. [facility] f/u appt confirmed? no Scheduled to see n/a  on n/a  @ n/a. Are transportation arrangements needed? No  If their condition worsens, is the pt aware to call PCP or go to the Emergency Dept.? Yes Was the patient provided with contact information for the PCP's office or ED? Yes Was to pt encouraged to call back with questions or concerns? Yes

## 2020-08-02 NOTE — Progress Notes (Signed)
HEMATOLOGY/ONCOLOGY CLNIC NOTE  Date of Service: 08/03/2020  Patient Care Team: [name], FNP as PCP - General (General Practice)  CHIEF COMPLAINTS/PURPOSE OF CONSULTATION:  Multiple myeloma- continued mx  HISTORY OF PRESENTING ILLNESS:   Alexandra [name] is a wonderful 55 y.o. female who has been referred to [place] by Dr. 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 The pt reports that she is doing well overall.   The pt reports that she was taking Benazepril-HCTZ for her HTN but was told to discontinue as she was becoming hypotensive while in the hospital. She continues taking Lipitor for her HLD and was previously on Ozempic for her Type 2 Diabetes. She has no significant family history of cancers or blood disorders. Pt has no known medication allergies. Pt had a hammer toe surgery and a tubal ligation. 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   In mid-June she began to notice a growth on the left side of her forehead. Because the lump continued to grow she visited her PCP at the end of June. Her PCP and Dermatologist thought that it was a lipoma initally. She was sent for a consult with Dr. Harlow Mares at [facility] in August. She received her first cranial MRI on 10/05/2019, which revealed a mass and bone erosion. Pt had her initial Craniectomy on 10/11/2019 and a Left Bifrontal Cranioplasty on 10/14/2019. Pt denies any new bone pain, fevers, chills, or night sweats at the time. Pt has lost 40 lbs over the last year, but this was after the addition of Ozempic and improved dietary and exercise habits. She has started regaining weight after holding Ozempic. At this time pt reports mild discomfort at surgical site and no other pertinent symptoms.  Of note prior to the patient's visit today, pt has had Surgical Pathology [phone]) completed on 10/10/2019 with results revealing "A.  BRAIN TUMOR, LEFT FRONTAL, RESECTION: -  Plasma cell neoplasm B. BRAIN TUMOR, LEFT FRONTAL, RESECTION: -  Plasma cell neoplasm C. BONE, LEFT FRONTAL, RESECTION: -  Plasma cell neoplasm."   Pt has had MRI Brain ([id number]) completed on 10/11/2019 with results revealing "No complicating feature or residual tumor seen after left frontal bone mass resection."  Pt has had MRI Brain ([id number]) completed on 10/05/2019 with results revealing "1. Large extra-axial mass in the anterior left frontal region with bone erosion and extension into the scalp soft tissues. Considerations include hemangiopericytoma, metastasis, and aggressive meningioma. 2. 6 mm enhancing lesion in the clivus. This is indeterminate but does raise concern for metastatic disease. 3. Mild chronic small vessel ischemic disease."  Most recent lab results (10/11/2019) of CBC & BMP is as follows: all values are WNL except for WBC at 16.6K, RBC at 2.99, Hgb at 9.1, HCT at 26.5, Glucose at 104, Calcium at 8.4.  On review of systems, pt reports discomfort at surgicalsite and denies balance issues, memory loss, headaches, fevers, chills, unexpected weight loss and any othe symptoms.   On PMHx the pt reports HTN, HLD, Diabetes, Tubal Ligation, Hammer Toe Surgery, Herpes. On Social Hx the pt reports that she previously smoked on and off but is not currently smoking and does not drink much alcohol. On Family Hx the pt reports a grandfather with prostate cancer.  INTERVAL HISTORY:  Alexandra [name] is a wonderful 55 y.o. 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 The pt reports that she is doing well overall. The pt last visit with [place] was on 05/21/2020.  The pt recently had a transplant at [facility]. DISEASE Lambda Light Chain Multiple Myeloma  - She will receive Melphalan 200 mg/m2 conditioning regimen, followed by autologous stem cell rescue (see details  below).  TRANSPLANT: Preparative regimen:  - Melphalan 200 mg/m2 (320m) IV: 07/09/20  Stem Cells:  - Autologous stem cell reinfusion on 07/10/20. Patient received 4.71 x 10^6 CD34+/kg.  The pt reports that she is recovering post-transplant. She recently received the Evusheld. She was feeling very tired and fatigued. The chemotherapy caused diverticulitis and diarrhea. She received a blood transfusions and potassium replacement due to this. She is eating at her baseline. She has not been set up for any re-evaluations or follow up visits. She is currently on Valacyclovir. The patient notes she was in the hospital from June 21 to July 12. She is at home by herself and is able to function independently.  Lab results today 08/03/2020 of CBC w/diff and CMP is as follows: all values are WNL except for RBC of 3.29, Hgb of 10.1, HCT of 29.7, Glucose of 118, Calcium of 8.3, Total protein of 6.2, Albumin of 3.0, AST of 42, ALT of 58, Total Bilirubin of <0.2. 08/03/2020 Magnesium of 1.6.   On review of systems, pt reports improving diarrhea, intermittent imbalance and denies infection issues, fevers, chills,  mouth sores, lung issues, SOB, chest pain, decreased appetite, and any other symptoms.   MEDICAL HISTORY:  Past Medical History:  Diagnosis Date   Anxiety    Hypertension    Pre-diabetes     SURGICAL HISTORY: Past Surgical History:  Procedure Laterality Date   ABLATION     APPLICATION OF CRANIAL NAVIGATION N/A 10/10/2019   Procedure: APPLICATION OF CRANIAL NAVIGATION;  Surgeon: NConsuella Lose MD;  [place]  Service: Neurosurgery;  Laterality: N/A;  APPLICATION OF CRANIAL NAVIGATION   CRANIOPLASTY Left 10/14/2019   Procedure: LEFT CRANIOPLASTY WITH PLACEMENT OF CUSTOM CRANIAL IMPLANT;  Surgeon: NConsuella Lose MD;  [place]  Service: Neurosurgery;  Laterality: Left;   CRANIOTOMY Left 10/10/2019   Procedure: Left frontal Stereotactic craniectomy for resection of tumor;   Surgeon: NConsuella Lose MD;  [place] [facility]  Service: Neurosurgery;  Laterality: Left;  Left frontal Stereotactic craniectomy for resection of tumor   HAMMER TOE SURGERY Left    IR IMAGING GUIDED PORT INSERTION  12/01/2019   TUBAL LIGATION      SOCIAL HISTORY: Social History   Socioeconomic History   Marital status: Single    Spouse name: Not on file   Number of children: Not on file   Years of education: Not on file   Highest education level: Not on file  Occupational History   Not on file  Tobacco Use   Smoking status: Never   Smokeless tobacco: Never  Vaping Use   Vaping Use: Never used  Substance and Sexual Activity   Alcohol use: Yes    Comment: occassionally   Drug use: Not Currently   Sexual activity: Not on file  Other Topics Concern   Not on file  Social History Narrative   Not on file   Social Determinants of Health   Financial Resource Strain: Not on file  Food Insecurity: Not on file  Transportation Needs: Not on file  Physical Activity: Not on file  Stress: Not on file  Social Connections: Not on file  Intimate Partner  Violence: Not on file    FAMILY HISTORY: Family History  Problem Relation Age of Onset   Breast cancer Maternal Grandmother    Heart disease Mother    Hypertension Mother    Sarcoidosis Father    Diabetes Maternal Grandfather     ALLERGIES:  has No Known Allergies.  MEDICATIONS:  Current Outpatient Medications  Medication Sig Dispense Refill   acetaminophen (TYLENOL) 650 MG CR tablet Take 1,300 mg by mouth every 8 (eight) hours as needed for pain.     amLODipine (NORVASC) 10 MG tablet Take 10 mg by mouth daily.     ondansetron (ZOFRAN) 8 MG tablet TAKE 1 TABLET BY MOUTH TWICE DAILY AS NEEDED FOR NAUSEA AND/OR VOMITING 30 tablet 1   Potassium Chloride ER 20 MEQ TBCR Take 1 tablet by mouth daily.     prochlorperazine (COMPAZINE) 10 MG tablet TAKE 1 TABLET BY MOUTH EVERY 6 HOURS AS NEEDED (NAUSEA OR VOMITING). 30 tablet 1    valACYclovir (VALTREX) 500 MG tablet Take 500 mg by mouth 2 (two) times daily.     aspirin EC 81 MG tablet Take 1 tablet (81 mg total) by mouth daily. (Patient not taking: Reported on 08/03/2020) 100 tablet 3   atorvastatin (LIPITOR) 10 MG tablet TAKE 1 TABLET BY MOUTH EVERY DAY (Patient not taking: Reported on 08/03/2020) 90 tablet 1   benazepril-hydrochlorthiazide (LOTENSIN HCT) 20-12.5 MG tablet Take 1 tablet by mouth daily. (Patient not taking: Reported on 08/03/2020) 90 tablet 1   Cholecalciferol (VITAMIN D) 50 MCG (2000 UT) tablet Take 6,000 Units by mouth daily. (Patient not taking: Reported on 08/03/2020)     ciprofloxacin (CIPRO) 500 MG tablet Take by mouth. (Patient not taking: Reported on 08/03/2020)     Coenzyme Q10 (COQ-10) 100 MG CAPS Take 200 mg by mouth daily. (Patient not taking: Reported on 08/03/2020)     dexamethasone (DECADRON) 4 MG tablet TAKE 10 TABLETS BY MOUTH ONCE ON DAY 22. REPEAT EVERY 28 DAYS FOR THE FIRST 4 CYCLES. TAKE WITH BREAKFAST (Patient not taking: Reported on 08/03/2020) 40 tablet 4   Insulin Pen Needle (NOVOFINE PLUS) 32G X 4 MM MISC Inject 1 pen into the skin daily. Inject 1 pen with Victoza SQ (Patient not taking: Reported on 08/03/2020) 100 each 3   lenalidomide (REVLIMID) 25 MG capsule TAKE 1 CAPSULE BY MOUTH EVERY DAY FOR 21 DAYS ON, THEN 7 DAYS OFF (Patient not taking: Reported on 08/03/2020) 21 capsule 0   levocetirizine (XYZAL) 5 MG tablet TAKE 1 TABLET BY MOUTH EVERY DAY IN THE EVENING (Patient not taking: Reported on 08/03/2020) 90 tablet 3   lidocaine-prilocaine (EMLA) cream APPLY TO AFFECTED AREA ONCE AS DIRECTED (Patient not taking: Reported on 08/03/2020) 30 g 3   LORazepam (ATIVAN) 0.5 MG tablet TAKE 1 TABLET BY MOUTH EVERY 6 HOURS AS NEEDED (NAUSEA OR VOMITING). (Patient not taking: Reported on 08/03/2020) 30 tablet 0   metroNIDAZOLE (FLAGYL) 500 MG tablet Take by mouth. (Patient not taking: Reported on 08/03/2020)     Multiple Vitamins-Minerals (IMMUNE  SUPPORT PO) Take 2 tablets by mouth daily. (Patient not taking: Reported on 08/03/2020)     Oxymetazoline HCl (AFRIN NODRIP SEVERE CONGEST NA) Place 1 spray into the nose daily as needed (congestion). (Patient not taking: Reported on 08/03/2020)     OZEMPIC, 0.25 OR 0.5 MG/DOSE, 2 MG/1.5ML SOPN INJECT 0.5 MG TOTAL INTO THE SKIN ONCE A WEEK. (Patient not taking: Reported on 08/03/2020) 4.5 mL 1   pantoprazole (PROTONIX) 40 MG  tablet Take 40 mg by mouth daily. (Patient not taking: Reported on 08/03/2020)     Semaglutide, 1 MG/DOSE, (OZEMPIC, 1 MG/DOSE,) 4 MG/3ML SOPN Inject 1 mg into the skin once a week. (Patient not taking: Reported on 08/03/2020) 9 mL 1   senna (SENOKOT) 8.6 MG TABS tablet Take 17.2 mg by mouth daily as needed for mild constipation. (Patient not taking: Reported on 08/03/2020)     traZODone (DESYREL) 50 MG tablet TAKE 1-2 TABLETS BY MOUTH AT BEDTIME AS NEEDED FOR SLEEP. (Patient not taking: Reported on 08/03/2020) 180 tablet 1   Turmeric Curcumin 500 MG CAPS Take 500 mg by mouth daily. (Patient not taking: Reported on 08/03/2020)     valACYclovir (VALTREX) 1000 MG tablet TAKE 1 TABLET BY MOUTH EVERY DAY AS NEEDED (Patient not taking: Reported on 08/03/2020) 90 tablet 1   vitamin B-12 (CYANOCOBALAMIN) 1000 MCG tablet Take 1,000 mcg by mouth daily. (Patient not taking: Reported on 08/03/2020)     No current facility-administered medications for this visit.    REVIEW OF SYSTEMS:   10 Point review of Systems was done is negative except as noted above.  PHYSICAL EXAMINATION: ECOG PERFORMANCE STATUS: 1 - Symptomatic but completely ambulatory  . Vitals:   08/03/20 1505  BP: 115/81  Pulse: 84  Resp: 18  Temp: 99.2 F (37.3 C)  SpO2: 100%    Filed Weights   08/03/20 1505  Weight: 171 lb 14.4 oz (78 kg)    .Body mass index is 27.41 kg/m.   GENERAL:alert, in no acute distress and comfortable SKIN: no acute rashes, no significant lesions EYES: conjunctiva are pink and  non-injected, sclera anicteric OROPHARYNX: MMM, no exudates, no oropharyngeal erythema or ulceration NECK: supple, no JVD LYMPH:  no palpable lymphadenopathy in the cervical, axillary or inguinal regions LUNGS: clear to auscultation b/l with normal respiratory effort HEART: regular rate & rhythm ABDOMEN:  normoactive bowel sounds , non tender, not distended. Extremity: no pedal edema PSYCH: alert & oriented x 3 with fluent speech NEURO: no focal motor/sensory deficits  LABORATORY DATA:  I have reviewed the data as listed  CBC Latest Ref Rng & Units 08/03/2020 06/04/2020 05/28/2020  WBC 4.0 - 10.5 K/uL 5.9 3.7(L) 4.5  Hemoglobin 12.0 - 15.0 g/dL 10.1(L) 10.7(L) 11.6(L)  Hematocrit 36.0 - 46.0 % 29.7(L) 31.4(L) 34.3(L)  Platelets 150 - 400 K/uL 391 154 181    CMP Latest Ref Rng & Units 08/03/2020 06/04/2020 05/28/2020  Glucose 70 - 99 mg/dL 118(H) 92 84  BUN 6 - 20 mg/dL 7 8 7   Creatinine 0.44 - 1.00 mg/dL 0.84 0.79 0.78  Sodium 135 - 145 mmol/L 139 142 142  Potassium 3.5 - 5.1 mmol/L 3.5 3.3(L) 3.4(L)  Chloride 98 - 111 mmol/L 108 106 103  CO2 22 - 32 mmol/L 23 29 28   Calcium 8.9 - 10.3 mg/dL 8.3(L) 9.0 8.7(L)  Total Protein 6.5 - 8.1 g/dL 6.2(L) 5.9(L) 6.2(L)  Total Bilirubin 0.3 - 1.2 mg/dL <0.2(L) 1.4(H) 1.3(H)  Alkaline Phos 38 - 126 U/L 86 42 48  AST 15 - 41 U/L 42(H) 16 13(L)  ALT 0 - 44 U/L 58(H) 12 11   11/08/2019 FISH Plasma Cell Myeloma Prognostic Panel:    11/08/2019 Cytogenetics:    11/08/2019 Bone Marrow Report [id number]):   10/10/2019 Surgical Pathology [phone]):   05/14/2020 Surgical Pathology "-Normocellular bone marrow for age with trilineage hematopoiesis and 1%  plasma cells  -See comment   PERIPHERAL BLOOD:  -Normocytic-normochromic anemia  -Leukopenia  COMMENT:   The bone marrow is generally normocellular for age with trilineage  hematopoiesis but with predominance of erythroid precursors.  The  myeloid changes are not considered  specific and likely related to  therapy.  In this background, the plasma cells represent 1% of all cells  associated with scattered small clusters and display slight lambda light  chain excess.  The findings are limited but most suggestive of minimal  residual plasma cell neoplasm.  Correlation with cytogenetic and FISH  studies is recommended. "  RADIOGRAPHIC STUDIES:  I have personally reviewed the radiological images as listed and agreed with the findings in the report. No results found.   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 No M spike. Lambda light chain myeloma + Anemia + bone lesion on skull-- resected No renal failure, hypercalcemia  BMBx- 22-30% lambda restricted plasma cell MolCy Dup (1q), del (13q)  PLAN: -Discussed pt labwork today, 08/03/2020; blood counts and chemistries stable. Magnesium borderline low but unconcerning.  Albumin lower and mild liver inflammation. -No blood transfusions or plt support needed at this time. -Recommended pt increase daily intake of magnesium (nuts, nut butters). -Recommended pt drink Boost or ensure to increase daily protein intake. Any high protein high calorie drinks will work. -Recommended pt contact [facility] ASAP regarding what preventive antibiotics she should be on. Pt needs to check if she needs to be on Bactrim. Emphasized importance of this. -Will connect with [facility] to see how they want to follow her and any visits. -Continue daily baby ASA, Valtrex, & B complex. -Advised pt she would need labs every 2 weeks for the next 6 weeks. -Will see back in 4 weeks with labs.   FOLLOW UP: Labs every 1 week with 1 unit of PRBC x 3 MD visit in 4 weeks Has appointment at [facility] in 1st week of august.   The total time spent in the appointment was 30 minutes and more than 50% was on  counseling and direct patient cares.   All of the patient's questions were answered with apparent satisfaction. The patient knows to call the clinic with any problems, questions or concerns.    Sullivan Lone MD [place] AAHIVMS [facility] Hematology/Oncology Physician [facility]  (Office):       [phone] (Work cell):  [phone] (Fax):           [phone]  08/03/2020 3:58 PM  I, Reinaldo Raddle, am acting as scribe for Dr. Sullivan Lone, MD.  .I have reviewed the above documentation for accuracy and completeness, and I agree with the above. Brunetta Genera MD

## 2020-08-03 ENCOUNTER — Inpatient Hospital Stay (HOSPITAL_BASED_OUTPATIENT_CLINIC_OR_DEPARTMENT_OTHER): Payer: 59 | Admitting: Hematology

## 2020-08-03 ENCOUNTER — Inpatient Hospital Stay: Payer: 59 | Attending: Hematology

## 2020-08-03 ENCOUNTER — Other Ambulatory Visit: Payer: Self-pay

## 2020-08-03 VITALS — BP 115/81 | HR 84 | Temp 99.2°F | Resp 18 | Ht 66.4 in | Wt 171.9 lb

## 2020-08-03 DIAGNOSIS — Z7982 Long term (current) use of aspirin: Secondary | ICD-10-CM | POA: Diagnosis not present

## 2020-08-03 DIAGNOSIS — D649 Anemia, unspecified: Secondary | ICD-10-CM | POA: Diagnosis not present

## 2020-08-03 DIAGNOSIS — Z9484 Stem cells transplant status: Secondary | ICD-10-CM | POA: Insufficient documentation

## 2020-08-03 DIAGNOSIS — C7951 Secondary malignant neoplasm of bone: Secondary | ICD-10-CM

## 2020-08-03 DIAGNOSIS — C9000 Multiple myeloma not having achieved remission: Secondary | ICD-10-CM | POA: Diagnosis not present

## 2020-08-03 LAB — CBC WITH DIFFERENTIAL (CANCER CENTER ONLY)
Abs Immature Granulocytes: 0.07 10*3/uL (ref 0.00–0.07)
Basophils Absolute: 0 10*3/uL (ref 0.0–0.1)
Basophils Relative: 1 %
Eosinophils Absolute: 0 10*3/uL (ref 0.0–0.5)
Eosinophils Relative: 0 %
HCT: 29.7 % — ABNORMAL LOW (ref 36.0–46.0)
Hemoglobin: 10.1 g/dL — ABNORMAL LOW (ref 12.0–15.0)
Immature Granulocytes: 1 %
Lymphocytes Relative: 39 %
Lymphs Abs: 2.3 10*3/uL (ref 0.7–4.0)
MCH: 30.7 pg (ref 26.0–34.0)
MCHC: 34 g/dL (ref 30.0–36.0)
MCV: 90.3 fL (ref 80.0–100.0)
Monocytes Absolute: 1 10*3/uL (ref 0.1–1.0)
Monocytes Relative: 17 %
Neutro Abs: 2.5 10*3/uL (ref 1.7–7.7)
Neutrophils Relative %: 42 %
Platelet Count: 391 10*3/uL (ref 150–400)
RBC: 3.29 MIL/uL — ABNORMAL LOW (ref 3.87–5.11)
RDW: 14.2 % (ref 11.5–15.5)
WBC Count: 5.9 10*3/uL (ref 4.0–10.5)
nRBC: 0 % (ref 0.0–0.2)

## 2020-08-03 LAB — CMP (CANCER CENTER ONLY)
ALT: 58 U/L — ABNORMAL HIGH (ref 0–44)
AST: 42 U/L — ABNORMAL HIGH (ref 15–41)
Albumin: 3 g/dL — ABNORMAL LOW (ref 3.5–5.0)
Alkaline Phosphatase: 86 U/L (ref 38–126)
Anion gap: 8 (ref 5–15)
BUN: 7 mg/dL (ref 6–20)
CO2: 23 mmol/L (ref 22–32)
Calcium: 8.3 mg/dL — ABNORMAL LOW (ref 8.9–10.3)
Chloride: 108 mmol/L (ref 98–111)
Creatinine: 0.84 mg/dL (ref 0.44–1.00)
GFR, Estimated: >60 mL/min (ref >60)
Glucose, Bld: 118 mg/dL — ABNORMAL HIGH (ref 70–99)
Potassium: 3.5 mmol/L (ref 3.5–5.1)
Sodium: 139 mmol/L (ref 135–145)
Total Bilirubin: <0.2 mg/dL — ABNORMAL LOW (ref 0.3–1.2)
Total Protein: 6.2 g/dL — ABNORMAL LOW (ref 6.5–8.1)

## 2020-08-03 LAB — MAGNESIUM: Magnesium: 1.6 mg/dL — ABNORMAL LOW (ref 1.7–2.4)

## 2020-08-06 ENCOUNTER — Telehealth: Payer: Self-pay | Admitting: Hematology

## 2020-08-06 LAB — KAPPA/LAMBDA LIGHT CHAINS
Kappa free light chain: 18 mg/L (ref 3.3–19.4)
Kappa, lambda light chain ratio: 2.28 — ABNORMAL HIGH (ref 0.26–1.65)
Lambda free light chains: 7.9 mg/L (ref 5.7–26.3)

## 2020-08-06 NOTE — Telephone Encounter (Signed)
Scheduled follow-up appointments per 7/15 los. Patient is aware. 

## 2020-08-08 LAB — MULTIPLE MYELOMA PANEL, SERUM
Albumin SerPl Elph-Mcnc: 3.4 g/dL (ref 2.9–4.4)
Albumin/Glob SerPl: 1.6 (ref 0.7–1.7)
Alpha 1: 0.3 g/dL (ref 0.0–0.4)
Alpha2 Glob SerPl Elph-Mcnc: 0.7 g/dL (ref 0.4–1.0)
B-Globulin SerPl Elph-Mcnc: 0.7 g/dL (ref 0.7–1.3)
Gamma Glob SerPl Elph-Mcnc: 0.5 g/dL (ref 0.4–1.8)
Globulin, Total: 2.2 g/dL (ref 2.2–3.9)
IgA: 61 mg/dL — ABNORMAL LOW (ref 87–352)
IgG (Immunoglobin G), Serum: 707 mg/dL (ref 586–1602)
IgM (Immunoglobulin M), Srm: 24 mg/dL — ABNORMAL LOW (ref 26–217)
Total Protein ELP: 5.6 g/dL — ABNORMAL LOW (ref 6.0–8.5)

## 2020-08-10 ENCOUNTER — Inpatient Hospital Stay: Payer: 59

## 2020-08-10 ENCOUNTER — Other Ambulatory Visit: Payer: Self-pay

## 2020-08-10 ENCOUNTER — Encounter: Payer: Self-pay | Admitting: Hematology

## 2020-08-10 DIAGNOSIS — C9000 Multiple myeloma not having achieved remission: Secondary | ICD-10-CM

## 2020-08-10 LAB — CBC WITH DIFFERENTIAL (CANCER CENTER ONLY)
Abs Immature Granulocytes: 0.02 10*3/uL (ref 0.00–0.07)
Basophils Absolute: 0.1 10*3/uL (ref 0.0–0.1)
Basophils Relative: 1 %
Eosinophils Absolute: 0.1 10*3/uL (ref 0.0–0.5)
Eosinophils Relative: 2 %
HCT: 29.3 % — ABNORMAL LOW (ref 36.0–46.0)
Hemoglobin: 10.2 g/dL — ABNORMAL LOW (ref 12.0–15.0)
Immature Granulocytes: 0 %
Lymphocytes Relative: 28 %
Lymphs Abs: 1.4 10*3/uL (ref 0.7–4.0)
MCH: 31.4 pg (ref 26.0–34.0)
MCHC: 34.8 g/dL (ref 30.0–36.0)
MCV: 90.2 fL (ref 80.0–100.0)
Monocytes Absolute: 0.7 10*3/uL (ref 0.1–1.0)
Monocytes Relative: 14 %
Neutro Abs: 2.6 10*3/uL (ref 1.7–7.7)
Neutrophils Relative %: 55 %
Platelet Count: 399 10*3/uL (ref 150–400)
RBC: 3.25 MIL/uL — ABNORMAL LOW (ref 3.87–5.11)
RDW: 15.1 % (ref 11.5–15.5)
WBC Count: 4.9 10*3/uL (ref 4.0–10.5)
nRBC: 0 % (ref 0.0–0.2)

## 2020-08-10 LAB — CMP (CANCER CENTER ONLY)
ALT: 30 U/L (ref 0–44)
AST: 25 U/L (ref 15–41)
Albumin: 3.6 g/dL (ref 3.5–5.0)
Alkaline Phosphatase: 50 U/L (ref 38–126)
Anion gap: 9 (ref 5–15)
BUN: 14 mg/dL (ref 6–20)
CO2: 28 mmol/L (ref 22–32)
Calcium: 9.3 mg/dL (ref 8.9–10.3)
Chloride: 106 mmol/L (ref 98–111)
Creatinine: 0.84 mg/dL (ref 0.44–1.00)
GFR, Estimated: >60 mL/min (ref >60)
Glucose, Bld: 127 mg/dL — ABNORMAL HIGH (ref 70–99)
Potassium: 3.8 mmol/L (ref 3.5–5.1)
Sodium: 143 mmol/L (ref 135–145)
Total Bilirubin: 0.5 mg/dL (ref 0.3–1.2)
Total Protein: 6.3 g/dL — ABNORMAL LOW (ref 6.5–8.1)

## 2020-08-10 LAB — SAMPLE TO BLOOD BANK

## 2020-08-10 LAB — MAGNESIUM: Magnesium: 1.9 mg/dL (ref 1.7–2.4)

## 2020-08-14 ENCOUNTER — Ambulatory Visit (INDEPENDENT_AMBULATORY_CARE_PROVIDER_SITE_OTHER): Payer: 59 | Admitting: Nurse Practitioner

## 2020-08-14 ENCOUNTER — Encounter: Payer: Self-pay | Admitting: Nurse Practitioner

## 2020-08-14 VITALS — Wt 171.0 lb

## 2020-08-14 DIAGNOSIS — C9000 Multiple myeloma not having achieved remission: Secondary | ICD-10-CM

## 2020-08-14 DIAGNOSIS — Z09 Encounter for follow-up examination after completed treatment for conditions other than malignant neoplasm: Secondary | ICD-10-CM | POA: Diagnosis not present

## 2020-08-14 NOTE — Progress Notes (Signed)
Virtual Visit via Virtual 100% Video    This visit type was conducted due to national recommendations for restrictions regarding the COVID-19 Pandemic (e.g. social distancing) in an effort to limit this patient's exposure and mitigate transmission in our community.  Due to her co-morbid illnesses, this patient is at least at moderate risk for complications without adequate follow up.  This format is felt to be most appropriate for this patient at this time.  All issues noted in this document were discussed and addressed.  A limited physical exam was performed with this format.    This visit type was conducted due to national recommendations for restrictions regarding the COVID-19 Pandemic (e.g. social distancing) in an effort to limit this patient's exposure and mitigate transmission in our community.  Patients identity confirmed using two different identifiers.  This format is felt to be most appropriate for this patient at this time.  All issues noted in this document were discussed and addressed.  No physical exam was performed (except for noted visual exam findings with Video Visits).    Date:  08/14/2020   ID:  [name], [name] October 26, 1965, MRN [id number]  Patient Location:  Home   Provider location:   Office  Chief Complaint:  Hospital follow up   History of Present Illness:    Alexandra [name] is a 55 y.o. female who presents via video conferencing for a telehealth visit today.  No  The patient does not have symptoms concerning for COVID-19 infection (fever, chills, cough, or new shortness of breath).   Patient is her for a follow up June 21st transplant June 21 st - July 12. She had some diverticulis in the hospital and her BP was up so they changed her BP meds to amlodipine 10 mg. She is not taking the Benazepril anymore. She is not taking atrovstatin and ozempic. She is taking potassium 20MEQ daily, antiviral valcycoloiver BID 500 mg. She is also taking nausea  medications. Pantoprazole once daily. She sees the specialist Aug 3rd. Every Friday she has labs at the cancer center and if she needs anything she can get it here instead of [facility]. She is restless at night. She takes trazodone as needed. She is eating so she does have a good appetite. She is at home. She does not have help at home. But she has help from her daughter. However her daughter currently does not stay with her. She has some concern with her legs swelling at times but they have gotten better this week.     Past Medical History:  Diagnosis Date   Anxiety    Hypertension    Pre-diabetes    Past Surgical History:  Procedure Laterality Date   ABLATION     APPLICATION OF CRANIAL NAVIGATION N/A 10/10/2019   Procedure: APPLICATION OF CRANIAL NAVIGATION;  Surgeon: Consuella Lose, MD;  Location: [place];  Service: Neurosurgery;  Laterality: N/A;  APPLICATION OF CRANIAL NAVIGATION   CRANIOPLASTY Left 10/14/2019   Procedure: LEFT CRANIOPLASTY WITH PLACEMENT OF CUSTOM CRANIAL IMPLANT;  Surgeon: Consuella Lose, MD;  Location: [place];  Service: Neurosurgery;  Laterality: Left;   CRANIOTOMY Left 10/10/2019   Procedure: Left frontal Stereotactic craniectomy for resection of tumor;  Surgeon: Consuella Lose, MD;  Location: [place];  Service: Neurosurgery;  Laterality: Left;  Left frontal Stereotactic craniectomy for resection of tumor   HAMMER TOE SURGERY Left    IR IMAGING GUIDED PORT INSERTION  12/01/2019   TUBAL LIGATION  Current Meds  Medication Sig   amLODipine (NORVASC) 10 MG tablet Take 10 mg by mouth daily.   ondansetron (ZOFRAN) 8 MG tablet TAKE 1 TABLET BY MOUTH TWICE DAILY AS NEEDED FOR NAUSEA AND/OR VOMITING   Potassium Chloride ER 20 MEQ TBCR Take 1 tablet by mouth daily.   prochlorperazine (COMPAZINE) 10 MG tablet TAKE 1 TABLET BY MOUTH EVERY 6 HOURS AS NEEDED (NAUSEA OR VOMITING).   valACYclovir (VALTREX) 500 MG tablet Take 500 mg by mouth 2 (two) times daily.      Allergies:   Patient has no known allergies.   Social History   Tobacco Use   Smoking status: Never   Smokeless tobacco: Never  Vaping Use   Vaping Use: Never used  Substance Use Topics   Alcohol use: Yes    Comment: occassionally   Drug use: Not Currently     Family Hx: The patient's family history includes Breast cancer in her maternal grandmother; Diabetes in her maternal grandfather; Heart disease in her mother; Hypertension in her mother; Sarcoidosis in her father.  ROS:   Please see the history of present illness.    Review of Systems  Constitutional:  Positive for malaise/fatigue. Negative for chills and fever.  HENT:  Negative for congestion.   Respiratory:  Negative for cough.   Cardiovascular:  Positive for leg swelling. Negative for chest pain and palpitations.       Reports some leg swelling last week. But has gotten better.   Gastrointestinal:  Negative for abdominal pain, constipation, nausea and vomiting.  Musculoskeletal:  Negative for back pain and myalgias.  Neurological:  Negative for weakness and headaches.   All other systems reviewed and are negative.   Labs/Other Tests and Data Reviewed:    Recent Labs: 08/10/2020: ALT 30; BUN 14; Creatinine 0.84; Hemoglobin 10.2; Magnesium 1.9; Platelet Count 399; Potassium 3.8; Sodium 143   Recent Lipid Panel Lab Results  Component Value Date/Time   CHOL 140 02/15/2020 11:25 AM   TRIG 74 02/15/2020 11:25 AM   HDL 63 02/15/2020 11:25 AM   CHOLHDL 2.2 02/15/2020 11:25 AM   LDLCALC 62 02/15/2020 11:25 AM    Wt Readings from Last 3 Encounters:  08/14/20 171 lb (77.6 kg)  08/03/20 171 lb 14.4 oz (78 kg)  06/04/20 175 lb (79.4 kg)     Exam:    Vital Signs:  Wt 171 lb (77.6 kg)   LMP 07/09/2015   BMI 27.27 kg/m     Physical Exam Vitals and nursing note reviewed.  HENT:     Head: Normocephalic and atraumatic.  Pulmonary:     Effort: Pulmonary effort is normal.  Neurological:     Mental Status:  She is alert and oriented to person, place, and time.  Psychiatric:        Mood and Affect: Affect normal.    ASSESSMENT & PLAN:    Hospital discharge follow-up Patient is here for a follow up on her hospital stay from 07/10/20 to 07/31/20 She was admitted for post transplant care. She did fairly well in the hospital but did develop neutropenic fever and UTI- E-coli that resolved with treatment with antx. CT also showed diverticulitis which was treated with bowel rest and pain medication as needed. She also received antx. Currently she is at home and doing well. She uses anti-emetics for nausea. She also states that she was put on amlodipine for her BP in the hospital due to it being high. She was taken off her benazepril,  Lipitor and ozempic. She was discharged from the hospital with antx, pantoprazole and potassium, Zofran, compazine, valtrex. She takes trazodone at night as needed for sleep and tylenol for headache. She goes to the cancer center every Friday for lab work and sees the specialist at [facility] on Aug[facility] 3rd. Otherwise doing well. She did complain little bit about leg swelling however that has gotten better. Advised patient to elevate her legs while sitting and laying down. She has a good appetite. Drinking fluids. Denies GU/GI issues.   Multiple myeloma, remission status unspecified (HCC) Stable. Continue to follow up with oncologist and [facility] and also get lab work every Friday at the cancer center.   COVID-19 Education: The signs and symptoms of COVID-19 were discussed with the patient and how to seek care for testing (follow up with PCP or arrange E-visit).  The importance of social distancing was discussed today.  Patient Risk:   After full review of this patients clinical status, I feel that they are at least moderate risk at this time.  Time:   Today, I have spent 15 minutes/ seconds with the patient with telehealth technology discussing above diagnoses.     Medication  Adjustments/Labs and Tests Ordered: Current medicines are reviewed at length with the patient today.  Concerns regarding medicines are outlined above.   Tests Ordered: No orders of the defined types were placed in this encounter.   Medication Changes: No orders of the defined types were placed in this encounter.   Disposition:  Follow up prn  Signed, Bary Castilla, NP

## 2020-08-17 ENCOUNTER — Other Ambulatory Visit: Payer: Self-pay

## 2020-08-17 ENCOUNTER — Inpatient Hospital Stay: Payer: 59

## 2020-08-17 DIAGNOSIS — C9000 Multiple myeloma not having achieved remission: Secondary | ICD-10-CM

## 2020-08-17 LAB — CMP (CANCER CENTER ONLY)
ALT: 31 U/L (ref 0–44)
AST: 25 U/L (ref 15–41)
Albumin: 3.6 g/dL (ref 3.5–5.0)
Alkaline Phosphatase: 52 U/L (ref 38–126)
Anion gap: 10 (ref 5–15)
BUN: 11 mg/dL (ref 6–20)
CO2: 24 mmol/L (ref 22–32)
Calcium: 9.4 mg/dL (ref 8.9–10.3)
Chloride: 110 mmol/L (ref 98–111)
Creatinine: 0.84 mg/dL (ref 0.44–1.00)
GFR, Estimated: >60 mL/min (ref >60)
Glucose, Bld: 120 mg/dL — ABNORMAL HIGH (ref 70–99)
Potassium: 4.1 mmol/L (ref 3.5–5.1)
Sodium: 144 mmol/L (ref 135–145)
Total Bilirubin: 0.4 mg/dL (ref 0.3–1.2)
Total Protein: 6.4 g/dL — ABNORMAL LOW (ref 6.5–8.1)

## 2020-08-17 LAB — CBC WITH DIFFERENTIAL (CANCER CENTER ONLY)
Abs Immature Granulocytes: 0.02 10*3/uL (ref 0.00–0.07)
Basophils Absolute: 0.1 10*3/uL (ref 0.0–0.1)
Basophils Relative: 1 %
Eosinophils Absolute: 0.4 10*3/uL (ref 0.0–0.5)
Eosinophils Relative: 7 %
HCT: 31 % — ABNORMAL LOW (ref 36.0–46.0)
Hemoglobin: 10.7 g/dL — ABNORMAL LOW (ref 12.0–15.0)
Immature Granulocytes: 0 %
Lymphocytes Relative: 25 %
Lymphs Abs: 1.3 10*3/uL (ref 0.7–4.0)
MCH: 31.3 pg (ref 26.0–34.0)
MCHC: 34.5 g/dL (ref 30.0–36.0)
MCV: 90.6 fL (ref 80.0–100.0)
Monocytes Absolute: 0.6 10*3/uL (ref 0.1–1.0)
Monocytes Relative: 12 %
Neutro Abs: 2.9 10*3/uL (ref 1.7–7.7)
Neutrophils Relative %: 55 %
Platelet Count: 235 10*3/uL (ref 150–400)
RBC: 3.42 MIL/uL — ABNORMAL LOW (ref 3.87–5.11)
RDW: 15.5 % (ref 11.5–15.5)
WBC Count: 5.3 10*3/uL (ref 4.0–10.5)
nRBC: 0 % (ref 0.0–0.2)

## 2020-08-17 LAB — MAGNESIUM: Magnesium: 1.8 mg/dL (ref 1.7–2.4)

## 2020-08-17 NOTE — Progress Notes (Signed)
Labs from today faxed to [facility] per their request.

## 2020-08-22 NOTE — Progress Notes (Signed)
Lab appointment and blood appointment canceled for 08/24/20. Pt seen at [facility] today per their report all labs WNL. Per [facility] no need for transfusion this week. [facility] summary of today's visit being faxed to this office. Will send for scan.

## 2020-08-24 ENCOUNTER — Inpatient Hospital Stay: Payer: 59

## 2020-08-31 ENCOUNTER — Ambulatory Visit: Payer: 59 | Admitting: Hematology

## 2020-09-02 ENCOUNTER — Encounter (HOSPITAL_COMMUNITY): Payer: Self-pay

## 2020-09-02 ENCOUNTER — Other Ambulatory Visit: Payer: Self-pay

## 2020-09-02 ENCOUNTER — Emergency Department (HOSPITAL_COMMUNITY): Payer: 59

## 2020-09-02 ENCOUNTER — Emergency Department (HOSPITAL_COMMUNITY)
Admission: EM | Admit: 2020-09-02 | Discharge: 2020-09-02 | Disposition: A | Discharge status: Home / Self Care | Payer: 59 | Attending: Emergency Medicine | Admitting: Emergency Medicine

## 2020-09-02 DIAGNOSIS — R1010 Upper abdominal pain, unspecified: Secondary | ICD-10-CM | POA: Diagnosis present

## 2020-09-02 DIAGNOSIS — I10 Essential (primary) hypertension: Secondary | ICD-10-CM | POA: Insufficient documentation

## 2020-09-02 DIAGNOSIS — K5792 Diverticulitis of intestine, part unspecified, without perforation or abscess without bleeding: Secondary | ICD-10-CM | POA: Diagnosis not present

## 2020-09-02 DIAGNOSIS — Z79899 Other long term (current) drug therapy: Secondary | ICD-10-CM | POA: Insufficient documentation

## 2020-09-02 DIAGNOSIS — Z20822 Contact with and (suspected) exposure to covid-19: Secondary | ICD-10-CM | POA: Diagnosis not present

## 2020-09-02 DIAGNOSIS — Z85830 Personal history of malignant neoplasm of bone: Secondary | ICD-10-CM | POA: Diagnosis not present

## 2020-09-02 LAB — CBC WITH DIFFERENTIAL/PLATELET
Abs Immature Granulocytes: 0.03 10*3/uL (ref 0.00–0.07)
Basophils Absolute: 0 10*3/uL (ref 0.0–0.1)
Basophils Relative: 0 %
Eosinophils Absolute: 0.3 10*3/uL (ref 0.0–0.5)
Eosinophils Relative: 3 %
HCT: 34.6 % — ABNORMAL LOW (ref 36.0–46.0)
Hemoglobin: 11.9 g/dL — ABNORMAL LOW (ref 12.0–15.0)
Immature Granulocytes: 0 %
Lymphocytes Relative: 22 %
Lymphs Abs: 2.7 10*3/uL (ref 0.7–4.0)
MCH: 31.3 pg (ref 26.0–34.0)
MCHC: 34.4 g/dL (ref 30.0–36.0)
MCV: 91.1 fL (ref 80.0–100.0)
Monocytes Absolute: 1.2 10*3/uL — ABNORMAL HIGH (ref 0.1–1.0)
Monocytes Relative: 10 %
Neutro Abs: 8 10*3/uL — ABNORMAL HIGH (ref 1.7–7.7)
Neutrophils Relative %: 65 %
Platelets: 251 10*3/uL (ref 150–400)
RBC: 3.8 MIL/uL — ABNORMAL LOW (ref 3.87–5.11)
RDW: 15.2 % (ref 11.5–15.5)
WBC: 12.3 10*3/uL — ABNORMAL HIGH (ref 4.0–10.5)
nRBC: 0 % (ref 0.0–0.2)

## 2020-09-02 LAB — URINALYSIS, ROUTINE W REFLEX MICROSCOPIC
Bilirubin Urine: NEGATIVE
Glucose, UA: NEGATIVE mg/dL
Hgb urine dipstick: NEGATIVE
Ketones, ur: NEGATIVE mg/dL
Leukocytes,Ua: NEGATIVE
Nitrite: NEGATIVE
Protein, ur: NEGATIVE mg/dL
Specific Gravity, Urine: 1.013 (ref 1.005–1.030)
pH: 6 (ref 5.0–8.0)

## 2020-09-02 LAB — LIPASE, BLOOD: Lipase: 24 U/L (ref 11–51)

## 2020-09-02 LAB — COMPREHENSIVE METABOLIC PANEL WITH GFR
ALT: 26 U/L (ref 0–44)
AST: 25 U/L (ref 15–41)
Albumin: 4.2 g/dL (ref 3.5–5.0)
Alkaline Phosphatase: 59 U/L (ref 38–126)
Anion gap: 10 (ref 5–15)
BUN: 9 mg/dL (ref 6–20)
CO2: 23 mmol/L (ref 22–32)
Calcium: 9.3 mg/dL (ref 8.9–10.3)
Chloride: 104 mmol/L (ref 98–111)
Creatinine, Ser: 0.69 mg/dL (ref 0.44–1.00)
GFR, Estimated: >60 mL/min
Glucose, Bld: 109 mg/dL — ABNORMAL HIGH (ref 70–99)
Potassium: 3.7 mmol/L (ref 3.5–5.1)
Sodium: 137 mmol/L (ref 135–145)
Total Bilirubin: 1 mg/dL (ref 0.3–1.2)
Total Protein: 7.2 g/dL (ref 6.5–8.1)

## 2020-09-02 LAB — LACTIC ACID, PLASMA: Lactic Acid, Venous: 0.7 mmol/L (ref 0.5–1.9)

## 2020-09-02 LAB — APTT: aPTT: 35 s (ref 24–36)

## 2020-09-02 LAB — RESP PANEL BY RT-PCR (FLU A&B, COVID) ARPGX2
Influenza A by PCR: NEGATIVE
Influenza B by PCR: NEGATIVE
SARS Coronavirus 2 by RT PCR: NEGATIVE

## 2020-09-02 LAB — PROTIME-INR
INR: 1 (ref 0.8–1.2)
Prothrombin Time: 13.4 seconds (ref 11.4–15.2)

## 2020-09-02 MED ORDER — IOHEXOL 350 MG/ML SOLN
80.0000 mL | Freq: Once | INTRAVENOUS | Status: AC | PRN
  Administered 2020-09-02: 80 mL via INTRAVENOUS

## 2020-09-02 MED ORDER — AMOXICILLIN-POT CLAVULANATE 875-125 MG PO TABS: 1.0000 | ORAL_TABLET | Freq: Two times a day (BID) | ORAL | 0 refills | Status: DC

## 2020-09-02 MED ORDER — ACETAMINOPHEN 325 MG PO TABS
650.0000 mg | ORAL_TABLET | Freq: Once | ORAL | Status: AC
  Administered 2020-09-02: 650 mg via ORAL
  Filled 2020-09-02: qty 2

## 2020-09-02 MED ORDER — MORPHINE SULFATE (PF) 4 MG/ML IV SOLN
4.0000 mg | Freq: Once | INTRAVENOUS | Status: AC
  Administered 2020-09-02: 4 mg via INTRAVENOUS
  Filled 2020-09-02: qty 1

## 2020-09-02 MED ORDER — OXYCODONE-ACETAMINOPHEN 5-325 MG PO TABS: 1.0000 | ORAL_TABLET | Freq: Four times a day (QID) | ORAL | 0 refills | Status: DC | PRN

## 2020-09-02 NOTE — ED Notes (Signed)
PA-C at the bedside.  ?

## 2020-09-02 NOTE — ED Triage Notes (Signed)
Pt presents to the ED for epigastric abd pain which she states began yesterday. She reports a known hx of diverticulitis. Pt reports accompanying sx of headache and chills. Pt denies any SOB, CP, N/V/D, dysuria, fever, cough, or sore throat. 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 Pt's last chemo treatment was July 09, 2020 for multiple myeloma.

## 2020-09-02 NOTE — Discharge Instructions (Addendum)
Call your primary care doctor or GI specialist as discussed in the next 2-3 days.   Return immediately back to the ER if:  Your symptoms worsen within the next 12-24 hours. You develop new symptoms such as new fevers, persistent vomiting, new pain, shortness of breath, or new weakness or numbness, or if you have any other concerns.

## 2020-09-02 NOTE — ED Provider Notes (Signed)
Emergency Medicine Provider Triage Evaluation Note  Alexandra [name] , a 55 y.o. female  was evaluated in triage.  Pt complains of epigastric pain.  Patient states her symptoms began yesterday and have been progressively worsening.  Reports headache as well as chills.  No chest pain, shortness of breath, nausea, vomiting, diarrhea, urinary complaints.  No URI symptoms.  Patient has history of multiple myeloma last received chemotherapy as well as a bone marrow transplant in June of this year.  Physical Exam  BP 126/79 (BP Location: Left Arm)   Pulse 86   Temp (!) 102.2 F (39 C) (Oral)   Resp 18   Ht _0  (1.702 m)   Wt 79.4 kg   LMP 07/09/2015   SpO2 95%   BMI 27.41 kg/m  Gen:   Awake, no distress   Resp:  Normal effort  MSK:   Moves extremities without difficulty  Other:    Medical Decision Making  Medically screening exam initiated at 5:18 PM.  Appropriate orders placed.  Roosvelt Maser Amburn was informed that the remainder of the evaluation will be completed by another provider, this initial triage assessment does not replace that evaluation, and the importance of remaining in the ED until their evaluation is complete.  Patient incidentally found to be febrile.  We will obtain a sepsis work-up.   Rayna Sexton, PA-C 09/02/20 1721    Luna Fuse, MD 09/02/20 [phone]

## 2020-09-02 NOTE — ED Provider Notes (Signed)
[facility] DEPT Provider Note   CSN: [id number] Arrival date & time: 09/02/20  1652     History Chief Complaint  Patient presents with   Abdominal Pain    Alexandra [name] is a 55 y.o. female.  Patient presenting with mid to upper abdominal pain.  Described as sharp and severe.  Ongoing since yesterday.  Does not radiate elsewhere.  Developed a fever today.  No reports of vomiting or cough or diarrhea.  She states the pain feels similar to when she had diverticulitis diagnosed with pain in the mid abdominal to epigastric region.      Past Medical History:  Diagnosis Date   Anxiety    Hypertension    Pre-diabetes     Patient Active Problem List   Diagnosis Date Noted   Port-A-Cath in place 01/30/2020   Multiple myeloma ([place]) 11/28/2019   Counseling regarding advance care planning and goals of care 11/28/2019   Bone metastasis ([place]) 11/28/2019   Brain tumor ([place]) 10/10/2019   Mixed hyperlipidemia 03/23/2018   Groin pain, right 02/10/2018   Elevated cholesterol 02/10/2018   Right lower quadrant abdominal pain 02/10/2018   Essential hypertension 10/23/2017   Prediabetes 10/23/2017   Insomnia 10/23/2017    Past Surgical History:  Procedure Laterality Date   ABLATION     APPLICATION OF CRANIAL NAVIGATION N/A 10/10/2019   Procedure: APPLICATION OF CRANIAL NAVIGATION;  Surgeon: Consuella Lose, MD;  Location: [place];  Service: Neurosurgery;  Laterality: N/A;  APPLICATION OF CRANIAL NAVIGATION   CRANIOPLASTY Left 10/14/2019   Procedure: LEFT CRANIOPLASTY WITH PLACEMENT OF CUSTOM CRANIAL IMPLANT;  Surgeon: Consuella Lose, MD;  Location: [place];  Service: Neurosurgery;  Laterality: Left;   CRANIOTOMY Left 10/10/2019   Procedure: Left frontal Stereotactic craniectomy for resection of tumor;  Surgeon: Consuella Lose, MD;  Location: [place];  Service: Neurosurgery;  Laterality: Left;  Left frontal Stereotactic craniectomy for resection of  tumor   HAMMER TOE SURGERY Left    IR IMAGING GUIDED PORT INSERTION  12/01/2019   TUBAL LIGATION       OB History   No obstetric history on file.     Family History  Problem Relation Age of Onset   Breast cancer Maternal Grandmother    Heart disease Mother    Hypertension Mother    Sarcoidosis Father    Diabetes Maternal Grandfather     Social History   Tobacco Use   Smoking status: Never   Smokeless tobacco: Never  Vaping Use   Vaping Use: Never used  Substance Use Topics   Alcohol use: Yes    Comment: occassionally   Drug use: Not Currently    Home Medications Prior to Admission medications   Medication Sig Start Date End Date Taking? Authorizing Provider  amoxicillin-clavulanate (AUGMENTIN) 875-125 MG tablet Take 1 tablet by mouth every 12 (twelve) hours. 09/02/20  Yes Laikyn Gewirtz, Greggory Brandy, MD  oxyCODONE-acetaminophen (PERCOCET/ROXICET) 5-325 MG tablet Take 1 tablet by mouth every 6 (six) hours as needed for up to 8 doses for severe pain. 09/02/20  Yes Luna Fuse, MD  amLODipine (NORVASC) 10 MG tablet Take 10 mg by mouth daily. 07/31/20   [provider]  atorvastatin (LIPITOR) 10 MG tablet TAKE 1 TABLET BY MOUTH EVERY DAY Patient not taking: No sig reported 07/24/20   [name], FNP  benazepril-hydrochlorthiazide (LOTENSIN HCT) 20-12.5 MG tablet Take 1 tablet by mouth daily. Patient not taking: No sig reported 05/03/20   [name], [place]  Cholecalciferol (VITAMIN D) 50 MCG (2000 UT) tablet Take 6,000 Units by mouth daily. Patient not taking: No sig reported    [provider]  Coenzyme Q10 (COQ-10) 100 MG CAPS Take 200 mg by mouth daily. Patient not taking: No sig reported    [provider]  dexamethasone (DECADRON) 4 MG tablet TAKE 10 TABLETS BY MOUTH ONCE ON DAY 22. REPEAT EVERY 28 DAYS FOR THE FIRST 4 CYCLES. TAKE WITH BREAKFAST Patient not taking: No sig reported 11/28/19 11/27/20  Brunetta Genera, MD  Insulin Pen Needle (NOVOFINE  PLUS) 32G X 4 MM MISC Inject 1 pen into the skin daily. Inject 1 pen with Victoza SQ Patient not taking: No sig reported 10/23/17   [name], FNP  lenalidomide (REVLIMID) 25 MG capsule TAKE 1 CAPSULE BY MOUTH EVERY DAY FOR 21 DAYS ON, THEN 7 DAYS OFF Patient not taking: No sig reported 05/31/20   Brunetta Genera, MD  levocetirizine (XYZAL) 5 MG tablet TAKE 1 TABLET BY MOUTH EVERY DAY IN THE EVENING Patient not taking: No sig reported 04/02/20   [name], FNP  lidocaine-prilocaine (EMLA) cream APPLY TO AFFECTED AREA ONCE AS DIRECTED Patient not taking: No sig reported 11/28/19 11/27/20  Brunetta Genera, MD  Multiple Vitamins-Minerals (IMMUNE SUPPORT PO) Take 2 tablets by mouth daily. Patient not taking: No sig reported    [provider]  ondansetron (ZOFRAN) 8 MG tablet TAKE 1 TABLET BY MOUTH TWICE DAILY AS NEEDED FOR NAUSEA AND/OR VOMITING 11/28/19 11/27/20  Brunetta Genera, MD  Oxymetazoline HCl (AFRIN NODRIP SEVERE CONGEST NA) Place 1 spray into the nose daily as needed (congestion). Patient not taking: No sig reported    [provider]  OZEMPIC, 0.25 OR 0.5 MG/DOSE, 2 MG/1.5ML SOPN INJECT 0.5 MG TOTAL INTO THE SKIN ONCE A WEEK. Patient not taking: No sig reported 06/11/20   [name], FNP  pantoprazole (PROTONIX) 40 MG tablet Take 40 mg by mouth daily. Patient not taking: No sig reported 07/31/20   [provider]  Potassium Chloride ER 20 MEQ TBCR Take 1 tablet by mouth daily. 07/31/20   [provider]  prochlorperazine (COMPAZINE) 10 MG tablet TAKE 1 TABLET BY MOUTH EVERY 6 HOURS AS NEEDED (NAUSEA OR VOMITING). 11/28/19 11/27/20  Brunetta Genera, MD  Semaglutide, 1 MG/DOSE, (OZEMPIC, 1 MG/DOSE,) 4 MG/3ML SOPN Inject 1 mg into the skin once a week. Patient not taking: No sig reported 05/03/20   [name], FNP  senna (SENOKOT) 8.6 MG TABS tablet Take 17.2 mg by mouth daily as needed for mild constipation. Patient not taking: No  sig reported    [provider]  traZODone (DESYREL) 50 MG tablet TAKE 1-2 TABLETS BY MOUTH AT BEDTIME AS NEEDED FOR SLEEP. Patient not taking: No sig reported 03/21/20   Brunetta Genera, MD  Turmeric Curcumin 500 MG CAPS Take 500 mg by mouth daily. Patient not taking: No sig reported    [provider]  valACYclovir (VALTREX) 1000 MG tablet TAKE 1 TABLET BY MOUTH EVERY DAY AS NEEDED Patient not taking: No sig reported 03/26/20   [name], FNP  valACYclovir (VALTREX) 500 MG tablet Take 500 mg by mouth 2 (two) times daily. 07/06/20   [provider]  vitamin B-12 (CYANOCOBALAMIN) 1000 MCG tablet Take 1,000 mcg by mouth daily. Patient not taking: No sig reported    [provider]    Allergies    Patient has no known allergies.  Review of Systems  Review of Systems  Constitutional:  Negative for fever.  HENT:  Negative for ear pain.   Eyes:  Negative for pain.  Respiratory:  Negative for cough.   Cardiovascular:  Negative for chest pain.  Gastrointestinal:  Positive for abdominal pain.  Genitourinary:  Negative for flank pain.  Musculoskeletal:  Negative for back pain.  Skin:  Negative for rash.  Neurological:  Negative for headaches.   Physical Exam Updated Vital Signs BP 104/68   Pulse 67   Temp 99.8 F (37.7 C) (Oral)   Resp 19   Ht 5' 7"  (1.702 m)   Wt 79.4 kg   LMP 07/09/2015   SpO2 100%   BMI 27.41 kg/m   Physical Exam Constitutional:      General: She is not in acute distress.    Appearance: Normal appearance.  HENT:     Head: Normocephalic.     Nose: Nose normal.  Eyes:     Extraocular Movements: Extraocular movements intact.  Cardiovascular:     Rate and Rhythm: Normal rate.  Pulmonary:     Effort: Pulmonary effort is normal.  Abdominal:     Tenderness: There is abdominal tenderness in the epigastric area.  Musculoskeletal:        General: Normal range of motion.     Cervical back: Normal range of motion.   Neurological:     General: No focal deficit present.     Mental Status: She is alert. Mental status is at baseline.    ED Results / Procedures / Treatments   Labs (all labs ordered are listed, but only abnormal results are displayed) Labs Reviewed  COMPREHENSIVE METABOLIC PANEL - Abnormal; Notable for the following components:      Result Value   Glucose, Bld 109 (*)    All other components within normal limits  CBC WITH DIFFERENTIAL/PLATELET - Abnormal; Notable for the following components:   WBC 12.3 (*)    RBC 3.80 (*)    Hemoglobin 11.9 (*)    HCT 34.6 (*)    Neutro Abs 8.0 (*)    Monocytes Absolute 1.2 (*)    All other components within normal limits  URINALYSIS, ROUTINE W REFLEX MICROSCOPIC - Abnormal; Notable for the following components:   Color, Urine STRAW (*)    All other components within normal limits  RESP PANEL BY RT-PCR (FLU A&B, COVID) ARPGX2  CULTURE, BLOOD (SINGLE)  URINE CULTURE  LIPASE, BLOOD  LACTIC ACID, PLASMA  PROTIME-INR  APTT  I-STAT BETA HCG BLOOD, ED ([facility], [facility], [facility] ONLY)    EKG EKG Interpretation  Date/Time:  Sunday September 02 2020 17:52:19 EDT Ventricular Rate:  77 PR Interval:  141 QRS Duration: 73 QT Interval:  357 QTC Calculation: 404 R Axis:   43 Text Interpretation: Sinus rhythm Nonspecific T abnormalities, lateral leads Confirmed by Thamas Jaegers ([id number]) on 09/02/2020 5:56:00 PM  Radiology CT Abdomen Pelvis W Contrast  Result Date: 09/02/2020 CLINICAL DATA:  Epigastric pain x1 day. Patient has history of multiple myeloma with bone marrow transplant in June of 2022. EXAM: CT ABDOMEN AND PELVIS WITH CONTRAST TECHNIQUE: Multidetector CT imaging of the abdomen and pelvis was performed using the standard protocol following bolus administration of intravenous contrast. CONTRAST:  75m OMNIPAQUE IOHEXOL 350 MG/ML SOLN COMPARISON:  None. FINDINGS: Lower chest: No acute abnormality. Hepatobiliary: No focal liver abnormality is seen. No  gallstones, gallbladder wall thickening, or biliary dilatation. Pancreas: Unremarkable. No pancreatic ductal dilatation or surrounding inflammatory changes. Spleen: Normal in size without  focal abnormality. Adrenals/Urinary Tract: Adrenal glands are unremarkable. Kidneys are normal, without renal calculi, focal lesion, or hydronephrosis. Bladder is unremarkable. Stomach/Bowel: Stomach is within normal limits. Appendix appears normal. No evidence of bowel dilatation. A short segment of moderate to markedly thickened and inflamed large bowel is seen at the hepatic flexure. Numerous diverticula are noted throughout the large bowel. Vascular/Lymphatic: No significant vascular findings are present. No enlarged abdominal or pelvic lymph nodes. Reproductive: Uterus is unremarkable. Bilateral tubal ligation clips are seen. Other: No abdominal wall hernia or abnormality. No abdominopelvic ascites or free air. Musculoskeletal: No acute or significant osseous findings. IMPRESSION: Moderate to marked severity focal colitis and/or diverticulitis along the hepatic flexure. Electronically Signed   By: Virgina Norfolk M.D.   On: 09/02/2020 19:23   DG Chest Port 1 View  Result Date: 09/02/2020 CLINICAL DATA:  Questionable sepsis. Epigastric pain. Headache and chills. EXAM: PORTABLE CHEST 1 VIEW COMPARISON:  None. FINDINGS: The heart size and mediastinal contours are within normal limits. Both lungs are clear. The visualized skeletal structures are unremarkable. IMPRESSION: No active disease. Electronically Signed   By: Dorise Bullion III M.D.   On: 09/02/2020 18:03    Procedures Procedures   Medications Ordered in ED Medications  acetaminophen (TYLENOL) tablet 650 mg (650 mg Oral Given 09/02/20 1811)  morphine 4 MG/ML injection 4 mg (4 mg Intravenous Given 09/02/20 1817)  iohexol (OMNIPAQUE) 350 MG/ML injection 80 mL (80 mLs Intravenous Contrast Given 09/02/20 1909)    ED Course  I have reviewed the triage vital  signs and the nursing notes.  Pertinent labs & imaging results that were available during my care of the patient were reviewed by me and considered in my medical decision making (see chart for details).    MDM Rules/Calculators/A&P                           Labs show mild leukocytosis.  CT of the pelvis concerning for colitis/diverticulitis.  Patient given a prescription of Augmentin and pain medication.  Advise follow-up with GI physician or primary care doctor within 2 to 3 days.  Advised immediate return for worsening pain fevers or any additional concerns.  Final Clinical Impression(s) / ED Diagnoses Final diagnoses:  Diverticulitis    Rx / DC Orders ED Discharge Orders          Ordered    amoxicillin-clavulanate (AUGMENTIN) 875-125 MG tablet  Every 12 hours        09/02/20 2016    oxyCODONE-acetaminophen (PERCOCET/ROXICET) 5-325 MG tablet  Every 6 hours PRN        09/02/20 2016             Luna Fuse, MD 09/02/20 2016

## 2020-09-02 NOTE — ED Notes (Signed)
Pt ambulated to restroom and states that she will attempt to obtain a urine.

## 2020-09-03 LAB — I-STAT BETA HCG BLOOD, ED (MC, WL, AP ONLY): I-stat hCG, quantitative: <5 m[IU]/mL (ref <5)

## 2020-09-04 LAB — URINE CULTURE

## 2020-09-05 ENCOUNTER — Encounter: Payer: Self-pay | Admitting: Nurse Practitioner

## 2020-09-05 ENCOUNTER — Telehealth (INDEPENDENT_AMBULATORY_CARE_PROVIDER_SITE_OTHER): Payer: 59 | Admitting: Nurse Practitioner

## 2020-09-05 ENCOUNTER — Other Ambulatory Visit: Payer: Self-pay

## 2020-09-05 VITALS — Ht 67.0 in | Wt 172.0 lb

## 2020-09-05 DIAGNOSIS — K5792 Diverticulitis of intestine, part unspecified, without perforation or abscess without bleeding: Secondary | ICD-10-CM

## 2020-09-05 DIAGNOSIS — I10 Essential (primary) hypertension: Secondary | ICD-10-CM | POA: Diagnosis not present

## 2020-09-05 DIAGNOSIS — R0989 Other specified symptoms and signs involving the circulatory and respiratory systems: Secondary | ICD-10-CM

## 2020-09-05 NOTE — Progress Notes (Signed)
Virtual Visit via MyChart   This visit type was conducted due to national recommendations for restrictions regarding the COVID-19 Pandemic (e.g. social distancing) in an effort to limit this patient's exposure and mitigate transmission in our community.  Due to her co-morbid illnesses, this patient is at least at moderate risk for complications without adequate follow up.  This format is felt to be most appropriate for this patient at this time.  All issues noted in this document were discussed and addressed.  A limited physical exam was performed with this format.    This visit type was conducted due to national recommendations for restrictions regarding the COVID-19 Pandemic (e.g. social distancing) in an effort to limit this patient's exposure and mitigate transmission in our community.  Patients identity confirmed using two different identifiers.  This format is felt to be most appropriate for this patient at this time.  All issues noted in this document were discussed and addressed.  No physical exam was performed (except for noted visual exam findings with Video Visits).    Date:  09/05/2020   ID:  Alexandra, [name] 1965-06-16, MRN [id number]  Patient Location:  Home - spoke with [name]  Provider location:   Office    Chief Complaint:  ER follow up for diverticulitis  History of Present Illness:    Alexandra [name] is a 55 y.o. female who presents via video conferencing for a telehealth visit today.    The patient does not have symptoms concerning for COVID-19 infection (fever, chills, cough, or new shortness of breath).   She is having a ER follow up after having a flare of diverticulitis. She seen Dr. Collene Mares yesterday. She is taking Augmentin. She had the shingles vaccine yesterday. She would like a flu vaccine and will ask Dr. Rex Kras when she should get this done.    Her next follow up with her transplant provider is on Friday. She went to went to [facility] in  July. She continues to isolate herself and limits exposure to others. She will need to get all her vaccines.  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 She has been doing well. Her platelets are improved since having her transplant. She is to go back to [facility] Sept 15th. 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  Blood pressure medications was changed to amlodipine and was discontinued by the transplant team  She is not taking any medications for prediabetes. She is walking 2 miles a day.     Past Medical History:  Diagnosis Date   Anxiety    Hypertension    Pre-diabetes    Past Surgical History:  Procedure Laterality Date   ABLATION     APPLICATION OF CRANIAL NAVIGATION N/A 10/10/2019   Procedure: APPLICATION OF CRANIAL NAVIGATION;  Surgeon: Consuella Lose, MD;  Location: [place];  Service: Neurosurgery;  Laterality: N/A;  APPLICATION OF CRANIAL NAVIGATION   CRANIOPLASTY Left 10/14/2019   Procedure: LEFT CRANIOPLASTY WITH PLACEMENT OF CUSTOM CRANIAL IMPLANT;  Surgeon: Consuella Lose, MD;  Location: [place];  Service: Neurosurgery;  Laterality: Left;   CRANIOTOMY Left 10/10/2019   Procedure: 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   amoxicillin-clavulanate (AUGMENTIN) 875-125 MG tablet Take 1 tablet by mouth every 12 (twelve) hours.   docusate sodium (COLACE) 100 MG capsule Take 100 mg by mouth 2 (two) times daily.   oxyCODONE-acetaminophen (PERCOCET/ROXICET) 5-325 MG tablet Take 1 tablet by mouth every 6 (six) hours as needed for up to 8 doses  for severe pain.   valACYclovir (VALTREX) 500 MG tablet Take 500 mg by mouth 2 (two) times daily.     Allergies:   Patient has no known allergies.   Social History   Tobacco Use   Smoking status: Never   Smokeless tobacco: Never  Vaping Use   Vaping Use: Never used  Substance Use Topics   Alcohol use: Yes    Comment: occassionally   Drug use: Not Currently     Family Hx: The patient's family history includes Breast cancer in her maternal grandmother; Diabetes in her maternal grandfather; Heart disease in her mother; Hypertension in her mother; Sarcoidosis in her father.  ROS:   Please see the history of present illness.    Review of Systems  Constitutional: Negative.   HENT:  Positive for sore throat (she has a sensation of "something" being stuck at the back of her tongue).   Respiratory: Negative.    Cardiovascular: Negative.   Genitourinary: Negative.   Musculoskeletal: Negative.   Neurological: Negative.   Psychiatric/Behavioral: Negative.     All other systems reviewed and are negative.   Labs/Other Tests and Data Reviewed:    Recent Labs: 08/17/2020: Magnesium 1.8 09/02/2020: ALT 26; BUN 9; Creatinine, Ser 0.69; Hemoglobin 11.9; Platelets 251; Potassium 3.7; Sodium 137   Recent Lipid Panel Lab Results  Component Value Date/Time   CHOL 140 02/15/2020 11:25 AM   TRIG 74 02/15/2020 11:25 AM   HDL 63 02/15/2020 11:25 AM   CHOLHDL 2.2 02/15/2020 11:25 AM   LDLCALC 62 02/15/2020 11:25 AM    Wt Readings from Last 3 Encounters:  09/05/20 172 lb (78 kg)  09/02/20 175 lb (79.4 kg)  08/14/20 171 lb (77.6 kg)     Exam:    Vital Signs:  Ht '5\' 7"'$  (1.702 m)   Wt 172 lb (78 kg)   LMP 07/09/2015   BMI 26.94 kg/m     Physical Exam Constitutional:      General: She is not in acute distress.    Appearance: Normal appearance.  Pulmonary:     Effort: Pulmonary effort is normal. No respiratory distress.  Neurological:     General: No focal deficit present.      Mental Status: She is alert and oriented to person, place, and time. Mental status is at baseline.     Cranial Nerves: No cranial nerve deficit.  Psychiatric:        Mood and Affect: Mood and affect normal.        Behavior: Behavior normal.        Thought Content: Thought content normal.        Cognition and Memory: Memory normal.        Judgment: Judgment normal.    ASSESSMENT & PLAN:     There are no diagnoses linked to this encounter.   COVID-19 Education: The signs and symptoms of COVID-19 were discussed with the patient and how to seek care for testing (follow up with PCP or arrange E-visit).  The importance of social distancing was discussed today.  Patient Risk:   After full review of this patients clinical status, I feel that they are at least moderate risk at this time.  Time:   Today, I have spent 15.30 minutes/ seconds with the patient with telehealth technology discussing above diagnoses.     Medication Adjustments/Labs and Tests Ordered: Current medicines are reviewed at length with the patient today.  Concerns regarding medicines are outlined above.   Tests Ordered: Orders Placed This Encounter  Procedures   Ambulatory referral to ENT    Medication Changes: No orders of the defined types were placed in this encounter.   Disposition:  Follow up prn  Signed, [name], FNP

## 2020-09-05 NOTE — Patient Instructions (Signed)

## 2020-09-07 ENCOUNTER — Inpatient Hospital Stay: Payer: 59 | Attending: Hematology

## 2020-09-07 ENCOUNTER — Inpatient Hospital Stay (HOSPITAL_BASED_OUTPATIENT_CLINIC_OR_DEPARTMENT_OTHER): Payer: 59 | Admitting: Hematology

## 2020-09-07 ENCOUNTER — Other Ambulatory Visit: Payer: Self-pay

## 2020-09-07 ENCOUNTER — Inpatient Hospital Stay: Payer: 59

## 2020-09-07 VITALS — BP 111/75 | HR 66 | Temp 98.1°F | Resp 18 | Ht 67.0 in | Wt 178.8 lb

## 2020-09-07 DIAGNOSIS — C9000 Multiple myeloma not having achieved remission: Secondary | ICD-10-CM | POA: Insufficient documentation

## 2020-09-07 DIAGNOSIS — Z9481 Bone marrow transplant status: Secondary | ICD-10-CM | POA: Diagnosis not present

## 2020-09-07 DIAGNOSIS — C7951 Secondary malignant neoplasm of bone: Secondary | ICD-10-CM

## 2020-09-07 LAB — CBC WITH DIFFERENTIAL/PLATELET
Abs Immature Granulocytes: 0 10*3/uL (ref 0.00–0.07)
Basophils Absolute: 0 10*3/uL (ref 0.0–0.1)
Basophils Relative: 1 %
Eosinophils Absolute: 0.4 10*3/uL (ref 0.0–0.5)
Eosinophils Relative: 9 %
HCT: 31.7 % — ABNORMAL LOW (ref 36.0–46.0)
Hemoglobin: 10.9 g/dL — ABNORMAL LOW (ref 12.0–15.0)
Immature Granulocytes: 0 %
Lymphocytes Relative: 28 %
Lymphs Abs: 1.2 10*3/uL (ref 0.7–4.0)
MCH: 30.5 pg (ref 26.0–34.0)
MCHC: 34.4 g/dL (ref 30.0–36.0)
MCV: 88.8 fL (ref 80.0–100.0)
Monocytes Absolute: 0.4 10*3/uL (ref 0.1–1.0)
Monocytes Relative: 10 %
Neutro Abs: 2.2 10*3/uL (ref 1.7–7.7)
Neutrophils Relative %: 52 %
Platelets: 252 10*3/uL (ref 150–400)
RBC: 3.57 MIL/uL — ABNORMAL LOW (ref 3.87–5.11)
RDW: 14.6 % (ref 11.5–15.5)
WBC: 4.2 10*3/uL (ref 4.0–10.5)
nRBC: 0 % (ref 0.0–0.2)

## 2020-09-07 LAB — CMP (CANCER CENTER ONLY)
ALT: 15 U/L (ref 0–44)
AST: 16 U/L (ref 15–41)
Albumin: 3.5 g/dL (ref 3.5–5.0)
Alkaline Phosphatase: 54 U/L (ref 38–126)
Anion gap: 11 (ref 5–15)
BUN: 12 mg/dL (ref 6–20)
CO2: 21 mmol/L — ABNORMAL LOW (ref 22–32)
Calcium: 9 mg/dL (ref 8.9–10.3)
Chloride: 110 mmol/L (ref 98–111)
Creatinine: 0.75 mg/dL (ref 0.44–1.00)
GFR, Estimated: >60 mL/min (ref >60)
Glucose, Bld: 141 mg/dL — ABNORMAL HIGH (ref 70–99)
Potassium: 3.6 mmol/L (ref 3.5–5.1)
Sodium: 142 mmol/L (ref 135–145)
Total Bilirubin: 0.4 mg/dL (ref 0.3–1.2)
Total Protein: 6.5 g/dL (ref 6.5–8.1)

## 2020-09-07 LAB — CULTURE, BLOOD (SINGLE)
Culture: NO GROWTH
Special Requests: ADEQUATE

## 2020-09-07 LAB — MAGNESIUM: Magnesium: 1.9 mg/dL (ref 1.7–2.4)

## 2020-09-07 LAB — SAMPLE TO BLOOD BANK

## 2020-09-10 LAB — KAPPA/LAMBDA LIGHT CHAINS
Kappa free light chain: 10.8 mg/L (ref 3.3–19.4)
Kappa, lambda light chain ratio: 1.74 — ABNORMAL HIGH (ref 0.26–1.65)
Lambda free light chains: 6.2 mg/L (ref 5.7–26.3)

## 2020-09-11 LAB — MULTIPLE MYELOMA PANEL, SERUM
Albumin SerPl Elph-Mcnc: 3.5 g/dL (ref 2.9–4.4)
Albumin/Glob SerPl: 1.5 (ref 0.7–1.7)
Alpha 1: 0.3 g/dL (ref 0.0–0.4)
Alpha2 Glob SerPl Elph-Mcnc: 0.7 g/dL (ref 0.4–1.0)
B-Globulin SerPl Elph-Mcnc: 1 g/dL (ref 0.7–1.3)
Gamma Glob SerPl Elph-Mcnc: 0.6 g/dL (ref 0.4–1.8)
Globulin, Total: 2.5 g/dL (ref 2.2–3.9)
IgA: 46 mg/dL — ABNORMAL LOW (ref 87–352)
IgG (Immunoglobin G), Serum: 698 mg/dL (ref 586–1602)
IgM (Immunoglobulin M), Srm: 9 mg/dL — ABNORMAL LOW (ref 26–217)
Total Protein ELP: 6 g/dL (ref 6.0–8.5)

## 2020-09-13 ENCOUNTER — Encounter: Payer: Self-pay | Admitting: Hematology

## 2020-09-13 NOTE — Progress Notes (Signed)
HEMATOLOGY/ONCOLOGY CLNIC NOTE  Date of Service: .09/07/2020   Patient Care Team: [name], FNP as PCP - General (General Practice)  CHIEF COMPLAINTS/PURPOSE OF CONSULTATION:  Multiple myeloma- continued mx  HISTORY OF PRESENTING ILLNESS:   Alexandra [name] is a wonderful 55 y.o. female who has been referred to [place] by Dr. 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 The pt reports that she is doing well overall.   The pt reports that she was taking Benazepril-HCTZ for her HTN but was told to discontinue as she was becoming hypotensive while in the hospital. She continues taking Lipitor for her HLD and was previously on Ozempic for her Type 2 Diabetes. She has no significant family history of cancers or blood disorders. Pt has no known medication allergies. Pt had a hammer toe surgery and a tubal ligation. 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   In mid-June she began to notice a growth on the left side of her forehead. Because the lump continued to grow she visited her PCP at the end of June. Her PCP and Dermatologist thought that it was a lipoma initally. She was sent for a consult with Dr. Harlow Mares at [facility] in August. She received her first cranial MRI on 10/05/2019, which revealed a mass and bone erosion. Pt had her initial Craniectomy on 10/11/2019 and a Left Bifrontal Cranioplasty on 10/14/2019. Pt denies any new bone pain, fevers, chills, or night sweats at the time. Pt has lost 40 lbs over the last year, but this was after the addition of Ozempic and improved dietary and exercise habits. She has started regaining weight after holding Ozempic. At this time pt reports mild discomfort at surgical site and no other pertinent symptoms.  Of note prior to the patient's visit today, pt has had Surgical Pathology [phone]) completed on 10/10/2019 with results revealing  "A. BRAIN TUMOR, LEFT FRONTAL, RESECTION: -  Plasma cell neoplasm B. BRAIN TUMOR, LEFT FRONTAL, RESECTION: -  Plasma cell neoplasm C. BONE, LEFT FRONTAL, RESECTION: -  Plasma cell neoplasm."   Pt has had MRI Brain ([id number]) completed on 10/11/2019 with results revealing "No complicating feature or residual tumor seen after left frontal bone mass resection."  Pt has had MRI Brain ([id number]) completed on 10/05/2019 with results revealing "1. Large extra-axial mass in the anterior left frontal region with bone erosion and extension into the scalp soft tissues. Considerations include hemangiopericytoma, metastasis, and aggressive meningioma. 2. 6 mm enhancing lesion in the clivus. This is indeterminate but does raise concern for metastatic disease. 3. Mild chronic small vessel ischemic disease."  Most recent lab results (10/11/2019) of CBC & BMP is as follows: all values are WNL except for WBC at 16.6K, RBC at 2.99, Hgb at 9.1, HCT at 26.5, Glucose at 104, Calcium at 8.4.  On review of systems, pt reports discomfort at surgicalsite and denies balance issues, memory loss, headaches, fevers, chills, unexpected weight loss and any othe symptoms.   On PMHx the pt reports HTN, HLD, Diabetes, Tubal Ligation, Hammer Toe Surgery, Herpes. On Social Hx the pt reports that she previously smoked on and off but is not currently smoking and does not drink much alcohol. On Family Hx the pt reports a grandfather with prostate cancer.    INTERVAL HISTORY:  Alexandra [name] is a wonderful 55 y.o. 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  She is currently nearly at day 60 post transplant. Notes no acute issues since her last visit with [place] about a month ago.  Labs today show stable blood counts with mild anemia normal platelets and WBC counts and no transfusion requirements at this time.  Kappa and lambda light chains within  normal limits.  She had her post transplant follow-up at [facility] on 08/22/2020. She notes that she has a follow-up with Dr. Alvie Heidelberg around day 100 for repeat PET scan bone marrow biopsy for post transplant disease specific evaluation and for recommendations regarding consolidation on maintenance treatment.  No fevers no chills no night sweats.  No mouth sores.  No abdominal pain.  No diarrhea.  Appetite is improving. She notes that she is getting more physically active. 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  I did recommend that she be particularly cautious and exposure in close places to multiple crowds in the absence of completion of her posttransplant vaccinations would be considered high risk exposure.   ONCOLOGIC HISTORY   Lambda Light Chain Multiple Myeloma  A. 09/27/19: CT Maxillofacial enhancing dural-based anterior cranial fossa mass traversing the calvarium and extending into the extracalvarial soft tissues along with a 7.4 mm rightward midline shift at the level of anterior cranial fossa. MRI head with and without contrast is recommended for further evaluation. B. 10/05/19: MRI Head large extra-axial mass anterior left frontal region with bone erosion and extension into the scalp soft tissues. Considerations include hemangiopericytoma, metastasis, and aggressive hemangioma. 2.6 mm enhancing lesion in the clivus.  C. 10/10/19: Left craniectomy with frontal brain tumor/bone resection, path + plasma cell neoplasm, lambda restricted, plasmacytoma favored. Hgb 9.9,  D. 10/31/19: Initial Hem-Onc consultation; M-spike 0; IgG 627, IgQ 51, IgM 11; SFLC: Kappa 1.12 mg/dL, Lambda 154.50, ratio 0.01; IFE: serum + monoclonal free Lambda light chain; Hgb 10.1,  E. 11/04/19: 24 hour UPEP Total protein 1318, Free lambda light chains Ur 1332.46, ratio 0.02, M-spike 27.4,  M-spike 24 hr at 361; Bence Jones positive, Lambda type F. 11/08/19: BMBx 22% atypical plasma cells, 30% by IHC; FISH Dup(1q) and Del(13q) detected G. 11/10/19: PET CT no metabolic activity within the skeleton to localize active myeloma; no lytic lesions identified; no abnormal activity at the left craniectomy site; no evidence of soft tissue plasmacytoma. H. 12/05/19: Started KRD; SFLC: Kappa 1.42, Lambda 177.97, ratio 0.01; Hgb 11.0 I. 12//13/21: SFLC: Kappa 1.45, Lambda 8.92, ratio 0.16; Hgb 11.1 J. 02/06/20: SFLC: Kappa 1.10, Lambda 2.08, ratio 0.53; Hgb 11.3 K. 02/26/20: Cycle 4 of KRD; SFLC: Kappa 1.14, Lambda 1.90, ratio 0.60; Hgb 10.8 L. 04/02/20: SFLC: Kappa 1.05, Lambda 1.56, ratio 0.67; Hgb 11.2 M. 04/30/20: SFLC: Kappa 1.01, Lambda 1.40, ratio 0.72; Hgb 11.5  N. 05/05/20: MRI Brain no evidence of left frontal bone lesion recurrence; Multiple subcentimeter area of nodular enhancement in the calvarium are nonspecific but could related to myeloma given nonvisualization on prior. O. 05/11/20: PET CT no findings to suggest FDG avid lesions of multiple myeloma. P. 05/14/20: BMBx 1% plasma cells Q. 05/28/20: SFLC: Kappa 0.84, Lambda 1.16, ratio 0.72; Hgb 11.6  R. 06/11/20: EKG QT 408/QTc 424; PFT's: FEV1 110%, DLCO 58.8% predicted, 64.4% hgb corrected; ECHO: EF 55% est, 61% calc; GLS -20%; SFLC: Kappa 1.16, Lambda 1.40, ratio 0.83; Hgb 10.9-VGPR S. 07/06/20: SFLC: Kappa 1.16, Lambda 2.14,  ratio 0.54; . Hgb 11.7, Plt 118 T. 6/20-7/12/22: Hospital admission for Melphalan 200 mg/m2 followed by reinfusion of 4.71 x 10^6/kg CD34+ autologous cells MEDICAL HISTORY:  Past Medical History:  Diagnosis Date   Anxiety    Hypertension    Pre-diabetes     SURGICAL HISTORY: Past Surgical History:  Procedure Laterality Date   ABLATION     APPLICATION OF CRANIAL NAVIGATION N/A 10/10/2019   Procedure: APPLICATION OF CRANIAL NAVIGATION;  Surgeon: Consuella Lose, MD;  Location: [place];  Service: Neurosurgery;   Laterality: N/A;  APPLICATION OF CRANIAL NAVIGATION   CRANIOPLASTY Left 10/14/2019   Procedure: LEFT CRANIOPLASTY WITH PLACEMENT OF CUSTOM CRANIAL IMPLANT;  Surgeon: Consuella Lose, MD;  Location: [place];  Service: Neurosurgery;  Laterality: Left;   CRANIOTOMY Left 10/10/2019   Procedure: Left frontal Stereotactic craniectomy for resection of tumor;  Surgeon: Consuella Lose, MD;  Location: [place];  Service: Neurosurgery;  Laterality: Left;  Left frontal Stereotactic craniectomy for resection of tumor   HAMMER TOE SURGERY Left    IR IMAGING GUIDED PORT INSERTION  12/01/2019   TUBAL LIGATION      SOCIAL HISTORY: Social History   Socioeconomic History   Marital status: Single    Spouse name: Not on file   Number of children: Not on file   Years of education: Not on file   Highest education level: 12th grade  Occupational History   Not on file  Tobacco Use   Smoking status: Never   Smokeless tobacco: Never  Vaping Use   Vaping Use: Never used  Substance and Sexual Activity   Alcohol use: Yes    Comment: occassionally   Drug use: Not Currently   Sexual activity: Not on file  Other Topics Concern   Not on file  Social History Narrative   Not on file   Social Determinants of Health   Financial Resource Strain: Medium Risk   Difficulty of Paying Living Expenses: Somewhat hard  Food Insecurity: Food Insecurity Present   Worried About Charity fundraiser in the Last Year: Sometimes true   Arboriculturist in the Last Year: Never true  Transportation Needs: Unmet Transportation Needs   Lack of Transportation (Medical): No   Lack of Transportation (Non-Medical): Yes  Physical Activity: Sufficiently Active   Days of Exercise per Week: 5 days   Minutes of Exercise per Session: 30 min  Stress: No Stress Concern Present   Feeling of Stress : Only a little  Social Connections: Moderately Integrated   Frequency of Communication with Friends and Family: More than three times a  week   Frequency of Social Gatherings with Friends and Family: Once a week   Attends Religious Services: More than 4 times per year   Active Member of [employer] or Organizations: Yes   Attends Archivist Meetings: 1 to 4 times per year   Marital Status: Divorced  Human resources officer Violence: Not on file    FAMILY HISTORY: Family History  Problem Relation Age of Onset   Breast cancer Maternal Grandmother    Heart disease Mother    Hypertension Mother    Sarcoidosis Father    Diabetes Maternal Grandfather     ALLERGIES:  has No Known Allergies.  MEDICATIONS:  Current Outpatient Medications  Medication Sig Dispense Refill   docusate sodium (COLACE) 100 MG capsule Take 100 mg by mouth 2 (two) times daily.     valACYclovir (VALTREX) 500 MG tablet Take 500 mg by mouth 2 (  two) times daily.     amLODipine (NORVASC) 10 MG tablet Take 10 mg by mouth daily.     No current facility-administered medications for this visit.    REVIEW OF SYSTEMS:   10 Point review of Systems was done is negative except as noted above.  PHYSICAL EXAMINATION: ECOG PERFORMANCE STATUS: 1 - Symptomatic but completely ambulatory  . Vitals:   09/07/20 0932  BP: 111/75  Pulse: 66  Resp: 18  Temp: 98.1 F (36.7 C)  SpO2: 100%    Filed Weights   09/07/20 0932  Weight: 178 lb 12.8 oz (81.1 kg)    .Body mass index is 28 kg/m.   GENERAL:alert, in no acute distress and comfortable SKIN: no acute rashes, no significant lesions EYES: conjunctiva are pink and non-injected, sclera anicteric OROPHARYNX: MMM, no exudates, no oropharyngeal erythema or ulceration NECK: supple, no JVD LYMPH:  no palpable lymphadenopathy in the cervical, axillary or inguinal regions LUNGS: clear to auscultation b/l with normal respiratory effort HEART: regular rate & rhythm ABDOMEN:  normoactive bowel sounds , non tender, not distended. Extremity: no pedal edema PSYCH: alert & oriented x 3 with fluent speech NEURO: no  focal motor/sensory deficits  LABORATORY DATA:  I have reviewed the data as listed  CBC Latest Ref Rng & Units 09/07/2020 09/02/2020 08/17/2020  WBC 4.0 - 10.5 K/uL 4.2 12.3(H) 5.3  Hemoglobin 12.0 - 15.0 g/dL 10.9(L) 11.9(L) 10.7(L)  Hematocrit 36.0 - 46.0 % 31.7(L) 34.6(L) 31.0(L)  Platelets 150 - 400 K/uL 252 251 235    CMP Latest Ref Rng & Units 09/07/2020 09/02/2020 08/17/2020  Glucose 70 - 99 mg/dL 141(H) 109(H) 120(H)  BUN 6 - 20 mg/dL 12 9 11   Creatinine 0.44 - 1.00 mg/dL 0.75 0.69 0.84  Sodium 135 - 145 mmol/L 142 137 144  Potassium 3.5 - 5.1 mmol/L 3.6 3.7 4.1  Chloride 98 - 111 mmol/L 110 104 110  CO2 22 - 32 mmol/L 21(L) 23 24  Calcium 8.9 - 10.3 mg/dL 9.0 9.3 9.4  Total Protein 6.5 - 8.1 g/dL 6.5 7.2 6.4(L)  Total Bilirubin 0.3 - 1.2 mg/dL 0.4 1.0 0.4  Alkaline Phos 38 - 126 U/L 54 59 52  AST 15 - 41 U/L 16 25 25   ALT 0 - 44 U/L 15 26 31    Component     Latest Ref Rng & Units 09/07/2020  IgG (Immunoglobin G), Serum     586 - 1,602 mg/dL 698  IgA     87 - 352 mg/dL 46 (L)  IgM (Immunoglobulin M), Srm     26 - 217 mg/dL 9 (L)  Total Protein ELP     6.0 - 8.5 g/dL 6.0  Albumin SerPl Elph-Mcnc     2.9 - 4.4 g/dL 3.5  Alpha 1     0.0 - 0.4 g/dL 0.3  Alpha2 Glob SerPl Elph-Mcnc     0.4 - 1.0 g/dL 0.7  B-Globulin SerPl Elph-Mcnc     0.7 - 1.3 g/dL 1.0  Gamma Glob SerPl Elph-Mcnc     0.4 - 1.8 g/dL 0.6  M Protein SerPl Elph-Mcnc     Not Observed g/dL Not Observed  Globulin, Total     2.2 - 3.9 g/dL 2.5  Albumin/Glob SerPl     0.7 - 1.7 1.5  IFE 1      Comment (A)  Please Note (HCV):      Comment  Kappa free light chain     3.3 - 19.4 mg/L 10.8  Lambda  free light chains     5.7 - 26.3 mg/L 6.2  Kappa, lambda light chain ratio     0.26 - 1.65 1.74 (H)  Magnesium     1.7 - 2.4 mg/dL 1.9    11/08/2019 FISH Plasma Cell Myeloma Prognostic Panel:    11/08/2019 Cytogenetics:    11/08/2019 Bone Marrow Report [phone]):   10/10/2019 Surgical  Pathology [phone]):   05/14/2020 Surgical Pathology "-Normocellular bone marrow for age with trilineage hematopoiesis and 1%  plasma cells  -See comment   PERIPHERAL BLOOD:  -Normocytic-normochromic anemia  -Leukopenia   COMMENT:   The bone marrow is generally normocellular for age with trilineage  hematopoiesis but with predominance of erythroid precursors.  The  myeloid changes are not considered specific and likely related to  therapy.  In this background, the plasma cells represent 1% of all cells  associated with scattered small clusters and display slight lambda light  chain excess.  The findings are limited but most suggestive of minimal  residual plasma cell neoplasm.  Correlation with cytogenetic and FISH  studies is recommended. "  RADIOGRAPHIC STUDIES:  I have personally reviewed the radiological images as listed and agreed with the findings in the report. CT Abdomen Pelvis W Contrast  Result Date: 09/02/2020 CLINICAL DATA:  Epigastric pain x1 day. Patient has history of multiple myeloma with bone marrow transplant in June of 2022. EXAM: CT ABDOMEN AND PELVIS WITH CONTRAST TECHNIQUE: Multidetector CT imaging of the abdomen and pelvis was performed using the standard protocol following bolus administration of intravenous contrast. CONTRAST:  87m OMNIPAQUE IOHEXOL 350 MG/ML SOLN COMPARISON:  None. FINDINGS: Lower chest: No acute abnormality. Hepatobiliary: No focal liver abnormality is seen. No gallstones, gallbladder wall thickening, or biliary dilatation. Pancreas: Unremarkable. No pancreatic ductal dilatation or surrounding inflammatory changes. Spleen: Normal in size without focal abnormality. Adrenals/Urinary Tract: Adrenal glands are unremarkable. Kidneys are normal, without renal calculi, focal lesion, or hydronephrosis. Bladder is unremarkable. Stomach/Bowel: Stomach is within normal limits. Appendix appears normal. No evidence of bowel dilatation. A short segment  of moderate to markedly thickened and inflamed large bowel is seen at the hepatic flexure. Numerous diverticula are noted throughout the large bowel. Vascular/Lymphatic: No significant vascular findings are present. No enlarged abdominal or pelvic lymph nodes. Reproductive: Uterus is unremarkable. Bilateral tubal ligation clips are seen. Other: No abdominal wall hernia or abnormality. No abdominopelvic ascites or free air. Musculoskeletal: No acute or significant osseous findings. IMPRESSION: Moderate to marked severity focal colitis and/or diverticulitis along the hepatic flexure. Electronically Signed   By: TVirgina NorfolkM.D.   On: 09/02/2020 19:23   DG Chest Port 1 View  Result Date: 09/02/2020 CLINICAL DATA:  Questionable sepsis. Epigastric pain. Headache and chills. EXAM: PORTABLE CHEST 1 VIEW COMPARISON:  None. FINDINGS: The heart size and mediastinal contours are within normal limits. Both lungs are clear. The visualized skeletal structures are unremarkable. IMPRESSION: No active disease. Electronically Signed   By: DDorise BullionIII M.D.   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 No M spike. Lambda light chain myeloma + Anemia + bone lesion on skull-- resected No renal failure, hypercalcemia  BMBx- 22-30% lambda restricted plasma cell MolCy Dup (1q), del (13q)  Completed 7 cycles of KRD  The pt recently had a transplant  at [facility]. DISEASE Lambda Light Chain Multiple Myeloma  - She will receive Melphalan 200 mg/m2 conditioning regimen, followed by autologous stem cell rescue (see details below).  TRANSPLANT: Preparative regimen:  - Melphalan 200 mg/m2 (315m) IV: 07/09/20  Stem Cells:  - Autologous stem cell reinfusion on 07/10/20. Patient received 4.71 x 10^6 CD34+/kg. - Her course was  complicated by neutropenic fevers and Bcx 6/27 positive for E coli. She received IV cefepime from 6/27 until 6/30, which cefepime was switched to zosyn in the setting of new abdominal pain. CT abdomen/pelvis was performed that showed acute uncomplicated multifocal diverticulitis involving the transverse and descending colon. She was treated with bowel rest, prn pain medication, and IV zosyn, which was continued until engraftment on 7/5 and was transitioned to ppx ceftriaxone. Unfortunately, with the transition to ceftriaxone she had recurrent fevers and as a result was restarted on zosyn. Zosyn was transitioned to oral cipro/flagyl on 7/10 with the plan to continue for a 7 day course.   3) prediabetes 4) hypertension 5) anxiety 6) dyslipidemia PLAN: -Discussed pt labwork today, .09/07/2020 - stable -No indications for transfusion support at this time. -Continue daily baby ASA, Valtrex, & B complex. -Follow-up with [facility] transplant team for day 100 PET scan and bone marrow biopsy to determine consolidation/maintenance regimen based on response. -We will help set up a post transplant vaccination schedule. -She will need to get her Shingrix vaccine with the pharmacy since we do not carry this.  FOLLOW UP: -We will set up post transplant vaccination -Return to clinic with Dr. KIrene Limbowith labs in 6 weeks  . The total time spent in the appointment was 30 minutes and more than 50% was on counseling and direct patient cares.    All of the patient's questions were answered with apparent satisfaction. The patient knows to call the clinic with any problems, questions or concerns.    GSullivan LoneMD MSalemAAHIVMS SSouth Jersey Health Care CenterCCastle Rock Surgicenter LLCHematology/Oncology Physician CG[facility] (Office):       [phone](Work cell):  [phone](Fax):           [phone]

## 2020-09-17 ENCOUNTER — Other Ambulatory Visit: Payer: Self-pay | Admitting: Nurse Practitioner

## 2020-09-17 ENCOUNTER — Other Ambulatory Visit: Payer: Self-pay | Admitting: Hematology

## 2020-09-20 ENCOUNTER — Encounter: Payer: Self-pay | Admitting: Nurse Practitioner

## 2020-09-25 ENCOUNTER — Other Ambulatory Visit: Payer: Self-pay

## 2020-09-25 ENCOUNTER — Inpatient Hospital Stay: Payer: 59 | Attending: Hematology

## 2020-09-25 DIAGNOSIS — C9000 Multiple myeloma not having achieved remission: Secondary | ICD-10-CM | POA: Insufficient documentation

## 2020-09-25 DIAGNOSIS — C7951 Secondary malignant neoplasm of bone: Secondary | ICD-10-CM

## 2020-09-25 LAB — CMP (CANCER CENTER ONLY)
ALT: 12 U/L (ref 0–44)
AST: 15 U/L (ref 15–41)
Albumin: 3.8 g/dL (ref 3.5–5.0)
Alkaline Phosphatase: 46 U/L (ref 38–126)
Anion gap: 10 (ref 5–15)
BUN: 15 mg/dL (ref 6–20)
CO2: 23 mmol/L (ref 22–32)
Calcium: 9.3 mg/dL (ref 8.9–10.3)
Chloride: 109 mmol/L (ref 98–111)
Creatinine: 0.8 mg/dL (ref 0.44–1.00)
GFR, Estimated: >60 mL/min (ref >60)
Glucose, Bld: 99 mg/dL (ref 70–99)
Potassium: 3.9 mmol/L (ref 3.5–5.1)
Sodium: 142 mmol/L (ref 135–145)
Total Bilirubin: 0.6 mg/dL (ref 0.3–1.2)
Total Protein: 6.3 g/dL — ABNORMAL LOW (ref 6.5–8.1)

## 2020-09-25 LAB — CBC WITH DIFFERENTIAL/PLATELET
Abs Immature Granulocytes: 0.01 10*3/uL (ref 0.00–0.07)
Basophils Absolute: 0 10*3/uL (ref 0.0–0.1)
Basophils Relative: 1 %
Eosinophils Absolute: 0.3 10*3/uL (ref 0.0–0.5)
Eosinophils Relative: 9 %
HCT: 33.7 % — ABNORMAL LOW (ref 36.0–46.0)
Hemoglobin: 11.9 g/dL — ABNORMAL LOW (ref 12.0–15.0)
Immature Granulocytes: 0 %
Lymphocytes Relative: 37 %
Lymphs Abs: 1.4 10*3/uL (ref 0.7–4.0)
MCH: 30.7 pg (ref 26.0–34.0)
MCHC: 35.3 g/dL (ref 30.0–36.0)
MCV: 86.9 fL (ref 80.0–100.0)
Monocytes Absolute: 0.4 10*3/uL (ref 0.1–1.0)
Monocytes Relative: 11 %
Neutro Abs: 1.6 10*3/uL — ABNORMAL LOW (ref 1.7–7.7)
Neutrophils Relative %: 42 %
Platelets: 218 10*3/uL (ref 150–400)
RBC: 3.88 MIL/uL (ref 3.87–5.11)
RDW: 14.7 % (ref 11.5–15.5)
WBC: 3.8 10*3/uL — ABNORMAL LOW (ref 4.0–10.5)
nRBC: 0 % (ref 0.0–0.2)

## 2020-09-26 LAB — KAPPA/LAMBDA LIGHT CHAINS
Kappa free light chain: 15.8 mg/L (ref 3.3–19.4)
Kappa, lambda light chain ratio: 1.65 (ref 0.26–1.65)
Lambda free light chains: 9.6 mg/L (ref 5.7–26.3)

## 2020-10-15 ENCOUNTER — Ambulatory Visit (INDEPENDENT_AMBULATORY_CARE_PROVIDER_SITE_OTHER): Payer: 59 | Admitting: Otolaryngology

## 2020-10-15 ENCOUNTER — Other Ambulatory Visit: Payer: Self-pay | Admitting: Nurse Practitioner

## 2020-10-15 ENCOUNTER — Other Ambulatory Visit: Payer: Self-pay

## 2020-10-15 DIAGNOSIS — R198 Other specified symptoms and signs involving the digestive system and abdomen: Secondary | ICD-10-CM | POA: Diagnosis not present

## 2020-10-15 DIAGNOSIS — R0989 Other specified symptoms and signs involving the circulatory and respiratory systems: Secondary | ICD-10-CM

## 2020-10-15 DIAGNOSIS — R09A2 Foreign body sensation, throat: Secondary | ICD-10-CM

## 2020-10-15 MED ORDER — BENAZEPRIL-HYDROCHLOROTHIAZIDE 20-12.5 MG PO TABS: 1.0000 | ORAL_TABLET | Freq: Every day | ORAL | 1 refills | Status: DC

## 2020-10-15 NOTE — Progress Notes (Signed)
HPI: Alexandra [name] is a 55 y.o. female who presents is referred by her PCP for evaluation of foreign body sensation she notes in her throat that comes and goes.  She states it is generally in the back of the throat more on the left side..  Past Medical History:  Diagnosis Date   Anxiety    Hypertension    Pre-diabetes    Past Surgical History:  Procedure Laterality Date   ABLATION     APPLICATION OF CRANIAL NAVIGATION N/A 10/10/2019   Procedure: APPLICATION OF CRANIAL NAVIGATION;  Surgeon: Consuella Lose, MD;  Location: [place];  Service: Neurosurgery;  Laterality: N/A;  APPLICATION OF CRANIAL NAVIGATION   CRANIOPLASTY Left 10/14/2019   Procedure: LEFT CRANIOPLASTY WITH PLACEMENT OF CUSTOM CRANIAL IMPLANT;  Surgeon: Consuella Lose, MD;  Location: [place];  Service: Neurosurgery;  Laterality: Left;   CRANIOTOMY Left 10/10/2019   Procedure: Left frontal Stereotactic craniectomy for resection of tumor;  Surgeon: Consuella Lose, MD;  Location: [place];  Service: Neurosurgery;  Laterality: Left;  Left frontal Stereotactic craniectomy for resection of tumor   HAMMER TOE SURGERY Left    IR IMAGING GUIDED PORT INSERTION  12/01/2019   TUBAL LIGATION     Social History   Socioeconomic History   Marital status: Single    Spouse name: Not on file   Number of children: Not on file   Years of education: Not on file   Highest education level: 12th grade  Occupational History   Not on file  Tobacco Use   Smoking status: Never   Smokeless tobacco: Never  Vaping Use   Vaping Use: Never used  Substance and Sexual Activity   Alcohol use: Yes    Comment: occassionally   Drug use: Not Currently   Sexual activity: Not on file  Other Topics Concern   Not on file  Social History Narrative   Not on file   Social Determinants of Health   Financial Resource Strain: Medium Risk   Difficulty of Paying Living Expenses: Somewhat hard  Food Insecurity: Food Insecurity Present   Worried  About Running Out of Food in the Last Year: Sometimes true   Arboriculturist in the Last Year: Never true  Transportation Needs: Unmet Transportation Needs   Lack of Transportation (Medical): No   Lack of Transportation (Non-Medical): Yes  Physical Activity: Sufficiently Active   Days of Exercise per Week: 5 days   Minutes of Exercise per Session: 30 min  Stress: No Stress Concern Present   Feeling of Stress : Only a little  Social Connections: Moderately Integrated   Frequency of Communication with Friends and Family: More than three times a week   Frequency of Social Gatherings with Friends and Family: Once a week   Attends Religious Services: More than 4 times per year   Active Member of [employer] or Organizations: Yes   Attends Archivist Meetings: 1 to 4 times per year   Marital Status: Divorced   Family History  Problem Relation Age of Onset   Breast cancer Maternal Grandmother    Heart disease Mother    Hypertension Mother    Sarcoidosis Father    Diabetes Maternal Grandfather    No Known Allergies Prior to Admission medications   Medication Sig Start Date End Date Taking? Authorizing Provider  amLODipine (NORVASC) 10 MG tablet Take 10 mg by mouth daily. 07/31/20   [provider]  docusate sodium (COLACE) 100 MG capsule Take 100 mg  by mouth 2 (two) times daily.    [provider]  traZODone (DESYREL) 50 MG tablet TAKE 1 TO 2 TABLETS BY MOUTH AT BEDTIME AS NEEDED FOR SLEEP 09/17/20   Brunetta Genera, MD  valACYclovir (VALTREX) 1000 MG tablet TAKE 1 TABLET BY MOUTH EVERY DAY AS NEEDED 09/18/20   [name], FNP  valACYclovir (VALTREX) 500 MG tablet Take 500 mg by mouth 2 (two) times daily. 07/06/20   [provider]     Positive ROS: Otherwise negative  All other systems have been reviewed and were otherwise negative with the exception of those mentioned in the HPI and as above.  Physical Exam: Constitutional: Alert, well-appearing,  no acute distress Ears: External ears without lesions or tenderness. Ear canals are clear bilaterally with intact, clear TMs.  Nasal: External nose without lesions. Clear nasal passages Oral: Lips and gums without lesions.  When the patient demonstrated where she feels like the sensation is she points with a Q-tip directly back in the oropharynx more on the left side in the region of the left tonsil.  The tonsil appears normal although the left tonsil slightly larger than the right tonsil.  On palpation with my finger the left tonsil is soft with no induration or palpable mass.  Indirect laryngoscopy revealed a clear base of tongue vallecula and epiglottis.  Vocal cords were clear bilaterally. Neck: No palpable adenopathy or masses.  Do not appreciate any adenopathy on either side of the neck. Respiratory: Breathing comfortably  Skin: No facial/neck lesions or rash noted.  Procedures  Assessment: Foreign body sensation in the oropharynx I suspect is related to the tonsil although this appears normal.  Plan: Reassured patient of clear oropharyngeal and upper airway examination. Recommended gargling with antiseptic when she is having symptoms of something in her throat and/or use of throat lozenges as needed. But reassured her of normal examination otherwise.   Radene Journey, MD   CC:

## 2020-10-16 ENCOUNTER — Other Ambulatory Visit: Payer: Self-pay

## 2020-10-16 ENCOUNTER — Telehealth: Payer: Self-pay | Admitting: Hematology

## 2020-10-16 ENCOUNTER — Inpatient Hospital Stay (HOSPITAL_BASED_OUTPATIENT_CLINIC_OR_DEPARTMENT_OTHER): Payer: 59 | Admitting: Hematology

## 2020-10-16 ENCOUNTER — Inpatient Hospital Stay: Payer: 59

## 2020-10-16 VITALS — BP 106/78 | HR 89 | Temp 97.6°F | Resp 17 | Ht 67.0 in | Wt 173.2 lb

## 2020-10-16 DIAGNOSIS — Z7189 Other specified counseling: Secondary | ICD-10-CM

## 2020-10-16 DIAGNOSIS — C9000 Multiple myeloma not having achieved remission: Secondary | ICD-10-CM

## 2020-10-16 DIAGNOSIS — C7951 Secondary malignant neoplasm of bone: Secondary | ICD-10-CM

## 2020-10-16 LAB — CMP (CANCER CENTER ONLY)
ALT: 11 U/L (ref 0–44)
AST: 14 U/L — ABNORMAL LOW (ref 15–41)
Albumin: 4 g/dL (ref 3.5–5.0)
Alkaline Phosphatase: 47 U/L (ref 38–126)
Anion gap: 9 (ref 5–15)
BUN: 17 mg/dL (ref 6–20)
CO2: 23 mmol/L (ref 22–32)
Calcium: 9.5 mg/dL (ref 8.9–10.3)
Chloride: 108 mmol/L (ref 98–111)
Creatinine: 0.78 mg/dL (ref 0.44–1.00)
GFR, Estimated: >60 mL/min (ref >60)
Glucose, Bld: 108 mg/dL — ABNORMAL HIGH (ref 70–99)
Potassium: 3.8 mmol/L (ref 3.5–5.1)
Sodium: 140 mmol/L (ref 135–145)
Total Bilirubin: 0.6 mg/dL (ref 0.3–1.2)
Total Protein: 6.5 g/dL (ref 6.5–8.1)

## 2020-10-16 LAB — CBC WITH DIFFERENTIAL/PLATELET
Abs Immature Granulocytes: 0.01 10*3/uL (ref 0.00–0.07)
Basophils Absolute: 0 10*3/uL (ref 0.0–0.1)
Basophils Relative: 1 %
Eosinophils Absolute: 0.1 10*3/uL (ref 0.0–0.5)
Eosinophils Relative: 3 %
HCT: 33.6 % — ABNORMAL LOW (ref 36.0–46.0)
Hemoglobin: 11.7 g/dL — ABNORMAL LOW (ref 12.0–15.0)
Immature Granulocytes: 0 %
Lymphocytes Relative: 33 %
Lymphs Abs: 1.2 10*3/uL (ref 0.7–4.0)
MCH: 30.4 pg (ref 26.0–34.0)
MCHC: 34.8 g/dL (ref 30.0–36.0)
MCV: 87.3 fL (ref 80.0–100.0)
Monocytes Absolute: 0.4 10*3/uL (ref 0.1–1.0)
Monocytes Relative: 10 %
Neutro Abs: 2 10*3/uL (ref 1.7–7.7)
Neutrophils Relative %: 53 %
Platelets: 205 10*3/uL (ref 150–400)
RBC: 3.85 MIL/uL — ABNORMAL LOW (ref 3.87–5.11)
RDW: 14.3 % (ref 11.5–15.5)
WBC: 3.8 10*3/uL — ABNORMAL LOW (ref 4.0–10.5)
nRBC: 0 % (ref 0.0–0.2)

## 2020-10-16 MED ORDER — LENALIDOMIDE 10 MG PO CAPS: 10.0000 mg | ORAL_CAPSULE | Freq: Every day | ORAL | 0 refills | Status: DC

## 2020-10-16 NOTE — Telephone Encounter (Signed)
Scheduled per 09/27 los, patient has been called and notified. 

## 2020-10-16 NOTE — Progress Notes (Signed)
HEMATOLOGY/ONCOLOGY CLNIC NOTE  Date of Service: .10/16/2020   Patient Care Team: [name], FNP as PCP - General (General Practice)  CHIEF COMPLAINTS/PURPOSE OF CONSULTATION:  Multiple myeloma- continued mx  HISTORY OF PRESENTING ILLNESS:   Alexandra [name] is a wonderful 55 y.o. female who has been referred to [place] by Dr. 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 The pt reports that she is doing well overall.   The pt reports that she was taking Benazepril-HCTZ for her HTN but was told to discontinue as she was becoming hypotensive while in the hospital. She continues taking Lipitor for her HLD and was previously on Ozempic for her Type 2 Diabetes. She has no significant family history of cancers or blood disorders. Pt has no known medication allergies. Pt had a hammer toe surgery and a tubal ligation. 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   In mid-June she began to notice a growth on the left side of her forehead. Because the lump continued to grow she visited her PCP at the end of June. Her PCP and Dermatologist thought that it was a lipoma initally. She was sent for a consult with Dr. Harlow Mares at [facility] in August. She received her first cranial MRI on 10/05/2019, which revealed a mass and bone erosion. Pt had her initial Craniectomy on 10/11/2019 and a Left Bifrontal Cranioplasty on 10/14/2019. Pt denies any new bone pain, fevers, chills, or night sweats at the time. Pt has lost 40 lbs over the last year, but this was after the addition of Ozempic and improved dietary and exercise habits. She has started regaining weight after holding Ozempic. At this time pt reports mild discomfort at surgical site and no other pertinent symptoms.  Of note prior to the patient's visit today, pt has had Surgical Pathology [phone]) completed on 10/10/2019 with results revealing  "A. BRAIN TUMOR, LEFT FRONTAL, RESECTION: -  Plasma cell neoplasm B. BRAIN TUMOR, LEFT FRONTAL, RESECTION: -  Plasma cell neoplasm C. BONE, LEFT FRONTAL, RESECTION: -  Plasma cell neoplasm."   Pt has had MRI Brain ([id number]) completed on 10/11/2019 with results revealing "No complicating feature or residual tumor seen after left frontal bone mass resection."  Pt has had MRI Brain ([id number]) completed on 10/05/2019 with results revealing "1. Large extra-axial mass in the anterior left frontal region with bone erosion and extension into the scalp soft tissues. Considerations include hemangiopericytoma, metastasis, and aggressive meningioma. 2. 6 mm enhancing lesion in the clivus. This is indeterminate but does raise concern for metastatic disease. 3. Mild chronic small vessel ischemic disease."  Most recent lab results (10/11/2019) of CBC & BMP is as follows: all values are WNL except for WBC at 16.6K, RBC at 2.99, Hgb at 9.1, HCT at 26.5, Glucose at 104, Calcium at 8.4.  On review of systems, pt reports discomfort at surgicalsite and denies balance issues, memory loss, headaches, fevers, chills, unexpected weight loss and any othe symptoms.   On PMHx the pt reports HTN, HLD, Diabetes, Tubal Ligation, Hammer Toe Surgery, Herpes. On Social Hx the pt reports that she previously smoked on and off but is not currently smoking and does not drink much alcohol. On Family Hx the pt reports a grandfather with prostate cancer.    INTERVAL HISTORY:  Alexandra [name] is a wonderful 55 y.o. 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 Notes no acute issues since her last visit with [place] about a month ago  Patient notes her episode of diverticulitis is completely resolved and she has no symptoms from this. Patient has not had her 100 1000 PET CT and bone marrow  biopsy and we shall confirm with her team at [facility] the timings for this.  Recommendations from the transplant team suggests she should be started on maintenance Revlimid which we shall do. Post transplant vaccines being addressed at [facility].  She had her COVID-19 vaccine on 10/03/2020.  No other acute new symptoms today.  Labs today 10/16/2020 show stable CBC, stable CMP, myeloma panel with no M spike and negative IFE, kappa lambda light chains within normal limits with normal ratio.   ONCOLOGIC HISTORY   Lambda Light Chain Multiple Myeloma  A. 09/27/19: CT Maxillofacial enhancing dural-based anterior cranial fossa mass traversing the calvarium and extending into the extracalvarial soft tissues along with a 7.4 mm rightward midline shift at the level of anterior cranial fossa. MRI head with and without contrast is recommended for further evaluation. B. 10/05/19: MRI Head large extra-axial mass anterior left frontal region with bone erosion and extension into the scalp soft tissues. Considerations include hemangiopericytoma, metastasis, and aggressive hemangioma. 2.6 mm enhancing lesion in the clivus.  C. 10/10/19: Left craniectomy with frontal brain tumor/bone resection, path + plasma cell neoplasm, lambda restricted, plasmacytoma favored. Hgb 9.9,  D. 10/31/19: Initial Hem-Onc consultation; M-spike 0; IgG 627, IgQ 51, IgM 11; SFLC: Kappa 1.12 mg/dL, Lambda 154.50, ratio 0.01; IFE: serum + monoclonal free Lambda light chain; Hgb 10.1,  E. 11/04/19: 24 hour UPEP Total protein 1318, Free lambda light chains Ur 1332.46, ratio 0.02, M-spike 27.4, M-spike 24 hr at 361; Bence Jones positive, Lambda type F. 11/08/19: BMBx 22% atypical plasma cells, 30% by IHC; FISH Dup(1q) and Del(13q) detected G. 11/10/19: PET CT no metabolic activity within the skeleton to localize active myeloma; no lytic lesions identified; no abnormal activity at the left craniectomy site; no evidence of soft tissue plasmacytoma. H.  12/05/19: Started KRD; SFLC: Kappa 1.42, Lambda 177.97, ratio 0.01; Hgb 11.0 I. 12//13/21: SFLC: Kappa 1.45, Lambda 8.92, ratio 0.16; Hgb 11.1 J. 02/06/20: SFLC: Kappa 1.10, Lambda 2.08, ratio 0.53; Hgb 11.3 K. 02/26/20: Cycle 4 of KRD; SFLC: Kappa 1.14, Lambda 1.90, ratio 0.60; Hgb 10.8 L. 04/02/20: SFLC: Kappa 1.05, Lambda 1.56, ratio 0.67; Hgb 11.2 M. 04/30/20: SFLC: Kappa 1.01, Lambda 1.40, ratio 0.72; Hgb 11.5  N. 05/05/20: MRI Brain no evidence of left frontal bone lesion recurrence; Multiple subcentimeter area of nodular enhancement in the calvarium are nonspecific but could related to myeloma given nonvisualization on prior. O. 05/11/20: PET CT no findings to suggest FDG avid lesions of multiple myeloma. P. 05/14/20: BMBx 1% plasma cells Q. 05/28/20: SFLC: Kappa 0.84, Lambda 1.16, ratio 0.72; Hgb 11.6  R. 06/11/20: EKG QT 408/QTc 424; PFT's: FEV1 110%, DLCO 58.8% predicted, 64.4% hgb corrected; ECHO: EF 55% est, 61% calc; GLS -20%; SFLC: Kappa 1.16, Lambda 1.40, ratio 0.83; Hgb 10.9-VGPR S. 07/06/20: SFLC: Kappa 1.16, Lambda 2.14, ratio 0.54; . Hgb 11.7, Plt 118 T. 6/20-7/12/22: Hospital admission for Melphalan 200 mg/m2 followed by reinfusion of 4.71 x 10^6/kg CD34+ autologous cells MEDICAL HISTORY:  Past Medical History:  Diagnosis Date   Anxiety    Hypertension    Pre-diabetes     SURGICAL HISTORY: Past Surgical History:  Procedure Laterality  Date   ABLATION     APPLICATION OF CRANIAL NAVIGATION N/A 10/10/2019   Procedure: APPLICATION OF CRANIAL NAVIGATION;  Surgeon: Consuella Lose, MD;  Location: [place];  Service: Neurosurgery;  Laterality: N/A;  APPLICATION OF CRANIAL NAVIGATION   CRANIOPLASTY Left 10/14/2019   Procedure: LEFT CRANIOPLASTY WITH PLACEMENT OF CUSTOM CRANIAL IMPLANT;  Surgeon: Consuella Lose, MD;  Location: [place];  Service: Neurosurgery;  Laterality: Left;   CRANIOTOMY Left 10/10/2019   Procedure: Left frontal Stereotactic craniectomy for resection of tumor;  Surgeon:  Consuella Lose, MD;  Location: [place];  Service: Neurosurgery;  Laterality: Left;  Left frontal Stereotactic craniectomy for resection of tumor   HAMMER TOE SURGERY Left    IR IMAGING GUIDED PORT INSERTION  12/01/2019   TUBAL LIGATION      SOCIAL HISTORY: Social History   Socioeconomic History   Marital status: Single    Spouse name: Not on file   Number of children: Not on file   Years of education: Not on file   Highest education level: 12th grade  Occupational History   Not on file  Tobacco Use   Smoking status: Never   Smokeless tobacco: Never  Vaping Use   Vaping Use: Never used  Substance and Sexual Activity   Alcohol use: Yes    Comment: occassionally   Drug use: Not Currently   Sexual activity: Not on file  Other Topics Concern   Not on file  Social History Narrative   Not on file   Social Determinants of Health   Financial Resource Strain: Medium Risk   Difficulty of Paying Living Expenses: Somewhat hard  Food Insecurity: Food Insecurity Present   Worried About Charity fundraiser in the Last Year: Sometimes true   Arboriculturist in the Last Year: Never true  Transportation Needs: Unmet Transportation Needs   Lack of Transportation (Medical): No   Lack of Transportation (Non-Medical): Yes  Physical Activity: Sufficiently Active   Days of Exercise per Week: 5 days   Minutes of Exercise per Session: 30 min  Stress: No Stress Concern Present   Feeling of Stress : Only a little  Social Connections: Moderately Integrated   Frequency of Communication with Friends and Family: More than three times a week   Frequency of Social Gatherings with Friends and Family: Once a week   Attends Religious Services: More than 4 times per year   Active Member of [employer] or Organizations: Yes   Attends Archivist Meetings: 1 to 4 times per year   Marital Status: Divorced  Human resources officer Violence: Not on file    FAMILY HISTORY: Family History  Problem  Relation Age of Onset   Breast cancer Maternal Grandmother    Heart disease Mother    Hypertension Mother    Sarcoidosis Father    Diabetes Maternal Grandfather     ALLERGIES:  has No Known Allergies.  MEDICATIONS:  Current Outpatient Medications  Medication Sig Dispense Refill   benazepril-hydrochlorthiazide (LOTENSIN HCT) 10-12.5 MG tablet Take 1 tablet by mouth daily. 90 tablet 1   benazepril-hydrochlorthiazide (LOTENSIN HCT) 20-12.5 MG tablet Take 1 tablet by mouth daily. 30 tablet 1   docusate sodium (COLACE) 100 MG capsule Take 100 mg by mouth 2 (two) times daily.     lenalidomide (REVLIMID) 10 MG capsule Take 1 capsule (10 mg total) by mouth daily. Take 1 capsule by mouth daily for 21 days. Hold medication daily for 7 days, Repeat cycle every 28 days.  21 capsule 0   traZODone (DESYREL) 50 MG tablet TAKE 1 TO 2 TABLETS BY MOUTH AT BEDTIME AS NEEDED FOR SLEEP 180 tablet 1   valACYclovir (VALTREX) 1000 MG tablet TAKE 1 TABLET BY MOUTH EVERY DAY AS NEEDED 90 tablet 1   valACYclovir (VALTREX) 500 MG tablet Take 500 mg by mouth 2 (two) times daily.     No current facility-administered medications for this visit.    REVIEW OF SYSTEMS:   .10 Point review of Systems was done is negative except as noted above.   PHYSICAL EXAMINATION: ECOG PERFORMANCE STATUS: 1 - Symptomatic but completely ambulatory  . Vitals:   10/16/20 1509  BP: 106/78  Pulse: 89  Resp: 17  Temp: 97.6 F (36.4 C)  SpO2: 100%    Filed Weights   10/16/20 1509  Weight: 173 lb 3.2 oz (78.6 kg)    .Body mass index is 27.13 kg/m.  Marland Kitchen GENERAL:alert, in no acute distress and comfortable SKIN: no acute rashes, no significant lesions EYES: conjunctiva are pink and non-injected, sclera anicteric OROPHARYNX: MMM, no exudates, no oropharyngeal erythema or ulceration NECK: supple, no JVD LYMPH:  no palpable lymphadenopathy in the cervical, axillary or inguinal regions LUNGS: clear to auscultation b/l with  normal respiratory effort HEART: regular rate & rhythm ABDOMEN:  normoactive bowel sounds , non tender, not distended. Extremity: no pedal edema PSYCH: alert & oriented x 3 with fluent speech NEURO: no focal motor/sensory deficits  LABORATORY DATA:  I have reviewed the data as listed  CBC Latest Ref Rng & Units 10/16/2020 09/25/2020 09/07/2020  WBC 4.0 - 10.5 K/uL 3.8(L) 3.8(L) 4.2  Hemoglobin 12.0 - 15.0 g/dL 11.7(L) 11.9(L) 10.9(L)  Hematocrit 36.0 - 46.0 % 33.6(L) 33.7(L) 31.7(L)  Platelets 150 - 400 K/uL 205 218 252    CMP Latest Ref Rng & Units 10/16/2020 09/25/2020 09/07/2020  Glucose 70 - 99 mg/dL 108(H) 99 141(H)  BUN 6 - 20 mg/dL 17 15 12   Creatinine 0.44 - 1.00 mg/dL 0.78 0.80 0.75  Sodium 135 - 145 mmol/L 140 142 142  Potassium 3.5 - 5.1 mmol/L 3.8 3.9 3.6  Chloride 98 - 111 mmol/L 108 109 110  CO2 22 - 32 mmol/L 23 23 21(L)  Calcium 8.9 - 10.3 mg/dL 9.5 9.3 9.0  Total Protein 6.5 - 8.1 g/dL 6.5 6.3(L) 6.5  Total Bilirubin 0.3 - 1.2 mg/dL 0.6 0.6 0.4  Alkaline Phos 38 - 126 U/L 47 46 54  AST 15 - 41 U/L 14(L) 15 16  ALT 0 - 44 U/L 11 12 15    Component     Latest Ref Rng & Units 09/07/2020  IgG (Immunoglobin G), Serum     586 - 1,602 mg/dL 698  IgA     87 - 352 mg/dL 46 (L)  IgM (Immunoglobulin M), Srm     26 - 217 mg/dL 9 (L)  Total Protein ELP     6.0 - 8.5 g/dL 6.0  Albumin SerPl Elph-Mcnc     2.9 - 4.4 g/dL 3.5  Alpha 1     0.0 - 0.4 g/dL 0.3  Alpha2 Glob SerPl Elph-Mcnc     0.4 - 1.0 g/dL 0.7  B-Globulin SerPl Elph-Mcnc     0.7 - 1.3 g/dL 1.0  Gamma Glob SerPl Elph-Mcnc     0.4 - 1.8 g/dL 0.6  M Protein SerPl Elph-Mcnc     Not Observed g/dL Not Observed  Globulin, Total     2.2 - 3.9 g/dL 2.5  Albumin/Glob SerPl  0.7 - 1.7 1.5  IFE 1      Comment (A)  Please Note (HCV):      Comment  Kappa free light chain     3.3 - 19.4 mg/L 10.8  Lambda free light chains     5.7 - 26.3 mg/L 6.2  Kappa, lambda light chain ratio     0.26 - 1.65 1.74 (H)   Magnesium     1.7 - 2.4 mg/dL 1.9    11/08/2019 FISH Plasma Cell Myeloma Prognostic Panel:    11/08/2019 Cytogenetics:    11/08/2019 Bone Marrow Report [phone] [phone]):   10/10/2019 Surgical Pathology [phone]):   05/14/2020 Surgical Pathology "-Normocellular bone marrow for age with trilineage hematopoiesis and 1%  plasma cells  -See comment   PERIPHERAL BLOOD:  -Normocytic-normochromic anemia  -Leukopenia   COMMENT:   The bone marrow is generally normocellular for age with trilineage  hematopoiesis but with predominance of erythroid precursors.  The  myeloid changes are not considered specific and likely related to  therapy.  In this background, the plasma cells represent 1% of all cells  associated with scattered small clusters and display slight lambda light  chain excess.  The findings are limited but most suggestive of minimal  residual plasma cell neoplasm.  Correlation with cytogenetic and FISH  studies is recommended. "  RADIOGRAPHIC STUDIES:  I have personally reviewed the radiological images as listed and agreed with the findings in the report. No results found.   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 No M spike. Lambda light chain myeloma + Anemia + bone lesion on skull-- resected No renal failure, hypercalcemia  BMBx- 22-30% lambda restricted plasma cell MolCy Dup (1q), del (13q)  Completed 7 cycles of KRD  The pt recently had a transplant at [facility]. DISEASE Lambda Light Chain Multiple Myeloma  - She will receive Melphalan 200 mg/m2 conditioning regimen, followed by autologous stem cell rescue (see details below).  TRANSPLANT: Preparative regimen:  - Melphalan 200 mg/m2 (344m) IV: 07/09/20  Stem Cells:  - Autologous stem cell reinfusion on 07/10/20. Patient received 4.71  x 10^6 CD34+/kg. - Her course was complicated by neutropenic fevers and Bcx 6/27 positive for E coli. She received IV cefepime from 6/27 until 6/30, which cefepime was switched to zosyn in the setting of new abdominal pain. CT abdomen/pelvis was performed that showed acute uncomplicated multifocal diverticulitis involving the transverse and descending colon. She was treated with bowel rest, prn pain medication, and IV zosyn, which was continued until engraftment on 7/5 and was transitioned to ppx ceftriaxone. Unfortunately, with the transition to ceftriaxone she had recurrent fevers and as a result was restarted on zosyn. Zosyn was transitioned to oral cipro/flagyl on 7/10 with the plan to continue for a 7 day course.   3) prediabetes 4) hypertension 5) anxiety 6) dyslipidemia PLAN: -Discussed pt labwork today, .10/16/2020 - stable -No indications for transfusion support at this time. -Continue daily baby ASA, Valtrex, & B complex. -She will be started on maintenance Revlimid as recommended -We will plan to start on Zometa every 2 months upon next visit. -Post transplant vaccinations with transplant team. -Patient will get in touch with her transplant team regarding timing for her PET CT scan, repeat bone marrow biopsy and MRI of the brain.  FOLLOW UP: Return to clinic with Dr. KIrene Limbo  with labs in 2 months  . The total time spent in the appointment was 30 minutes and more than 50% was on counseling and direct patient cares.   All of the patient's questions were answered with apparent satisfaction. The patient knows to call the clinic with any problems, questions or concerns.    Sullivan Lone MD [place] AAHIVMS [facility] Hematology/Oncology Physician [facility]

## 2020-10-17 ENCOUNTER — Other Ambulatory Visit: Payer: Self-pay

## 2020-10-17 LAB — KAPPA/LAMBDA LIGHT CHAINS
Kappa free light chain: 15.3 mg/L (ref 3.3–19.4)
Kappa, lambda light chain ratio: 1.58 (ref 0.26–1.65)
Lambda free light chains: 9.7 mg/L (ref 5.7–26.3)

## 2020-10-17 MED ORDER — BENAZEPRIL-HYDROCHLOROTHIAZIDE 10-12.5 MG PO TABS: 1.0000 | ORAL_TABLET | Freq: Every day | ORAL | 1 refills | Status: DC

## 2020-10-20 LAB — MULTIPLE MYELOMA PANEL, SERUM
Albumin SerPl Elph-Mcnc: 3.8 g/dL (ref 2.9–4.4)
Albumin/Glob SerPl: 1.6 (ref 0.7–1.7)
Alpha 1: 0.2 g/dL (ref 0.0–0.4)
Alpha2 Glob SerPl Elph-Mcnc: 0.5 g/dL (ref 0.4–1.0)
B-Globulin SerPl Elph-Mcnc: 0.8 g/dL (ref 0.7–1.3)
Gamma Glob SerPl Elph-Mcnc: 0.8 g/dL (ref 0.4–1.8)
Globulin, Total: 2.4 g/dL (ref 2.2–3.9)
IgA: 55 mg/dL — ABNORMAL LOW (ref 87–352)
IgG (Immunoglobin G), Serum: 807 mg/dL (ref 586–1602)
IgM (Immunoglobulin M), Srm: 14 mg/dL — ABNORMAL LOW (ref 26–217)
Total Protein ELP: 6.2 g/dL (ref 6.0–8.5)

## 2020-10-23 ENCOUNTER — Encounter: Payer: Self-pay | Admitting: Hematology

## 2020-11-01 ENCOUNTER — Other Ambulatory Visit: Payer: Self-pay | Admitting: Nurse Practitioner

## 2020-11-01 ENCOUNTER — Other Ambulatory Visit: Payer: Self-pay

## 2020-11-01 ENCOUNTER — Other Ambulatory Visit: Payer: 59

## 2020-11-01 DIAGNOSIS — R7303 Prediabetes: Secondary | ICD-10-CM

## 2020-11-01 DIAGNOSIS — E782 Mixed hyperlipidemia: Secondary | ICD-10-CM

## 2020-11-01 DIAGNOSIS — I10 Essential (primary) hypertension: Secondary | ICD-10-CM

## 2020-11-01 LAB — LIPID PANEL
Chol/HDL Ratio: 3 ratio (ref 0.0–4.4)
Cholesterol, Total: 166 mg/dL (ref 100–199)
HDL: 55 mg/dL (ref >39)
LDL Chol Calc (NIH): 95 mg/dL (ref 0–99)
Triglycerides: 83 mg/dL (ref 0–149)
VLDL Cholesterol Cal: 16 mg/dL (ref 5–40)

## 2020-11-01 LAB — HEMOGLOBIN A1C
Est. average glucose Bld gHb Est-mCnc: 97 mg/dL
Hgb A1c MFr Bld: 5 % (ref 4.8–5.6)

## 2020-11-01 LAB — CMP14+EGFR
ALT: 9 IU/L (ref 0–32)
AST: 12 IU/L (ref 0–40)
Albumin/Globulin Ratio: 2.7 — ABNORMAL HIGH (ref 1.2–2.2)
Albumin: 4.9 g/dL (ref 3.8–4.9)
Alkaline Phosphatase: 55 IU/L (ref 44–121)
BUN/Creatinine Ratio: 17 (ref 9–23)
BUN: 14 mg/dL (ref 6–24)
Bilirubin Total: 0.5 mg/dL (ref 0.0–1.2)
CO2: 25 mmol/L (ref 20–29)
Calcium: 9.6 mg/dL (ref 8.7–10.2)
Chloride: 100 mmol/L (ref 96–106)
Creatinine, Ser: 0.84 mg/dL (ref 0.57–1.00)
Globulin, Total: 1.8 g/dL (ref 1.5–4.5)
Glucose: 92 mg/dL (ref 70–99)
Potassium: 4 mmol/L (ref 3.5–5.2)
Sodium: 143 mmol/L (ref 134–144)
Total Protein: 6.7 g/dL (ref 6.0–8.5)
eGFR: 81 mL/min/{1.73_m2} (ref >59)

## 2020-11-01 NOTE — Progress Notes (Signed)
Ordered labs

## 2020-11-08 ENCOUNTER — Other Ambulatory Visit: Payer: Self-pay

## 2020-11-08 ENCOUNTER — Other Ambulatory Visit: Payer: Self-pay | Admitting: Hematology

## 2020-11-08 DIAGNOSIS — C9000 Multiple myeloma not having achieved remission: Secondary | ICD-10-CM

## 2020-11-09 ENCOUNTER — Inpatient Hospital Stay: Payer: 59 | Attending: Hematology

## 2020-11-09 ENCOUNTER — Other Ambulatory Visit: Payer: Self-pay

## 2020-11-09 DIAGNOSIS — Z7189 Other specified counseling: Secondary | ICD-10-CM

## 2020-11-09 DIAGNOSIS — C7951 Secondary malignant neoplasm of bone: Secondary | ICD-10-CM

## 2020-11-09 DIAGNOSIS — C9000 Multiple myeloma not having achieved remission: Secondary | ICD-10-CM | POA: Diagnosis not present

## 2020-11-09 LAB — CBC WITH DIFFERENTIAL/PLATELET
Abs Immature Granulocytes: 0 10*3/uL (ref 0.00–0.07)
Basophils Absolute: 0 10*3/uL (ref 0.0–0.1)
Basophils Relative: 1 %
Eosinophils Absolute: 0.2 10*3/uL (ref 0.0–0.5)
Eosinophils Relative: 5 %
HCT: 34.8 % — ABNORMAL LOW (ref 36.0–46.0)
Hemoglobin: 12.1 g/dL (ref 12.0–15.0)
Immature Granulocytes: 0 %
Lymphocytes Relative: 35 %
Lymphs Abs: 1.4 10*3/uL (ref 0.7–4.0)
MCH: 30.4 pg (ref 26.0–34.0)
MCHC: 34.8 g/dL (ref 30.0–36.0)
MCV: 87.4 fL (ref 80.0–100.0)
Monocytes Absolute: 0.4 10*3/uL (ref 0.1–1.0)
Monocytes Relative: 11 %
Neutro Abs: 1.9 10*3/uL (ref 1.7–7.7)
Neutrophils Relative %: 48 %
Platelets: 212 10*3/uL (ref 150–400)
RBC: 3.98 MIL/uL (ref 3.87–5.11)
RDW: 13.5 % (ref 11.5–15.5)
WBC: 3.9 10*3/uL — ABNORMAL LOW (ref 4.0–10.5)
nRBC: 0 % (ref 0.0–0.2)

## 2020-11-09 LAB — CMP (CANCER CENTER ONLY)
ALT: 16 U/L (ref 0–44)
AST: 20 U/L (ref 15–41)
Albumin: 4.3 g/dL (ref 3.5–5.0)
Alkaline Phosphatase: 45 U/L (ref 38–126)
Anion gap: 6 (ref 5–15)
BUN: 14 mg/dL (ref 6–20)
CO2: 29 mmol/L (ref 22–32)
Calcium: 9 mg/dL (ref 8.9–10.3)
Chloride: 103 mmol/L (ref 98–111)
Creatinine: 0.95 mg/dL (ref 0.44–1.00)
GFR, Estimated: >60 mL/min (ref >60)
Glucose, Bld: 89 mg/dL (ref 70–99)
Potassium: 3.6 mmol/L (ref 3.5–5.1)
Sodium: 138 mmol/L (ref 135–145)
Total Bilirubin: 0.6 mg/dL (ref 0.3–1.2)
Total Protein: 6.9 g/dL (ref 6.5–8.1)

## 2020-11-12 ENCOUNTER — Encounter: Payer: Self-pay | Admitting: Hematology

## 2020-11-12 ENCOUNTER — Other Ambulatory Visit: Payer: Self-pay

## 2020-11-12 LAB — KAPPA/LAMBDA LIGHT CHAINS
Kappa free light chain: 14.5 mg/L (ref 3.3–19.4)
Kappa, lambda light chain ratio: 1.93 — ABNORMAL HIGH (ref 0.26–1.65)
Lambda free light chains: 7.5 mg/L (ref 5.7–26.3)

## 2020-11-13 LAB — MULTIPLE MYELOMA PANEL, SERUM
Albumin SerPl Elph-Mcnc: 3.9 g/dL (ref 2.9–4.4)
Albumin/Glob SerPl: 1.7 (ref 0.7–1.7)
Alpha 1: 0.2 g/dL (ref 0.0–0.4)
Alpha2 Glob SerPl Elph-Mcnc: 0.6 g/dL (ref 0.4–1.0)
B-Globulin SerPl Elph-Mcnc: 0.9 g/dL (ref 0.7–1.3)
Gamma Glob SerPl Elph-Mcnc: 0.7 g/dL (ref 0.4–1.8)
Globulin, Total: 2.4 g/dL (ref 2.2–3.9)
IgA: 57 mg/dL — ABNORMAL LOW (ref 87–352)
IgG (Immunoglobin G), Serum: 818 mg/dL (ref 586–1602)
IgM (Immunoglobulin M), Srm: 17 mg/dL — ABNORMAL LOW (ref 26–217)
Total Protein ELP: 6.3 g/dL (ref 6.0–8.5)

## 2020-11-26 ENCOUNTER — Other Ambulatory Visit: Payer: Self-pay | Admitting: Hematology

## 2020-11-30 ENCOUNTER — Other Ambulatory Visit: Payer: Self-pay | Admitting: Hematology

## 2020-11-30 DIAGNOSIS — C9000 Multiple myeloma not having achieved remission: Secondary | ICD-10-CM

## 2020-12-04 ENCOUNTER — Other Ambulatory Visit: Payer: Self-pay

## 2020-12-04 ENCOUNTER — Encounter: Payer: Self-pay | Admitting: Hematology

## 2020-12-10 ENCOUNTER — Telehealth: Payer: Self-pay

## 2020-12-10 NOTE — Telephone Encounter (Signed)
Notified Patient of Prior Authorization approval for Revlimid (Lenalidomide) Capsules. Medication is approved through 12/09/2021

## 2020-12-18 ENCOUNTER — Inpatient Hospital Stay (HOSPITAL_BASED_OUTPATIENT_CLINIC_OR_DEPARTMENT_OTHER): Payer: 59 | Admitting: Hematology

## 2020-12-18 ENCOUNTER — Other Ambulatory Visit: Payer: Self-pay

## 2020-12-18 ENCOUNTER — Inpatient Hospital Stay: Payer: 59 | Attending: Hematology

## 2020-12-18 VITALS — BP 113/71 | HR 62 | Temp 97.7°F | Resp 20 | Wt 167.7 lb

## 2020-12-18 DIAGNOSIS — R7303 Prediabetes: Secondary | ICD-10-CM | POA: Diagnosis not present

## 2020-12-18 DIAGNOSIS — Z7982 Long term (current) use of aspirin: Secondary | ICD-10-CM | POA: Insufficient documentation

## 2020-12-18 DIAGNOSIS — C9001 Multiple myeloma in remission: Secondary | ICD-10-CM | POA: Diagnosis not present

## 2020-12-18 DIAGNOSIS — I10 Essential (primary) hypertension: Secondary | ICD-10-CM | POA: Insufficient documentation

## 2020-12-18 DIAGNOSIS — C7951 Secondary malignant neoplasm of bone: Secondary | ICD-10-CM

## 2020-12-18 DIAGNOSIS — R002 Palpitations: Secondary | ICD-10-CM | POA: Diagnosis not present

## 2020-12-18 DIAGNOSIS — C9000 Multiple myeloma not having achieved remission: Secondary | ICD-10-CM | POA: Diagnosis not present

## 2020-12-18 LAB — CBC WITH DIFFERENTIAL/PLATELET
Abs Immature Granulocytes: 0.01 10*3/uL (ref 0.00–0.07)
Basophils Absolute: 0 10*3/uL (ref 0.0–0.1)
Basophils Relative: 1 %
Eosinophils Absolute: 0.3 10*3/uL (ref 0.0–0.5)
Eosinophils Relative: 8 %
HCT: 38.4 % (ref 36.0–46.0)
Hemoglobin: 13.3 g/dL (ref 12.0–15.0)
Immature Granulocytes: 0 %
Lymphocytes Relative: 35 %
Lymphs Abs: 1.2 10*3/uL (ref 0.7–4.0)
MCH: 29.5 pg (ref 26.0–34.0)
MCHC: 34.6 g/dL (ref 30.0–36.0)
MCV: 85.1 fL (ref 80.0–100.0)
Monocytes Absolute: 0.3 10*3/uL (ref 0.1–1.0)
Monocytes Relative: 10 %
Neutro Abs: 1.6 10*3/uL — ABNORMAL LOW (ref 1.7–7.7)
Neutrophils Relative %: 46 %
Platelets: 194 10*3/uL (ref 150–400)
RBC: 4.51 MIL/uL (ref 3.87–5.11)
RDW: 14.1 % (ref 11.5–15.5)
WBC: 3.4 10*3/uL — ABNORMAL LOW (ref 4.0–10.5)
nRBC: 0 % (ref 0.0–0.2)

## 2020-12-18 LAB — CMP (CANCER CENTER ONLY)
ALT: 14 U/L (ref 0–44)
AST: 16 U/L (ref 15–41)
Albumin: 4.5 g/dL (ref 3.5–5.0)
Alkaline Phosphatase: 42 U/L (ref 38–126)
Anion gap: 11 (ref 5–15)
BUN: 12 mg/dL (ref 6–20)
CO2: 25 mmol/L (ref 22–32)
Calcium: 9.2 mg/dL (ref 8.9–10.3)
Chloride: 101 mmol/L (ref 98–111)
Creatinine: 0.74 mg/dL (ref 0.44–1.00)
GFR, Estimated: >60 mL/min (ref >60)
Glucose, Bld: 78 mg/dL (ref 70–99)
Potassium: 3.8 mmol/L (ref 3.5–5.1)
Sodium: 137 mmol/L (ref 135–145)
Total Bilirubin: 0.7 mg/dL (ref 0.3–1.2)
Total Protein: 7.3 g/dL (ref 6.5–8.1)

## 2020-12-19 LAB — KAPPA/LAMBDA LIGHT CHAINS
Kappa free light chain: 20.2 mg/L — ABNORMAL HIGH (ref 3.3–19.4)
Kappa, lambda light chain ratio: 1.54 (ref 0.26–1.65)
Lambda free light chains: 13.1 mg/L (ref 5.7–26.3)

## 2020-12-21 LAB — MULTIPLE MYELOMA PANEL, SERUM
Albumin SerPl Elph-Mcnc: 4.1 g/dL (ref 2.9–4.4)
Albumin/Glob SerPl: 1.6 (ref 0.7–1.7)
Alpha 1: 0.2 g/dL (ref 0.0–0.4)
Alpha2 Glob SerPl Elph-Mcnc: 0.7 g/dL (ref 0.4–1.0)
B-Globulin SerPl Elph-Mcnc: 1 g/dL (ref 0.7–1.3)
Gamma Glob SerPl Elph-Mcnc: 0.8 g/dL (ref 0.4–1.8)
Globulin, Total: 2.7 g/dL (ref 2.2–3.9)
IgA: 60 mg/dL — ABNORMAL LOW (ref 87–352)
IgG (Immunoglobin G), Serum: 850 mg/dL (ref 586–1602)
IgM (Immunoglobulin M), Srm: 19 mg/dL — ABNORMAL LOW (ref 26–217)
Total Protein ELP: 6.8 g/dL (ref 6.0–8.5)

## 2020-12-25 ENCOUNTER — Encounter: Payer: Self-pay | Admitting: Hematology

## 2020-12-25 NOTE — Progress Notes (Addendum)
HEMATOLOGY/ONCOLOGY CLNIC NOTE  Date of Service: .12/18/2020   Patient Care Team: [name], FNP as PCP - General (General Practice)  CHIEF COMPLAINTS/PURPOSE OF CONSULTATION:  Multiple myeloma- continued mx  HISTORY OF PRESENTING ILLNESS:   Please see previous note for details   INTERVAL HISTORY:  Alexandra [name] is here for follow-up of her multiple myeloma. She notes no acute new concerns.  Notes improving energy levels. No infection issues no fevers no chills no night sweats. No new leg swelling.  She is tolerating her maintenance Revlimid without any new acute toxicities.  Grade 1 fatigue.  Some intermittent mild diarrhea.  Grade 1 muscle cramps. No other acute new symptoms today.  Labs done 12/18/2020 reviewed.  Hemoglobin 13.3, platelets 194k, WBC count 3.4k CMP within normal limits Myeloma panel shows no monoclonal protein spike IFE shows some monoclonal free lambda light chains. Kappa lambda serum free light chain ratio within normal limits  ONCOLOGIC HISTORY   Lambda Light Chain Multiple Myeloma  A. 09/27/19: CT Maxillofacial enhancing dural-based anterior cranial fossa mass traversing the calvarium and extending into the extracalvarial soft tissues along with a 7.4 mm rightward midline shift at the level of anterior cranial fossa. MRI head with and without contrast is recommended for further evaluation. B. 10/05/19: MRI Head large extra-axial mass anterior left frontal region with bone erosion and extension into the scalp soft tissues. Considerations include hemangiopericytoma, metastasis, and aggressive hemangioma. 2.6 mm enhancing lesion in the clivus.  C. 10/10/19: Left craniectomy with frontal brain tumor/bone resection, path + plasma cell neoplasm, lambda restricted, plasmacytoma favored. Hgb 9.9,  D. 10/31/19: Initial Hem-Onc consultation; M-spike 0; IgG 627, IgQ 51, IgM 11; SFLC: Kappa 1.12 mg/dL, Lambda 154.50, ratio 0.01; IFE: serum + monoclonal  free Lambda light chain; Hgb 10.1,  E. 11/04/19: 24 hour UPEP Total protein 1318, Free lambda light chains Ur 1332.46, ratio 0.02, M-spike 27.4, M-spike 24 hr at 361; Bence Jones positive, Lambda type F. 11/08/19: BMBx 22% atypical plasma cells, 30% by IHC; FISH Dup(1q) and Del(13q) detected G. 11/10/19: PET CT no metabolic activity within the skeleton to localize active myeloma; no lytic lesions identified; no abnormal activity at the left craniectomy site; no evidence of soft tissue plasmacytoma. H. 12/05/19: Started KRD; SFLC: Kappa 1.42, Lambda 177.97, ratio 0.01; Hgb 11.0 I. 12//13/21: SFLC: Kappa 1.45, Lambda 8.92, ratio 0.16; Hgb 11.1 J. 02/06/20: SFLC: Kappa 1.10, Lambda 2.08, ratio 0.53; Hgb 11.3 K. 02/26/20: Cycle 4 of KRD; SFLC: Kappa 1.14, Lambda 1.90, ratio 0.60; Hgb 10.8 L. 04/02/20: SFLC: Kappa 1.05, Lambda 1.56, ratio 0.67; Hgb 11.2 M. 04/30/20: SFLC: Kappa 1.01, Lambda 1.40, ratio 0.72; Hgb 11.5  N. 05/05/20: MRI Brain no evidence of left frontal bone lesion recurrence; Multiple subcentimeter area of nodular enhancement in the calvarium are nonspecific but could related to myeloma given nonvisualization on prior. O. 05/11/20: PET CT no findings to suggest FDG avid lesions of multiple myeloma. P. 05/14/20: BMBx 1% plasma cells Q. 05/28/20: SFLC: Kappa 0.84, Lambda 1.16, ratio 0.72; Hgb 11.6  R. 06/11/20: EKG QT 408/QTc 424; PFT's: FEV1 110%, DLCO 58.8% predicted, 64.4% hgb corrected; ECHO: EF 55% est, 61% calc; GLS -20%; SFLC: Kappa 1.16, Lambda 1.40, ratio 0.83; Hgb 10.9-VGPR S. 07/06/20: SFLC: Kappa 1.16, Lambda 2.14, ratio 0.54; . Hgb 11.7, Plt 118 T. 6/20-7/12/22: Hospital admission for Melphalan 200 mg/m2 followed by reinfusion of 4.71 x 10^6/kg CD34+ autologous cells MEDICAL HISTORY:  Past Medical History:  Diagnosis Date   Anxiety  Hypertension    Pre-diabetes     SURGICAL HISTORY: Past Surgical History:  Procedure Laterality Date   ABLATION     APPLICATION OF CRANIAL  NAVIGATION N/A 10/10/2019   Procedure: APPLICATION OF CRANIAL NAVIGATION;  Surgeon: Consuella Lose, MD;  Location: [place];  Service: Neurosurgery;  Laterality: N/A;  APPLICATION OF CRANIAL NAVIGATION   CRANIOPLASTY Left 10/14/2019   Procedure: LEFT CRANIOPLASTY WITH PLACEMENT OF CUSTOM CRANIAL IMPLANT;  Surgeon: Consuella Lose, MD;  Location: [place];  Service: Neurosurgery;  Laterality: Left;   CRANIOTOMY Left 10/10/2019   Procedure: Left frontal Stereotactic craniectomy for resection of tumor;  Surgeon: Consuella Lose, MD;  Location: [place];  Service: Neurosurgery;  Laterality: Left;  Left frontal Stereotactic craniectomy for resection of tumor   HAMMER TOE SURGERY Left    IR IMAGING GUIDED PORT INSERTION  12/01/2019   TUBAL LIGATION      SOCIAL HISTORY: Social History   Socioeconomic History   Marital status: Single    Spouse name: Not on file   Number of children: Not on file   Years of education: Not on file   Highest education level: 12th grade  Occupational History   Not on file  Tobacco Use   Smoking status: Never   Smokeless tobacco: Never  Vaping Use   Vaping Use: Never used  Substance and Sexual Activity   Alcohol use: Yes    Comment: occassionally   Drug use: Not Currently   Sexual activity: Not on file  Other Topics Concern   Not on file  Social History Narrative   Not on file   Social Determinants of Health   Financial Resource Strain: Medium Risk   Difficulty of Paying Living Expenses: Somewhat hard  Food Insecurity: Food Insecurity Present   Worried About Charity fundraiser in the Last Year: Sometimes true   Arboriculturist in the Last Year: Never true  Transportation Needs: Unmet Transportation Needs   Lack of Transportation (Medical): No   Lack of Transportation (Non-Medical): Yes  Physical Activity: Sufficiently Active   Days of Exercise per Week: 5 days   Minutes of Exercise per Session: 30 min  Stress: No Stress Concern Present   Feeling  of Stress : Only a little  Social Connections: Moderately Integrated   Frequency of Communication with Friends and Family: More than three times a week   Frequency of Social Gatherings with Friends and Family: Once a week   Attends Religious Services: More than 4 times per year   Active Member of [employer] or Organizations: Yes   Attends Archivist Meetings: 1 to 4 times per year   Marital Status: Divorced  Human resources officer Violence: Not on file    FAMILY HISTORY: Family History  Problem Relation Age of Onset   Breast cancer Maternal Grandmother    Heart disease Mother    Hypertension Mother    Sarcoidosis Father    Diabetes Maternal Grandfather     ALLERGIES:  has No Known Allergies.  MEDICATIONS:  Current Outpatient Medications  Medication Sig Dispense Refill   atorvastatin (LIPITOR) 10 MG tablet Take 10 mg by mouth daily.     benazepril-hydrochlorthiazide (LOTENSIN HCT) 10-12.5 MG tablet Take 1 tablet by mouth daily. 90 tablet 1   docusate sodium (COLACE) 100 MG capsule Take 100 mg by mouth 2 (two) times daily.     lenalidomide (REVLIMID) 10 MG capsule TAKE 1 CAPSULE BY MOUTH EVERY DAY FOR 21 DAYS, HOLD MEDICATION FOR 7  DAYS, REPEAT CYCLE EVERY 28 DAYS 21 capsule 0   levocetirizine (XYZAL) 5 MG tablet SMARTSIG:1 Tablet(s) By Mouth Every Evening     OZEMPIC, 1 MG/DOSE, 4 MG/3ML SOPN Inject into the skin.     traZODone (DESYREL) 50 MG tablet TAKE 1 TO 2 TABLETS BY MOUTH AT BEDTIME AS NEEDED FOR SLEEP 180 tablet 1   valACYclovir (VALTREX) 500 MG tablet Take 500 mg by mouth 2 (two) times daily.     benazepril-hydrochlorthiazide (LOTENSIN HCT) 20-12.5 MG tablet Take 1 tablet by mouth daily. (Patient not taking: Reported on 12/18/2020) 30 tablet 1   valACYclovir (VALTREX) 1000 MG tablet TAKE 1 TABLET BY MOUTH EVERY DAY AS NEEDED (Patient not taking: Reported on 12/18/2020) 90 tablet 1   No current facility-administered medications for this visit.    REVIEW OF SYSTEMS:    .10 Point review of Systems was done is negative except as noted above.    PHYSICAL EXAMINATION: ECOG PERFORMANCE STATUS: 1 - Symptomatic but completely ambulatory  . Vitals:   12/18/20 1424  BP: 113/71  Pulse: 62  Resp: 20  Temp: 97.7 F (36.5 C)  SpO2: 100%    Filed Weights   12/18/20 1424  Weight: 167 lb 11.2 oz (76.1 kg)    .Body mass index is 26.27 kg/m.   GENERAL:alert, in no acute distress and comfortable SKIN: no acute rashes, no significant lesions EYES: conjunctiva are pink and non-injected, sclera anicteric OROPHARYNX: MMM, no exudates, no oropharyngeal erythema or ulceration NECK: supple, no JVD LYMPH:  no palpable lymphadenopathy in the cervical, axillary or inguinal regions LUNGS: clear to auscultation b/l with normal respiratory effort HEART: regular rate & rhythm ABDOMEN:  normoactive bowel sounds , non tender, not distended. Extremity: no pedal edema PSYCH: alert & oriented x 3 with fluent speech NEURO: no focal motor/sensory deficits   LABORATORY DATA:  I have reviewed the data as listed  CBC Latest Ref Rng & Units 12/18/2020 11/09/2020 10/16/2020  WBC 4.0 - 10.5 K/uL 3.4(L) 3.9(L) 3.8(L)  Hemoglobin 12.0 - 15.0 g/dL 13.3 12.1 11.7(L)  Hematocrit 36.0 - 46.0 % 38.4 34.8(L) 33.6(L)  Platelets 150 - 400 K/uL 194 212 205    CMP Latest Ref Rng & Units 12/18/2020 11/09/2020 11/01/2020  Glucose 70 - 99 mg/dL 78 89 92  BUN 6 - 20 mg/dL _0 Creatinine 0.44 - 1.00 mg/dL 0.74 0.95 0.84  Sodium 135 - 145 mmol/L 137 138 143  Potassium 3.5 - 5.1 mmol/L 3.8 3.6 4.0  Chloride 98 - 111 mmol/L 101 103 100  CO2 22 - 32 mmol/L _1 Calcium 8.9 - 10.3 mg/dL 9.2 9.0 9.6  Total Protein 6.5 - 8.1 g/dL 7.3 6.9 6.7  Total Bilirubin 0.3 - 1.2 mg/dL 0.7 0.6 0.5  Alkaline Phos 38 - 126 U/L 42 45 55  AST 15 - 41 U/L _2 ALT 0 - 44 U/L _3 11/08/2019 FISH Plasma Cell Myeloma Prognostic Panel:    11/08/2019 Cytogenetics:    11/08/2019  Bone Marrow Report [id number]):   10/10/2019 Surgical Pathology [id number]):   05/14/2020 Surgical Pathology "-Normocellular bone marrow for age with trilineage hematopoiesis and 1%  plasma cells  -See comment   PERIPHERAL BLOOD:  -Normocytic-normochromic anemia  -Leukopenia   COMMENT:   The bone marrow is generally normocellular for age with trilineage  hematopoiesis but with predominance of erythroid precursors.  The  myeloid changes are not considered specific and likely related to  therapy.  In this background, the plasma cells represent 1% of all cells  associated with scattered small clusters and display slight lambda light  chain excess.  The findings are limited but most suggestive of minimal  residual plasma cell neoplasm.  Correlation with cytogenetic and FISH  studies is recommended. "  RADIOGRAPHIC STUDIES:  I have personally reviewed the radiological images as listed and agreed with the findings in the report. No results found.   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 No M spike. Lambda light chain myeloma + Anemia + bone lesion on skull-- resected No renal failure, hypercalcemia  BMBx- 22-30% lambda restricted plasma cell MolCy Dup (1q), del (13q)  Completed 7 cycles of KRD  The pt recently had a transplant at [facility]. DISEASE Lambda Light Chain Multiple Myeloma  - She will receive Melphalan 200 mg/m2 conditioning regimen, followed by autologous stem cell rescue (see details below).  TRANSPLANT: Preparative regimen:  - Melphalan 200 mg/m2 (3104m) IV: 07/09/20  Stem Cells:  - Autologous stem cell reinfusion on 07/10/20. Patient received 4.71 x 10^6 CD34+/kg. - Her course was  complicated by neutropenic fevers and Bcx 6/27 positive for E coli. She received IV cefepime from 6/27 until 6/30, which cefepime was switched to zosyn in the setting of new abdominal pain. CT abdomen/pelvis was performed that showed acute uncomplicated multifocal diverticulitis involving the transverse and descending colon. She was treated with bowel rest, prn pain medication, and IV zosyn, which was continued until engraftment on 7/5 and was transitioned to ppx ceftriaxone. Unfortunately, with the transition to ceftriaxone she had recurrent fevers and as a result was restarted on zosyn. Zosyn was transitioned to oral cipro/flagyl on 7/10 with the plan to continue for a 7 day course.   3) prediabetes 4) hypertension 5) anxiety 6) dyslipidemia PLAN: -Discussed pt labwork today, .12/18/2020 Labs done 12/18/2020 reviewed.  Hemoglobin 13.3, platelets 194k, WBC count 3.4k CMP within normal limits Myeloma panel shows no monoclonal protein spike IFE shows some monoclonal free lambda light chains. Kappa lambda serum free light chain ratio within normal limits -No indication for transfusion support at this time. -No prohibitive toxicities from her maintenance Revlimid at 10 mg 3 weeks on 1 week of -Continue daily baby ASA, Valtrex, & B complex. -[facility] PET CT scan brain MRI and bone marrow biopsy at this time and we will schedule these upon her follow-up with them at the end of December or early January  -We will plan to start her on maintenance Zometa every 2 months upon her next visit in about 2 months. -She prefers to get her post transplant [facility] her up for this. -She was recommended to complete her primary series and booster for COVID-19.  FOLLOW UP: Return to clinic with Dr. KIrene Limbowith labs in 2 months Restarting  Zometa every 2 months with her next visit in 2 months  . The total time spent in the appointment was 25 minutes and more than 50% was on counseling and direct patient care, coordination of care with her transplant team, reviewing toxicities of Revlimid  ordering and setting of Zometa.   All of the patient's questions were answered with apparent satisfaction. The patient knows to call the clinic with any problems, questions or concerns.    Sullivan Lone MD MS AAHIVMS [facility] Hematology/Oncology Physician [facility]  .

## 2020-12-26 ENCOUNTER — Telehealth: Payer: Self-pay | Admitting: Hematology

## 2020-12-26 NOTE — Telephone Encounter (Signed)
Scheduled per sch msg. Called and spoke with patient. Confirmed appt  

## 2020-12-31 ENCOUNTER — Encounter: Payer: Self-pay | Admitting: Hematology

## 2020-12-31 ENCOUNTER — Other Ambulatory Visit: Payer: Self-pay

## 2020-12-31 DIAGNOSIS — C9000 Multiple myeloma not having achieved remission: Secondary | ICD-10-CM

## 2021-01-03 ENCOUNTER — Other Ambulatory Visit: Payer: Self-pay | Admitting: Hematology

## 2021-01-03 DIAGNOSIS — C9000 Multiple myeloma not having achieved remission: Secondary | ICD-10-CM

## 2021-01-04 ENCOUNTER — Encounter: Payer: Self-pay | Admitting: Hematology

## 2021-01-07 ENCOUNTER — Encounter: Payer: Self-pay | Admitting: Hematology

## 2021-01-07 ENCOUNTER — Other Ambulatory Visit: Payer: Self-pay

## 2021-01-12 ENCOUNTER — Ambulatory Visit (HOSPITAL_COMMUNITY)
Admission: RE | Admit: 2021-01-12 | Discharge: 2021-01-12 | Disposition: A | Discharge status: Home / Self Care | Payer: 59 | Source: Ambulatory Visit | Attending: Hematology | Admitting: Hematology

## 2021-01-12 DIAGNOSIS — C9000 Multiple myeloma not having achieved remission: Secondary | ICD-10-CM | POA: Diagnosis present

## 2021-01-12 MED ORDER — GADOBUTROL 1 MMOL/ML IV SOLN
7.0000 mL | Freq: Once | INTRAVENOUS | Status: AC | PRN
  Administered 2021-01-12: 10:00:00 7 mL via INTRAVENOUS

## 2021-01-16 ENCOUNTER — Other Ambulatory Visit: Payer: Self-pay

## 2021-01-16 ENCOUNTER — Ambulatory Visit (HOSPITAL_COMMUNITY): Payer: 59

## 2021-01-16 ENCOUNTER — Ambulatory Visit (HOSPITAL_COMMUNITY)
Admission: RE | Admit: 2021-01-16 | Discharge: 2021-01-16 | Disposition: A | Discharge status: Home / Self Care | Payer: 59 | Source: Ambulatory Visit | Attending: Hematology | Admitting: Hematology

## 2021-01-16 DIAGNOSIS — C9000 Multiple myeloma not having achieved remission: Secondary | ICD-10-CM | POA: Diagnosis not present

## 2021-01-16 LAB — GLUCOSE, CAPILLARY: Glucose-Capillary: 90 mg/dL (ref 70–99)

## 2021-01-16 MED ORDER — FLUDEOXYGLUCOSE F - 18 (FDG) INJECTION
8.4000 | Freq: Once | INTRAVENOUS | Status: AC
  Administered 2021-01-16: 07:00:00 8.1 via INTRAVENOUS

## 2021-01-17 ENCOUNTER — Inpatient Hospital Stay: Payer: 59 | Attending: Hematology

## 2021-01-17 ENCOUNTER — Other Ambulatory Visit: Payer: Self-pay | Admitting: *Deleted

## 2021-01-17 ENCOUNTER — Other Ambulatory Visit (HOSPITAL_COMMUNITY): Payer: Self-pay | Admitting: Physician Assistant

## 2021-01-17 ENCOUNTER — Encounter: Payer: Self-pay | Admitting: Hematology

## 2021-01-17 DIAGNOSIS — C9001 Multiple myeloma in remission: Secondary | ICD-10-CM | POA: Diagnosis present

## 2021-01-17 DIAGNOSIS — C9000 Multiple myeloma not having achieved remission: Secondary | ICD-10-CM

## 2021-01-17 LAB — CBC WITH DIFFERENTIAL (CANCER CENTER ONLY)
Abs Immature Granulocytes: 0.01 10*3/uL (ref 0.00–0.07)
Basophils Absolute: 0 10*3/uL (ref 0.0–0.1)
Basophils Relative: 1 %
Eosinophils Absolute: 0.2 10*3/uL (ref 0.0–0.5)
Eosinophils Relative: 7 %
HCT: 35.6 % — ABNORMAL LOW (ref 36.0–46.0)
Hemoglobin: 12.2 g/dL (ref 12.0–15.0)
Immature Granulocytes: 0 %
Lymphocytes Relative: 39 %
Lymphs Abs: 1.4 10*3/uL (ref 0.7–4.0)
MCH: 29.2 pg (ref 26.0–34.0)
MCHC: 34.3 g/dL (ref 30.0–36.0)
MCV: 85.2 fL (ref 80.0–100.0)
Monocytes Absolute: 0.3 10*3/uL (ref 0.1–1.0)
Monocytes Relative: 9 %
Neutro Abs: 1.6 10*3/uL — ABNORMAL LOW (ref 1.7–7.7)
Neutrophils Relative %: 44 %
Platelet Count: 267 10*3/uL (ref 150–400)
RBC: 4.18 MIL/uL (ref 3.87–5.11)
RDW: 15.2 % (ref 11.5–15.5)
WBC Count: 3.5 10*3/uL — ABNORMAL LOW (ref 4.0–10.5)
nRBC: 0 % (ref 0.0–0.2)

## 2021-01-17 LAB — CMP (CANCER CENTER ONLY)
ALT: 13 U/L (ref 0–44)
AST: 12 U/L — ABNORMAL LOW (ref 15–41)
Albumin: 4.2 g/dL (ref 3.5–5.0)
Alkaline Phosphatase: 34 U/L — ABNORMAL LOW (ref 38–126)
Anion gap: 7 (ref 5–15)
BUN: 11 mg/dL (ref 6–20)
CO2: 26 mmol/L (ref 22–32)
Calcium: 9.3 mg/dL (ref 8.9–10.3)
Chloride: 105 mmol/L (ref 98–111)
Creatinine: 0.69 mg/dL (ref 0.44–1.00)
GFR, Estimated: >60 mL/min (ref >60)
Glucose, Bld: 89 mg/dL (ref 70–99)
Potassium: 3.8 mmol/L (ref 3.5–5.1)
Sodium: 138 mmol/L (ref 135–145)
Total Bilirubin: 0.6 mg/dL (ref 0.3–1.2)
Total Protein: 6.8 g/dL (ref 6.5–8.1)

## 2021-01-18 ENCOUNTER — Ambulatory Visit (HOSPITAL_COMMUNITY)
Admission: RE | Admit: 2021-01-18 | Discharge: 2021-01-18 | Disposition: A | Discharge status: Home / Self Care | Payer: 59 | Source: Ambulatory Visit | Attending: Hematology | Admitting: Hematology

## 2021-01-18 ENCOUNTER — Other Ambulatory Visit: Payer: Self-pay

## 2021-01-18 ENCOUNTER — Encounter (HOSPITAL_COMMUNITY): Payer: Self-pay

## 2021-01-18 DIAGNOSIS — C9000 Multiple myeloma not having achieved remission: Secondary | ICD-10-CM | POA: Diagnosis present

## 2021-01-18 LAB — CBC WITH DIFFERENTIAL/PLATELET
Abs Immature Granulocytes: 0.01 10*3/uL (ref 0.00–0.07)
Basophils Absolute: 0 10*3/uL (ref 0.0–0.1)
Basophils Relative: 1 %
Eosinophils Absolute: 0.2 10*3/uL (ref 0.0–0.5)
Eosinophils Relative: 6 %
HCT: 36.5 % (ref 36.0–46.0)
Hemoglobin: 12.3 g/dL (ref 12.0–15.0)
Immature Granulocytes: 0 %
Lymphocytes Relative: 36 %
Lymphs Abs: 1.2 10*3/uL (ref 0.7–4.0)
MCH: 29.4 pg (ref 26.0–34.0)
MCHC: 33.7 g/dL (ref 30.0–36.0)
MCV: 87.3 fL (ref 80.0–100.0)
Monocytes Absolute: 0.3 10*3/uL (ref 0.1–1.0)
Monocytes Relative: 10 %
Neutro Abs: 1.6 10*3/uL — ABNORMAL LOW (ref 1.7–7.7)
Neutrophils Relative %: 47 %
Platelets: 233 10*3/uL (ref 150–400)
RBC: 4.18 MIL/uL (ref 3.87–5.11)
RDW: 15.3 % (ref 11.5–15.5)
WBC: 3.3 10*3/uL — ABNORMAL LOW (ref 4.0–10.5)
nRBC: 0 % (ref 0.0–0.2)

## 2021-01-18 LAB — KAPPA/LAMBDA LIGHT CHAINS
Kappa free light chain: 24.4 mg/L — ABNORMAL HIGH (ref 3.3–19.4)
Kappa, lambda light chain ratio: 1.18 (ref 0.26–1.65)
Lambda free light chains: 20.7 mg/L (ref 5.7–26.3)

## 2021-01-18 MED ORDER — FENTANYL CITRATE (PF) 100 MCG/2ML IJ SOLN
INTRAMUSCULAR | Status: AC
  Filled 2021-01-18: qty 4

## 2021-01-18 MED ORDER — HYDROCODONE-ACETAMINOPHEN 5-325 MG PO TABS: 1.0000 | ORAL_TABLET | ORAL | Status: DC | PRN

## 2021-01-18 MED ORDER — SODIUM CHLORIDE 0.9 % IV SOLN: INTRAVENOUS | Status: DC

## 2021-01-18 MED ORDER — MIDAZOLAM HCL 2 MG/2ML IJ SOLN
INTRAMUSCULAR | Status: AC | PRN
  Administered 2021-01-18: 1 mg via INTRAVENOUS

## 2021-01-18 MED ORDER — MIDAZOLAM HCL 2 MG/2ML IJ SOLN
INTRAMUSCULAR | Status: AC
  Filled 2021-01-18: qty 4

## 2021-01-18 MED ORDER — FENTANYL CITRATE (PF) 100 MCG/2ML IJ SOLN
INTRAMUSCULAR | Status: AC | PRN
  Administered 2021-01-18: 50 ug via INTRAVENOUS

## 2021-01-18 MED ORDER — LIDOCAINE HCL (PF) 1 % IJ SOLN
INTRAMUSCULAR | Status: AC | PRN
  Administered 2021-01-18: 10 mL via INTRADERMAL

## 2021-01-18 NOTE — H&P (Signed)
Chief Complaint: Patient was seen in consultation today for BMBx at the request of Brunetta Genera  Referring Physician(s): Brunetta Genera  Supervising Physician: Arne Cleveland  Patient Status: [facility] - Out-pt  History of Present Illness: [name] is a 55 y.o. 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. Patient has been followed by Dr. Irene Limbo, a repeat bone marrow biopsy and aspiration was recommended to the patient to determine consolidation/maintenance regimen based on response.   IR was requested for image guided bone marrow biopsy and aspiration with MRD (minimal residua disease testing).  Patient laying in bed, not in acute distress.  Denise headache, fever, chills, shortness of breath, cough, chest pain, abdominal pain, nausea ,vomiting, and bleeding.    Past Medical History:  Diagnosis Date   Anxiety    Hypertension    Pre-diabetes     Past Surgical History:  Procedure Laterality Date   ABLATION     APPLICATION OF CRANIAL NAVIGATION N/A 10/10/2019   Procedure: APPLICATION OF CRANIAL NAVIGATION;  Surgeon: Consuella Lose, MD;  Location: [name];  Service: Neurosurgery;  Laterality: N/A;  APPLICATION OF CRANIAL NAVIGATION   CRANIOPLASTY Left 10/14/2019   Procedure: LEFT CRANIOPLASTY WITH PLACEMENT OF CUSTOM CRANIAL IMPLANT;  Surgeon: Consuella Lose, MD;  Location: [place];  Service: Neurosurgery;  Laterality: Left;   CRANIOTOMY Left 10/10/2019   Procedure: Left frontal Stereotactic craniectomy for resection of tumor;  Surgeon: Consuella Lose, MD;  Location: [place];  Service: Neurosurgery;  Laterality: Left;  Left frontal Stereotactic craniectomy for resection of tumor   HAMMER TOE SURGERY Left    IR IMAGING GUIDED PORT INSERTION  12/01/2019   TUBAL LIGATION      Allergies: Patient has no known allergies.  Medications: Prior to Admission  medications   Medication Sig Start Date End Date Taking? Authorizing Provider  atorvastatin (LIPITOR) 10 MG tablet Take 10 mg by mouth daily.    [provider]  benazepril-hydrochlorthiazide (LOTENSIN HCT) 10-12.5 MG tablet Take 1 tablet by mouth daily. 10/17/20   [name], FNP  benazepril-hydrochlorthiazide (LOTENSIN HCT) 20-12.5 MG tablet Take 1 tablet by mouth daily. Patient not taking: Reported on 12/18/2020 10/15/20 10/15/21  [name], FNP  docusate sodium (COLACE) 100 MG capsule Take 100 mg by mouth 2 (two) times daily.    [provider]  lenalidomide (REVLIMID) 10 MG capsule TAKE 1 CAPSULE BY MOUTH EVERY DAY FOR 21 DAYS THEN HOLD MEDICATION FOR 7 DAYS. REPEAT CYCLE EVERY 28 DAYS. 01/07/21   Brunetta Genera, MD  levocetirizine (XYZAL) 5 MG tablet SMARTSIG:1 Tablet(s) By Mouth Every Evening 09/26/20   [provider]  OZEMPIC, 1 MG/DOSE, 4 MG/3ML SOPN Inject into the skin. 12/17/20   [provider]  traZODone (DESYREL) 50 MG tablet TAKE 1 TO 2 TABLETS BY MOUTH AT BEDTIME AS NEEDED FOR SLEEP 09/17/20   Brunetta Genera, MD  valACYclovir (VALTREX) 1000 MG tablet TAKE 1 TABLET BY MOUTH EVERY DAY AS NEEDED Patient not taking: Reported on 12/18/2020 09/18/20   [name], FNP  valACYclovir (VALTREX) 500 MG tablet Take 500 mg by mouth 2 (two) times daily. 07/06/20   [provider]     Family History  Problem Relation Age of Onset   Breast cancer Maternal Grandmother    Heart disease Mother    Hypertension Mother    Sarcoidosis Father    Diabetes Maternal Grandfather  Social History   Socioeconomic History   Marital status: Single    Spouse name: Not on file   Number of children: Not on file   Years of education: Not on file   Highest education level: 12th grade  Occupational History   Not on file  Tobacco Use   Smoking status: Never   Smokeless tobacco: Never  Vaping Use   Vaping Use: Never used  Substance and  Sexual Activity   Alcohol use: Yes    Comment: occassionally   Drug use: Not Currently   Sexual activity: Not on file  Other Topics Concern   Not on file  Social History Narrative   Not on file   Social Determinants of Health   Financial Resource Strain: Medium Risk   Difficulty of Paying Living Expenses: Somewhat hard  Food Insecurity: Food Insecurity Present   Worried About Running Out of Food in the Last Year: Sometimes true   Ran Out of Food in the Last Year: Never true  Transportation Needs: Unmet Transportation Needs   Lack of Transportation (Medical): No   Lack of Transportation (Non-Medical): Yes  Physical Activity: Sufficiently Active   Days of Exercise per Week: 5 days   Minutes of Exercise per Session: 30 min  Stress: No Stress Concern Present   Feeling of Stress : Only a little  Social Connections: Moderately Integrated   Frequency of Communication with Friends and Family: More than three times a week   Frequency of Social Gatherings with Friends and Family: Once a week   Attends Religious Services: More than 4 times per year   Active Member of [employer] or Organizations: Yes   Attends Archivist Meetings: 1 to 4 times per year   Marital Status: Divorced     Review of Systems: A 12 point ROS discussed and pertinent positives are indicated in the HPI above.  All other systems are negative.  Vital Signs: BP 116/80    Pulse 64    Temp 97.8 F (36.6 C) (Oral)    Resp 18    Wt 165 lb (74.8 kg)    LMP 07/09/2015    SpO2 99%    BMI 25.84 kg/m   Physical Exam Vitals and nursing note reviewed.  Constitutional:      General: Patient is not in acute distress.    Appearance: Normal appearance. Patient is not ill-appearing.  HENT:     Head: Normocephalic and atraumatic.     Mouth/Throat:     Mouth: Mucous membranes are moist.     Pharynx: Oropharynx is clear.  Cardiovascular:     Rate and Rhythm: Normal rate and regular rhythm.     Pulses: Normal pulses.      Heart sounds: Normal heart sounds.  Pulmonary:     Effort: Pulmonary effort is normal.     Breath sounds: Normal breath sounds.  Abdominal:     General: Abdomen is flat. Bowel sounds are normal.     Palpations: Abdomen is soft.  Musculoskeletal:     Cervical back: Neck supple.  Skin:    General: Skin is warm and dry.     Coloration: Skin is not jaundiced or pale.  Neurological:     Mental Status: Patient is alert and oriented to person, place, and time.  Psychiatric:        Mood and Affect: Mood normal.        Behavior: Behavior normal.        Judgment: Judgment normal.  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 Left frontal bone tumor resected. EXAM: MRI HEAD WITHOUT AND WITH CONTRAST TECHNIQUE: Multiplanar, multiecho pulse sequences of the brain and surrounding structures were obtained without and with intravenous contrast. CONTRAST:  79m GADAVIST GADOBUTROL 1 MMOL/ML IV SOLN COMPARISON:  Brain MRI 05/05/2020 and earlier. FINDINGS: Brain: Stable cerebral volume. No restricted diffusion to suggest acute infarction. No midline shift, mass effect, evidence of mass lesion, ventriculomegaly, extra-axial collection or acute intracranial hemorrhage. Cervicomedullary junction and pituitary are within normal limits. Stable gray and white matter signal. Scattered nonspecific cerebral white matter T2 and FLAIR hyperintensity is stable and mild to moderate for age. More moderately advanced patchy T2 heterogeneity in the pons is stable. No cortical encephalomalacia or chronic cerebral blood products identified. No abnormal intracranial enhancement identified. Stable mild dural thickening along the margins of the anterior frontal bone resection since April. No masslike or suspicious  areas. No dural thickening elsewhere. Vascular: Major intracranial vascular flow voids are stable. The major dural venous sinuses are enhancing and appear to be patent. Skull and upper cervical spine: Sequelae of anterior frontal bone resection, cranioplasty. Stable postoperative appearance since April. Chronic scattered small FLAIR hyperintense and enhancing skull lesions (such as the 4-5 mm lesions on series 16 images 108 and 119) are stable since April but indeterminate, more conspicuous than in 2021. However, a similar enhancing lesion of the right clivus in 2021 has resolved. And other bone marrow signal at the visible skull base, face and cervical spine appears normal. Negative for age visible cervical spine. Sinuses/Orbits: Stable, negative. Other: Mastoids remain clear. Visible internal auditory structures appear normal. Postoperative scalp soft tissues remain within normal limits. IMPRESSION: 1. Stable and satisfactory appearance of anterior frontal bone resection, cranioplasty. 2. Scattered subcentimeter calvarium enhancing lesions are stable but suspicious for myeloma. Still, a larger enhancing lesion at the clivus seen in 2021 remains resolved. 3. No evidence of metastatic disease to the brain. Stable moderate for age nonspecific signal changes in the white matter and pons, likely small vessel disease related. Electronically Signed   By: HGenevie AnnM.D.   On: 01/13/2021 04:42   NM PET Image Restage (PS) Whole Body  Result Date: 01/16/2021 CLINICAL DATA:  Subsequent treatment strategy for multiple myeloma. EXAM: NUCLEAR MEDICINE PET WHOLE BODY TECHNIQUE: 8.1 mCi F-18 FDG was injected intravenously. Full-ring PET imaging was performed from the head to foot after the radiotracer. CT data was obtained and used for attenuation correction and anatomic localization. Fasting blood glucose: 90 mg/dl COMPARISON:  05/11/2020 FINDINGS: Mediastinal blood pool activity: SUV max 3.14 HEAD/NECK: No tracer avid lesions  identified within the calvarium. Status post left frontal craniectomy. No tracer avid lymph node or mass within the soft tissues of the neck. Incidental CT findings: none CHEST: No hypermetabolic mediastinal or hilar nodes. No suspicious pulmonary nodules on the CT scan. Incidental CT findings: none ABDOMEN/PELVIS: No abnormal hypermetabolic activity within the liver, pancreas, adrenal glands, or spleen. No hypermetabolic lymph nodes in the abdomen or pelvis. Incidental CT findings: Mild aortic atherosclerotic calcification noted. Status post bilateral tubal ligation. SKELETON: No focal hypermetabolic activity to suggest skeletal metastasis. Incidental CT findings: No suspicious lesions of multiple myeloma identified. EXTREMITIES: No abnormal hypermetabolic activity in the lower extremities. Incidental CT findings: none IMPRESSION: 1. No abnormal foci of increased  uptake within the skeleton to suggest metabolically active multiple myeloma. 2. No suspicious bone lesions identified on the CT portion of the exam. 3. No tracer avid lymph node or mass identified to suggest soft tissue plasmacytoma Electronically Signed   By: Kerby Moors M.D.   On: 01/16/2021 15:53    Labs:  CBC: Recent Labs    11/09/20 0832 12/18/20 1338 01/17/21 1622 01/18/21 0944  WBC 3.9* 3.4* 3.5* 3.3*  HGB 12.1 13.3 12.2 12.3  HCT 34.8* 38.4 35.6* 36.5  PLT 212 194 267 233    COAGS: Recent Labs    09/02/20 1735  INR 1.0  APTT 35    BMP: Recent Labs    10/16/20 1449 11/01/20 0936 11/09/20 0832 12/18/20 1338 01/17/21 1622  NA 140 143 138 137 138  K 3.8 4.0 3.6 3.8 3.8  CL 108 100 103 101 105  CO2 23 25 29 25 26   GLUCOSE 108* 92 89 78 89  BUN 17 14 14 12 11   CALCIUM 9.5 9.6 9.0 9.2 9.3  CREATININE 0.78 0.84 0.95 0.74 0.69  GFRNONAA >60  --  >60 >60 >60    LIVER FUNCTION TESTS: Recent Labs    11/01/20 0936 11/09/20 0832 12/18/20 1338 01/17/21 1622  BILITOT 0.5 0.6 0.7 0.6  AST 12 20 16  12*  ALT 9  16 14 13   ALKPHOS 55 45 42 34*  PROT 6.7 6.9 7.3 6.8  ALBUMIN 4.9 4.3 4.5 4.2    TUMOR MARKERS: No results for input(s): AFPTM, CEA, CA199, CHROMGRNA in the last 8760 hours.  Assessment and Plan: 55 y.o. female with hx of multiple myeloma s/p chemo and  transplant at [facility], who is in need of repeat BMBx to determine consolidation/maintenance regimen based on response.  Patient presents to [facility] IR today for the BMBx.  NPO since MN  VSS CBC stable  Not on AC/AP   Risks and benefits of BMBx was discussed with the patient and/or patient's family including, but not limited to bleeding, infection, damage to adjacent structures or low yield requiring additional tests.  All of the questions were answered and there is agreement to proceed.  Consent signed and in chart.   Thank you for this interesting consult.  I greatly enjoyed meeting Fate Galanti and look forward to participating in their care.  A copy of this report was sent to the requesting provider on this date.  Electronically Signed: Tera Mater, PA-C 01/18/2021, 10:33 AM   I spent a total of    25 Minutes in face to face in clinical consultation, greater than 50% of which was counseling/coordinating care for BMBx  This chart was dictated using voice recognition software.  Despite best efforts to proofread,  errors can occur which can change the documentation meaning.

## 2021-01-18 NOTE — Discharge Instructions (Signed)
Please call Interventional Radiology clinic [phone] with any questions or concerns. ° °You may remove your dressing and shower tomorrow. ° ° °Bone Marrow Aspiration and Bone Marrow Biopsy, Adult, Care After °This sheet gives you information about how to care for yourself after your procedure. Your health care provider may also give you more specific instructions. If you have problems or questions, contact your health care provider. °What can I expect after the procedure? °After the procedure, it is common to have: °Mild pain and tenderness. °Swelling. °Bruising. °Follow these instructions at home: °Puncture site care °Follow instructions from your health care provider about how to take care of the puncture site. Make sure you: °Wash your hands with soap and water before and after you change your bandage (dressing). If soap and water are not available, use hand sanitizer. °Change your dressing as told by your health care provider. °Check your puncture site every day for signs of infection. Check for: °More redness, swelling, or pain. °Fluid or blood. °Warmth. °Pus or a bad smell.   °Activity °Return to your normal activities as told by your health care provider. Ask your health care provider what activities are safe for you. °Do not lift anything that is heavier than 10 lb (4.5 kg), or the limit that you are told, until your health care provider says that it is safe. °Do not drive for 24 hours if you were given a sedative during your procedure. °General instructions °Take over-the-counter and prescription medicines only as told by your health care provider. °Do not take baths, swim, or use a hot tub until your health care provider approves. Ask your health care provider if you may take showers. You may only be allowed to take sponge baths. °If directed, put ice on the affected area. To do this: °Put ice in a plastic bag. °Place a towel between your skin and the bag. °Leave the ice on for 20 minutes, 2-3 times a  day. °Keep all follow-up visits as told by your health care provider. This is important.   °Contact a health care provider if: °Your pain is not controlled with medicine. °You have a fever. °You have more redness, swelling, or pain around the puncture site. °You have fluid or blood coming from the puncture site. °Your puncture site feels warm to the touch. °You have pus or a bad smell coming from the puncture site. °Summary °After the procedure, it is common to have mild pain, tenderness, swelling, and bruising. °Follow instructions from your health care provider about how to take care of the puncture site and what activities are safe for you. °Take over-the-counter and prescription medicines only as told by your health care provider. °Contact a health care provider if you have any signs of infection, such as fluid or blood coming from the puncture site. °This information is not intended to replace advice given to you by your health care provider. Make sure you discuss any questions you have with your health care provider. °Document Revised: 05/25/2018 Document Reviewed: 05/25/2018 °Elsevier Patient Education © 2021 [employer] Inc. ° ° °Moderate Conscious Sedation, Adult, Care After °This sheet gives you information about how to care for yourself after your procedure. Your health care provider may also give you more specific instructions. If you have problems or questions, contact your health care provider. °What can I expect after the procedure? °After the procedure, it is common to have: °Sleepiness for several hours. °Impaired judgment for several hours. °Difficulty with balance. °Vomiting if you eat too   soon. °Follow these instructions at home: °For the time period you were told by your health care provider: °Rest. °Do not participate in activities where you could fall or become injured. °Do not drive or use machinery. °Do not drink alcohol. °Do not take sleeping pills or medicines that cause drowsiness. °Do not  make important decisions or sign legal documents. °Do not take care of children on your own.  °  °  °Eating and drinking °Follow the diet recommended by your health care provider. °Drink enough fluid to keep your urine pale yellow. °If you vomit: °Drink water, juice, or soup when you can drink without vomiting. °Make sure you have little or no nausea before eating solid foods.   °General instructions °Take over-the-counter and prescription medicines only as told by your health care provider. °Have a responsible adult stay with you for the time you are told. It is important to have someone help care for you until you are awake and alert. °Do not smoke. °Keep all follow-up visits as told by your health care provider. This is important. °Contact a health care provider if: °You are still sleepy or having trouble with balance after 24 hours. °You feel light-headed. °You keep feeling nauseous or you keep vomiting. °You develop a rash. °You have a fever. °You have redness or swelling around the IV site. °Get help right away if: °You have trouble breathing. °You have new-onset confusion at home. °Summary °After the procedure, it is common to feel sleepy, have impaired judgment, or feel nauseous if you eat too soon. °Rest after you get home. Know the things you should not do after the procedure. °Follow the diet recommended by your health care provider and drink enough fluid to keep your urine pale yellow. °Get help right away if you have trouble breathing or new-onset confusion at home. °This information is not intended to replace advice given to you by your health care provider. Make sure you discuss any questions you have with your health care provider. °Document Revised: 05/06/2019 Document Reviewed: 12/02/2018 °Elsevier Patient Education © 2021 [employer] Inc.  °

## 2021-01-22 ENCOUNTER — Encounter: Payer: Self-pay | Admitting: Hematology

## 2021-01-22 ENCOUNTER — Other Ambulatory Visit (HOSPITAL_COMMUNITY): Payer: Self-pay

## 2021-01-22 LAB — MULTIPLE MYELOMA PANEL, SERUM
Albumin SerPl Elph-Mcnc: 3.7 g/dL (ref 2.9–4.4)
Albumin/Glob SerPl: 1.5 (ref 0.7–1.7)
Alpha 1: 0.2 g/dL (ref 0.0–0.4)
Alpha2 Glob SerPl Elph-Mcnc: 0.6 g/dL (ref 0.4–1.0)
B-Globulin SerPl Elph-Mcnc: 0.9 g/dL (ref 0.7–1.3)
Gamma Glob SerPl Elph-Mcnc: 0.8 g/dL (ref 0.4–1.8)
Globulin, Total: 2.5 g/dL (ref 2.2–3.9)
IgA: 76 mg/dL — ABNORMAL LOW (ref 87–352)
IgG (Immunoglobin G), Serum: 809 mg/dL (ref 586–1602)
IgM (Immunoglobulin M), Srm: 18 mg/dL — ABNORMAL LOW (ref 26–217)
Total Protein ELP: 6.2 g/dL (ref 6.0–8.5)

## 2021-01-24 ENCOUNTER — Other Ambulatory Visit: Payer: Self-pay | Admitting: Hematology

## 2021-01-24 DIAGNOSIS — C9000 Multiple myeloma not having achieved remission: Secondary | ICD-10-CM

## 2021-01-24 LAB — UPEP/UIFE/LIGHT CHAINS/TP, 24-HR UR
% BETA, Urine: 0 %
ALPHA 1 URINE: 0 %
Albumin, U: 100 %
Alpha 2, Urine: 0 %
Free Kappa Lt Chains,Ur: 17.46 mg/L (ref 1.17–86.46)
Free Kappa/Lambda Ratio: 7.16 (ref 1.83–14.26)
Free Lambda Lt Chains,Ur: 2.44 mg/L (ref 0.27–15.21)
GAMMA GLOBULIN URINE: 0 %
Total Protein, Urine-Ur/day: 140 mg/24 hr (ref 30–150)
Total Protein, Urine: 6.2 mg/dL
Total Volume: 2250

## 2021-01-25 ENCOUNTER — Other Ambulatory Visit: Payer: Self-pay | Admitting: Hematology

## 2021-01-25 DIAGNOSIS — C9000 Multiple myeloma not having achieved remission: Secondary | ICD-10-CM

## 2021-01-28 ENCOUNTER — Encounter (HOSPITAL_COMMUNITY): Payer: Self-pay | Admitting: Hematology

## 2021-01-29 ENCOUNTER — Encounter: Payer: Self-pay | Admitting: Hematology

## 2021-01-29 ENCOUNTER — Other Ambulatory Visit: Payer: Self-pay

## 2021-02-06 LAB — SURGICAL PATHOLOGY

## 2021-02-07 ENCOUNTER — Other Ambulatory Visit: Payer: Self-pay

## 2021-02-07 DIAGNOSIS — C9000 Multiple myeloma not having achieved remission: Secondary | ICD-10-CM

## 2021-02-07 MED ORDER — LENALIDOMIDE 10 MG PO CAPS: ORAL_CAPSULE | ORAL | 0 refills | Status: DC

## 2021-02-19 ENCOUNTER — Encounter: Payer: 59 | Admitting: Nurse Practitioner

## 2021-02-19 ENCOUNTER — Other Ambulatory Visit (HOSPITAL_COMMUNITY): Payer: Self-pay

## 2021-02-19 MED ORDER — VALACYCLOVIR HCL 500 MG PO TABS
ORAL_TABLET | ORAL | 10 refills | Status: DC
  Filled 2021-06-21: qty 60, 30d supply, fill #0
  Filled 2021-08-06: qty 60, 30d supply, fill #1

## 2021-03-01 ENCOUNTER — Other Ambulatory Visit (HOSPITAL_COMMUNITY): Payer: Self-pay

## 2021-03-04 ENCOUNTER — Other Ambulatory Visit: Payer: Self-pay | Admitting: Nurse Practitioner

## 2021-03-04 ENCOUNTER — Other Ambulatory Visit: Payer: Self-pay

## 2021-03-04 ENCOUNTER — Other Ambulatory Visit (HOSPITAL_COMMUNITY): Payer: Self-pay

## 2021-03-04 ENCOUNTER — Encounter: Payer: Self-pay | Admitting: Hematology

## 2021-03-04 DIAGNOSIS — R7303 Prediabetes: Secondary | ICD-10-CM

## 2021-03-04 DIAGNOSIS — C9000 Multiple myeloma not having achieved remission: Secondary | ICD-10-CM

## 2021-03-04 MED ORDER — OZEMPIC (0.25 OR 0.5 MG/DOSE) 2 MG/1.5ML ~~LOC~~ SOPN
0.5000 mg | PEN_INJECTOR | SUBCUTANEOUS | 1 refills | Status: DC
  Filled 2021-03-04: qty 4.5, 84d supply, fill #0

## 2021-03-04 MED ORDER — OZEMPIC (1 MG/DOSE) 4 MG/3ML ~~LOC~~ SOPN
PEN_INJECTOR | SUBCUTANEOUS | 1 refills | Status: DC
  Filled 2021-03-04: qty 9, 84d supply, fill #0

## 2021-03-04 MED ORDER — ATORVASTATIN CALCIUM 10 MG PO TABS
10.0000 mg | ORAL_TABLET | Freq: Every day | ORAL | 1 refills | Status: DC
  Filled 2021-03-04: qty 30, 30d supply, fill #0
  Filled 2021-04-28: qty 30, 30d supply, fill #1

## 2021-03-05 ENCOUNTER — Encounter: Payer: Self-pay | Admitting: Hematology

## 2021-03-05 ENCOUNTER — Other Ambulatory Visit (HOSPITAL_COMMUNITY): Payer: Self-pay

## 2021-03-05 ENCOUNTER — Encounter: Payer: Self-pay | Admitting: Nurse Practitioner

## 2021-03-06 ENCOUNTER — Other Ambulatory Visit (HOSPITAL_COMMUNITY): Payer: Self-pay

## 2021-03-06 ENCOUNTER — Other Ambulatory Visit: Payer: Self-pay | Admitting: *Deleted

## 2021-03-06 DIAGNOSIS — C9000 Multiple myeloma not having achieved remission: Secondary | ICD-10-CM

## 2021-03-06 DIAGNOSIS — D649 Anemia, unspecified: Secondary | ICD-10-CM

## 2021-03-07 ENCOUNTER — Other Ambulatory Visit (HOSPITAL_COMMUNITY): Payer: Self-pay

## 2021-03-07 ENCOUNTER — Inpatient Hospital Stay (HOSPITAL_BASED_OUTPATIENT_CLINIC_OR_DEPARTMENT_OTHER): Payer: 59 | Admitting: Hematology

## 2021-03-07 ENCOUNTER — Inpatient Hospital Stay: Payer: 59

## 2021-03-07 ENCOUNTER — Other Ambulatory Visit: Payer: Self-pay

## 2021-03-07 ENCOUNTER — Inpatient Hospital Stay: Payer: 59 | Attending: Hematology

## 2021-03-07 VITALS — BP 140/92 | HR 66 | Temp 98.3°F | Resp 18 | Ht 67.0 in | Wt 160.3 lb

## 2021-03-07 DIAGNOSIS — C9000 Multiple myeloma not having achieved remission: Secondary | ICD-10-CM

## 2021-03-07 DIAGNOSIS — Z95828 Presence of other vascular implants and grafts: Secondary | ICD-10-CM

## 2021-03-07 DIAGNOSIS — C9001 Multiple myeloma in remission: Secondary | ICD-10-CM | POA: Diagnosis not present

## 2021-03-07 DIAGNOSIS — Z7189 Other specified counseling: Secondary | ICD-10-CM

## 2021-03-07 DIAGNOSIS — C7951 Secondary malignant neoplasm of bone: Secondary | ICD-10-CM

## 2021-03-07 DIAGNOSIS — D649 Anemia, unspecified: Secondary | ICD-10-CM | POA: Diagnosis not present

## 2021-03-07 DIAGNOSIS — Z5111 Encounter for antineoplastic chemotherapy: Secondary | ICD-10-CM

## 2021-03-07 LAB — CMP (CANCER CENTER ONLY)
ALT: 11 U/L (ref 0–44)
AST: 14 U/L — ABNORMAL LOW (ref 15–41)
Albumin: 4.2 g/dL (ref 3.5–5.0)
Alkaline Phosphatase: 43 U/L (ref 38–126)
Anion gap: 6 (ref 5–15)
BUN: 9 mg/dL (ref 6–20)
CO2: 27 mmol/L (ref 22–32)
Calcium: 9.2 mg/dL (ref 8.9–10.3)
Chloride: 104 mmol/L (ref 98–111)
Creatinine: 0.82 mg/dL (ref 0.44–1.00)
GFR, Estimated: >60 mL/min (ref >60)
Glucose, Bld: 82 mg/dL (ref 70–99)
Potassium: 3.8 mmol/L (ref 3.5–5.1)
Sodium: 137 mmol/L (ref 135–145)
Total Bilirubin: 0.4 mg/dL (ref 0.3–1.2)
Total Protein: 7.1 g/dL (ref 6.5–8.1)

## 2021-03-07 LAB — CBC WITH DIFFERENTIAL (CANCER CENTER ONLY)
Abs Immature Granulocytes: 0 10*3/uL (ref 0.00–0.07)
Basophils Absolute: 0 10*3/uL (ref 0.0–0.1)
Basophils Relative: 1 %
Eosinophils Absolute: 0.1 10*3/uL (ref 0.0–0.5)
Eosinophils Relative: 3 %
HCT: 34.2 % — ABNORMAL LOW (ref 36.0–46.0)
Hemoglobin: 11.7 g/dL — ABNORMAL LOW (ref 12.0–15.0)
Immature Granulocytes: 0 %
Lymphocytes Relative: 35 %
Lymphs Abs: 1.1 10*3/uL (ref 0.7–4.0)
MCH: 29.7 pg (ref 26.0–34.0)
MCHC: 34.2 g/dL (ref 30.0–36.0)
MCV: 86.8 fL (ref 80.0–100.0)
Monocytes Absolute: 0.4 10*3/uL (ref 0.1–1.0)
Monocytes Relative: 12 %
Neutro Abs: 1.5 10*3/uL — ABNORMAL LOW (ref 1.7–7.7)
Neutrophils Relative %: 49 %
Platelet Count: 270 10*3/uL (ref 150–400)
RBC: 3.94 MIL/uL (ref 3.87–5.11)
RDW: 15.6 % — ABNORMAL HIGH (ref 11.5–15.5)
WBC Count: 3.1 10*3/uL — ABNORMAL LOW (ref 4.0–10.5)
nRBC: 0 % (ref 0.0–0.2)

## 2021-03-07 MED ORDER — SODIUM CHLORIDE 0.9 % IV SOLN: Freq: Once | INTRAVENOUS | Status: AC

## 2021-03-07 MED ORDER — ZOLEDRONIC ACID 4 MG/100ML IV SOLN
4.0000 mg | Freq: Once | INTRAVENOUS | Status: AC
  Administered 2021-03-07: 4 mg via INTRAVENOUS
  Filled 2021-03-07: qty 100

## 2021-03-07 NOTE — Progress Notes (Signed)
Per Dr Irene Limbo pt is okay for tx today.

## 2021-03-07 NOTE — Patient Instructions (Signed)

## 2021-03-08 LAB — KAPPA/LAMBDA LIGHT CHAINS
Kappa free light chain: 18 mg/L (ref 3.3–19.4)
Kappa, lambda light chain ratio: 0.87 (ref 0.26–1.65)
Lambda free light chains: 20.8 mg/L (ref 5.7–26.3)

## 2021-03-11 ENCOUNTER — Other Ambulatory Visit: Payer: Self-pay | Admitting: Nurse Practitioner

## 2021-03-11 ENCOUNTER — Telehealth: Payer: Self-pay | Admitting: Hematology

## 2021-03-11 ENCOUNTER — Other Ambulatory Visit (HOSPITAL_COMMUNITY): Payer: Self-pay

## 2021-03-11 DIAGNOSIS — C9000 Multiple myeloma not having achieved remission: Secondary | ICD-10-CM

## 2021-03-11 LAB — MULTIPLE MYELOMA PANEL, SERUM
Albumin SerPl Elph-Mcnc: 4.1 g/dL (ref 2.9–4.4)
Albumin/Glob SerPl: 1.6 (ref 0.7–1.7)
Alpha 1: 0.3 g/dL (ref 0.0–0.4)
Alpha2 Glob SerPl Elph-Mcnc: 0.6 g/dL (ref 0.4–1.0)
B-Globulin SerPl Elph-Mcnc: 0.9 g/dL (ref 0.7–1.3)
Gamma Glob SerPl Elph-Mcnc: 0.9 g/dL (ref 0.4–1.8)
Globulin, Total: 2.6 g/dL (ref 2.2–3.9)
IgA: 68 mg/dL — ABNORMAL LOW (ref 87–352)
IgG (Immunoglobin G), Serum: 965 mg/dL (ref 586–1602)
IgM (Immunoglobulin M), Srm: 20 mg/dL — ABNORMAL LOW (ref 26–217)
Total Protein ELP: 6.7 g/dL (ref 6.0–8.5)

## 2021-03-11 MED ORDER — OZEMPIC (1 MG/DOSE) 4 MG/3ML ~~LOC~~ SOPN
PEN_INJECTOR | SUBCUTANEOUS | 1 refills | Status: DC
  Filled 2021-03-11: qty 9, 90d supply, fill #0
  Filled 2021-05-17: qty 9, 84d supply, fill #1

## 2021-03-11 NOTE — Telephone Encounter (Signed)
Scheduled follow-up appointments per 2/16 los. Patient is aware. 

## 2021-03-13 ENCOUNTER — Other Ambulatory Visit: Payer: Self-pay | Admitting: Hematology

## 2021-03-13 ENCOUNTER — Telehealth: Payer: Self-pay | Admitting: Hematology

## 2021-03-13 ENCOUNTER — Encounter: Payer: Self-pay | Admitting: Hematology

## 2021-03-13 DIAGNOSIS — C9000 Multiple myeloma not having achieved remission: Secondary | ICD-10-CM

## 2021-03-13 NOTE — Telephone Encounter (Signed)
Scheduled follow-up appointments per 2/16 los. Patient is aware. 

## 2021-03-13 NOTE — Progress Notes (Signed)
HEMATOLOGY/ONCOLOGY CLNIC NOTE  Date of Service: .03/07/2021   Patient Care Team: [name], FNP as PCP - General (General Practice)  CHIEF COMPLAINTS/PURPOSE OF CONSULTATION:  Follow-up for continued management of multiple myeloma  HISTORY OF PRESENTING ILLNESS:   Please see previous note for details   INTERVAL HISTORY:  Alexandra [name] is here for continued evaluation and management of her multiple myeloma .  She is tolerating her maintenance Revlimid without any acute toxicities.  Grade 1 fatigue.  Grade 1 diarrhea.  Labs today  CBC with a hemoglobin of 11.7 WBC count of 3.1k platelet count of 270k CMP within normal limits Myeloma panel 03/07/2021 shows no monoclonal protein spike  Bone marrow biopsy done on 01/18/2021 showed normocellular bone marrow with no morphologic evidence of plasma cell neoplasm. 24-hour UPEP on 01/22/2021 showed unremarkable immunofixation pattern and no monoclonal protein. PET CT scan 01/16/2021-negative for FDG avid lesions of myeloma MRI brain 01/12/2021-showed no overt active myeloma lesions.  ONCOLOGIC HISTORY   Lambda Light Chain Multiple Myeloma  A. 09/27/19: CT Maxillofacial enhancing dural-based anterior cranial fossa mass traversing the calvarium and extending into the extracalvarial soft tissues along with a 7.4 mm rightward midline shift at the level of anterior cranial fossa. MRI head with and without contrast is recommended for further evaluation. B. 10/05/19: MRI Head large extra-axial mass anterior left frontal region with bone erosion and extension into the scalp soft tissues. Considerations include hemangiopericytoma, metastasis, and aggressive hemangioma. 2.6 mm enhancing lesion in the clivus.  C. 10/10/19: Left craniectomy with frontal brain tumor/bone resection, path + plasma cell neoplasm, lambda restricted, plasmacytoma favored. Hgb 9.9,  D. 10/31/19: Initial Hem-Onc consultation; M-spike 0; IgG 627, IgQ 51, IgM 11;  SFLC: Kappa 1.12 mg/dL, Lambda 154.50, ratio 0.01; IFE: serum + monoclonal free Lambda light chain; Hgb 10.1,  E. 11/04/19: 24 hour UPEP Total protein 1318, Free lambda light chains Ur 1332.46, ratio 0.02, M-spike 27.4, M-spike 24 hr at 361; Bence Jones positive, Lambda type F. 11/08/19: BMBx 22% atypical plasma cells, 30% by IHC; FISH Dup(1q) and Del(13q) detected G. 11/10/19: PET CT no metabolic activity within the skeleton to localize active myeloma; no lytic lesions identified; no abnormal activity at the left craniectomy site; no evidence of soft tissue plasmacytoma. H. 12/05/19: Started KRD; SFLC: Kappa 1.42, Lambda 177.97, ratio 0.01; Hgb 11.0 I. 12//13/21: SFLC: Kappa 1.45, Lambda 8.92, ratio 0.16; Hgb 11.1 J. 02/06/20: SFLC: Kappa 1.10, Lambda 2.08, ratio 0.53; Hgb 11.3 K. 02/26/20: Cycle 4 of KRD; SFLC: Kappa 1.14, Lambda 1.90, ratio 0.60; Hgb 10.8 L. 04/02/20: SFLC: Kappa 1.05, Lambda 1.56, ratio 0.67; Hgb 11.2 M. 04/30/20: SFLC: Kappa 1.01, Lambda 1.40, ratio 0.72; Hgb 11.5  N. 05/05/20: MRI Brain no evidence of left frontal bone lesion recurrence; Multiple subcentimeter area of nodular enhancement in the calvarium are nonspecific but could related to myeloma given nonvisualization on prior. O. 05/11/20: PET CT no findings to suggest FDG avid lesions of multiple myeloma. P. 05/14/20: BMBx 1% plasma cells Q. 05/28/20: SFLC: Kappa 0.84, Lambda 1.16, ratio 0.72; Hgb 11.6  R. 06/11/20: EKG QT 408/QTc 424; PFT's: FEV1 110%, DLCO 58.8% predicted, 64.4% hgb corrected; ECHO: EF 55% est, 61% calc; GLS -20%; SFLC: Kappa 1.16, Lambda 1.40, ratio 0.83; Hgb 10.9-VGPR S. 07/06/20: SFLC: Kappa 1.16, Lambda 2.14, ratio 0.54; . Hgb 11.7, Plt 118 T. 6/20-7/12/22: Hospital admission for Melphalan 200 mg/m2 followed by reinfusion of 4.71 x 10^6/kg CD34+ autologous cells MEDICAL HISTORY:  Past Medical History:  Diagnosis Date  Anxiety    Hypertension    Pre-diabetes     SURGICAL HISTORY: Past Surgical History:   Procedure Laterality Date   ABLATION     APPLICATION OF CRANIAL NAVIGATION N/A 10/10/2019   Procedure: APPLICATION OF CRANIAL NAVIGATION;  Surgeon: Consuella Lose, MD;  Location: [place];  Service: Neurosurgery;  Laterality: N/A;  APPLICATION OF CRANIAL NAVIGATION   CRANIOPLASTY Left 10/14/2019   Procedure: LEFT CRANIOPLASTY WITH PLACEMENT OF CUSTOM CRANIAL IMPLANT;  Surgeon: Consuella Lose, MD;  Location: [place];  Service: Neurosurgery;  Laterality: Left;   CRANIOTOMY Left 10/10/2019   Procedure: Left frontal Stereotactic craniectomy for resection of tumor;  Surgeon: Consuella Lose, MD;  Location: [place];  Service: Neurosurgery;  Laterality: Left;  Left frontal Stereotactic craniectomy for resection of tumor   HAMMER TOE SURGERY Left    IR IMAGING GUIDED PORT INSERTION  12/01/2019   TUBAL LIGATION      SOCIAL HISTORY: Social History   Socioeconomic History   Marital status: Single    Spouse name: Not on file   Number of children: Not on file   Years of education: Not on file   Highest education level: 12th grade  Occupational History   Not on file  Tobacco Use   Smoking status: Never   Smokeless tobacco: Never  Vaping Use   Vaping Use: Never used  Substance and Sexual Activity   Alcohol use: Yes    Comment: occassionally   Drug use: Not Currently   Sexual activity: Not on file  Other Topics Concern   Not on file  Social History Narrative   Not on file   Social Determinants of Health   Financial Resource Strain: Medium Risk   Difficulty of Paying Living Expenses: Somewhat hard  Food Insecurity: Food Insecurity Present   Worried About Charity fundraiser in the Last Year: Sometimes true   Arboriculturist in the Last Year: Never true  Transportation Needs: Unmet Transportation Needs   Lack of Transportation (Medical): No   Lack of Transportation (Non-Medical): Yes  Physical Activity: Sufficiently Active   Days of Exercise per Week: 5 days   Minutes of Exercise  per Session: 30 min  Stress: No Stress Concern Present   Feeling of Stress : Only a little  Social Connections: Moderately Integrated   Frequency of Communication with Friends and Family: More than three times a week   Frequency of Social Gatherings with Friends and Family: Once a week   Attends Religious Services: More than 4 times per year   Active Member of [employer] or Organizations: Yes   Attends Archivist Meetings: 1 to 4 times per year   Marital Status: Divorced  Human resources officer Violence: Not on file    FAMILY HISTORY: Family History  Problem Relation Age of Onset   Breast cancer Maternal Grandmother    Heart disease Mother    Hypertension Mother    Sarcoidosis Father    Diabetes Maternal Grandfather     ALLERGIES:  has No Known Allergies.  MEDICATIONS:  Current Outpatient Medications  Medication Sig Dispense Refill   atorvastatin (LIPITOR) 10 MG tablet Take 1 tablet (10 mg total) by mouth daily. 30 tablet 1   benazepril-hydrochlorthiazide (LOTENSIN HCT) 10-12.5 MG tablet Take 1 tablet by mouth daily. 90 tablet 1   docusate sodium (COLACE) 100 MG capsule Take 100 mg by mouth 2 (two) times daily.     levocetirizine (XYZAL) 5 MG tablet SMARTSIG:1 Tablet(s) By Mouth Every Evening  traZODone (DESYREL) 50 MG tablet TAKE 1 TO 2 TABLETS BY MOUTH AT BEDTIME AS NEEDED FOR SLEEP 180 tablet 1   valACYclovir (VALTREX) 500 MG tablet Take 1 tablet by mouth 2 times a day 60 tablet 10   benazepril-hydrochlorthiazide (LOTENSIN HCT) 20-12.5 MG tablet Take 1 tablet by mouth daily. 30 tablet 1   OZEMPIC, 1 MG/DOSE, 4 MG/3ML SOPN INJECT ONCE WEEKLY, SAME DAY. 9 mL 1   REVLIMID 10 MG capsule TAKE 1 CAPSULE BY MOUTH ONCE DAILY FOR 21 DAYS ON AND 7 DAYS OFF 21 capsule 0   valACYclovir (VALTREX) 1000 MG tablet TAKE 1 TABLET BY MOUTH EVERY DAY AS NEEDED (Patient not taking: Reported on 12/18/2020) 90 tablet 1   valACYclovir (VALTREX) 500 MG tablet Take 500 mg by mouth 2 (two) times  daily. (Patient not taking: Reported on 03/07/2021)     No current facility-administered medications for this visit.    REVIEW OF SYSTEMS:   10 Point review of Systems was done is negative except as noted above.  PHYSICAL EXAMINATION: ECOG PERFORMANCE STATUS: 1 - Symptomatic but completely ambulatory  . Vitals:   03/07/21 1454  BP: (!) 140/92  Pulse: 66  Resp: 18  Temp: 98.3 F (36.8 C)  SpO2: 100%    Filed Weights   03/07/21 1454  Weight: 160 lb 4.8 oz (72.7 kg)    .Body mass index is 25.11 kg/m.  NAD GENERAL:alert, in no acute distress and comfortable SKIN: no acute rashes, no significant lesions EYES: conjunctiva are pink and non-injected, sclera anicteric OROPHARYNX: MMM, no exudates, no oropharyngeal erythema or ulceration NECK: supple, no JVD LYMPH:  no palpable lymphadenopathy in the cervical, axillary or inguinal regions LUNGS: clear to auscultation b/l with normal respiratory effort HEART: regular rate & rhythm ABDOMEN:  normoactive bowel sounds , non tender, not distended. Extremity: no pedal edema PSYCH: alert & oriented x 3 with fluent speech NEURO: no focal motor/sensory deficits   LABORATORY DATA:  I have reviewed the data as listed  CBC Latest Ref Rng & Units 03/07/2021 01/18/2021 01/17/2021  WBC 4.0 - 10.5 K/uL 3.1(L) 3.3(L) 3.5(L)  Hemoglobin 12.0 - 15.0 g/dL 11.7(L) 12.3 12.2  Hematocrit 36.0 - 46.0 % 34.2(L) 36.5 35.6(L)  Platelets 150 - 400 K/uL 270 233 267    CMP Latest Ref Rng & Units 03/07/2021 01/17/2021 12/18/2020  Glucose 70 - 99 mg/dL 82 89 78  BUN 6 - 20 mg/dL _0 Creatinine 0.44 - 1.00 mg/dL 0.82 0.69 0.74  Sodium 135 - 145 mmol/L 137 138 137  Potassium 3.5 - 5.1 mmol/L 3.8 3.8 3.8  Chloride 98 - 111 mmol/L 104 105 101  CO2 22 - 32 mmol/L _1 Calcium 8.9 - 10.3 mg/dL 9.2 9.3 9.2  Total Protein 6.5 - 8.1 g/dL 7.1 6.8 7.3  Total Bilirubin 0.3 - 1.2 mg/dL 0.4 0.6 0.7  Alkaline Phos 38 - 126 U/L 43 34(L) 42  AST 15 -  41 U/L 14(L) 12(L) 16  ALT 0 - 44 U/L _2 11/08/2019 FISH Plasma Cell Myeloma Prognostic Panel:    11/08/2019 Cytogenetics:    11/08/2019 Bone Marrow Report [phone]):   10/10/2019 Surgical Pathology [phone]):   05/14/2020 Surgical Pathology "-Normocellular bone marrow for age with trilineage hematopoiesis and 1%  plasma cells  -See comment   PERIPHERAL BLOOD:  -Normocytic-normochromic anemia  -Leukopenia   COMMENT:   The bone marrow is generally normocellular for age with trilineage  hematopoiesis but with  predominance of erythroid precursors.  The  myeloid changes are not considered specific and likely related to  therapy.  In this background, the plasma cells represent 1% of all cells  associated with scattered small clusters and display slight lambda light  chain excess.  The findings are limited but most suggestive of minimal  residual plasma cell neoplasm.  Correlation with cytogenetic and FISH  studies is recommended. "  RADIOGRAPHIC STUDIES:  I have personally reviewed the radiological images as listed and agreed with the findings in the report. No results found.   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 No M spike. Lambda light chain myeloma + Anemia + bone lesion on skull-- resected No renal failure, hypercalcemia  BMBx- 22-30% lambda restricted plasma cell MolCy Dup (1q), del (13q)  Completed 7 cycles of KRD  The pt recently had a transplant at [facility]. DISEASE Lambda Light Chain Multiple Myeloma  - She will receive Melphalan 200 mg/m2 conditioning regimen, followed by autologous stem cell rescue (see details below).  TRANSPLANT: Preparative regimen:  - Melphalan  200 mg/m2 (352m) IV: 07/09/20  Stem Cells:  - Autologous stem cell reinfusion on 07/10/20. Patient received 4.71 x 10^6 CD34+/kg. - Her course was complicated by neutropenic fevers and Bcx 6/27 positive for E coli. She received IV cefepime from 6/27 until 6/30, which cefepime was switched to zosyn in the setting of new abdominal pain. CT abdomen/pelvis was performed that showed acute uncomplicated multifocal diverticulitis involving the transverse and descending colon. She was treated with bowel rest, prn pain medication, and IV zosyn, which was continued until engraftment on 7/5 and was transitioned to ppx ceftriaxone. Unfortunately, with the transition to ceftriaxone she had recurrent fevers and as a result was restarted on zosyn. Zosyn was transitioned to oral cipro/flagyl on 7/10 with the plan to continue for a 7 day course.   3) prediabetes 4) hypertension 5) anxiety 6) dyslipidemia PLAN: -Discussed  Labs today  CBC with a hemoglobin of 11.7 WBC count of 3.1k platelet count of 270k CMP within normal limits Myeloma panel 03/07/2021 shows no monoclonal protein spike  Bone marrow biopsy done on 01/18/2021 showed normocellular bone marrow with no morphologic evidence of plasma cell neoplasm. 24-hour UPEP on 01/22/2021 showed unremarkable immunofixation pattern and no monoclonal protein. PET CT scan 01/16/2021-negative for FDG avid lesions of myeloma MRI brain 01/12/2021-showed no overt active myeloma lesions.  -No prohibitive toxicities from her maintenance Revlimid at 10 mg 3 weeks on 1 week off -Discussed adding carfilzomib due to her aggressive multiple myeloma.  Patient is agreeable if her schedules could be accommodated. --MRD testing on BM Bx if possible -Carfilzomib every 2 weeks starting in 2 weeks (Patient can only come for appointment after 3:30pm due to work) -post transplant vaccination -zometa every 3 months -MD visit in 4 weeks -Continue daily baby ASA, Valtrex, & B  complex.  FOLLOW UP: -MRD testing on BM Bx -Carfilzomib every 2 weeks starting in 2 weeks (Patient can only come for appointment after 3:30pm due to work) -post transplant vaccination -zometa every 3 months -MD visit in 4 weeks  The total time spent in the appointment was 45 minutes*.  All of the patient's questions were answered with apparent satisfaction. The patient knows to call  the clinic with any problems, questions or concerns.   Sullivan Lone MD MS AAHIVMS [facility] Hematology/Oncology Physician [facility]  .*Total Encounter Time as defined by the Centers for Medicare and Medicaid Services includes, in addition to the face-to-face time of a patient visit (documented in the note above) non-face-to-face time: obtaining and reviewing outside history, ordering and reviewing medications, tests or procedures, care coordination (communications with other health care professionals or caregivers) and documentation in the medical record.

## 2021-03-19 ENCOUNTER — Other Ambulatory Visit: Payer: Self-pay

## 2021-03-19 DIAGNOSIS — C9000 Multiple myeloma not having achieved remission: Secondary | ICD-10-CM

## 2021-03-20 ENCOUNTER — Other Ambulatory Visit: Payer: Self-pay

## 2021-03-20 ENCOUNTER — Other Ambulatory Visit: Payer: Self-pay | Admitting: Hematology

## 2021-03-20 ENCOUNTER — Inpatient Hospital Stay: Payer: 59 | Attending: Hematology

## 2021-03-20 DIAGNOSIS — Z5112 Encounter for antineoplastic immunotherapy: Secondary | ICD-10-CM | POA: Insufficient documentation

## 2021-03-20 DIAGNOSIS — C9001 Multiple myeloma in remission: Secondary | ICD-10-CM | POA: Insufficient documentation

## 2021-03-20 DIAGNOSIS — C9000 Multiple myeloma not having achieved remission: Secondary | ICD-10-CM

## 2021-03-20 LAB — CMP (CANCER CENTER ONLY)
ALT: 14 U/L (ref 0–44)
AST: 17 U/L (ref 15–41)
Albumin: 4.4 g/dL (ref 3.5–5.0)
Alkaline Phosphatase: 49 U/L (ref 38–126)
Anion gap: 6 (ref 5–15)
BUN: 11 mg/dL (ref 6–20)
CO2: 29 mmol/L (ref 22–32)
Calcium: 9.3 mg/dL (ref 8.9–10.3)
Chloride: 101 mmol/L (ref 98–111)
Creatinine: 0.75 mg/dL (ref 0.44–1.00)
GFR, Estimated: >60 mL/min (ref >60)
Glucose, Bld: 89 mg/dL (ref 70–99)
Potassium: 4.3 mmol/L (ref 3.5–5.1)
Sodium: 136 mmol/L (ref 135–145)
Total Bilirubin: 0.4 mg/dL (ref 0.3–1.2)
Total Protein: 7.2 g/dL (ref 6.5–8.1)

## 2021-03-20 LAB — CBC WITH DIFFERENTIAL (CANCER CENTER ONLY)
Abs Immature Granulocytes: 0 10*3/uL (ref 0.00–0.07)
Basophils Absolute: 0 10*3/uL (ref 0.0–0.1)
Basophils Relative: 1 %
Eosinophils Absolute: 0.2 10*3/uL (ref 0.0–0.5)
Eosinophils Relative: 6 %
HCT: 37.9 % (ref 36.0–46.0)
Hemoglobin: 12.6 g/dL (ref 12.0–15.0)
Immature Granulocytes: 0 %
Lymphocytes Relative: 37 %
Lymphs Abs: 1.2 10*3/uL (ref 0.7–4.0)
MCH: 29 pg (ref 26.0–34.0)
MCHC: 33.2 g/dL (ref 30.0–36.0)
MCV: 87.1 fL (ref 80.0–100.0)
Monocytes Absolute: 0.5 10*3/uL (ref 0.1–1.0)
Monocytes Relative: 16 %
Neutro Abs: 1.3 10*3/uL — ABNORMAL LOW (ref 1.7–7.7)
Neutrophils Relative %: 40 %
Platelet Count: 215 10*3/uL (ref 150–400)
RBC: 4.35 MIL/uL (ref 3.87–5.11)
RDW: 15.4 % (ref 11.5–15.5)
WBC Count: 3.3 10*3/uL — ABNORMAL LOW (ref 4.0–10.5)
nRBC: 0 % (ref 0.0–0.2)

## 2021-03-20 MED FILL — Dexamethasone Sodium Phosphate Inj 100 MG/10ML: INTRAMUSCULAR | Qty: 1 | Status: AC

## 2021-03-21 ENCOUNTER — Other Ambulatory Visit: Payer: Self-pay | Admitting: Hematology

## 2021-03-21 ENCOUNTER — Inpatient Hospital Stay: Payer: 59

## 2021-03-21 VITALS — BP 116/80 | HR 63 | Temp 98.3°F | Resp 17 | Wt 157.0 lb

## 2021-03-21 DIAGNOSIS — Z5112 Encounter for antineoplastic immunotherapy: Secondary | ICD-10-CM | POA: Diagnosis not present

## 2021-03-21 DIAGNOSIS — C9000 Multiple myeloma not having achieved remission: Secondary | ICD-10-CM

## 2021-03-21 DIAGNOSIS — Z7189 Other specified counseling: Secondary | ICD-10-CM

## 2021-03-21 DIAGNOSIS — C7951 Secondary malignant neoplasm of bone: Secondary | ICD-10-CM

## 2021-03-21 MED ORDER — DEXTROSE 5 % IV SOLN
21.0000 mg/m2 | Freq: Once | INTRAVENOUS | Status: AC
  Administered 2021-03-21: 40 mg via INTRAVENOUS
  Filled 2021-03-21: qty 15

## 2021-03-21 MED ORDER — DEXTROSE 5 % IV SOLN: 20.0000 mg/m2 | Freq: Once | INTRAVENOUS | Status: DC

## 2021-03-21 MED ORDER — SODIUM CHLORIDE 0.9 % IV SOLN: Freq: Once | INTRAVENOUS | Status: AC

## 2021-03-21 MED ORDER — ACETAMINOPHEN 500 MG PO TABS
1000.0000 mg | ORAL_TABLET | Freq: Once | ORAL | Status: AC
  Administered 2021-03-21: 1000 mg via ORAL
  Filled 2021-03-21: qty 2

## 2021-03-21 MED ORDER — SODIUM CHLORIDE 0.9 % IV SOLN
10.0000 mg | Freq: Once | INTRAVENOUS | Status: AC
  Administered 2021-03-21: 10 mg via INTRAVENOUS
  Filled 2021-03-21: qty 10

## 2021-03-21 NOTE — Patient Instructions (Signed)
[facility] MEDICAL ONCOLOGY  Discharge Instructions: Thank you for choosing [facility] to provide your oncology and hematology care.   If you have a lab appointment with the Cancer Center, please go directly to the Cancer Center and check in at the registration area.   Wear comfortable clothing and clothing appropriate for easy access to any Portacath or PICC line.   We strive to give you quality time with your provider. You may need to reschedule your appointment if you arrive late (15 or more minutes).  Arriving late affects you and other patients whose appointments are after yours.  Also, if you miss three or more appointments without notifying the office, you may be dismissed from the clinic at the provider's discretion.      For prescription refill requests, have your pharmacy contact our office and allow 72 hours for refills to be completed.    Today you received the following chemotherapy and/or immunotherapy agents carfilzomib   To help prevent nausea and vomiting after your treatment, we encourage you to take your nausea medication as directed.  BELOW ARE SYMPTOMS THAT SHOULD BE REPORTED IMMEDIATELY: . *FEVER GREATER THAN 100.4 F (38 C) OR HIGHER . *CHILLS OR SWEATING . *NAUSEA AND VOMITING THAT IS NOT CONTROLLED WITH YOUR NAUSEA MEDICATION . *UNUSUAL SHORTNESS OF BREATH . *UNUSUAL BRUISING OR BLEEDING . *URINARY PROBLEMS (pain or burning when urinating, or frequent urination) . *BOWEL PROBLEMS (unusual diarrhea, constipation, pain near the anus) . TENDERNESS IN MOUTH AND THROAT WITH OR WITHOUT PRESENCE OF ULCERS (sore throat, sores in mouth, or a toothache) . UNUSUAL RASH, SWELLING OR PAIN  . UNUSUAL VAGINAL DISCHARGE OR ITCHING   Items with * indicate a potential emergency and should be followed up as soon as possible or go to the Emergency Department if any problems should occur.  Please show the CHEMOTHERAPY ALERT CARD or IMMUNOTHERAPY ALERT  CARD at check-in to the Emergency Department and triage nurse.  Should you have questions after your visit or need to cancel or reschedule your appointment, please contact [facility] MEDICAL ONCOLOGY  Dept: [phone]  and follow the prompts.  Office hours are 8:00 a.m. to 4:30 p.m. Monday - Friday. Please note that voicemails left after 4:00 p.m. may not be returned until the following business day.  We are closed weekends and major holidays. You have access to a nurse at all times for urgent questions. Please call the main number to the clinic Dept: [phone] and follow the prompts.   For any non-urgent questions, you may also contact your provider using MyChart. We now offer e-Visits for anyone 18 and older to request care online for non-urgent symptoms. For details visit [email or web address].   Also download the MyChart app! Go to the app store, search "MyChart", open the app, select [facility], and log in with your MyChart username and password.  Due to Covid, a mask is required upon entering the hospital/clinic. If you do not have a mask, one will be given to you upon arrival. For doctor visits, patients may have 1 support person aged 18 or older with them. For treatment visits, patients cannot have anyone with them due to current Covid guidelines and our immunocompromised population.   

## 2021-03-21 NOTE — Progress Notes (Signed)
Ok to treat with ANC 1.3 today per Dr Lorenso Courier. ? ?Patient expressed concern regarding administration of steroids for treatment. Shawn Stall, RN and Dr Lorenso Courier discussed concerns with patient at chairside and patient is agreeable to proceed with dexamethasone today. ?

## 2021-03-29 ENCOUNTER — Other Ambulatory Visit (HOSPITAL_COMMUNITY): Payer: Self-pay

## 2021-03-29 MED FILL — Trazodone HCl Tab 50 MG: ORAL | 90 days supply | Qty: 180 | Fill #0 | Status: AC

## 2021-03-31 ENCOUNTER — Other Ambulatory Visit: Payer: Self-pay | Admitting: Nurse Practitioner

## 2021-03-31 DIAGNOSIS — C9000 Multiple myeloma not having achieved remission: Secondary | ICD-10-CM

## 2021-04-01 ENCOUNTER — Other Ambulatory Visit: Payer: Self-pay

## 2021-04-01 ENCOUNTER — Encounter: Payer: Self-pay | Admitting: Hematology

## 2021-04-01 ENCOUNTER — Encounter: Payer: Self-pay | Admitting: Nurse Practitioner

## 2021-04-01 ENCOUNTER — Other Ambulatory Visit (HOSPITAL_COMMUNITY): Payer: Self-pay

## 2021-04-01 ENCOUNTER — Ambulatory Visit: Payer: 59 | Admitting: Nurse Practitioner

## 2021-04-01 VITALS — BP 116/82 | HR 62 | Temp 98.1°F | Ht 67.0 in | Wt 161.4 lb

## 2021-04-01 DIAGNOSIS — E782 Mixed hyperlipidemia: Secondary | ICD-10-CM

## 2021-04-01 DIAGNOSIS — R7303 Prediabetes: Secondary | ICD-10-CM | POA: Diagnosis not present

## 2021-04-01 DIAGNOSIS — I10 Essential (primary) hypertension: Secondary | ICD-10-CM

## 2021-04-01 DIAGNOSIS — E663 Overweight: Secondary | ICD-10-CM

## 2021-04-01 DIAGNOSIS — C9000 Multiple myeloma not having achieved remission: Secondary | ICD-10-CM

## 2021-04-01 DIAGNOSIS — Z6825 Body mass index (BMI) 25.0-25.9, adult: Secondary | ICD-10-CM

## 2021-04-01 MED ORDER — BENAZEPRIL HCL 10 MG PO TABS
10.0000 mg | ORAL_TABLET | Freq: Every day | ORAL | 1 refills | Status: DC
  Filled 2021-04-01: qty 90, 90d supply, fill #0

## 2021-04-01 MED ORDER — HYDROCHLOROTHIAZIDE 12.5 MG PO TABS
ORAL_TABLET | ORAL | 1 refills | Status: DC
  Filled 2021-04-01: qty 90, 90d supply, fill #0

## 2021-04-01 NOTE — Progress Notes (Signed)
?[employer] as a Education administrator for [employer], FNP.,have documented all relevant documentation on the behalf of [name], FNP,as directed by  [name], FNP while in the presence of [name], [name].  ? ?This visit occurred during the SARS-CoV-2 public health emergency.  Safety protocols were in place, including screening questions prior to the visit, additional usage of staff PPE, and extensive cleaning of exam room while observing appropriate contact time as indicated for disinfecting solutions. ? ?Subjective:  ?  ? Patient ID: Alexandra [name] , female    DOB: May 18, 1965 , 56 y.o.   MRN: [id number] ? ? ?Chief Complaint  ?Patient presents with  ? Hypertension  ? ? ?HPI ? ?Patient here for a blood pressure f/u.  Continues to see Dr. Irene Limbo and provider at [facility]. Reports she is in remission. She had an infusion 2 times a month and revemid 21 days and off 7 days. The infusion therapy will be until September at least. She reports she is feeling fine. She is taking steroids two times a month with the infusion therapy.  ? 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 Criss Rosales from home.   ? ?She has not been taking the benazapril 10/12.24m - blood pressure is 101/73 this morning. She has not taken there medications in several months.  ? ?Hypertension ?This is a chronic problem. The current episode started more than 1 year ago. The problem has been gradually worsening since onset. The problem is uncontrolled. Pertinent negatives include no anxiety, blurred vision, chest pain, headaches or palpitations. There are no associated agents to hypertension. Risk factors for coronary artery disease include obesity. Past treatments include diuretics and angiotensin blockers. The current treatment provides no improvement. There are no compliance problems.  There is no history of angina. There is no history of chronic renal disease.  ?Diabetes ?She presents for her follow-up diabetic visit.  She has type 2 diabetes mellitus. No MedicAlert identification noted. Her disease course has been stable. Pertinent negatives for hypoglycemia include no dizziness or headaches. Pertinent negatives for diabetes include no blurred vision, no chest pain, no fatigue, no polydipsia, no polyphagia and no polyuria. Symptoms are stable. There are no diabetic complications. Risk factors for coronary artery disease include hypertension and diabetes mellitus. Current diabetic treatments: Was on Victoza until May, no insurance since then. She is compliant with treatment all of the time. She is following a generally healthy diet. When asked about meal planning, she reported none. She has not had a previous visit with a dietitian. An ACE inhibitor/angiotensin II receptor blocker is being taken. She does not see a podiatrist.Eye exam is not current.   ? ?Past Medical History:  ?Diagnosis Date  ? Anxiety   ? Hypertension   ? Pre-diabetes   ?  ? ?Family History  ?Problem Relation Age of Onset  ? Breast cancer Maternal Grandmother   ? Heart disease Mother   ? Hypertension Mother   ? Sarcoidosis Father   ? Diabetes Maternal Grandfather   ? ? ? ?Current Outpatient Medications:  ?  atorvastatin (LIPITOR) 10 MG tablet, Take 1 tablet (10 mg total) by mouth daily., Disp: 30 tablet, Rfl: 1 ?  benazepril (LOTENSIN) 10 MG tablet, Take 1 tablet (10 mg total) by mouth daily., Disp: 90 tablet, Rfl: 1 ?  docusate sodium (COLACE) 100 MG capsule, Take 100 mg by mouth 2 (two) times daily., Disp: , Rfl:  ?  hydrochlorothiazide (HYDRODIURIL) 12.5 MG tablet, Take 1  tablet by mouth daily as needed for leg swelling, Disp: 90 tablet, Rfl: 1 ?  levocetirizine (XYZAL) 5 MG tablet, TAKE 1 TABLET BY MOUTH EVERY DAY IN THE EVENING, Disp: 90 tablet, Rfl: 3 ?  OZEMPIC, 1 MG/DOSE, 4 MG/3ML SOPN, INJECT ONCE WEEKLY, SAME DAY. (Patient taking differently: 1 mg. INJECT ONCE WEEKLY, SAME DAY.), Disp: 9 mL, Rfl: 1 ?  REVLIMID 10 MG capsule, TAKE 1 CAPSULE BY MOUTH  ONCE DAILY FOR 21 DAYS ON AND 7 DAYS OFF, Disp: 21 capsule, Rfl: 0 ?  traZODone (DESYREL) 50 MG tablet, TAKE 1 TO 2 TABLETS BY MOUTH AT BEDTIME AS NEEDED FOR SLEEP, Disp: 180 tablet, Rfl: 1 ?  valACYclovir (VALTREX) 500 MG tablet, Take 1 tablet by mouth 2 times a day, Disp: 60 tablet, Rfl: 10  ? ?No Known Allergies  ? ?Review of Systems  ?Constitutional:  Negative for fatigue.  ?Eyes:  Negative for blurred vision.  ?Cardiovascular:  Negative for chest pain and palpitations.  ?Endocrine: Negative for polydipsia, polyphagia and polyuria.  ?Neurological:  Negative for dizziness and headaches.   ? ?Today's Vitals  ? 04/01/21 1618  ?BP: 116/82  ?Pulse: 62  ?Temp: 98.1 ?F (36.7 ?C)  ?Weight: 161 lb 6.4 oz (73.2 kg)  ?Height: 5' 7"  (1.702 m)  ? ?Body mass index is 25.28 kg/m?.  ?Wt Readings from Last 3 Encounters:  ?04/01/21 161 lb 6.4 oz (73.2 kg)  ?03/21/21 157 lb (71.2 kg)  ?03/07/21 160 lb 4.8 oz (72.7 kg)  ?  ?BP Readings from Last 3 Encounters:  ?04/01/21 116/82  ?03/21/21 116/80  ?03/07/21 (!) 140/92  ?  ?Objective:  ?Physical Exam ?Vitals reviewed.  ?Constitutional:   ?   General: She is not in acute distress. ?   Appearance: Normal appearance.  ?Cardiovascular:  ?   Rate and Rhythm: Normal rate.  ?   Pulses: Normal pulses.  ?   Heart sounds: Normal heart sounds. No murmur heard. ?Pulmonary:  ?   Effort: Pulmonary effort is normal. No respiratory distress.  ?   Breath sounds: Normal breath sounds. No wheezing.  ?Neurological:  ?   General: No focal deficit present.  ?   Mental Status: She is alert and oriented to person, place, and time.  ?   Cranial Nerves: No cranial nerve deficit.  ?   Motor: No weakness.  ?Psychiatric:     ?   Mood and Affect: Mood normal.     ?   Behavior: Behavior normal.     ?   Thought Content: Thought content normal.     ?   Judgment: Judgment normal.  ?  ? ?   ?Assessment And Plan:  ?   ?1. Essential hypertension ?Comments: Blood pressure systolic is slightly elevated she is to take  benazapril only has been having low blood pressure readings. Take HCTZ as needed for leg swelling ?- benazepril (LOTENSIN) 10 MG tablet; Take 1 tablet (10 mg total) by mouth daily.  Dispense: 90 tablet; Refill: 1 ?- hydrochlorothiazide (HYDRODIURIL) 12.5 MG tablet; Take 1 tablet by mouth daily as needed for leg swelling  Dispense: 90 tablet; Refill: 1 ? ?2. Prediabetes ?Comments: Doing well with Ozempic, will check HgbA1c.  ?- Hemoglobin A1c ? ?3. Mixed hyperlipidemia ?Comments: Tolerating statin well, continue current medications ?- Lipid panel ? ?4. Overweight with body mass index (BMI) of 25 to 25.9 in adult ?Comments: Continue with healthy diet and regular physical actiivty ?She is encouraged to strive for BMI less than 25  to decrease cardiac risk. Advised to aim for at least 150 minutes of exercise per week.   ? ?5. Multiple myeloma, remission status unspecified ([place]) ?Comments: Reports she is in remission and is now on infusions twice a month and oral chemotherapy ? ? ?Patient was given opportunity to ask questions. Patient verbalized understanding of the plan and was able to repeat key elements of the plan. All questions were answered to their satisfaction.  ?[name], FNP  ? ?I, [name], FNP, have reviewed all documentation for this visit. The documentation on 04/01/21 for the exam, diagnosis, procedures, and orders are all accurate and complete.  ? ?IF YOU HAVE BEEN REFERRED TO A SPECIALIST, IT MAY TAKE 1-2 WEEKS TO SCHEDULE/PROCESS THE REFERRAL. IF YOU HAVE NOT HEARD FROM US/SPECIALIST IN TWO WEEKS, PLEASE GIVE [place] A CALL AT [phone].  ? ?THE PATIENT IS ENCOURAGED TO PRACTICE SOCIAL DISTANCING DUE TO THE COVID-19 PANDEMIC.   ?

## 2021-04-02 ENCOUNTER — Other Ambulatory Visit: Payer: Self-pay

## 2021-04-02 DIAGNOSIS — C9000 Multiple myeloma not having achieved remission: Secondary | ICD-10-CM

## 2021-04-02 LAB — LIPID PANEL
Chol/HDL Ratio: 2.4 ratio (ref 0.0–4.4)
Cholesterol, Total: 149 mg/dL (ref 100–199)
HDL: 63 mg/dL (ref >39)
LDL Chol Calc (NIH): 73 mg/dL (ref 0–99)
Triglycerides: 62 mg/dL (ref 0–149)
VLDL Cholesterol Cal: 13 mg/dL (ref 5–40)

## 2021-04-02 LAB — HEMOGLOBIN A1C
Est. average glucose Bld gHb Est-mCnc: 100 mg/dL
Hgb A1c MFr Bld: 5.1 % (ref 4.8–5.6)

## 2021-04-03 ENCOUNTER — Other Ambulatory Visit: Payer: Self-pay

## 2021-04-03 ENCOUNTER — Inpatient Hospital Stay: Payer: 59

## 2021-04-03 ENCOUNTER — Other Ambulatory Visit (HOSPITAL_COMMUNITY): Payer: Self-pay

## 2021-04-03 DIAGNOSIS — Z5112 Encounter for antineoplastic immunotherapy: Secondary | ICD-10-CM | POA: Diagnosis not present

## 2021-04-03 DIAGNOSIS — C9000 Multiple myeloma not having achieved remission: Secondary | ICD-10-CM

## 2021-04-03 LAB — CMP (CANCER CENTER ONLY)
ALT: 17 U/L (ref 0–44)
AST: 15 U/L (ref 15–41)
Albumin: 4.1 g/dL (ref 3.5–5.0)
Alkaline Phosphatase: 44 U/L (ref 38–126)
Anion gap: 6 (ref 5–15)
BUN: 15 mg/dL (ref 6–20)
CO2: 29 mmol/L (ref 22–32)
Calcium: 9.1 mg/dL (ref 8.9–10.3)
Chloride: 103 mmol/L (ref 98–111)
Creatinine: 0.68 mg/dL (ref 0.44–1.00)
GFR, Estimated: >60 mL/min (ref >60)
Glucose, Bld: 90 mg/dL (ref 70–99)
Potassium: 4.1 mmol/L (ref 3.5–5.1)
Sodium: 138 mmol/L (ref 135–145)
Total Bilirubin: 0.4 mg/dL (ref 0.3–1.2)
Total Protein: 6.7 g/dL (ref 6.5–8.1)

## 2021-04-03 LAB — CBC WITH DIFFERENTIAL (CANCER CENTER ONLY)
Abs Immature Granulocytes: 0.01 10*3/uL (ref 0.00–0.07)
Basophils Absolute: 0.1 10*3/uL (ref 0.0–0.1)
Basophils Relative: 1 %
Eosinophils Absolute: 0.2 10*3/uL (ref 0.0–0.5)
Eosinophils Relative: 5 %
HCT: 35.1 % — ABNORMAL LOW (ref 36.0–46.0)
Hemoglobin: 12 g/dL (ref 12.0–15.0)
Immature Granulocytes: 0 %
Lymphocytes Relative: 36 %
Lymphs Abs: 1.4 10*3/uL (ref 0.7–4.0)
MCH: 30 pg (ref 26.0–34.0)
MCHC: 34.2 g/dL (ref 30.0–36.0)
MCV: 87.8 fL (ref 80.0–100.0)
Monocytes Absolute: 0.5 10*3/uL (ref 0.1–1.0)
Monocytes Relative: 12 %
Neutro Abs: 1.8 10*3/uL (ref 1.7–7.7)
Neutrophils Relative %: 46 %
Platelet Count: 278 10*3/uL (ref 150–400)
RBC: 4 MIL/uL (ref 3.87–5.11)
RDW: 16 % — ABNORMAL HIGH (ref 11.5–15.5)
WBC Count: 3.9 10*3/uL — ABNORMAL LOW (ref 4.0–10.5)
nRBC: 0 % (ref 0.0–0.2)

## 2021-04-03 MED FILL — Levocetirizine Dihydrochloride Tab 5 MG: ORAL | 90 days supply | Qty: 90 | Fill #0 | Status: AC

## 2021-04-03 MED FILL — Dexamethasone Sodium Phosphate Inj 100 MG/10ML: INTRAMUSCULAR | Qty: 1 | Status: AC

## 2021-04-04 ENCOUNTER — Inpatient Hospital Stay: Payer: 59 | Admitting: Hematology

## 2021-04-04 ENCOUNTER — Inpatient Hospital Stay (HOSPITAL_BASED_OUTPATIENT_CLINIC_OR_DEPARTMENT_OTHER): Payer: 59

## 2021-04-04 VITALS — BP 102/65 | HR 65 | Temp 98.2°F | Resp 16 | Wt 160.5 lb

## 2021-04-04 DIAGNOSIS — C7951 Secondary malignant neoplasm of bone: Secondary | ICD-10-CM

## 2021-04-04 DIAGNOSIS — Z5111 Encounter for antineoplastic chemotherapy: Secondary | ICD-10-CM

## 2021-04-04 DIAGNOSIS — C9000 Multiple myeloma not having achieved remission: Secondary | ICD-10-CM

## 2021-04-04 DIAGNOSIS — Z5112 Encounter for antineoplastic immunotherapy: Secondary | ICD-10-CM | POA: Diagnosis not present

## 2021-04-04 LAB — KAPPA/LAMBDA LIGHT CHAINS
Kappa free light chain: 13.4 mg/L (ref 3.3–19.4)
Kappa, lambda light chain ratio: 0.63 (ref 0.26–1.65)
Lambda free light chains: 21.3 mg/L (ref 5.7–26.3)

## 2021-04-04 MED ORDER — SODIUM CHLORIDE 0.9 % IV SOLN: Freq: Once | INTRAVENOUS | Status: AC

## 2021-04-04 MED ORDER — SODIUM CHLORIDE 0.9 % IV SOLN
10.0000 mg | Freq: Once | INTRAVENOUS | Status: AC
  Administered 2021-04-04: 10 mg via INTRAVENOUS
  Filled 2021-04-04: qty 10

## 2021-04-04 MED ORDER — ACETAMINOPHEN 500 MG PO TABS
1000.0000 mg | ORAL_TABLET | Freq: Once | ORAL | Status: AC
  Administered 2021-04-04: 1000 mg via ORAL
  Filled 2021-04-04: qty 2

## 2021-04-04 MED ORDER — DEXTROSE 5 % IV SOLN
36.0000 mg/m2 | Freq: Once | INTRAVENOUS | Status: AC
  Administered 2021-04-04: 70 mg via INTRAVENOUS
  Filled 2021-04-04: qty 30

## 2021-04-04 NOTE — Progress Notes (Signed)
? ? ?HEMATOLOGY/ONCOLOGY CLNIC NOTE ? ?Date of Service: 04/04/2021 ? ? ?Patient Care Team: ?[name], FNP as PCP - General (General Practice) ? ?CHIEF COMPLAINTS/PURPOSE OF CONSULTATION:  ?Follow-up for continued management of multiple myeloma ? ?HISTORY OF PRESENTING ILLNESS:  ? ?Please see previous note for details ? ? ?INTERVAL HISTORY: ? ?Alexandra [name] is a 56 y.o. female here for continued evaluation and management of her multiple myeloma. She reports that she is doing well.  ? ?She reports the development of a bump near her upper left temple.  ? ?She notes a persistent dripping noise. ? ?She notes pain around her recent surgical site in forehead. ? ?She denies any unexpected weight loss. ? ?She describes her menstruation as heavy more liquid flow while on Revlimid. She reports that while off of Revlimid she has a lighter flow. ? ?She expresses concern over the appearance of her recent surgery and we discussed speaking with Dr. Orson Slick about potential solutions. ? ?She is tolerating her maintenance Revlimid without any acute toxicities. ? ?She reports SOB while walking recently after carlfilzomib injection. ? ?We discussed scheduling her vaccinations  ? ?Labs today  ?CBC with a hemoglobin of 12.0 WBC count of 3.9k platelet count of 278k ?CMP within normal limits ?Myeloma panel 04/03/2021 no detectable M spike ?Kappa lambda light chains within normal limits with normal ratio ? ?Lab Results  ?Component Value Date  ? TOTALPROTELP 6.7 03/07/2021  ? ALBUMINELP 4.0 05/10/2018  ? MSPIKE Not Observed 05/10/2018  ? ?(this displays SPEP labs) ? ?Lab Results  ?Component Value Date  ? KPAFRELGTCHN 13.4 04/03/2021  ? LAMBDASER 21.3 04/03/2021  ? KAPLAMBRATIO 0.63 04/03/2021  ? ?(kappa/lambda light chains) ? ? ?ONCOLOGIC HISTORY ? ? ?Lambda Light Chain Multiple Myeloma ? ?A. 09/27/19: CT Maxillofacial enhancing dural-based anterior cranial fossa mass traversing the calvarium and extending into the extracalvarial  soft tissues along with a 7.4 mm rightward midline shift at the level of anterior cranial fossa. MRI head with and without contrast is recommended for further evaluation. ?B. 10/05/19: MRI Head large extra-axial mass anterior left frontal region with bone erosion and extension into the scalp soft tissues. Considerations include hemangiopericytoma, metastasis, and aggressive hemangioma. 2.6 mm enhancing lesion in the clivus.  ?C. 10/10/19: Left craniectomy with frontal brain tumor/bone resection, path + plasma cell neoplasm, lambda restricted, plasmacytoma favored. Hgb 9.9,  ?D. 10/31/19: Initial Hem-Onc consultation; M-spike 0; IgG 627, IgQ 51, IgM 11; SFLC: Kappa 1.12 mg/dL, Lambda 154.50, ratio 0.01; IFE: serum + monoclonal free Lambda light chain; Hgb 10.1,  ?E. 11/04/19: 24 hour UPEP Total protein 1318, Free lambda light chains Ur 1332.46, ratio 0.02, M-spike 27.4, M-spike 24 hr at 361; Bence Jones positive, Lambda type ?F. 11/08/19: BMBx 22% atypical plasma cells, 30% by IHC; FISH Dup(1q) and Del(13q) detected ?G. 11/10/19: PET CT no metabolic activity within the skeleton to localize active myeloma; no lytic lesions identified; no abnormal activity at the left craniectomy site; no evidence of soft tissue plasmacytoma. ?H. 12/05/19: Started KRD; SFLC: Kappa 1.42, Lambda 177.97, ratio 0.01; Hgb 11.0 ?I. 12//13/21: SFLC: Kappa 1.45, Lambda 8.92, ratio 0.16; Hgb 11.1 ?J. 02/06/20: SFLC: Kappa 1.10, Lambda 2.08, ratio 0.53; Hgb 11.3 ?K. 02/26/20: Cycle 4 of KRD; SFLC: Kappa 1.14, Lambda 1.90, ratio 0.60; Hgb 10.8 ?L. 04/02/20: SFLC: Kappa 1.05, Lambda 1.56, ratio 0.67; Hgb 11.2 ?M. 04/30/20: SFLC: Kappa 1.01, Lambda 1.40, ratio 0.72; Hgb 11.5  ?N. 05/05/20: MRI Brain no evidence of left frontal bone lesion recurrence; Multiple subcentimeter  area of nodular enhancement in the calvarium are nonspecific but could related to myeloma given nonvisualization on prior. ?O. 05/11/20: PET CT no findings to suggest FDG avid lesions of  multiple myeloma. ?P. 05/14/20: BMBx 1% plasma cells ?Q. 05/28/20: SFLC: Kappa 0.84, Lambda 1.16, ratio 0.72; Hgb 11.6  ?R. 06/11/20: EKG QT 408/QTc 424; PFT's: FEV1 110%, DLCO 58.8% predicted, 64.4% hgb corrected; ECHO: EF 55% est, 61% calc; GLS -20%; SFLC: Kappa 1.16, Lambda 1.40, ratio 0.83; Hgb 10.9-VGPR ?S. 07/06/20: SFLC: Kappa 1.16, Lambda 2.14, ratio 0.54; . Hgb 11.7, Plt 118 ?T. 6/20-7/12/22: Hospital admission for Melphalan 200 mg/m2 followed by reinfusion of 4.71 x 10^6/kg CD34+ autologous cells ?MEDICAL HISTORY:  ?Past Medical History:  ?Diagnosis Date  ? Anxiety   ? Hypertension   ? Pre-diabetes   ? ? ?SURGICAL HISTORY: ?Past Surgical History:  ?Procedure Laterality Date  ? ABLATION    ? APPLICATION OF CRANIAL NAVIGATION N/A 10/10/2019  ? Procedure: APPLICATION OF CRANIAL NAVIGATION;  Surgeon: Consuella Lose, MD;  Location: [facility];  Service: Neurosurgery;  Laterality: N/A;  APPLICATION OF CRANIAL NAVIGATION  ? CRANIOPLASTY Left 10/14/2019  ? Procedure: LEFT CRANIOPLASTY WITH PLACEMENT OF CUSTOM CRANIAL IMPLANT;  Surgeon: Consuella Lose, MD;  Location: [facility];  Service: Neurosurgery;  Laterality: Left;  ? CRANIOTOMY Left 10/10/2019  ? Procedure: Left frontal Stereotactic craniectomy for resection of tumor;  Surgeon: Consuella Lose, MD;  Location: [facility];  Service: Neurosurgery;  Laterality: Left;  Left frontal Stereotactic craniectomy for resection of tumor  ? HAMMER TOE SURGERY Left   ? IR IMAGING GUIDED PORT INSERTION  12/01/2019  ? TUBAL LIGATION    ? ? ?SOCIAL HISTORY: ?Social History  ? ?Socioeconomic History  ? Marital status: Single  ?  Spouse name: Not on file  ? Number of children: Not on file  ? Years of education: Not on file  ? Highest education level: 12th grade  ?Occupational History  ? Not on file  ?Tobacco Use  ? Smoking status: Never  ? Smokeless tobacco: Never  ?Vaping Use  ? Vaping Use: Never used  ?Substance and Sexual Activity  ? Alcohol use: Yes  ?  Comment: occassionally  ? Drug  use: Not Currently  ? Sexual activity: Not on file  ?Other Topics Concern  ? Not on file  ?Social History Narrative  ? Not on file  ? ?Social Determinants of Health  ? ?Financial Resource Strain: Medium Risk  ? Difficulty of Paying Living Expenses: Somewhat hard  ?Food Insecurity: Food Insecurity Present  ? Worried About Charity fundraiser in the Last Year: Sometimes true  ? Ran Out of Food in the Last Year: Never true  ?Transportation Needs: Unmet Transportation Needs  ? Lack of Transportation (Medical): No  ? Lack of Transportation (Non-Medical): Yes  ?Physical Activity: Sufficiently Active  ? Days of Exercise per Week: 5 days  ? Minutes of Exercise per Session: 30 min  ?Stress: No Stress Concern Present  ? Feeling of Stress : Only a little  ?Social Connections: Moderately Integrated  ? Frequency of Communication with Friends and Family: More than three times a week  ? Frequency of Social Gatherings with Friends and Family: Once a week  ? Attends Religious Services: More than 4 times per year  ? Active Member of Clubs or Organizations: Yes  ? Attends Archivist Meetings: 1 to 4 times per year  ? Marital Status: Divorced  ?Intimate Partner Violence: Not on file  ? ? ?FAMILY HISTORY: ?  Family History  ?Problem Relation Age of Onset  ? Breast cancer Maternal Grandmother   ? Heart disease Mother   ? Hypertension Mother   ? Sarcoidosis Father   ? Diabetes Maternal Grandfather   ? ? ?ALLERGIES:  has No Known Allergies. ? ?MEDICATIONS:  ?Current Outpatient Medications  ?Medication Sig Dispense Refill  ? atorvastatin (LIPITOR) 10 MG tablet Take 1 tablet (10 mg total) by mouth daily. 30 tablet 1  ? benazepril (LOTENSIN) 10 MG tablet Take 1 tablet  by mouth daily. 90 tablet 1  ? docusate sodium (COLACE) 100 MG capsule Take 100 mg by mouth 2 (two) times daily.    ? hydrochlorothiazide (HYDRODIURIL) 12.5 MG tablet Take 1 tablet by mouth daily as needed for leg swelling 90 tablet 1  ? levocetirizine (XYZAL) 5 MG  tablet TAKE 1 TABLET BY MOUTH EVERY DAY IN THE EVENING 90 tablet 3  ? OZEMPIC, 1 MG/DOSE, 4 MG/3ML SOPN INJECT ONCE WEEKLY, SAME DAY. (Patient taking differently: 1 mg. INJECT ONCE WEEKLY, SAME DAY.) 9 mL

## 2021-04-04 NOTE — Patient Instructions (Signed)
[facility]ICAL [facility]COLOG[facility]  Discharge Instructions: Thank you for choosing [facility] to provide your oncology and hematology care.   If you have a lab appointment with the Cancer Center, please go directly to the Cancer Center and check in at the registration area.   Wear comfortable clothing and clothing appropriate for easy access to any Portacath or PICC line.   We strive to give you quality time with your provider. You may need to reschedule your appointment if you arrive late (15 or more minutes).  Arriving late affects you and other patients whose appointments are after yours.  Also, if you miss three or more appointments without notifying the office, you may be dismissed from the clinic at the provider's discretion.      For prescription refill requests, have your pharmacy contact our office and allow 72 hours for refills to be completed.    Today you received the following chemotherapy and/or immunotherapy agents carfilzomib   To help prevent nausea and vomiting after your treatment, we encourage you to take your nausea medication as directed.  BELOW ARE SYMPTOMS THAT SHOULD BE REPORTED IMMEDIATELY: . *FEVER GREATER THAN 100.4 F (38 C) OR HIGHER . *CHILLS OR SWEATING . *NAUSEA AND VOMITING THAT IS NOT CONTROLLED WITH YOUR NAUSEA MEDICATION . *UNUSUAL SHORTNESS OF BREATH . *UNUSUAL BRUISING OR BLEEDING . *URINARY PROBLEMS (pain or burning when urinating, or frequent urination) . *BOWEL PROBLEMS (unusual diarrhea, constipation, pain near the anus) . TENDERNESS IN MOUTH AND THROAT WITH OR WITHOUT PRESENCE OF ULCERS (sore throat, sores in mouth, or a toothache) . UNUSUAL RASH, SWELLING OR PAIN  . UNUSUAL VAGINAL DISCHARGE OR ITCHING   Items with * indicate a potential emergency and should be followed up as soon as possible or go to the Emergency Department if any problems should occur.  Please show the CHEMOTHERAPY ALERT CARD or IMMUNOTHERAPY ALERT  CARD at check-in to the Emergency Department and triage nurse.  Should you have questions after your visit or need to cancel or reschedule your appointment, please contact [facility]  Dept: [phone]  and follow the prompts.  Office hours are 8:00 a.m. to 4:30 p.m. Monday - Friday. Please note that voicemails left after 4:00 p.m. may not be returned until the following business day.  We are closed weekends and major holidays. You have access to a nurse at all times for urgent questions. Please call the main number to the clinic Dept: [phone] and follow the prompts.   For any non-urgent questions, you may also contact your provider using MyChart. We now offer e-Visits for anyone 18 and older to request care online for non-urgent symptoms. For details visit [email or web address].   Also download the MyChart app! Go to the app store, search "MyChart", open the app, select [facility], and log in with your MyChart username and password.  Due to Covid, a mask is required upon entering the hospital/clinic. If you do not have a mask, one will be given to you upon arrival. For doctor visits, patients may have 1 support person aged 18 or older with them. For treatment visits, patients cannot have anyone with them due to current Covid guidelines and our immunocompromised population.   

## 2021-04-05 LAB — MULTIPLE MYELOMA PANEL, SERUM
Albumin SerPl Elph-Mcnc: 3.7 g/dL (ref 2.9–4.4)
Albumin/Glob SerPl: 1.5 (ref 0.7–1.7)
Alpha 1: 0.2 g/dL (ref 0.0–0.4)
Alpha2 Glob SerPl Elph-Mcnc: 0.7 g/dL (ref 0.4–1.0)
B-Globulin SerPl Elph-Mcnc: 0.9 g/dL (ref 0.7–1.3)
Gamma Glob SerPl Elph-Mcnc: 0.8 g/dL (ref 0.4–1.8)
Globulin, Total: 2.6 g/dL (ref 2.2–3.9)
IgA: 52 mg/dL — ABNORMAL LOW (ref 87–352)
IgG (Immunoglobin G), Serum: 809 mg/dL (ref 586–1602)
IgM (Immunoglobulin M), Srm: 17 mg/dL — ABNORMAL LOW (ref 26–217)
Total Protein ELP: 6.3 g/dL (ref 6.0–8.5)

## 2021-04-11 ENCOUNTER — Encounter: Payer: Self-pay | Admitting: Hematology

## 2021-04-11 ENCOUNTER — Other Ambulatory Visit: Payer: Self-pay | Admitting: Nurse Practitioner

## 2021-04-11 DIAGNOSIS — Z1231 Encounter for screening mammogram for malignant neoplasm of breast: Secondary | ICD-10-CM

## 2021-04-12 ENCOUNTER — Other Ambulatory Visit (HOSPITAL_COMMUNITY): Payer: Self-pay

## 2021-04-12 MED ORDER — TINIDAZOLE 500 MG PO TABS
1000.0000 mg | ORAL_TABLET | Freq: Every day | ORAL | 0 refills | Status: DC
  Filled 2021-04-12: qty 10, 5d supply, fill #0

## 2021-04-12 MED ORDER — FLUCONAZOLE 150 MG PO TABS
ORAL_TABLET | ORAL | 0 refills | Status: DC
  Filled 2021-04-12: qty 3, 7d supply, fill #0

## 2021-04-16 ENCOUNTER — Other Ambulatory Visit: Payer: Self-pay | Admitting: *Deleted

## 2021-04-16 DIAGNOSIS — C9000 Multiple myeloma not having achieved remission: Secondary | ICD-10-CM

## 2021-04-17 ENCOUNTER — Other Ambulatory Visit: Payer: Self-pay

## 2021-04-17 ENCOUNTER — Inpatient Hospital Stay: Payer: 59

## 2021-04-17 DIAGNOSIS — Z5112 Encounter for antineoplastic immunotherapy: Secondary | ICD-10-CM | POA: Diagnosis not present

## 2021-04-17 DIAGNOSIS — C9000 Multiple myeloma not having achieved remission: Secondary | ICD-10-CM

## 2021-04-17 LAB — CBC WITH DIFFERENTIAL (CANCER CENTER ONLY)
Abs Immature Granulocytes: 0.01 10*3/uL (ref 0.00–0.07)
Basophils Absolute: 0 10*3/uL (ref 0.0–0.1)
Basophils Relative: 1 %
Eosinophils Absolute: 0.2 10*3/uL (ref 0.0–0.5)
Eosinophils Relative: 6 %
HCT: 34.1 % — ABNORMAL LOW (ref 36.0–46.0)
Hemoglobin: 11.6 g/dL — ABNORMAL LOW (ref 12.0–15.0)
Immature Granulocytes: 0 %
Lymphocytes Relative: 34 %
Lymphs Abs: 1.3 10*3/uL (ref 0.7–4.0)
MCH: 29.7 pg (ref 26.0–34.0)
MCHC: 34 g/dL (ref 30.0–36.0)
MCV: 87.2 fL (ref 80.0–100.0)
Monocytes Absolute: 0.5 10*3/uL (ref 0.1–1.0)
Monocytes Relative: 15 %
Neutro Abs: 1.7 10*3/uL (ref 1.7–7.7)
Neutrophils Relative %: 44 %
Platelet Count: 251 10*3/uL (ref 150–400)
RBC: 3.91 MIL/uL (ref 3.87–5.11)
RDW: 16 % — ABNORMAL HIGH (ref 11.5–15.5)
WBC Count: 3.7 10*3/uL — ABNORMAL LOW (ref 4.0–10.5)
nRBC: 0 % (ref 0.0–0.2)

## 2021-04-17 LAB — CMP (CANCER CENTER ONLY)
ALT: 14 U/L (ref 0–44)
AST: 15 U/L (ref 15–41)
Albumin: 4.1 g/dL (ref 3.5–5.0)
Alkaline Phosphatase: 43 U/L (ref 38–126)
Anion gap: 7 (ref 5–15)
BUN: 12 mg/dL (ref 6–20)
CO2: 27 mmol/L (ref 22–32)
Calcium: 9 mg/dL (ref 8.9–10.3)
Chloride: 104 mmol/L (ref 98–111)
Creatinine: 0.74 mg/dL (ref 0.44–1.00)
GFR, Estimated: >60 mL/min (ref >60)
Glucose, Bld: 101 mg/dL — ABNORMAL HIGH (ref 70–99)
Potassium: 3.6 mmol/L (ref 3.5–5.1)
Sodium: 138 mmol/L (ref 135–145)
Total Bilirubin: 0.2 mg/dL — ABNORMAL LOW (ref 0.3–1.2)
Total Protein: 6.6 g/dL (ref 6.5–8.1)

## 2021-04-18 ENCOUNTER — Inpatient Hospital Stay: Payer: 59

## 2021-04-18 VITALS — BP 114/55 | HR 68 | Temp 98.2°F | Resp 17 | Wt 162.5 lb

## 2021-04-18 DIAGNOSIS — C7951 Secondary malignant neoplasm of bone: Secondary | ICD-10-CM

## 2021-04-18 DIAGNOSIS — C9000 Multiple myeloma not having achieved remission: Secondary | ICD-10-CM

## 2021-04-18 DIAGNOSIS — Z7189 Other specified counseling: Secondary | ICD-10-CM

## 2021-04-18 DIAGNOSIS — Z5112 Encounter for antineoplastic immunotherapy: Secondary | ICD-10-CM | POA: Diagnosis not present

## 2021-04-18 MED ORDER — SODIUM CHLORIDE 0.9 % IV SOLN: Freq: Once | INTRAVENOUS | Status: DC

## 2021-04-18 MED ORDER — DEXTROSE 5 % IV SOLN
36.0000 mg/m2 | Freq: Once | INTRAVENOUS | Status: AC
  Administered 2021-04-18: 70 mg via INTRAVENOUS
  Filled 2021-04-18: qty 30

## 2021-04-18 MED ORDER — ACETAMINOPHEN 500 MG PO TABS
1000.0000 mg | ORAL_TABLET | Freq: Once | ORAL | Status: AC
  Administered 2021-04-18: 1000 mg via ORAL
  Filled 2021-04-18: qty 2

## 2021-04-18 MED ORDER — SODIUM CHLORIDE 0.9 % IV SOLN
10.0000 mg | Freq: Once | INTRAVENOUS | Status: AC
  Administered 2021-04-18: 10 mg via INTRAVENOUS
  Filled 2021-04-18: qty 10

## 2021-04-18 MED ORDER — SODIUM CHLORIDE 0.9 % IV SOLN: Freq: Once | INTRAVENOUS | Status: AC

## 2021-04-18 NOTE — Patient Instructions (Signed)
[facility]COL[facility]  Discharge Instructions: Thank you for choosing [facility] to provide your oncology and hematology care.   If you have a lab appointment with the Cancer Center, please go directly to the Cancer Center and check in at the registration area.   Wear comfortable clothing and clothing appropriate for easy access to any Portacath or PICC line.   We strive to give you quality time with your provider. You may need to reschedule your appointment if you arrive late (15 or more minutes).  Arriving late affects you and other patients whose appointments are after yours.  Also, if you miss three or more appointments without notifying the office, you may be dismissed from the clinic at the provider's discretion.      For prescription refill requests, have your pharmacy contact our office and allow 72 hours for refills to be completed.    Today you received the following chemotherapy and/or immunotherapy agents: Kyprolis    To help prevent nausea and vomiting after your treatment, we encourage you to take your nausea medication as directed.  BELOW ARE SYMPTOMS THAT SHOULD BE REPORTED IMMEDIATELY: . *FEVER GREATER THAN 100.4 F (38 C) OR HIGHER . *CHILLS OR SWEATING . *NAUSEA AND VOMITING THAT IS NOT CONTROLLED WITH YOUR NAUSEA MEDICATION . *UNUSUAL SHORTNESS OF BREATH . *UNUSUAL BRUISING OR BLEEDING . *URINARY PROBLEMS (pain or burning when urinating, or frequent urination) . *BOWEL PROBLEMS (unusual diarrhea, constipation, pain near the anus) . TENDERNESS IN MOUTH AND THROAT WITH OR WITHOUT PRESENCE OF ULCERS (sore throat, sores in mouth, or a toothache) . UNUSUAL RASH, SWELLING OR PAIN  . UNUSUAL VAGINAL DISCHARGE OR ITCHING   Items with * indicate a potential emergency and should be followed up as soon as possible or go to the Emergency Department if any problems should occur.  Please show the CHEMOTHERAPY ALERT CARD or IMMUNOTHERAPY ALERT  CARD at check-in to the Emergency Department and triage nurse.  Should you have questions after your visit or need to cancel or reschedule your appointment, please contact [facility]  Dept: [phone]  and follow the prompts.  Office hours are 8:00 a.m. to 4:30 p.m. Monday - Friday. Please note that voicemails left after 4:00 p.m. may not be returned until the following business day.  We are closed weekends and major holidays. You have access to a nurse at all times for urgent questions. Please call the main number to the clinic Dept: [phone] and follow the prompts.   For any non-urgent questions, you may also contact your provider using MyChart. We now offer e-Visits for anyone 18 and older to request care online for non-urgent symptoms. For details visit [email or web address].   Also download the MyChart app! Go to the app store, search "MyChart", open the app, select [facility], and log in with your MyChart username and password.  Due to Covid, a mask is required upon entering the hospital/clinic. If you do not have a mask, one will be given to you upon arrival. For doctor visits, patients may have 1 support person aged 18 or older with them. For treatment visits, patients cannot have anyone with them due to current Covid guidelines and our immunocompromised population.   

## 2021-04-19 ENCOUNTER — Other Ambulatory Visit: Payer: Self-pay | Admitting: Hematology

## 2021-04-19 DIAGNOSIS — C9000 Multiple myeloma not having achieved remission: Secondary | ICD-10-CM

## 2021-04-21 ENCOUNTER — Encounter: Payer: Self-pay | Admitting: Hematology

## 2021-04-26 ENCOUNTER — Other Ambulatory Visit: Payer: Self-pay | Admitting: Hematology

## 2021-04-26 ENCOUNTER — Ambulatory Visit: Payer: 59

## 2021-04-26 DIAGNOSIS — C9000 Multiple myeloma not having achieved remission: Secondary | ICD-10-CM

## 2021-04-29 ENCOUNTER — Other Ambulatory Visit (HOSPITAL_COMMUNITY): Payer: Self-pay

## 2021-04-30 ENCOUNTER — Other Ambulatory Visit: Payer: Self-pay

## 2021-04-30 DIAGNOSIS — C9000 Multiple myeloma not having achieved remission: Secondary | ICD-10-CM

## 2021-04-30 LAB — HM PAP SMEAR

## 2021-05-01 ENCOUNTER — Inpatient Hospital Stay: Payer: 59 | Attending: Hematology

## 2021-05-01 DIAGNOSIS — Z5112 Encounter for antineoplastic immunotherapy: Secondary | ICD-10-CM | POA: Insufficient documentation

## 2021-05-01 DIAGNOSIS — C9001 Multiple myeloma in remission: Secondary | ICD-10-CM | POA: Diagnosis present

## 2021-05-01 DIAGNOSIS — C9000 Multiple myeloma not having achieved remission: Secondary | ICD-10-CM

## 2021-05-01 LAB — CBC WITH DIFFERENTIAL (CANCER CENTER ONLY)
Abs Immature Granulocytes: 0.01 10*3/uL (ref 0.00–0.07)
Basophils Absolute: 0 10*3/uL (ref 0.0–0.1)
Basophils Relative: 1 %
Eosinophils Absolute: 0.1 10*3/uL (ref 0.0–0.5)
Eosinophils Relative: 3 %
HCT: 35.5 % — ABNORMAL LOW (ref 36.0–46.0)
Hemoglobin: 12 g/dL (ref 12.0–15.0)
Immature Granulocytes: 0 %
Lymphocytes Relative: 34 %
Lymphs Abs: 1.3 10*3/uL (ref 0.7–4.0)
MCH: 29.5 pg (ref 26.0–34.0)
MCHC: 33.8 g/dL (ref 30.0–36.0)
MCV: 87.2 fL (ref 80.0–100.0)
Monocytes Absolute: 0.5 10*3/uL (ref 0.1–1.0)
Monocytes Relative: 14 %
Neutro Abs: 1.9 10*3/uL (ref 1.7–7.7)
Neutrophils Relative %: 48 %
Platelet Count: 288 10*3/uL (ref 150–400)
RBC: 4.07 MIL/uL (ref 3.87–5.11)
RDW: 16.1 % — ABNORMAL HIGH (ref 11.5–15.5)
WBC Count: 3.9 10*3/uL — ABNORMAL LOW (ref 4.0–10.5)
nRBC: 0 % (ref 0.0–0.2)

## 2021-05-01 LAB — CMP (CANCER CENTER ONLY)
ALT: 12 U/L (ref 0–44)
AST: 14 U/L — ABNORMAL LOW (ref 15–41)
Albumin: 4.2 g/dL (ref 3.5–5.0)
Alkaline Phosphatase: 41 U/L (ref 38–126)
Anion gap: 7 (ref 5–15)
BUN: 17 mg/dL (ref 6–20)
CO2: 28 mmol/L (ref 22–32)
Calcium: 9.3 mg/dL (ref 8.9–10.3)
Chloride: 104 mmol/L (ref 98–111)
Creatinine: 0.61 mg/dL (ref 0.44–1.00)
GFR, Estimated: >60 mL/min (ref >60)
Glucose, Bld: 84 mg/dL (ref 70–99)
Potassium: 3.9 mmol/L (ref 3.5–5.1)
Sodium: 139 mmol/L (ref 135–145)
Total Bilirubin: 0.5 mg/dL (ref 0.3–1.2)
Total Protein: 7 g/dL (ref 6.5–8.1)

## 2021-05-02 ENCOUNTER — Other Ambulatory Visit: Payer: Self-pay

## 2021-05-02 ENCOUNTER — Other Ambulatory Visit (HOSPITAL_COMMUNITY): Payer: Self-pay | Admitting: Neurosurgery

## 2021-05-02 ENCOUNTER — Inpatient Hospital Stay: Payer: 59

## 2021-05-02 VITALS — BP 120/60 | HR 66 | Temp 98.0°F | Resp 17 | Wt 162.8 lb

## 2021-05-02 DIAGNOSIS — Z7189 Other specified counseling: Secondary | ICD-10-CM

## 2021-05-02 DIAGNOSIS — C7951 Secondary malignant neoplasm of bone: Secondary | ICD-10-CM

## 2021-05-02 DIAGNOSIS — C9031 Solitary plasmacytoma in remission: Secondary | ICD-10-CM

## 2021-05-02 DIAGNOSIS — C9000 Multiple myeloma not having achieved remission: Secondary | ICD-10-CM

## 2021-05-02 DIAGNOSIS — Z5112 Encounter for antineoplastic immunotherapy: Secondary | ICD-10-CM | POA: Diagnosis not present

## 2021-05-02 LAB — KAPPA/LAMBDA LIGHT CHAINS
Kappa free light chain: 14.7 mg/L (ref 3.3–19.4)
Kappa, lambda light chain ratio: 0.64 (ref 0.26–1.65)
Lambda free light chains: 23 mg/L (ref 5.7–26.3)

## 2021-05-02 MED ORDER — SODIUM CHLORIDE 0.9 % IV SOLN: Freq: Once | INTRAVENOUS | Status: AC

## 2021-05-02 MED ORDER — SODIUM CHLORIDE 0.9 % IV SOLN
10.0000 mg | Freq: Once | INTRAVENOUS | Status: AC
  Administered 2021-05-02: 10 mg via INTRAVENOUS
  Filled 2021-05-02: qty 10

## 2021-05-02 MED ORDER — ACETAMINOPHEN 500 MG PO TABS
1000.0000 mg | ORAL_TABLET | Freq: Once | ORAL | Status: AC
  Administered 2021-05-02: 1000 mg via ORAL
  Filled 2021-05-02: qty 2

## 2021-05-02 MED ORDER — DEXTROSE 5 % IV SOLN
36.0000 mg/m2 | Freq: Once | INTRAVENOUS | Status: AC
  Administered 2021-05-02: 70 mg via INTRAVENOUS
  Filled 2021-05-02: qty 30

## 2021-05-02 NOTE — Patient Instructions (Signed)
[facility]  Discharge Instructions: ?Thank you for choosing [facility] to provide your oncology and hematology care.  ? ?If you have a lab appointment with the [facility], please go directly to the [facility] and check in at the registration area. ?  ?Wear comfortable clothing and clothing appropriate for easy access to any Portacath or PICC line.  ? ?We strive to give you quality time with your provider. You may need to reschedule your appointment if you arrive late (15 or more minutes).  Arriving late affects you and other patients whose appointments are after yours.  Also, if you miss three or more appointments without notifying the office, you may be dismissed from the clinic at the provider?s discretion.    ?  ?For prescription refill requests, have your pharmacy contact our office and allow 72 hours for refills to be completed.   ? ?Today you received the following chemotherapy and/or immunotherapy agent: Kyprolis    ?  ?To help prevent nausea and vomiting after your treatment, we encourage you to take your nausea medication as directed. ? ?BELOW ARE SYMPTOMS THAT SHOULD BE REPORTED IMMEDIATELY: ?*FEVER GREATER THAN 100.4 F (38 ?C) OR HIGHER ?*CHILLS OR SWEATING ?*NAUSEA AND VOMITING THAT IS NOT CONTROLLED WITH YOUR NAUSEA MEDICATION ?*UNUSUAL SHORTNESS OF BREATH ?*UNUSUAL BRUISING OR BLEEDING ?*URINARY PROBLEMS (pain or burning when urinating, or frequent urination) ?*BOWEL PROBLEMS (unusual diarrhea, constipation, pain near the anus) ?TENDERNESS IN MOUTH AND THROAT WITH OR WITHOUT PRESENCE OF ULCERS (sore throat, sores in mouth, or a toothache) ?UNUSUAL RASH, SWELLING OR PAIN  ?UNUSUAL VAGINAL DISCHARGE OR ITCHING  ? ?Items with * indicate a potential emergency and should be followed up as soon as possible or go to the Emergency Department if any problems should occur. ? ?Please show the CHEMOTHERAPY ALERT CARD or IMMUNOTHERAPY ALERT CARD at check-in to  the Emergency Department and triage nurse. ? ?Should you have questions after your visit or need to cancel or reschedule your appointment, please contact [facility]  Dept: [phone]  and follow the prompts.  Office hours are 8:00 a.m. to 4:30 p.m. Monday - Friday. Please note that voicemails left after 4:00 p.m. may not be returned until the following business day.  We are closed weekends and major holidays. You have access to a nurse at all times for urgent questions. Please call the main number to the clinic Dept: [phone] and follow the prompts. ? ? ?For any non-urgent questions, you may also contact your provider using MyChart. We now offer e-Visits for anyone 65 and older to request care online for non-urgent symptoms. For details visit [email or web address]. ?  ?Also download the MyChart app! Go to the app store, search "MyChart", open the app, select [facility], and log in with your MyChart username and password. ? ?Due to Covid, a mask is required upon entering the hospital/clinic. If you do not have a mask, one will be given to you upon arrival. For doctor visits, patients may have 1 support person aged 71 or older with them. For treatment visits, patients cannot have anyone with them due to current Covid guidelines and our immunocompromised population.  ? ?

## 2021-05-03 ENCOUNTER — Ambulatory Visit (HOSPITAL_COMMUNITY)
Admission: RE | Admit: 2021-05-03 | Discharge: 2021-05-03 | Disposition: A | Discharge status: Home / Self Care | Payer: 59 | Source: Ambulatory Visit | Attending: Neurosurgery | Admitting: Neurosurgery

## 2021-05-03 DIAGNOSIS — C9031 Solitary plasmacytoma in remission: Secondary | ICD-10-CM | POA: Diagnosis not present

## 2021-05-03 LAB — MULTIPLE MYELOMA PANEL, SERUM
Albumin SerPl Elph-Mcnc: 3.7 g/dL (ref 2.9–4.4)
Albumin/Glob SerPl: 1.7 (ref 0.7–1.7)
Alpha 1: 0.2 g/dL (ref 0.0–0.4)
Alpha2 Glob SerPl Elph-Mcnc: 0.6 g/dL (ref 0.4–1.0)
B-Globulin SerPl Elph-Mcnc: 0.9 g/dL (ref 0.7–1.3)
Gamma Glob SerPl Elph-Mcnc: 0.6 g/dL (ref 0.4–1.8)
Globulin, Total: 2.3 g/dL (ref 2.2–3.9)
IgA: 42 mg/dL — ABNORMAL LOW (ref 87–352)
IgG (Immunoglobin G), Serum: 677 mg/dL (ref 586–1602)
IgM (Immunoglobulin M), Srm: 14 mg/dL — ABNORMAL LOW (ref 26–217)
Total Protein ELP: 6 g/dL (ref 6.0–8.5)

## 2021-05-03 MED ORDER — GADOBUTROL 1 MMOL/ML IV SOLN
7.0000 mL | Freq: Once | INTRAVENOUS | Status: AC | PRN
  Administered 2021-05-03: 7 mL via INTRAVENOUS

## 2021-05-06 ENCOUNTER — Other Ambulatory Visit: Payer: Self-pay | Admitting: Radiation Therapy

## 2021-05-10 ENCOUNTER — Other Ambulatory Visit: Payer: Self-pay | Admitting: Hematology

## 2021-05-13 ENCOUNTER — Telehealth: Payer: Self-pay | Admitting: Hematology

## 2021-05-13 ENCOUNTER — Ambulatory Visit
Admission: RE | Admit: 2021-05-13 | Discharge: 2021-05-13 | Disposition: A | Discharge status: Home / Self Care | Payer: 59 | Source: Ambulatory Visit | Attending: Nurse Practitioner | Admitting: Nurse Practitioner

## 2021-05-13 ENCOUNTER — Inpatient Hospital Stay: Payer: 59

## 2021-05-13 DIAGNOSIS — Z1231 Encounter for screening mammogram for malignant neoplasm of breast: Secondary | ICD-10-CM

## 2021-05-13 NOTE — Telephone Encounter (Signed)
.  Called patient to schedule appointment per 4/21 inbasket, patient is aware of date and time.  Pt is aware her appt time is last available of the day ?

## 2021-05-14 ENCOUNTER — Other Ambulatory Visit: Payer: Self-pay | Admitting: Hematology

## 2021-05-14 ENCOUNTER — Other Ambulatory Visit: Payer: Self-pay

## 2021-05-14 DIAGNOSIS — C9000 Multiple myeloma not having achieved remission: Secondary | ICD-10-CM

## 2021-05-15 ENCOUNTER — Telehealth: Payer: Self-pay | Admitting: Radiation Therapy

## 2021-05-15 ENCOUNTER — Inpatient Hospital Stay: Payer: 59

## 2021-05-15 DIAGNOSIS — Z5112 Encounter for antineoplastic immunotherapy: Secondary | ICD-10-CM | POA: Diagnosis not present

## 2021-05-15 DIAGNOSIS — C9000 Multiple myeloma not having achieved remission: Secondary | ICD-10-CM

## 2021-05-15 LAB — CBC WITH DIFFERENTIAL (CANCER CENTER ONLY)
Abs Immature Granulocytes: 0 10*3/uL (ref 0.00–0.07)
Basophils Absolute: 0 10*3/uL (ref 0.0–0.1)
Basophils Relative: 1 %
Eosinophils Absolute: 0.3 10*3/uL (ref 0.0–0.5)
Eosinophils Relative: 6 %
HCT: 34.5 % — ABNORMAL LOW (ref 36.0–46.0)
Hemoglobin: 11.6 g/dL — ABNORMAL LOW (ref 12.0–15.0)
Immature Granulocytes: 0 %
Lymphocytes Relative: 33 %
Lymphs Abs: 1.4 10*3/uL (ref 0.7–4.0)
MCH: 29.9 pg (ref 26.0–34.0)
MCHC: 33.6 g/dL (ref 30.0–36.0)
MCV: 88.9 fL (ref 80.0–100.0)
Monocytes Absolute: 0.6 10*3/uL (ref 0.1–1.0)
Monocytes Relative: 14 %
Neutro Abs: 1.9 10*3/uL (ref 1.7–7.7)
Neutrophils Relative %: 46 %
Platelet Count: 220 10*3/uL (ref 150–400)
RBC: 3.88 MIL/uL (ref 3.87–5.11)
RDW: 16.4 % — ABNORMAL HIGH (ref 11.5–15.5)
WBC Count: 4.2 10*3/uL (ref 4.0–10.5)
nRBC: 0 % (ref 0.0–0.2)

## 2021-05-15 LAB — CMP (CANCER CENTER ONLY)
ALT: 13 U/L (ref 0–44)
AST: 13 U/L — ABNORMAL LOW (ref 15–41)
Albumin: 4.1 g/dL (ref 3.5–5.0)
Alkaline Phosphatase: 38 U/L (ref 38–126)
Anion gap: 5 (ref 5–15)
BUN: 14 mg/dL (ref 6–20)
CO2: 30 mmol/L (ref 22–32)
Calcium: 8.8 mg/dL — ABNORMAL LOW (ref 8.9–10.3)
Chloride: 105 mmol/L (ref 98–111)
Creatinine: 0.77 mg/dL (ref 0.44–1.00)
GFR, Estimated: >60 mL/min (ref >60)
Glucose, Bld: 71 mg/dL (ref 70–99)
Potassium: 3.8 mmol/L (ref 3.5–5.1)
Sodium: 140 mmol/L (ref 135–145)
Total Bilirubin: 0.7 mg/dL (ref 0.3–1.2)
Total Protein: 6.5 g/dL (ref 6.5–8.1)

## 2021-05-15 NOTE — Telephone Encounter (Signed)
Left a voicemail for the patient requesting a call back to set up a consult appointment with Dr. Tammi Klippel on 5/1.  ? ?Mont Dutton R.T.(R)(T) ?Radiation Special Procedures Navigator  ?

## 2021-05-15 NOTE — Telephone Encounter (Addendum)
Pt called back to confirm consult and SIM visits for 05/20/21. She has requested that her daily treatments be after 3:30 if possible. I have made a note in her [facility] appointment with this request.  ? ?Mont Dutton R.T.(R)(T) ?Radiation Special Procedures Navigator  ?

## 2021-05-15 NOTE — Progress Notes (Signed)
Histology and Location of Primary Cancer: Multiple Myeloma ? ?Associate Dx:  Malignant neoplasm metastatic to bone ? ?Past/Anticipated chemotherapy by medical oncology, if any:  ?Treatment:  Planned to start 05/16/2021  Carfilzomib (Kyprolis) 70 mg in dextrose 5% 100 ml chemo infusion ? ?Pain on a scale of 0-10 is:  0/10 ? ? ?If Spine Met(s), symptoms, if any, include: ?Bowel/Bladder retention or incontinence (please describe):  Constipation-Docusate take as needed.   ?Numbness or weakness in extremities (please describe): No ?Current Decadron regimen, if applicable: Only when does infusion every other week and it's 10 mg. ? ?Ambulatory status? Walker? Wheelchair?: No ? ?SAFETY ISSUES: ?Prior radiation? No ?Pacemaker/ICD? No ?Possible current pregnancy? No ?Is the patient on methotrexate? No ? ?Current Complaints / other details:   ?Watery vaginal discharge wears panty shield, Pap smear done last month, not sure if it's the medication or not. ?

## 2021-05-16 ENCOUNTER — Inpatient Hospital Stay: Payer: 59

## 2021-05-16 ENCOUNTER — Other Ambulatory Visit: Payer: Self-pay

## 2021-05-16 ENCOUNTER — Encounter: Payer: Self-pay | Admitting: Hematology

## 2021-05-16 VITALS — BP 103/72 | HR 67 | Temp 98.9°F | Resp 18 | Wt 162.0 lb

## 2021-05-16 DIAGNOSIS — Z5112 Encounter for antineoplastic immunotherapy: Secondary | ICD-10-CM | POA: Diagnosis not present

## 2021-05-16 DIAGNOSIS — C7951 Secondary malignant neoplasm of bone: Secondary | ICD-10-CM

## 2021-05-16 DIAGNOSIS — C9000 Multiple myeloma not having achieved remission: Secondary | ICD-10-CM

## 2021-05-16 DIAGNOSIS — Z7189 Other specified counseling: Secondary | ICD-10-CM

## 2021-05-16 MED ORDER — SODIUM CHLORIDE 0.9 % IV SOLN: Freq: Once | INTRAVENOUS | Status: AC

## 2021-05-16 MED ORDER — SODIUM CHLORIDE 0.9 % IV SOLN
10.0000 mg | Freq: Once | INTRAVENOUS | Status: AC
  Administered 2021-05-16: 10 mg via INTRAVENOUS
  Filled 2021-05-16: qty 10

## 2021-05-16 MED ORDER — ACETAMINOPHEN 500 MG PO TABS
1000.0000 mg | ORAL_TABLET | Freq: Once | ORAL | Status: AC
  Administered 2021-05-16: 1000 mg via ORAL
  Filled 2021-05-16: qty 2

## 2021-05-16 MED ORDER — DEXTROSE 5 % IV SOLN
36.0000 mg/m2 | Freq: Once | INTRAVENOUS | Status: AC
  Administered 2021-05-16: 70 mg via INTRAVENOUS
  Filled 2021-05-16: qty 30

## 2021-05-16 NOTE — Patient Instructions (Signed)
[facility]COL[facility]  Discharge Instructions: Thank you for choosing [facility] to provide your oncology and hematology care.   If you have a lab appointment with the Cancer Center, please go directly to the Cancer Center and check in at the registration area.   Wear comfortable clothing and clothing appropriate for easy access to any Portacath or PICC line.   We strive to give you quality time with your provider. You may need to reschedule your appointment if you arrive late (15 or more minutes).  Arriving late affects you and other patients whose appointments are after yours.  Also, if you miss three or more appointments without notifying the office, you may be dismissed from the clinic at the provider's discretion.      For prescription refill requests, have your pharmacy contact our office and allow 72 hours for refills to be completed.    Today you received the following chemotherapy and/or immunotherapy agents: Kyprolis    To help prevent nausea and vomiting after your treatment, we encourage you to take your nausea medication as directed.  BELOW ARE SYMPTOMS THAT SHOULD BE REPORTED IMMEDIATELY: . *FEVER GREATER THAN 100.4 F (38 C) OR HIGHER . *CHILLS OR SWEATING . *NAUSEA AND VOMITING THAT IS NOT CONTROLLED WITH YOUR NAUSEA MEDICATION . *UNUSUAL SHORTNESS OF BREATH . *UNUSUAL BRUISING OR BLEEDING . *URINARY PROBLEMS (pain or burning when urinating, or frequent urination) . *BOWEL PROBLEMS (unusual diarrhea, constipation, pain near the anus) . TENDERNESS IN MOUTH AND THROAT WITH OR WITHOUT PRESENCE OF ULCERS (sore throat, sores in mouth, or a toothache) . UNUSUAL RASH, SWELLING OR PAIN  . UNUSUAL VAGINAL DISCHARGE OR ITCHING   Items with * indicate a potential emergency and should be followed up as soon as possible or go to the Emergency Department if any problems should occur.  Please show the CHEMOTHERAPY ALERT CARD or IMMUNOTHERAPY ALERT  CARD at check-in to the Emergency Department and triage nurse.  Should you have questions after your visit or need to cancel or reschedule your appointment, please contact [facility]  Dept: [phone]  and follow the prompts.  Office hours are 8:00 a.m. to 4:30 p.m. Monday - Friday. Please note that voicemails left after 4:00 p.m. may not be returned until the following business day.  We are closed weekends and major holidays. You have access to a nurse at all times for urgent questions. Please call the main number to the clinic Dept: [phone] and follow the prompts.   For any non-urgent questions, you may also contact your provider using MyChart. We now offer e-Visits for anyone 18 and older to request care online for non-urgent symptoms. For details visit [email or web address].   Also download the MyChart app! Go to the app store, search "MyChart", open the app, select [facility], and log in with your MyChart username and password.  Due to Covid, a mask is required upon entering the hospital/clinic. If you do not have a mask, one will be given to you upon arrival. For doctor visits, patients may have 1 support person aged 18 or older with them. For treatment visits, patients cannot have anyone with them due to current Covid guidelines and our immunocompromised population.   

## 2021-05-17 ENCOUNTER — Other Ambulatory Visit (HOSPITAL_COMMUNITY): Payer: Self-pay

## 2021-05-18 NOTE — Progress Notes (Signed)
?[facility]         [phone]) [phone] ?________________________________ ? ?Initial outpatient Consultation ? ?(Same day simulation) ? ? ? ?Name: Alexandra [name] MRN: [id number]  ?Date: 05/20/2021  DOB: 11/29/1965 ? ?PRIMARY CARE:  [name], FNP ? ?REFERRING PHYSICIAN: Consuella Lose, MD ? ?ONCOLOGISTS:  Sullivan Lone, MD ([facility]) and Jeanann Lewandowsky, MD ([facility]) ? 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 ?  ICD-10-CM   ?1. Multiple myeloma in relapse [facility])  C90.02   ?  ? ? ?HISTORY OF PRESENT ILLNESS::Alexandra [name] is a 56 y.o. female who noticed the beginnings of a growth on the left side of her forehead mid June 2021. She went to her PCP because the lump continued to grow and was initially treated as an insect bite. The patient was referred to Dr. Harlow Mares, plastic surgeon, With a presumptive diagnosis of lipoma she was taken to the operating room at the outpatient surgical center by plastic surgery for removal of the lipoma however intraoperatively found to have a deeper lesion likely extending through the frontal bone.  The procedure was therefore aborted, and MRI on 10/05/19 scan was completed which confirmed an erosive left frontal lesion extending into the intracranial cavity.   ? ? ? ?The lesion was resected by craniectomy with Dr. Kathyrn Sheriff on 10/10/19 and identified as a plasma cell neoplasm. She returned after a custom cranioplasty flap was fabricated and underwent cranioplasty on 10/14/19.  Myeloma workup showed negative PET/CT, with + anemia. She had no serum M spike but elevated lambda light chains and Bence Jones + 24 hour urine. No renal failure or hypercalcemia. Pt was subsequently started on carfilzomib (Kyprolis), lenalidomide (Revlimid), and dexamethasone (KRd) with Dr. Irene Limbo started 12/05/19 completing 7 cycles and went to Hematopoietic Stem Cell Transplantation on 07/10/20 at [facility].  Re-staging PET-CT as recently as 01/16/21 showed no  recurrence. ? ?Recently, she presented with an enlarging lump in the left temple region.  Brain MRI on 05/03/21 confirmed a new nodule with features of dense cellularity by T2 and diffusion imaging in the anterior left temporalis, 21 mm on axial images. This is along the inferior margin of the craniectomy. No visible invasion of the lateral wall left orbit. ? ? ? ?She has been referred today for consideration of radiotherapy. ? ? ?PREVIOUS RADIATION THERAPY: No ? ?Past Medical History:  ?Diagnosis Date  ? Anxiety   ? Hypertension   ? Pre-diabetes   ?: ? ? ?Past Surgical History:  ?Procedure Laterality Date  ? ABLATION    ? APPLICATION OF CRANIAL NAVIGATION N/A 10/10/2019  ? Procedure: APPLICATION OF CRANIAL NAVIGATION;  Surgeon: Consuella Lose, MD;  Location: [facility];  Service: Neurosurgery;  Laterality: N/A;  APPLICATION OF CRANIAL NAVIGATION  ? CRANIOPLASTY Left 10/14/2019  ? Procedure: LEFT CRANIOPLASTY WITH PLACEMENT OF CUSTOM CRANIAL IMPLANT;  Surgeon: Consuella Lose, MD;  Location: [facility];  Service: Neurosurgery;  Laterality: Left;  ? CRANIOTOMY Left 10/10/2019  ? Procedure: Left frontal Stereotactic craniectomy for resection of tumor;  Surgeon: Consuella Lose, MD;  Location: [facility];  Service: Neurosurgery;  Laterality: Left;  Left frontal Stereotactic craniectomy for resection of tumor  ? HAMMER TOE SURGERY Left   ? IR IMAGING GUIDED PORT INSERTION  12/01/2019  ? TUBAL LIGATION    ?: ? ? ?Current Outpatient Medications:  ?  atorvastatin (LIPITOR) 10 MG tablet, Take 1 tablet (10 mg total) by mouth daily., Disp: 30 tablet, Rfl: 1 ?  benazepril (  LOTENSIN) 10 MG tablet, Take 1 tablet  by mouth daily., Disp: 90 tablet, Rfl: 1 ?  docusate sodium (COLACE) 100 MG capsule, Take 100 mg by mouth 2 (two) times daily., Disp: , Rfl:  ?  hydrochlorothiazide (HYDRODIURIL) 12.5 MG tablet, Take 1 tablet by mouth daily as needed for leg swelling, Disp: 90 tablet, Rfl: 1 ?  lenalidomide (REVLIMID) 10 MG capsule, TAKE 1  CAPSULE BY MOUTH 1 TIME A DAY FOR 21 DAYS ON THEN 7 DAYS OFF, Disp: 21 capsule, Rfl: 0 ?  levocetirizine (XYZAL) 5 MG tablet, TAKE 1 TABLET BY MOUTH EVERY DAY IN THE EVENING, Disp: 90 tablet, Rfl: 3 ?  OZEMPIC, 1 MG/DOSE, 4 MG/3ML SOPN, INJECT ONCE WEEKLY, SAME DAY. (Patient taking differently: 1 mg. INJECT ONCE WEEKLY, SAME DAY.), Disp: 9 mL, Rfl: 1 ?  traZODone (DESYREL) 50 MG tablet, TAKE 1 TO 2 TABLETS BY MOUTH AT BEDTIME AS NEEDED FOR SLEEP, Disp: 180 tablet, Rfl: 1 ?  valACYclovir (VALTREX) 500 MG tablet, Take 1 tablet by mouth 2 times a day, Disp: 60 tablet, Rfl: 10: ? ?No Known Allergies: ? ? ?Family History  ?Problem Relation Age of Onset  ? Breast cancer Maternal Grandmother   ? Heart disease Mother   ? Hypertension Mother   ? Sarcoidosis Father   ? Diabetes Maternal Grandfather   ?: ? ? ?Social History  ? ?Socioeconomic History  ? Marital status: Single  ?  Spouse name: Not on file  ? Number of children: Not on file  ? Years of education: Not on file  ? Highest education level: 12th grade  ?Occupational History  ? Not on file  ?Tobacco Use  ? Smoking status: Never  ? Smokeless tobacco: Never  ?Vaping Use  ? Vaping Use: Never used  ?Substance and Sexual Activity  ? Alcohol use: Yes  ?  Comment: occassionally  ? Drug use: Not Currently  ? Sexual activity: Not on file  ?Other Topics Concern  ? Not on file  ?Social History Narrative  ? Not on file  ? ?Social Determinants of Health  ? ?Financial Resource Strain: Medium Risk  ? Difficulty of Paying Living Expenses: Somewhat hard  ?Food Insecurity: Food Insecurity Present  ? Worried About Charity fundraiser in the Last Year: Sometimes true  ? Ran Out of Food in the Last Year: Never true  ?Transportation Needs: Unmet Transportation Needs  ? Lack of Transportation (Medical): No  ? Lack of Transportation (Non-Medical): Yes  ?Physical Activity: Sufficiently Active  ? Days of Exercise per Week: 5 days  ? Minutes of Exercise per Session: 30 min  ?Stress: No Stress  Concern Present  ? Feeling of Stress : Only a little  ?Social Connections: Moderately Integrated  ? Frequency of Communication with Friends and Family: More than three times a week  ? Frequency of Social Gatherings with Friends and Family: Once a week  ? Attends Religious Services: More than 4 times per year  ? Active Member of Clubs or Organizations: Yes  ? Attends Archivist Meetings: 1 to 4 times per year  ? Marital Status: Divorced  ?Intimate Partner Violence: Not on file  ?: ? ?REVIEW OF SYSTEMS:  A 15 point review of systems is documented in the electronic medical record. This was obtained by the nursing staff. However, I reviewed this with the patient to discuss relevant findings and make appropriate changes.  Pertinent items are noted in HPI. ?  ?PHYSICAL EXAM:  ?Last menstrual period 07/09/2015. ?  General appearance: alert, cooperative, and appears stated age ?Head: atraumatic, gentle palpation of the forehead revealed no tenderness or nodules around the borders of the cranioplasty, except subjacent to the edge of the cranioplasty in the left temporal fossa abutting the lateral orbit, there was a soft non-tender 2 cm nodule non-mobile.  The overlying skin was not adherent to the nodule or abnormal. ?Eyes: negative findings: conjunctivae and sclerae normal and pupils equal, round, reactive to light and accomodation  Vision grossly intact ? ?KPS = 90 ? ?100 - Normal; no complaints; no evidence of disease. ?90   - Able to carry on normal activity; minor signs or symptoms of disease. ?80   - Normal activity with effort; some signs or symptoms of disease. ?51   - Cares for self; unable to carry on normal activity or to do active work. ?60   - Requires occasional assistance, but is able to care for most of his personal needs. ?50   - Requires considerable assistance and frequent medical care. ?71   - Disabled; requires special care and assistance. ?30   - Severely disabled; hospital admission is  indicated although death not imminent. ?20   - Very sick; hospital admission necessary; active supportive treatment necessary. ?10   - Moribund; fatal processes progressing rapidly. ?0     - Dead ? ?Karnofsky DA, Leanne Lovely

## 2021-05-20 ENCOUNTER — Other Ambulatory Visit: Payer: Self-pay | Admitting: Radiation Therapy

## 2021-05-20 ENCOUNTER — Ambulatory Visit: Payer: 59 | Admitting: Radiation Oncology

## 2021-05-20 ENCOUNTER — Ambulatory Visit
Admission: RE | Admit: 2021-05-20 | Discharge: 2021-05-20 | Disposition: A | Discharge status: Home / Self Care | Payer: 59 | Source: Ambulatory Visit | Attending: Radiation Oncology | Admitting: Radiation Oncology

## 2021-05-20 ENCOUNTER — Other Ambulatory Visit: Payer: Self-pay

## 2021-05-20 VITALS — BP 102/77 | HR 68 | Temp 97.6°F | Resp 20 | Ht 66.9 in | Wt 162.8 lb

## 2021-05-20 DIAGNOSIS — K5732 Diverticulitis of large intestine without perforation or abscess without bleeding: Secondary | ICD-10-CM | POA: Insufficient documentation

## 2021-05-20 DIAGNOSIS — Z79899 Other long term (current) drug therapy: Secondary | ICD-10-CM | POA: Insufficient documentation

## 2021-05-20 DIAGNOSIS — C9002 Multiple myeloma in relapse: Secondary | ICD-10-CM

## 2021-05-20 DIAGNOSIS — Z5111 Encounter for antineoplastic chemotherapy: Secondary | ICD-10-CM | POA: Diagnosis not present

## 2021-05-20 DIAGNOSIS — I10 Essential (primary) hypertension: Secondary | ICD-10-CM | POA: Insufficient documentation

## 2021-05-20 DIAGNOSIS — Z23 Encounter for immunization: Secondary | ICD-10-CM | POA: Insufficient documentation

## 2021-05-20 DIAGNOSIS — Z803 Family history of malignant neoplasm of breast: Secondary | ICD-10-CM | POA: Insufficient documentation

## 2021-05-20 DIAGNOSIS — Z510 Encounter for antineoplastic radiation therapy: Secondary | ICD-10-CM | POA: Insufficient documentation

## 2021-05-20 DIAGNOSIS — R1013 Epigastric pain: Secondary | ICD-10-CM | POA: Insufficient documentation

## 2021-05-20 DIAGNOSIS — K5900 Constipation, unspecified: Secondary | ICD-10-CM | POA: Insufficient documentation

## 2021-05-20 NOTE — Addendum Note (Signed)
Addended by: Pincus Large on: 05/20/2021 09:57 AM ? ? Modules accepted: Orders ? ?

## 2021-05-21 NOTE — Progress Notes (Signed)
?  Radiation Oncology         [phone] ?________________________________ ? ?Name: Alexandra [name] MRN: [id number]  ?Date: 05/20/2021  DOB: 09/19/1965 ? ?SIMULATION AND TREATMENT PLANNING NOTE ? ?  ICD-10-CM   ?1. Multiple myeloma in relapse (HCC)  C90.02   ?  ? ? 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 ?NARRATIVE:  The patient was brought to the [facility].  Identity was confirmed.  All relevant records and images related to the planned course of therapy were reviewed.  The patient freely provided informed written consent to proceed with treatment after reviewing the details related to the planned course of therapy. The consent form was witnessed and verified by the simulation staff.  Then, the patient was set-up in a stable reproducible  supine position for radiation therapy.  CT images were obtained.  Surface markings were placed.  The CT images were loaded into the planning software.  Then the target and avoidance structures were contoured.  Treatment planning then occurred.  The radiation prescription was entered and confirmed.   ? ?Then, I designed and supervised the construction of a total of 2 medically necessary complex treatment devices including molded accuform cushion and aquaplast mask.   ? ?I have requested : Intensity Modulated Radiotherapy (IMRT) is medically necessary for this case for the following reason:  Critical CNS structure avoidance - brainstem, optic chiasm, optic nerve. ? ?PLAN:  The patient will receive 30 Gy in 10 fractions to the recurrent tumor nodule and 20 Gy to the cranioplasty surgical bed. ? ?________________________________ ? ?Sheral Apley Tammi Klippel, M.D. ? ?

## 2021-05-22 ENCOUNTER — Other Ambulatory Visit: Payer: Self-pay

## 2021-05-22 ENCOUNTER — Other Ambulatory Visit (HOSPITAL_COMMUNITY): Payer: Self-pay

## 2021-05-22 ENCOUNTER — Inpatient Hospital Stay: Payer: 59

## 2021-05-22 VITALS — BP 102/70 | HR 60 | Temp 98.7°F | Resp 16

## 2021-05-22 DIAGNOSIS — Z7189 Other specified counseling: Secondary | ICD-10-CM

## 2021-05-22 DIAGNOSIS — Z5112 Encounter for antineoplastic immunotherapy: Secondary | ICD-10-CM | POA: Insufficient documentation

## 2021-05-22 DIAGNOSIS — C9001 Multiple myeloma in remission: Secondary | ICD-10-CM | POA: Insufficient documentation

## 2021-05-22 DIAGNOSIS — C7951 Secondary malignant neoplasm of bone: Secondary | ICD-10-CM

## 2021-05-22 DIAGNOSIS — Z23 Encounter for immunization: Secondary | ICD-10-CM | POA: Insufficient documentation

## 2021-05-22 DIAGNOSIS — Z510 Encounter for antineoplastic radiation therapy: Secondary | ICD-10-CM | POA: Diagnosis not present

## 2021-05-22 DIAGNOSIS — C9000 Multiple myeloma not having achieved remission: Secondary | ICD-10-CM

## 2021-05-22 MED ORDER — MENINGOCOCCAL A C Y&W-135 OLIG IM SOLR
0.5000 mL | Freq: Once | INTRAMUSCULAR | Status: AC
  Administered 2021-05-22: 0.5 mL via INTRAMUSCULAR
  Filled 2021-05-22: qty 0.5

## 2021-05-22 MED ORDER — PNEUMOCOCCAL 13-VAL CONJ VACC IM SUSP
0.5000 mL | Freq: Once | INTRAMUSCULAR | Status: AC
  Administered 2021-05-22: 0.5 mL via INTRAMUSCULAR
  Filled 2021-05-22: qty 0.5

## 2021-05-22 MED ORDER — DTAP-IPV-HIB VACCINE IM SUSR
0.5000 mL | Freq: Once | INTRAMUSCULAR | Status: AC
  Administered 2021-05-22: 0.5 mL via INTRAMUSCULAR
  Filled 2021-05-22: qty 1

## 2021-05-22 MED ORDER — HEPATITIS B VAC RECOMBINANT 20 MCG/ML IJ SUSP
2.0000 mL | Freq: Once | INTRAMUSCULAR | Status: AC
  Administered 2021-05-22: 40 ug via INTRAMUSCULAR
  Filled 2021-05-22: qty 2

## 2021-05-22 NOTE — Patient Instructions (Signed)
Tdap (Tetanus, Diphtheria, Pertussis) Vaccine: What You Need to Know 1. Why get vaccinated? Tdap vaccine can prevent tetanus, diphtheria, and pertussis. Diphtheria and pertussis spread from person to person. Tetanus enters the body through cuts or wounds. TETANUS (T) causes painful stiffening of the muscles. Tetanus can lead to serious health problems, including being unable to open the mouth, having trouble swallowing and breathing, or death. DIPHTHERIA (D) can lead to difficulty breathing, heart failure, paralysis, or death. PERTUSSIS (aP), also known as "whooping cough," can cause uncontrollable, violent coughing that makes it hard to breathe, eat, or drink. Pertussis can be extremely serious especially in babies and young children, causing pneumonia, convulsions, brain damage, or death. In teens and adults, it can cause weight loss, loss of bladder control, passing out, and rib fractures from severe coughing. 2. Tdap vaccine Tdap is only for children 7 years and older, adolescents, and adults.  Adolescents should receive a single dose of Tdap, preferably at age 11 or 12 years. Pregnant people should get a dose of Tdap during every pregnancy, preferably during the early part of the third trimester, to help protect the newborn from pertussis. Infants are most at risk for severe, life-threatening complications from pertussis. Adults who have never received Tdap should get a dose of Tdap. Also, adults should receive a booster dose of either Tdap or Td (a different vaccine that protects against tetanus and diphtheria but not pertussis) every 10 years, or after 5 years in the case of a severe or dirty wound or burn. Tdap may be given at the same time as other vaccines. 3. Talk with your health care provider Tell your vaccine provider if the person getting the vaccine: Has had an allergic reaction after a previous dose of any vaccine that protects against tetanus, diphtheria, or pertussis, or has any  severe, life-threatening allergies Has had a coma, decreased level of consciousness, or prolonged seizures within 7 days after a previous dose of any pertussis vaccine (DTP, DTaP, or Tdap) Has seizures or another nervous system problem Has ever had Guillain-Barr Syndrome (also called "GBS") Has had severe pain or swelling after a previous dose of any vaccine that protects against tetanus or diphtheria In some cases, your health care provider may decide to postpone Tdap vaccination until a future visit. People with minor illnesses, such as a cold, may be vaccinated. People who are moderately or severely ill should usually wait until they recover before getting Tdap vaccine.  Your health care provider can give you more information. 4. Risks of a vaccine reaction Pain, redness, or swelling where the shot was given, mild fever, headache, feeling tired, and nausea, vomiting, diarrhea, or stomachache sometimes happen after Tdap vaccination. People sometimes faint after medical procedures, including vaccination. Tell your provider if you feel dizzy or have vision changes or ringing in the ears.  As with any medicine, there is a very remote chance of a vaccine causing a severe allergic reaction, other serious injury, or death. 5. What if there is a serious problem? An allergic reaction could occur after the vaccinated person leaves the clinic. If you see signs of a severe allergic reaction (hives, swelling of the face and throat, difficulty breathing, a fast heartbeat, dizziness, or weakness), call 9-1-1 and get the person to the nearest hospital. For other signs that concern you, call your health care provider.  Adverse reactions should be reported to the Vaccine Adverse Event Reporting System (VAERS). Your health care provider will usually file this report, or you   can do it yourself. Visit the VAERS website at www.vaers.SamedayNews.es or call (630)620-3120. VAERS is only for reporting reactions, and VAERS staff  members do not give medical advice. ?6. The National Vaccine Injury Compensation Program ?The National Vaccine Injury Compensation Program (VICP) is a federal program that was created to compensate people who may have been injured by certain vaccines. Claims regarding alleged injury or death due to vaccination have a time limit for filing, which may be as short as two years. Visit the VICP website at GoldCloset.com.ee or call 952-028-0634 to learn about the program and about filing a claim. ?7. How can I learn more? ?Ask your health care provider. ?Call your local or state health department. ?Visit the website of the Food and Drug Administration (FDA) for vaccine package inserts and additional information at TraderRating.uy. ?Contact the Centers for Disease Control and Prevention (CDC): ?Call (845) 320-2303 (1-800-CDC-INFO) or ?Visit CDC's website at http://hunter.com/. ?Source: CDC Vaccine Information Statement Tdap (Tetanus, Diphtheria, Pertussis) Vaccine (08/26/2019) ?This same material is available at http://www.wolf.info/ for no charge. ?This information is not intended to replace advice given to you by your health care provider. Make sure you discuss any questions you have with your health care provider. ? ?Pneumococcal Conjugate Vaccine (Prevnar 13) Suspension for Injection ?What is this medication? ?PNEUMOCOCCAL VACCINE (NEU mo KOK al vak SEEN) is a vaccine used to prevent pneumococcus bacterial infections. These bacteria can cause serious infections like pneumonia, meningitis, and blood infections. This vaccine will lower your chance of getting pneumonia. If you do get pneumonia, it can make your symptoms milder and your illness shorter. This vaccine will not treat an infection and will not cause infection. This vaccine is recommended for infants and young children, adults with certain medical conditions, and adults 75 years or older. ?This medicine may be used for  other purposes; ask your health care provider or pharmacist if you have questions. ?COMMON BRAND NAME(S): Prevnar, Prevnar 13 ?What should I tell my care team before I take this medication? ?They need to know if you have any of these conditions: ?bleeding problems ?fever ?immune system problems ?an unusual or allergic reaction to pneumococcal vaccine, diphtheria toxoid, other vaccines, latex, other medicines, foods, dyes, or preservatives ?pregnant or trying to get pregnant ?breast-feeding ?How should I use this medication? ?This vaccine is for injection into a muscle. It is given by a health care professional. ?A copy of Vaccine Information Statements will be given before each vaccination. Read this sheet carefully each time. The sheet may change frequently. ?Talk to your pediatrician regarding the use of this medicine in children. While this drug may be prescribed for children as young as 24 weeks old for selected conditions, precautions do apply. ?Overdosage: If you think you have taken too much of this medicine contact a poison control center or emergency room at once. ?NOTE: This medicine is only for you. Do not share this medicine with others. ?What if I miss a dose? ?It is important not to miss your dose. Call your doctor or health care professional if you are unable to keep an appointment. ?What may interact with this medication? ?medicines for cancer chemotherapy ?medicines that suppress your immune function ?steroid medicines like prednisone or cortisone ?This list may not describe all possible interactions. Give your health care provider a list of all the medicines, herbs, non-prescription drugs, or dietary supplements you use. Also tell them if you smoke, drink alcohol, or use illegal drugs. Some items may interact with your medicine. ?What should I  watch for while using this medication? ?Mild fever and pain should go away in 3 days or less. Report any unusual symptoms to your doctor or health care  professional. ?What side effects may I notice from receiving this medication? ?Side effects that you should report to your doctor or health care professional as soon as possible: ?allergic reactions like skin ra

## 2021-05-22 NOTE — Progress Notes (Addendum)
Patient to start 6 month post-tx vaccines today. ?She received Prevnar 20 at her PCP office in Feb 2023. Will proceed with Prevnar 13 in her post-tx vaccine series as per [facility] recommendations. Increase Energix-B dose to 40 mcg. Add Menveo. ? ?[name], [place], BCPs, BCOP ?05/22/2021 ?11:53 AM ? ?

## 2021-05-23 DIAGNOSIS — Z510 Encounter for antineoplastic radiation therapy: Secondary | ICD-10-CM | POA: Diagnosis not present

## 2021-05-24 ENCOUNTER — Ambulatory Visit (HOSPITAL_COMMUNITY)
Admission: RE | Admit: 2021-05-24 | Discharge: 2021-05-24 | Disposition: A | Discharge status: Home / Self Care | Payer: 59 | Source: Ambulatory Visit | Attending: Radiation Oncology | Admitting: Radiation Oncology

## 2021-05-24 ENCOUNTER — Other Ambulatory Visit (HOSPITAL_COMMUNITY): Payer: Self-pay

## 2021-05-24 DIAGNOSIS — C9002 Multiple myeloma in relapse: Secondary | ICD-10-CM | POA: Insufficient documentation

## 2021-05-24 LAB — GLUCOSE, CAPILLARY: Glucose-Capillary: 92 mg/dL (ref 70–99)

## 2021-05-24 MED ORDER — FLUDEOXYGLUCOSE F - 18 (FDG) INJECTION
8.1000 | Freq: Once | INTRAVENOUS | Status: AC
  Administered 2021-05-24: 8.1 via INTRAVENOUS

## 2021-05-28 ENCOUNTER — Ambulatory Visit: Payer: 59

## 2021-05-29 ENCOUNTER — Ambulatory Visit: Payer: 59

## 2021-05-29 ENCOUNTER — Inpatient Hospital Stay: Payer: 59

## 2021-05-29 ENCOUNTER — Other Ambulatory Visit: Payer: Self-pay

## 2021-05-29 ENCOUNTER — Ambulatory Visit (HOSPITAL_COMMUNITY): Payer: 59

## 2021-05-29 DIAGNOSIS — C9000 Multiple myeloma not having achieved remission: Secondary | ICD-10-CM

## 2021-05-29 DIAGNOSIS — Z510 Encounter for antineoplastic radiation therapy: Secondary | ICD-10-CM | POA: Diagnosis not present

## 2021-05-29 LAB — CMP (CANCER CENTER ONLY)
ALT: 17 U/L (ref 0–44)
AST: 18 U/L (ref 15–41)
Albumin: 4.2 g/dL (ref 3.5–5.0)
Alkaline Phosphatase: 42 U/L (ref 38–126)
Anion gap: 8 (ref 5–15)
BUN: 15 mg/dL (ref 6–20)
CO2: 26 mmol/L (ref 22–32)
Calcium: 9.3 mg/dL (ref 8.9–10.3)
Chloride: 105 mmol/L (ref 98–111)
Creatinine: 0.78 mg/dL (ref 0.44–1.00)
GFR, Estimated: >60 mL/min (ref >60)
Glucose, Bld: 103 mg/dL — ABNORMAL HIGH (ref 70–99)
Potassium: 3.7 mmol/L (ref 3.5–5.1)
Sodium: 139 mmol/L (ref 135–145)
Total Bilirubin: 1 mg/dL (ref 0.3–1.2)
Total Protein: 6.8 g/dL (ref 6.5–8.1)

## 2021-05-29 LAB — CBC WITH DIFFERENTIAL (CANCER CENTER ONLY)
Abs Immature Granulocytes: 0.01 10*3/uL (ref 0.00–0.07)
Basophils Absolute: 0 10*3/uL (ref 0.0–0.1)
Basophils Relative: 1 %
Eosinophils Absolute: 0.1 10*3/uL (ref 0.0–0.5)
Eosinophils Relative: 3 %
HCT: 34.8 % — ABNORMAL LOW (ref 36.0–46.0)
Hemoglobin: 11.9 g/dL — ABNORMAL LOW (ref 12.0–15.0)
Immature Granulocytes: 0 %
Lymphocytes Relative: 33 %
Lymphs Abs: 1.4 10*3/uL (ref 0.7–4.0)
MCH: 30.4 pg (ref 26.0–34.0)
MCHC: 34.2 g/dL (ref 30.0–36.0)
MCV: 89 fL (ref 80.0–100.0)
Monocytes Absolute: 0.4 10*3/uL (ref 0.1–1.0)
Monocytes Relative: 9 %
Neutro Abs: 2.2 10*3/uL (ref 1.7–7.7)
Neutrophils Relative %: 54 %
Platelet Count: 239 10*3/uL (ref 150–400)
RBC: 3.91 MIL/uL (ref 3.87–5.11)
RDW: 16.4 % — ABNORMAL HIGH (ref 11.5–15.5)
WBC Count: 4.2 10*3/uL (ref 4.0–10.5)
nRBC: 0 % (ref 0.0–0.2)

## 2021-05-30 ENCOUNTER — Inpatient Hospital Stay: Payer: 59

## 2021-05-30 ENCOUNTER — Other Ambulatory Visit: Payer: Self-pay | Admitting: Nurse Practitioner

## 2021-05-30 ENCOUNTER — Other Ambulatory Visit (HOSPITAL_COMMUNITY): Payer: Self-pay

## 2021-05-30 ENCOUNTER — Ambulatory Visit: Payer: 59

## 2021-05-30 VITALS — BP 116/69 | HR 62 | Temp 99.9°F | Resp 18 | Wt 165.8 lb

## 2021-05-30 DIAGNOSIS — C7951 Secondary malignant neoplasm of bone: Secondary | ICD-10-CM

## 2021-05-30 DIAGNOSIS — C9000 Multiple myeloma not having achieved remission: Secondary | ICD-10-CM

## 2021-05-30 DIAGNOSIS — Z95828 Presence of other vascular implants and grafts: Secondary | ICD-10-CM

## 2021-05-30 DIAGNOSIS — Z7189 Other specified counseling: Secondary | ICD-10-CM

## 2021-05-30 DIAGNOSIS — Z510 Encounter for antineoplastic radiation therapy: Secondary | ICD-10-CM | POA: Diagnosis not present

## 2021-05-30 LAB — KAPPA/LAMBDA LIGHT CHAINS
Kappa free light chain: 18.2 mg/L (ref 3.3–19.4)
Kappa, lambda light chain ratio: 0.49 (ref 0.26–1.65)
Lambda free light chains: 37.2 mg/L — ABNORMAL HIGH (ref 5.7–26.3)

## 2021-05-30 MED ORDER — SODIUM CHLORIDE 0.9 % IV SOLN: Freq: Once | INTRAVENOUS | Status: DC

## 2021-05-30 MED ORDER — SODIUM CHLORIDE 0.9 % IV SOLN: Freq: Once | INTRAVENOUS | Status: AC

## 2021-05-30 MED ORDER — ATORVASTATIN CALCIUM 10 MG PO TABS
10.0000 mg | ORAL_TABLET | Freq: Every day | ORAL | 1 refills | Status: DC
  Filled 2021-05-30: qty 30, 30d supply, fill #0
  Filled 2021-06-24: qty 30, 30d supply, fill #1

## 2021-05-30 MED ORDER — SODIUM CHLORIDE 0.9 % IV SOLN
10.0000 mg | Freq: Once | INTRAVENOUS | Status: AC
  Administered 2021-05-30: 10 mg via INTRAVENOUS
  Filled 2021-05-30: qty 10

## 2021-05-30 MED ORDER — ZOLEDRONIC ACID 4 MG/100ML IV SOLN
4.0000 mg | Freq: Once | INTRAVENOUS | Status: AC
  Administered 2021-05-30: 4 mg via INTRAVENOUS
  Filled 2021-05-30: qty 100

## 2021-05-30 MED ORDER — DEXTROSE 5 % IV SOLN
36.0000 mg/m2 | Freq: Once | INTRAVENOUS | Status: AC
  Administered 2021-05-30: 70 mg via INTRAVENOUS
  Filled 2021-05-30: qty 5

## 2021-05-30 MED ORDER — ACETAMINOPHEN 500 MG PO TABS
1000.0000 mg | ORAL_TABLET | Freq: Once | ORAL | Status: AC
  Administered 2021-05-30: 1000 mg via ORAL
  Filled 2021-05-30: qty 2

## 2021-05-30 NOTE — Patient Instructions (Signed)
[facility]  Discharge Instructions: ?Thank you for choosing [facility] to provide your oncology and hematology care.  ? ?If you have a lab appointment with the [facility], please go directly to the [facility] and check in at the registration area. ?  ?Wear comfortable clothing and clothing appropriate for easy access to any Portacath or PICC line.  ? ?We strive to give you quality time with your provider. You may need to reschedule your appointment if you arrive late (15 or more minutes).  Arriving late affects you and other patients whose appointments are after yours.  Also, if you miss three or more appointments without notifying the office, you may be dismissed from the clinic at the provider?s discretion.    ?  ?For prescription refill requests, have your pharmacy contact our office and allow 72 hours for refills to be completed.   ? ?Today you received the following chemotherapy and/or immunotherapy agents: Kyprolis, Zometa.     ?  ?To help prevent nausea and vomiting after your treatment, we encourage you to take your nausea medication as directed. ? ?BELOW ARE SYMPTOMS THAT SHOULD BE REPORTED IMMEDIATELY: ?*FEVER GREATER THAN 100.4 F (38 ?C) OR HIGHER ?*CHILLS OR SWEATING ?*NAUSEA AND VOMITING THAT IS NOT CONTROLLED WITH YOUR NAUSEA MEDICATION ?*UNUSUAL SHORTNESS OF BREATH ?*UNUSUAL BRUISING OR BLEEDING ?*URINARY PROBLEMS (pain or burning when urinating, or frequent urination) ?*BOWEL PROBLEMS (unusual diarrhea, constipation, pain near the anus) ?TENDERNESS IN MOUTH AND THROAT WITH OR WITHOUT PRESENCE OF ULCERS (sore throat, sores in mouth, or a toothache) ?UNUSUAL RASH, SWELLING OR PAIN  ?UNUSUAL VAGINAL DISCHARGE OR ITCHING  ? ?Items with * indicate a potential emergency and should be followed up as soon as possible or go to the Emergency Department if any problems should occur. ? ?Please show the CHEMOTHERAPY ALERT CARD or IMMUNOTHERAPY ALERT CARD at  check-in to the Emergency Department and triage nurse. ? ?Should you have questions after your visit or need to cancel or reschedule your appointment, please contact [facility]  Dept: [phone]  and follow the prompts.  Office hours are 8:00 a.m. to 4:30 p.m. Monday - Friday. Please note that voicemails left after 4:00 p.m. may not be returned until the following business day.  We are closed weekends and major holidays. You have access to a nurse at all times for urgent questions. Please call the main number to the clinic Dept: [phone] and follow the prompts. ? ? ?For any non-urgent questions, you may also contact your provider using MyChart. We now offer e-Visits for anyone 58 and older to request care online for non-urgent symptoms. For details visit [email or web address]. ?  ?Also download the MyChart app! Go to the app store, search "MyChart", open the app, select [facility], and log in with your MyChart username and password. ? ?Due to Covid, a mask is required upon entering the hospital/clinic. If you do not have a mask, one will be given to you upon arrival. For doctor visits, patients may have 1 support person aged 10 or older with them. For treatment visits, patients cannot have anyone with them due to current Covid guidelines and our immunocompromised population.  ? ?

## 2021-05-31 ENCOUNTER — Ambulatory Visit: Payer: 59

## 2021-06-03 ENCOUNTER — Ambulatory Visit
Admission: RE | Admit: 2021-06-03 | Discharge: 2021-06-03 | Disposition: A | Discharge status: Home / Self Care | Payer: 59 | Source: Ambulatory Visit | Attending: Radiation Oncology | Admitting: Radiation Oncology

## 2021-06-03 ENCOUNTER — Other Ambulatory Visit: Payer: Self-pay

## 2021-06-03 ENCOUNTER — Ambulatory Visit: Payer: 59

## 2021-06-03 ENCOUNTER — Telehealth: Payer: Self-pay | Admitting: Hematology

## 2021-06-03 DIAGNOSIS — Z510 Encounter for antineoplastic radiation therapy: Secondary | ICD-10-CM | POA: Diagnosis not present

## 2021-06-03 LAB — RAD ONC ARIA SESSION SUMMARY
Course Elapsed Days: 0
Plan Fractions Treated to Date: 1
Plan Prescribed Dose Per Fraction: 3 Gy
Plan Total Fractions Prescribed: 10
Plan Total Prescribed Dose: 30 Gy
Reference Point Dosage Given to Date: 3 Gy
Reference Point Session Dosage Given: 3 Gy
Session Number: 1

## 2021-06-03 LAB — MULTIPLE MYELOMA PANEL, SERUM
Albumin SerPl Elph-Mcnc: 3.8 g/dL (ref 2.9–4.4)
Albumin/Glob SerPl: 1.8 — ABNORMAL HIGH (ref 0.7–1.7)
Alpha 1: 0.2 g/dL (ref 0.0–0.4)
Alpha2 Glob SerPl Elph-Mcnc: 0.6 g/dL (ref 0.4–1.0)
B-Globulin SerPl Elph-Mcnc: 0.8 g/dL (ref 0.7–1.3)
Gamma Glob SerPl Elph-Mcnc: 0.6 g/dL (ref 0.4–1.8)
Globulin, Total: 2.2 g/dL (ref 2.2–3.9)
IgA: 48 mg/dL — ABNORMAL LOW (ref 87–352)
IgG (Immunoglobin G), Serum: 691 mg/dL (ref 586–1602)
IgM (Immunoglobulin M), Srm: 12 mg/dL — ABNORMAL LOW (ref 26–217)
Total Protein ELP: 6 g/dL (ref 6.0–8.5)

## 2021-06-03 NOTE — Telephone Encounter (Signed)
Scheduled appointment per inbasket message. Patient aware.   ?

## 2021-06-04 ENCOUNTER — Other Ambulatory Visit: Payer: Self-pay

## 2021-06-04 ENCOUNTER — Ambulatory Visit
Admission: RE | Admit: 2021-06-04 | Discharge: 2021-06-04 | Disposition: A | Discharge status: Home / Self Care | Payer: 59 | Source: Ambulatory Visit | Attending: Radiation Oncology | Admitting: Radiation Oncology

## 2021-06-04 ENCOUNTER — Ambulatory Visit: Payer: 59

## 2021-06-04 DIAGNOSIS — Z510 Encounter for antineoplastic radiation therapy: Secondary | ICD-10-CM | POA: Diagnosis not present

## 2021-06-04 LAB — RAD ONC ARIA SESSION SUMMARY
Course Elapsed Days: 1
Plan Fractions Treated to Date: 2
Plan Prescribed Dose Per Fraction: 3 Gy
Plan Total Fractions Prescribed: 10
Plan Total Prescribed Dose: 30 Gy
Reference Point Dosage Given to Date: 6 Gy
Reference Point Session Dosage Given: 3 Gy
Session Number: 2

## 2021-06-05 ENCOUNTER — Other Ambulatory Visit: Payer: Self-pay

## 2021-06-05 ENCOUNTER — Ambulatory Visit: Payer: 59

## 2021-06-05 ENCOUNTER — Ambulatory Visit
Admission: RE | Admit: 2021-06-05 | Discharge: 2021-06-05 | Disposition: A | Discharge status: Home / Self Care | Payer: 59 | Source: Ambulatory Visit | Attending: Radiation Oncology | Admitting: Radiation Oncology

## 2021-06-05 DIAGNOSIS — Z510 Encounter for antineoplastic radiation therapy: Secondary | ICD-10-CM | POA: Diagnosis not present

## 2021-06-05 LAB — RAD ONC ARIA SESSION SUMMARY
Course Elapsed Days: 2
Plan Fractions Treated to Date: 3
Plan Prescribed Dose Per Fraction: 3 Gy
Plan Total Fractions Prescribed: 10
Plan Total Prescribed Dose: 30 Gy
Reference Point Dosage Given to Date: 9 Gy
Reference Point Session Dosage Given: 3 Gy
Session Number: 3

## 2021-06-06 ENCOUNTER — Other Ambulatory Visit: Payer: Self-pay

## 2021-06-06 ENCOUNTER — Ambulatory Visit
Admission: RE | Admit: 2021-06-06 | Discharge: 2021-06-06 | Disposition: A | Discharge status: Home / Self Care | Payer: 59 | Source: Ambulatory Visit | Attending: Radiation Oncology | Admitting: Radiation Oncology

## 2021-06-06 ENCOUNTER — Ambulatory Visit: Payer: 59

## 2021-06-06 DIAGNOSIS — Z510 Encounter for antineoplastic radiation therapy: Secondary | ICD-10-CM | POA: Diagnosis not present

## 2021-06-06 LAB — RAD ONC ARIA SESSION SUMMARY
Course Elapsed Days: 3
Plan Fractions Treated to Date: 4
Plan Prescribed Dose Per Fraction: 3 Gy
Plan Total Fractions Prescribed: 10
Plan Total Prescribed Dose: 30 Gy
Reference Point Dosage Given to Date: 12 Gy
Reference Point Session Dosage Given: 3 Gy
Session Number: 4

## 2021-06-07 ENCOUNTER — Ambulatory Visit: Payer: 59

## 2021-06-07 ENCOUNTER — Ambulatory Visit
Admission: RE | Admit: 2021-06-07 | Discharge: 2021-06-07 | Disposition: A | Discharge status: Home / Self Care | Payer: 59 | Source: Ambulatory Visit | Attending: Radiation Oncology | Admitting: Radiation Oncology

## 2021-06-07 ENCOUNTER — Other Ambulatory Visit: Payer: Self-pay

## 2021-06-07 DIAGNOSIS — C7951 Secondary malignant neoplasm of bone: Secondary | ICD-10-CM

## 2021-06-07 DIAGNOSIS — Z510 Encounter for antineoplastic radiation therapy: Secondary | ICD-10-CM | POA: Diagnosis not present

## 2021-06-07 LAB — RAD ONC ARIA SESSION SUMMARY
Course Elapsed Days: 4
Plan Fractions Treated to Date: 5
Plan Prescribed Dose Per Fraction: 3 Gy
Plan Total Fractions Prescribed: 10
Plan Total Prescribed Dose: 30 Gy
Reference Point Dosage Given to Date: 15 Gy
Reference Point Session Dosage Given: 3 Gy
Session Number: 5

## 2021-06-07 MED ORDER — SONAFINE EX EMUL
1.0000 "application " | Freq: Once | CUTANEOUS | Status: AC
  Administered 2021-06-07: 1 via TOPICAL

## 2021-06-10 ENCOUNTER — Other Ambulatory Visit: Payer: Self-pay

## 2021-06-10 ENCOUNTER — Ambulatory Visit: Payer: 59

## 2021-06-10 ENCOUNTER — Ambulatory Visit
Admission: RE | Admit: 2021-06-10 | Discharge: 2021-06-10 | Disposition: A | Discharge status: Home / Self Care | Payer: 59 | Source: Ambulatory Visit | Attending: Radiation Oncology | Admitting: Radiation Oncology

## 2021-06-10 DIAGNOSIS — Z510 Encounter for antineoplastic radiation therapy: Secondary | ICD-10-CM | POA: Diagnosis not present

## 2021-06-10 LAB — RAD ONC ARIA SESSION SUMMARY
Course Elapsed Days: 7
Plan Fractions Treated to Date: 6
Plan Prescribed Dose Per Fraction: 3 Gy
Plan Total Fractions Prescribed: 10
Plan Total Prescribed Dose: 30 Gy
Reference Point Dosage Given to Date: 18 Gy
Reference Point Session Dosage Given: 3 Gy
Session Number: 6

## 2021-06-11 ENCOUNTER — Other Ambulatory Visit: Payer: Self-pay

## 2021-06-11 ENCOUNTER — Ambulatory Visit
Admission: RE | Admit: 2021-06-11 | Discharge: 2021-06-11 | Disposition: A | Discharge status: Home / Self Care | Payer: 59 | Source: Ambulatory Visit | Attending: Radiation Oncology | Admitting: Radiation Oncology

## 2021-06-11 ENCOUNTER — Other Ambulatory Visit: Payer: Self-pay | Admitting: Hematology

## 2021-06-11 DIAGNOSIS — C9000 Multiple myeloma not having achieved remission: Secondary | ICD-10-CM

## 2021-06-11 DIAGNOSIS — Z510 Encounter for antineoplastic radiation therapy: Secondary | ICD-10-CM | POA: Diagnosis not present

## 2021-06-11 DIAGNOSIS — C7951 Secondary malignant neoplasm of bone: Secondary | ICD-10-CM

## 2021-06-11 LAB — RAD ONC ARIA SESSION SUMMARY
Course Elapsed Days: 8
Plan Fractions Treated to Date: 7
Plan Prescribed Dose Per Fraction: 3 Gy
Plan Total Fractions Prescribed: 10
Plan Total Prescribed Dose: 30 Gy
Reference Point Dosage Given to Date: 21 Gy
Reference Point Session Dosage Given: 3 Gy
Session Number: 7

## 2021-06-12 ENCOUNTER — Other Ambulatory Visit: Payer: Self-pay

## 2021-06-12 ENCOUNTER — Inpatient Hospital Stay: Payer: 59

## 2021-06-12 ENCOUNTER — Ambulatory Visit
Admission: RE | Admit: 2021-06-12 | Discharge: 2021-06-12 | Disposition: A | Discharge status: Home / Self Care | Payer: 59 | Source: Ambulatory Visit | Attending: Radiation Oncology | Admitting: Radiation Oncology

## 2021-06-12 DIAGNOSIS — Z510 Encounter for antineoplastic radiation therapy: Secondary | ICD-10-CM | POA: Diagnosis not present

## 2021-06-12 DIAGNOSIS — C7951 Secondary malignant neoplasm of bone: Secondary | ICD-10-CM

## 2021-06-12 LAB — CBC WITH DIFFERENTIAL (CANCER CENTER ONLY)
Abs Immature Granulocytes: 0.01 10*3/uL (ref 0.00–0.07)
Basophils Absolute: 0 10*3/uL (ref 0.0–0.1)
Basophils Relative: 1 %
Eosinophils Absolute: 0.3 10*3/uL (ref 0.0–0.5)
Eosinophils Relative: 6 %
HCT: 34.1 % — ABNORMAL LOW (ref 36.0–46.0)
Hemoglobin: 11.9 g/dL — ABNORMAL LOW (ref 12.0–15.0)
Immature Granulocytes: 0 %
Lymphocytes Relative: 31 %
Lymphs Abs: 1.3 10*3/uL (ref 0.7–4.0)
MCH: 31.1 pg (ref 26.0–34.0)
MCHC: 34.9 g/dL (ref 30.0–36.0)
MCV: 89 fL (ref 80.0–100.0)
Monocytes Absolute: 0.6 10*3/uL (ref 0.1–1.0)
Monocytes Relative: 15 %
Neutro Abs: 2 10*3/uL (ref 1.7–7.7)
Neutrophils Relative %: 47 %
Platelet Count: 216 10*3/uL (ref 150–400)
RBC: 3.83 MIL/uL — ABNORMAL LOW (ref 3.87–5.11)
RDW: 15.6 % — ABNORMAL HIGH (ref 11.5–15.5)
WBC Count: 4.2 10*3/uL (ref 4.0–10.5)
nRBC: 0 % (ref 0.0–0.2)

## 2021-06-12 LAB — RAD ONC ARIA SESSION SUMMARY
Course Elapsed Days: 9
Plan Fractions Treated to Date: 8
Plan Prescribed Dose Per Fraction: 3 Gy
Plan Total Fractions Prescribed: 10
Plan Total Prescribed Dose: 30 Gy
Reference Point Dosage Given to Date: 24 Gy
Reference Point Session Dosage Given: 3 Gy
Session Number: 8

## 2021-06-12 LAB — CMP (CANCER CENTER ONLY)
ALT: 14 U/L (ref 0–44)
AST: 12 U/L — ABNORMAL LOW (ref 15–41)
Albumin: 4 g/dL (ref 3.5–5.0)
Alkaline Phosphatase: 42 U/L (ref 38–126)
Anion gap: 7 (ref 5–15)
BUN: 14 mg/dL (ref 6–20)
CO2: 30 mmol/L (ref 22–32)
Calcium: 9.1 mg/dL (ref 8.9–10.3)
Chloride: 103 mmol/L (ref 98–111)
Creatinine: 0.8 mg/dL (ref 0.44–1.00)
GFR, Estimated: >60 mL/min (ref >60)
Glucose, Bld: 74 mg/dL (ref 70–99)
Potassium: 3.7 mmol/L (ref 3.5–5.1)
Sodium: 140 mmol/L (ref 135–145)
Total Bilirubin: 1 mg/dL (ref 0.3–1.2)
Total Protein: 6.6 g/dL (ref 6.5–8.1)

## 2021-06-12 MED FILL — Dexamethasone Sodium Phosphate Inj 100 MG/10ML: INTRAMUSCULAR | Qty: 1 | Status: AC

## 2021-06-13 ENCOUNTER — Inpatient Hospital Stay: Payer: 59

## 2021-06-13 ENCOUNTER — Inpatient Hospital Stay (HOSPITAL_BASED_OUTPATIENT_CLINIC_OR_DEPARTMENT_OTHER): Payer: 59 | Admitting: Hematology

## 2021-06-13 ENCOUNTER — Ambulatory Visit
Admission: RE | Admit: 2021-06-13 | Discharge: 2021-06-13 | Disposition: A | Discharge status: Home / Self Care | Payer: 59 | Source: Ambulatory Visit | Attending: Radiation Oncology | Admitting: Radiation Oncology

## 2021-06-13 ENCOUNTER — Other Ambulatory Visit: Payer: Self-pay

## 2021-06-13 VITALS — BP 113/73 | HR 63 | Temp 98.7°F | Resp 16 | Wt 162.5 lb

## 2021-06-13 DIAGNOSIS — Z7189 Other specified counseling: Secondary | ICD-10-CM | POA: Diagnosis not present

## 2021-06-13 DIAGNOSIS — C9000 Multiple myeloma not having achieved remission: Secondary | ICD-10-CM

## 2021-06-13 DIAGNOSIS — C7951 Secondary malignant neoplasm of bone: Secondary | ICD-10-CM

## 2021-06-13 DIAGNOSIS — Z510 Encounter for antineoplastic radiation therapy: Secondary | ICD-10-CM | POA: Diagnosis not present

## 2021-06-13 LAB — RAD ONC ARIA SESSION SUMMARY
Course Elapsed Days: 10
Plan Fractions Treated to Date: 9
Plan Prescribed Dose Per Fraction: 3 Gy
Plan Total Fractions Prescribed: 10
Plan Total Prescribed Dose: 30 Gy
Reference Point Dosage Given to Date: 27 Gy
Reference Point Session Dosage Given: 3 Gy
Session Number: 9

## 2021-06-13 MED ORDER — SODIUM CHLORIDE 0.9 % IV SOLN: Freq: Once | INTRAVENOUS | Status: AC

## 2021-06-13 MED ORDER — DEXTROSE 5 % IV SOLN
36.0000 mg/m2 | Freq: Once | INTRAVENOUS | Status: AC
  Administered 2021-06-13: 70 mg via INTRAVENOUS
  Filled 2021-06-13: qty 5

## 2021-06-13 MED ORDER — SODIUM CHLORIDE 0.9 % IV SOLN
10.0000 mg | Freq: Once | INTRAVENOUS | Status: AC
  Administered 2021-06-13: 10 mg via INTRAVENOUS
  Filled 2021-06-13: qty 10

## 2021-06-13 MED ORDER — ACETAMINOPHEN 500 MG PO TABS
1000.0000 mg | ORAL_TABLET | Freq: Once | ORAL | Status: AC
  Administered 2021-06-13: 1000 mg via ORAL
  Filled 2021-06-13: qty 2

## 2021-06-13 NOTE — Patient Instructions (Signed)
[facility]  Discharge Instructions: Thank you for choosing [facility] to provide your oncology and hematology care.   If you have a lab appointment with the Cancer Center, please go directly to the Cancer Center and check in at the registration area.   Wear comfortable clothing and clothing appropriate for easy access to any Portacath or PICC line.   We strive to give you quality time with your provider. You may need to reschedule your appointment if you arrive late (15 or more minutes).  Arriving late affects you and other patients whose appointments are after yours.  Also, if you miss three or more appointments without notifying the office, you may be dismissed from the clinic at the provider's discretion.      For prescription refill requests, have your pharmacy contact our office and allow 72 hours for refills to be completed.    Today you received the following chemotherapy and/or immunotherapy agents: Kyprolis    To help prevent nausea and vomiting after your treatment, we encourage you to take your nausea medication as directed.  BELOW ARE SYMPTOMS THAT SHOULD BE REPORTED IMMEDIATELY: . *FEVER GREATER THAN 100.4 F (38 C) OR HIGHER . *CHILLS OR SWEATING . *NAUSEA AND VOMITING THAT IS NOT CONTROLLED WITH YOUR NAUSEA MEDICATION . *UNUSUAL SHORTNESS OF BREATH . *UNUSUAL BRUISING OR BLEEDING . *URINARY PROBLEMS (pain or burning when urinating, or frequent urination) . *BOWEL PROBLEMS (unusual diarrhea, constipation, pain near the anus) . TENDERNESS IN MOUTH AND THROAT WITH OR WITHOUT PRESENCE OF ULCERS (sore throat, sores in mouth, or a toothache) . UNUSUAL RASH, SWELLING OR PAIN  . UNUSUAL VAGINAL DISCHARGE OR ITCHING   Items with * indicate a potential emergency and should be followed up as soon as possible or go to the Emergency Department if any problems should occur.  Please show the CHEMOTHERAPY ALERT CARD or IMMUNOTHERAPY ALERT  CARD at check-in to the Emergency Department and triage nurse.  Should you have questions after your visit or need to cancel or reschedule your appointment, please contact [facility]  Dept: [phone]  and follow the prompts.  Office hours are 8:00 a.m. to 4:30 p.m. Monday - Friday. Please note that voicemails left after 4:00 p.m. may not be returned until the following business day.  We are closed weekends and major holidays. You have access to a nurse at all times for urgent questions. Please call the main number to the clinic Dept: [phone] and follow the prompts.   For any non-urgent questions, you may also contact your provider using MyChart. We now offer e-Visits for anyone 18 and older to request care online for non-urgent symptoms. For details visit [email or web address].   Also download the MyChart app! Go to the app store, search "MyChart", open the app, select , and log in with your MyChart username and password.  Due to Covid, a mask is required upon entering the hospital/clinic. If you do not have a mask, one will be given to you upon arrival. For doctor visits, patients may have 1 support person aged 18 or older with them. For treatment visits, patients cannot have anyone with them due to current Covid guidelines and our immunocompromised population.   

## 2021-06-14 ENCOUNTER — Other Ambulatory Visit (HOSPITAL_COMMUNITY): Payer: Self-pay

## 2021-06-14 ENCOUNTER — Other Ambulatory Visit: Payer: Self-pay

## 2021-06-14 ENCOUNTER — Telehealth: Payer: Self-pay | Admitting: Pharmacist

## 2021-06-14 ENCOUNTER — Ambulatory Visit
Admission: RE | Admit: 2021-06-14 | Discharge: 2021-06-14 | Disposition: A | Discharge status: Home / Self Care | Payer: 59 | Source: Ambulatory Visit | Attending: Radiation Oncology | Admitting: Radiation Oncology

## 2021-06-14 ENCOUNTER — Encounter: Payer: Self-pay | Admitting: Urology

## 2021-06-14 ENCOUNTER — Encounter: Payer: Self-pay | Admitting: Hematology

## 2021-06-14 DIAGNOSIS — C9002 Multiple myeloma in relapse: Secondary | ICD-10-CM

## 2021-06-14 DIAGNOSIS — Z510 Encounter for antineoplastic radiation therapy: Secondary | ICD-10-CM | POA: Diagnosis not present

## 2021-06-14 LAB — RAD ONC ARIA SESSION SUMMARY
Course Elapsed Days: 11
Plan Fractions Treated to Date: 10
Plan Prescribed Dose Per Fraction: 3 Gy
Plan Total Fractions Prescribed: 10
Plan Total Prescribed Dose: 30 Gy
Reference Point Dosage Given to Date: 30 Gy
Reference Point Session Dosage Given: 3 Gy
Session Number: 10

## 2021-06-14 MED ORDER — PROCHLORPERAZINE MALEATE 10 MG PO TABS
10.0000 mg | ORAL_TABLET | Freq: Four times a day (QID) | ORAL | 1 refills | Status: DC | PRN
  Filled 2021-06-14: qty 30, 8d supply, fill #0

## 2021-06-14 MED ORDER — LIDOCAINE-PRILOCAINE 2.5-2.5 % EX CREA
TOPICAL_CREAM | CUTANEOUS | 3 refills | Status: DC
  Filled 2021-06-14: qty 30, 1d supply, fill #0

## 2021-06-14 MED ORDER — DEXAMETHASONE 4 MG PO TABS
ORAL_TABLET | ORAL | 0 refills | Status: DC
  Filled 2021-06-14: qty 30, 84d supply, fill #0

## 2021-06-14 MED ORDER — POMALIDOMIDE 3 MG PO CAPS
3.0000 mg | ORAL_CAPSULE | Freq: Every day | ORAL | 1 refills | Status: DC
  Filled 2021-06-14: qty 21, 21d supply, fill #0

## 2021-06-14 MED ORDER — LORAZEPAM 0.5 MG PO TABS
0.5000 mg | ORAL_TABLET | Freq: Four times a day (QID) | ORAL | 0 refills | Status: DC | PRN
  Filled 2021-06-14: qty 30, 8d supply, fill #0

## 2021-06-14 MED ORDER — ONDANSETRON HCL 8 MG PO TABS
8.0000 mg | ORAL_TABLET | Freq: Two times a day (BID) | ORAL | 1 refills | Status: DC | PRN
  Filled 2021-06-14: qty 30, 15d supply, fill #0

## 2021-06-14 NOTE — Telephone Encounter (Signed)
Oral Oncology Pharmacist Encounter  Received new prescription for Pomalyst (pomalidomide) for the treatment of multiple myeloma in conjunction with istuximab and dexamethasone, planned duration until disease progression or unacceptable drug toxicity.  CBC w/ Diff and CMP from 06/12/21 assessed, labs stable for treatment initiation. Prescription dose and frequency assessed for appropriateness. Per Dr. Irene Limbo patient will be started on dose reduced pomalidomide 3 mg, and increased to 4 mg if tolerated for subsequent cycles.  Current medication list in Epic reviewed, DDIs with Pomalyst identified: Category C drug-drug interaction between Pomalyst and levocetirizine, lorazepam, prochlorperazine, and trazodone due to risk of CNS depression. Noted lorazepam, prochlorperazine and trazodone prescribed PRN. No changes in therapy warranted at this time, recommend monitoring patient for increase fatigue/somnolence.  Evaluated chart and no patient barriers to medication adherence noted.    Received notification from [facility] that prior authorization for Pomalyst is required.   PA submitted on CoverMyMeds Key: [id number] Status is pending   Oral Oncology Clinic will continue to follow for insurance authorization, copayment issues, initial counseling and start date.  Leron Croak, PharmD, BCPS Hematology/Oncology Clinical Pharmacist [facility] [phone] 06/14/2021 4:42 PM

## 2021-06-14 NOTE — Progress Notes (Signed)
DISCONTINUE ON PATHWAY REGIMEN - Multiple Myeloma and Other Plasma Cell Dyscrasias     A cycle is every 28 days:     Carfilzomib      Carfilzomib      Carfilzomib      Dexamethasone      Dexamethasone      Lenalidomide   **Always confirm dose/schedule in your pharmacy ordering system**  REASON: Disease Progression PRIOR TREATMENT: [id number]: KRd (Carfilzomib 20/36 mg/m2 + Lenalidomide 25 mg + Dexamethasone PO 40/20 mg) q28 Days x 4-8 Cycles TREATMENT RESPONSE: Complete Response (CR)  START OFF PATHWAY REGIMEN - Multiple Myeloma and Other Plasma Cell Dyscrasias   [id number]:Isatuximab 10 mg/kg IV + Pomalidomide 4 mg PO + Dexamethasone 40 mg PO/IV q28 Days:   Cycle 1: A cycle is 28 days:     Pomalidomide      Dexamethasone      Isatuximab-irfc    Cycles 2 and beyond: A cycle is every 28 days:     Pomalidomide      Dexamethasone      Isatuximab-irfc   **Always confirm dose/schedule in your pharmacy ordering system**  Patient Characteristics: Multiple Myeloma, Newly Diagnosed, Transplant Eligible, High Risk Disease Classification: Multiple Myeloma R-ISS Staging: I Therapeutic Status: Newly Diagnosed Is Patient Eligible for Transplant<= Transplant Eligible Risk Status: High Risk Intent of Therapy: Non-Curative / Palliative Intent, Discussed with Patient

## 2021-06-15 ENCOUNTER — Other Ambulatory Visit (HOSPITAL_COMMUNITY): Payer: Self-pay

## 2021-06-18 ENCOUNTER — Other Ambulatory Visit (HOSPITAL_COMMUNITY): Payer: Self-pay

## 2021-06-18 ENCOUNTER — Other Ambulatory Visit: Payer: Self-pay

## 2021-06-18 DIAGNOSIS — C9000 Multiple myeloma not having achieved remission: Secondary | ICD-10-CM

## 2021-06-18 DIAGNOSIS — C7951 Secondary malignant neoplasm of bone: Secondary | ICD-10-CM

## 2021-06-18 DIAGNOSIS — Z7189 Other specified counseling: Secondary | ICD-10-CM

## 2021-06-18 MED ORDER — POMALIDOMIDE 3 MG PO CAPS: 3.0000 mg | ORAL_CAPSULE | Freq: Every day | ORAL | 1 refills | Status: DC

## 2021-06-18 NOTE — Telephone Encounter (Signed)
Oral Oncology Pharmacist Encounter  Prior Authorization for Pomalyst has been approved.    PA# [id number]  Effective dates: 06/14/21 through 06/14/22  Patient is required to fill through [facility].   Leron Croak, PharmD, BCPS Hematology/Oncology Clinical Pharmacist [facility] [phone] 06/18/2021 7:29 AM

## 2021-06-19 NOTE — Progress Notes (Addendum)
HEMATOLOGY/ONCOLOGY CLNIC NOTE  Date of Service: .06/13/2021  Patient Care Team: [name], FNP as PCP - General (General Practice)  CHIEF COMPLAINTS/PURPOSE OF CONSULTATION:  Follow-up for continued valuation and management of multiple myeloma  HISTORY OF PRESENTING ILLNESS:   Please see previous note for details   INTERVAL HISTORY:  Alexandra [name] is a 56 y.o. female who is here for continued evaluation and management of her multiple myeloma. Since her last clinic visit the patient noted a left temporal nodule near the site of her previous craniotomy surgery. Subsequent PET CT scan on 05/24/2021 showed hypermetabolic soft tissue nodule along the anterior aspect of the left temporalis muscle suspicious for plasmacytoma recurrence.  Other abdominal focus of increased uptake within the skeleton to suggest metabolically active osseous multiple myeloma involvement.  Patient notes that other than this noted on the left temporal area she has had no other acute new focal symptoms. She had radiation to this area and it has already shrunk and nearly resolved.  She did have a follow-up with Dr. Alvie Heidelberg at [facility] and was recommended to switch to Isatuximab and pomalidomide for consolidative treatment/maintenance in the context of early recurrence posttransplant.  I discussed this treatment option in detail with the patient and she is agreeable to proceed with this. She will have her last dose of carfilzomib today and then will switch to her new treatment.  Labs done today and the recent labs were discussed with her in details.   ONCOLOGIC HISTORY   Lambda Light Chain Multiple Myeloma  A. 09/27/19: CT Maxillofacial enhancing dural-based anterior cranial fossa mass traversing the calvarium and extending into the extracalvarial soft tissues along with a 7.4 mm rightward midline shift at the level of anterior cranial fossa. MRI head with and without contrast is recommended for  further evaluation. B. 10/05/19: MRI Head large extra-axial mass anterior left frontal region with bone erosion and extension into the scalp soft tissues. Considerations include hemangiopericytoma, metastasis, and aggressive hemangioma. 2.6 mm enhancing lesion in the clivus.  C. 10/10/19: Left craniectomy with frontal brain tumor/bone resection, path + plasma cell neoplasm, lambda restricted, plasmacytoma favored. Hgb 9.9,  D. 10/31/19: Initial Hem-Onc consultation; M-spike 0; IgG 627, IgQ 51, IgM 11; SFLC: Kappa 1.12 mg/dL, Lambda 154.50, ratio 0.01; IFE: serum + monoclonal free Lambda light chain; Hgb 10.1,  E. 11/04/19: 24 hour UPEP Total protein 1318, Free lambda light chains Ur 1332.46, ratio 0.02, M-spike 27.4, M-spike 24 hr at 361; Bence Jones positive, Lambda type F. 11/08/19: BMBx 22% atypical plasma cells, 30% by IHC; FISH Dup(1q) and Del(13q) detected G. 11/10/19: PET CT no metabolic activity within the skeleton to localize active myeloma; no lytic lesions identified; no abnormal activity at the left craniectomy site; no evidence of soft tissue plasmacytoma. H. 12/05/19: Started KRD; SFLC: Kappa 1.42, Lambda 177.97, ratio 0.01; Hgb 11.0 I. 12//13/21: SFLC: Kappa 1.45, Lambda 8.92, ratio 0.16; Hgb 11.1 J. 02/06/20: SFLC: Kappa 1.10, Lambda 2.08, ratio 0.53; Hgb 11.3 K. 02/26/20: Cycle 4 of KRD; SFLC: Kappa 1.14, Lambda 1.90, ratio 0.60; Hgb 10.8 L. 04/02/20: SFLC: Kappa 1.05, Lambda 1.56, ratio 0.67; Hgb 11.2 M. 04/30/20: SFLC: Kappa 1.01, Lambda 1.40, ratio 0.72; Hgb 11.5  N. 05/05/20: MRI Brain no evidence of left frontal bone lesion recurrence; Multiple subcentimeter area of nodular enhancement in the calvarium are nonspecific but could related to myeloma given nonvisualization on prior. O. 05/11/20: PET CT no findings to suggest FDG avid lesions of multiple myeloma. P. 05/14/20: BMBx 1%  plasma cells Q. 05/28/20: SFLC: Kappa 0.84, Lambda 1.16, ratio 0.72; Hgb 11.6  R. 06/11/20: EKG QT 408/QTc 424;  PFT's: FEV1 110%, DLCO 58.8% predicted, 64.4% hgb corrected; ECHO: EF 55% est, 61% calc; GLS -20%; SFLC: Kappa 1.16, Lambda 1.40, ratio 0.83; Hgb 10.9-VGPR S. 07/06/20: SFLC: Kappa 1.16, Lambda 2.14, ratio 0.54; . Hgb 11.7, Plt 118 T. 6/20-7/12/22: Hospital admission for Melphalan 200 mg/m2 followed by reinfusion of 4.71 x 10^6/kg CD34+ autologous cells MEDICAL HISTORY:  Past Medical History:  Diagnosis Date   Anxiety    Hypertension    Pre-diabetes     SURGICAL HISTORY: Past Surgical History:  Procedure Laterality Date   ABLATION     APPLICATION OF CRANIAL NAVIGATION N/A 10/10/2019   Procedure: APPLICATION OF CRANIAL NAVIGATION;  Surgeon: Consuella Lose, MD;  Location: [place];  Service: Neurosurgery;  Laterality: N/A;  APPLICATION OF CRANIAL NAVIGATION   CRANIOPLASTY Left 10/14/2019   Procedure: LEFT CRANIOPLASTY WITH PLACEMENT OF CUSTOM CRANIAL IMPLANT;  Surgeon: Consuella Lose, MD;  Location: [place];  Service: Neurosurgery;  Laterality: Left;   CRANIOTOMY Left 10/10/2019   Procedure: Left frontal Stereotactic craniectomy for resection of tumor;  Surgeon: Consuella Lose, MD;  Location: [place];  Service: Neurosurgery;  Laterality: Left;  Left frontal Stereotactic craniectomy for resection of tumor   HAMMER TOE SURGERY Left    IR IMAGING GUIDED PORT INSERTION  12/01/2019   TUBAL LIGATION      SOCIAL HISTORY: Social History   Socioeconomic History   Marital status: Single    Spouse name: Not on file   Number of children: Not on file   Years of education: Not on file   Highest education level: 12th grade  Occupational History   Not on file  Tobacco Use   Smoking status: Never   Smokeless tobacco: Never  Vaping Use   Vaping Use: Never used  Substance and Sexual Activity   Alcohol use: Yes    Comment: occassionally   Drug use: Not Currently   Sexual activity: Not on file  Other Topics Concern   Not on file  Social History Narrative   Not on file   Social  Determinants of Health   Financial Resource Strain: Medium Risk   Difficulty of Paying Living Expenses: Somewhat hard  Food Insecurity: Food Insecurity Present   Worried About Charity fundraiser in the Last Year: Sometimes true   Arboriculturist in the Last Year: Never true  Transportation Needs: Unmet Transportation Needs   Lack of Transportation (Medical): No   Lack of Transportation (Non-Medical): Yes  Physical Activity: Sufficiently Active   Days of Exercise per Week: 5 days   Minutes of Exercise per Session: 30 min  Stress: No Stress Concern Present   Feeling of Stress : Only a little  Social Connections: Moderately Integrated   Frequency of Communication with Friends and Family: More than three times a week   Frequency of Social Gatherings with Friends and Family: Once a week   Attends Religious Services: More than 4 times per year   Active Member of [employer] or Organizations: Yes   Attends Archivist Meetings: 1 to 4 times per year   Marital Status: Divorced  Human resources officer Violence: Not on file    FAMILY HISTORY: Family History  Problem Relation Age of Onset   Breast cancer Maternal Grandmother    Heart disease Mother    Hypertension Mother    Sarcoidosis Father    Diabetes Maternal Merchant navy officer  ALLERGIES:  has No Known Allergies.  MEDICATIONS:  Current Outpatient Medications  Medication Sig Dispense Refill   aspirin EC 81 MG tablet Take 81 mg by mouth. Swallow whole.     atorvastatin (LIPITOR) 10 MG tablet Take 1 tablet (10 mg total) by mouth daily. 30 tablet 1   benazepril (LOTENSIN) 10 MG tablet Take 1 tablet  by mouth daily. 90 tablet 1   dexamethasone (DECADRON) 4 MG tablet Take 5 tablets (97m) every 2 weeks on days 8 and 22 starting cycle 2. Repeat every 28 days. 60 tablet 0   docusate sodium (COLACE) 100 MG capsule Take 100 mg by mouth 2 (two) times daily.     hydrochlorothiazide (HYDRODIURIL) 12.5 MG tablet Take 1 tablet by mouth daily as  needed for leg swelling 90 tablet 1   levocetirizine (XYZAL) 5 MG tablet TAKE 1 TABLET BY MOUTH EVERY DAY IN THE EVENING 90 tablet 3   lidocaine-prilocaine (EMLA) cream Apply to affected area once as directed 30 g 3   LORazepam (ATIVAN) 0.5 MG tablet Take 1 tablet (0.5 mg total) by mouth every 6 (six) hours as needed (Nausea or vomiting). 30 tablet 0   ondansetron (ZOFRAN) 8 MG tablet Take 1 tablet (8 mg total) by mouth 2 (two) times daily as needed (Nausea or vomiting). 30 tablet 1   OZEMPIC, 1 MG/DOSE, 4 MG/3ML SOPN INJECT ONCE WEEKLY, SAME DAY. (Patient taking differently: 1 mg. INJECT ONCE WEEKLY, SAME DAY.) 9 mL 1   pomalidomide (POMALYST) 3 MG capsule Take 1 capsule (3 mg total) by mouth daily. Take 21 days on, 7 days off, repeat every 28 days. 21 capsule 1   prochlorperazine (COMPAZINE) 10 MG tablet Take 1 tablet (10 mg total) by mouth every 6 (six) hours as needed (Nausea or vomiting). 30 tablet 1   traZODone (DESYREL) 50 MG tablet TAKE 1 TO 2 TABLETS BY MOUTH AT BEDTIME AS NEEDED FOR SLEEP 180 tablet 1   valACYclovir (VALTREX) 500 MG tablet Take 1 tablet by mouth 2 times a day 60 tablet 10   No current facility-administered medications for this visit.    REVIEW OF SYSTEMS:   10 Point review of Systems was done is negative except as noted above.  PHYSICAL EXAMINATION: ECOG PERFORMANCE STATUS: 1 - Symptomatic but completely ambulatory . GENERAL:alert, in no acute distress and comfortable SKIN: no acute rashes, no significant lesions EYES: conjunctiva are pink and non-injected, sclera anicteric OROPHARYNX: MMM, no exudates, no oropharyngeal erythema or ulceration NECK: supple, no JVD LYMPH:  no palpable lymphadenopathy in the cervical, axillary or inguinal regions LUNGS: clear to auscultation b/l with normal respiratory effort HEART: regular rate & rhythm ABDOMEN:  normoactive bowel sounds , non tender, not distended. Extremity: no pedal edema PSYCH: alert & oriented x 3 with  fluent speech NEURO: no focal motor/sensory deficits    Physical exam performed in infusion chair.  LABORATORY DATA:  I have reviewed the data as listed     Latest Ref Rng & Units 06/12/2021    3:25 PM 05/29/2021    4:04 PM 05/15/2021    3:58 PM  CBC  WBC 4.0 - 10.5 K/uL 4.2   4.2   4.2    Hemoglobin 12.0 - 15.0 g/dL 11.9   11.9   11.6    Hematocrit 36.0 - 46.0 % 34.1   34.8   34.5    Platelets 150 - 400 K/uL 216   239   220  Latest Ref Rng & Units 06/12/2021    3:25 PM 05/29/2021    4:04 PM 05/15/2021    3:58 PM  CMP  Glucose 70 - 99 mg/dL 74   103   71    BUN 6 - 20 mg/dL 14   15   14     Creatinine 0.44 - 1.00 mg/dL 0.80   0.78   0.77    Sodium 135 - 145 mmol/L 140   139   140    Potassium 3.5 - 5.1 mmol/L 3.7   3.7   3.8    Chloride 98 - 111 mmol/L 103   105   105    CO2 22 - 32 mmol/L 30   26   30     Calcium 8.9 - 10.3 mg/dL 9.1   9.3   8.8    Total Protein 6.5 - 8.1 g/dL 6.6   6.8   6.5    Total Bilirubin 0.3 - 1.2 mg/dL 1.0   1.0   0.7    Alkaline Phos 38 - 126 U/L 42   42   38    AST 15 - 41 U/L 12   18   13     ALT 0 - 44 U/L 14   17   13      11/08/2019 FISH Plasma Cell Myeloma Prognostic Panel:    11/08/2019 Cytogenetics:    11/08/2019 Bone Marrow Report [id number]):   10/10/2019 Surgical Pathology [phone]):   05/14/2020 Surgical Pathology "-Normocellular bone marrow for age with trilineage hematopoiesis and 1%  plasma cells  -See comment   PERIPHERAL BLOOD:  -Normocytic-normochromic anemia  -Leukopenia   COMMENT:   The bone marrow is generally normocellular for age with trilineage  hematopoiesis but with predominance of erythroid precursors.  The  myeloid changes are not considered specific and likely related to  therapy.  In this background, the plasma cells represent 1% of all cells  associated with scattered small clusters and display slight lambda light  chain excess.  The findings are limited but most suggestive of  minimal  residual plasma cell neoplasm.  Correlation with cytogenetic and FISH  studies is recommended. "  RADIOGRAPHIC STUDIES:  I have personally reviewed the radiological images as listed and agreed with the findings in the report. NM PET Image Restage (PS) Whole Body  Result Date: 05/27/2021 CLINICAL DATA:  Subsequent treatment strategy for multiple myeloma with plasmacytoma. EXAM: NUCLEAR MEDICINE PET WHOLE BODY TECHNIQUE: 8.1 mCi F-18 FDG was injected intravenously. Full-ring PET imaging was performed from the head to foot after the radiotracer. CT data was obtained and used for attenuation correction and anatomic localization. Fasting blood glucose: 92 mg/dl COMPARISON:  Multiple priors including MRI head May 03, 2021 and PET-CT January 16, 2021 FINDINGS: Mediastinal blood pool activity: SUV max 2.75 HEAD/NECK: Hypermetabolic soft tissue nodule along the anterior aspect of the left temporalis muscle which measures approximately 18 x 11 mm on image 41/8 with a max SUV of 3.46. No hypermetabolic cervical adenopathy. Incidental CT findings: Surgical changes of left frontal craniectomy with flap reconstruction. No pathologically enlarged cervical lymph nodes. Streak artifact from dental hardware. CHEST: No hypermetabolic thoracic adenopathy. No hypermetabolic pulmonary nodules or masses. Incidental CT findings: none ABDOMEN/PELVIS: No abnormal hypermetabolic activity within the liver, pancreas, adrenal glands, or spleen. No hypermetabolic lymph nodes in the abdomen or pelvis. Incidental CT findings: Bilateral tubal ligation clips in the pelvis. Aortic atherosclerosis. SKELETON: No focal hypermetabolic activity to suggest skeletal metastasis. Incidental CT findings:  none EXTREMITIES: No abnormal hypermetabolic activity in the lower extremities. Incidental CT findings: none IMPRESSION: 1. Hypermetabolic soft tissue nodule along the anterior aspect of the left temporalis muscle, suspicious for plasmacytoma  recurrence. 2. No abnormal foci of increased uptake within the skeleton to suggest metabolically active osseous multiple myeloma involvement. 3. No suspicious bone lesions identified on the CT portion of the exam. Electronically Signed   By: Dahlia Bailiff M.D.   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 No M spike. Lambda light chain myeloma + Anemia + bone lesion on skull-- resected No renal failure, hypercalcemia  BMBx- 22-30% lambda restricted plasma cell MolCy Dup (1q), del (13q)  Completed 7 cycles of KRD  The pt recently had a transplant at [facility]. DISEASE Lambda Light Chain Multiple Myeloma  - She will receive Melphalan 200 mg/m2 conditioning regimen, followed by autologous stem cell rescue (see details below).  TRANSPLANT: Preparative regimen:  - Melphalan 200 mg/m2 (339m) IV: 07/09/20  Stem Cells:  - Autologous stem cell reinfusion on 07/10/20. Patient received 4.71 x 10^6 CD34+/kg. - Her course was complicated by neutropenic fevers and Bcx 6/27 positive for E coli. She received IV cefepime from 6/27 until 6/30, which cefepime was switched to zosyn in the setting of new abdominal pain. CT abdomen/pelvis was performed that showed acute uncomplicated multifocal diverticulitis involving the transverse and descending colon. She was treated with bowel rest, prn pain medication, and IV zosyn, which was continued until engraftment on 7/5 and was transitioned to ppx ceftriaxone. Unfortunately, with the transition to ceftriaxone she had recurrent fevers and as a result was restarted on zosyn. Zosyn was transitioned to oral cipro/flagyl on 7/10 with the plan to continue for a 7 day course.    Bone  marrow biopsy done on 01/18/2021 showed normocellular bone marrow with no morphologic evidence of plasma cell neoplasm. 24-hour UPEP on 01/22/2021 showed unremarkable immunofixation pattern and no monoclonal protein. PET CT scan 01/16/2021-negative for FDG avid lesions of myeloma MRI brain 01/12/2021-showed no overt active myeloma lesions.  3) prediabetes 4) hypertension 5) anxiety 6) dyslipidemia PLAN: -Labs and drape were discussed in detail with the patient.  Slight elevation of lambda free light chains to 37.2. -Recent relapse over the left temporal area as a subcutaneous lesion status post radiation with resolution. -Recent PET CT scan done was discussed with her in detail and only showed the left temporal soft tissue hypermetabolic nodule but no other overt evidence of osseous recurrence. -Patient has had no regular toxicities from her carfilzomib and Revlimid at this time. -[facility] given her limited progression. -Based on recommendations from [facility] we will switch her treatment to Isatuximab plus pomalidomide. -I discussed this new treatment option in detail with the patient and obtained her approval. -post transplant vaccination being ordered -zometa every 3 months -Continue daily baby ASA, Valtrex, & B complex.  .The total time spent in the appointment was 40 minutes* .  All of the patient's questions were answered with apparent satisfaction. The patient knows to call the clinic with any problems, questions or concerns.   GSullivan LoneMD MWarrentonAAHIVMS SFirst Texas HospitalCTampa Bay Surgery Center D[facility] Center For Advanced Surgical SpecialistsHematology/Oncology Physician  [place]  .*Total Encounter Time as defined by the Centers for Medicare and Medicaid Services includes, in addition to the face-to-face time of a patient visit (documented in the note above) non-face-to-face time: obtaining and reviewing outside history, ordering and reviewing medications, tests  or procedures, care coordination (communications with other health care professionals or caregivers) and documentation in the medical record.

## 2021-06-19 NOTE — Addendum Note (Signed)
Addended by: Sullivan Lone on: 06/19/2021 04:34 PM   Modules accepted: Level of Service

## 2021-06-20 ENCOUNTER — Telehealth: Payer: Self-pay

## 2021-06-20 ENCOUNTER — Other Ambulatory Visit: Payer: Self-pay | Admitting: *Deleted

## 2021-06-20 DIAGNOSIS — C9000 Multiple myeloma not having achieved remission: Secondary | ICD-10-CM

## 2021-06-20 MED FILL — Dexamethasone Sodium Phosphate Inj 100 MG/10ML: INTRAMUSCULAR | Qty: 2 | Status: AC

## 2021-06-20 NOTE — Telephone Encounter (Signed)
Returned patient's call in regards to changes in upcoming appointments. Patient informed that the prior authorization for Alexandra [name] was still pending. Patient's appointments were rescheduled to allow time for authorization to be processed prior to receiving drug.  Patient verbalized an understanding and confirmed new appointments with her.

## 2021-06-21 ENCOUNTER — Other Ambulatory Visit (HOSPITAL_COMMUNITY): Payer: Self-pay

## 2021-06-21 ENCOUNTER — Telehealth: Payer: Self-pay

## 2021-06-21 ENCOUNTER — Inpatient Hospital Stay: Payer: 59

## 2021-06-21 NOTE — Progress Notes (Signed)
Pharmacist Chemotherapy Monitoring - Initial Assessment    Anticipated start date: 06/28/21   The following has been reviewed per standard work regarding the patient's treatment regimen: The patient's diagnosis, treatment plan and drug doses, and organ/hematologic function Lab orders and baseline tests specific to treatment regimen  The treatment plan start date, drug sequencing, and pre-medications Prior authorization status  Patient's documented medication list, including drug-drug interaction screen and prescriptions for anti-emetics and supportive care specific to the treatment regimen The drug concentrations, fluid compatibility, administration routes, and timing of the medications to be used The patient's access for treatment and lifetime cumulative dose history, if applicable  The patient's medication allergies and previous infusion related reactions, if applicable   Changes made to treatment plan:  N/A  Follow up needed:  Pending authorization for treatment  and f/u RBC phenotype    Philomena Course, [place], 06/21/2021  12:22 PM

## 2021-06-21 NOTE — Telephone Encounter (Signed)
Oral Chemotherapy Pharmacist Encounter  I spoke with patient for overview of: Pomalyst (pomalidomide) for the treatment of relapsed multiple myeloma in conjunction with daratumumab and dexamethasone, planned duration until disease progression or unacceptable toxicity.   Counseled patient on administration, dosing, side effects, monitoring, drug-food interactions, safe handling, storage, and disposal.  Patient will take Pomalyst 40m capsules, 1 capsule by mouth once daily, without regard to food, with a full glass of water.  Pomalyst will be given 21 days on, 7 days off, repeat every 28 days.  Patient will receive dexamethasone when patient is receiving isatuximab infusion.   Pomalyst start date: 06/26/2021 Isatuximab start date: 06/28/2021  Adverse effects of Pomalyst include but are not limited to: nausea, constipation, diarrhea, abdominal pain, rash, fatigue, drug fever, peripheral edema, and decreased blood counts.    Reviewed with patient importance of keeping a medication schedule and plan for any missed doses. No barriers to medication adherence identified.  Medication reconciliation performed and medication/allergy list updated.   Patient has prescription for valacyclovir.  Patient counseled on importance of daily aspirin 854mfor VTE prophylaxis.  Insurance authorization for PoIll[employer] Tool Worksas been obtained.  Pomalyst prescription is being dispensed from [facility] as it is a limited distribution medication.  All questions answered.  Mrs. Pontillo voiced understanding and appreciation.   Medication education handout placed in mail for patient. Patient knows to call the office with questions or concerns. Oral Chemotherapy Clinic phone number provided to patient.   KaDrema HalonPharmD Hematology/Oncology Clinical Pharmacist [phone]/02/2021   3:17 PM

## 2021-06-24 ENCOUNTER — Other Ambulatory Visit (HOSPITAL_COMMUNITY): Payer: Self-pay

## 2021-06-28 ENCOUNTER — Other Ambulatory Visit: Payer: Self-pay

## 2021-06-28 ENCOUNTER — Inpatient Hospital Stay: Payer: 59 | Attending: Hematology

## 2021-06-28 ENCOUNTER — Inpatient Hospital Stay: Payer: 59

## 2021-06-28 VITALS — BP 110/70 | HR 68 | Temp 98.5°F | Resp 16 | Wt 165.4 lb

## 2021-06-28 DIAGNOSIS — C7951 Secondary malignant neoplasm of bone: Secondary | ICD-10-CM

## 2021-06-28 DIAGNOSIS — C9000 Multiple myeloma not having achieved remission: Secondary | ICD-10-CM

## 2021-06-28 DIAGNOSIS — C9001 Multiple myeloma in remission: Secondary | ICD-10-CM | POA: Diagnosis present

## 2021-06-28 DIAGNOSIS — Z7189 Other specified counseling: Secondary | ICD-10-CM

## 2021-06-28 DIAGNOSIS — Z5112 Encounter for antineoplastic immunotherapy: Secondary | ICD-10-CM | POA: Diagnosis present

## 2021-06-28 LAB — CBC WITH DIFFERENTIAL (CANCER CENTER ONLY)
Abs Immature Granulocytes: 0.01 10*3/uL (ref 0.00–0.07)
Basophils Absolute: 0 10*3/uL (ref 0.0–0.1)
Basophils Relative: 1 %
Eosinophils Absolute: 0.2 10*3/uL (ref 0.0–0.5)
Eosinophils Relative: 5 %
HCT: 32.8 % — ABNORMAL LOW (ref 36.0–46.0)
Hemoglobin: 11.2 g/dL — ABNORMAL LOW (ref 12.0–15.0)
Immature Granulocytes: 0 %
Lymphocytes Relative: 23 %
Lymphs Abs: 0.9 10*3/uL (ref 0.7–4.0)
MCH: 31.1 pg (ref 26.0–34.0)
MCHC: 34.1 g/dL (ref 30.0–36.0)
MCV: 91.1 fL (ref 80.0–100.0)
Monocytes Absolute: 0.6 10*3/uL (ref 0.1–1.0)
Monocytes Relative: 16 %
Neutro Abs: 2 10*3/uL (ref 1.7–7.7)
Neutrophils Relative %: 55 %
Platelet Count: 244 10*3/uL (ref 150–400)
RBC: 3.6 MIL/uL — ABNORMAL LOW (ref 3.87–5.11)
RDW: 16.2 % — ABNORMAL HIGH (ref 11.5–15.5)
WBC Count: 3.7 10*3/uL — ABNORMAL LOW (ref 4.0–10.5)
nRBC: 0 % (ref 0.0–0.2)

## 2021-06-28 LAB — CMP (CANCER CENTER ONLY)
ALT: 12 U/L (ref 0–44)
AST: 11 U/L — ABNORMAL LOW (ref 15–41)
Albumin: 4 g/dL (ref 3.5–5.0)
Alkaline Phosphatase: 39 U/L (ref 38–126)
Anion gap: 3 — ABNORMAL LOW (ref 5–15)
BUN: 12 mg/dL (ref 6–20)
CO2: 30 mmol/L (ref 22–32)
Calcium: 9.1 mg/dL (ref 8.9–10.3)
Chloride: 107 mmol/L (ref 98–111)
Creatinine: 0.81 mg/dL (ref 0.44–1.00)
GFR, Estimated: >60 mL/min (ref >60)
Glucose, Bld: 86 mg/dL (ref 70–99)
Potassium: 3.9 mmol/L (ref 3.5–5.1)
Sodium: 140 mmol/L (ref 135–145)
Total Bilirubin: 0.9 mg/dL (ref 0.3–1.2)
Total Protein: 6 g/dL — ABNORMAL LOW (ref 6.5–8.1)

## 2021-06-28 LAB — PRETREATMENT RBC PHENOTYPE

## 2021-06-28 LAB — TYPE AND SCREEN
ABO/RH(D): O POS
Antibody Screen: NEGATIVE

## 2021-06-28 MED ORDER — ACETAMINOPHEN 325 MG PO TABS
650.0000 mg | ORAL_TABLET | Freq: Once | ORAL | Status: AC
  Administered 2021-06-28: 650 mg via ORAL
  Filled 2021-06-28: qty 2

## 2021-06-28 MED ORDER — SODIUM CHLORIDE 0.9 % IV SOLN: Freq: Once | INTRAVENOUS | Status: AC

## 2021-06-28 MED ORDER — SODIUM CHLORIDE 0.9 % IV SOLN
10.0000 mg/kg | Freq: Once | INTRAVENOUS | Status: AC
  Administered 2021-06-28: 700 mg via INTRAVENOUS
  Filled 2021-06-28: qty 10

## 2021-06-28 MED ORDER — MONTELUKAST SODIUM 10 MG PO TABS
10.0000 mg | ORAL_TABLET | Freq: Once | ORAL | Status: AC
  Administered 2021-06-28: 10 mg via ORAL
  Filled 2021-06-28: qty 1

## 2021-06-28 MED ORDER — SODIUM CHLORIDE 0.9 % IV SOLN
20.0000 mg | Freq: Once | INTRAVENOUS | Status: AC
  Administered 2021-06-28: 20 mg via INTRAVENOUS
  Filled 2021-06-28: qty 2
  Filled 2021-06-28: qty 20

## 2021-06-28 MED ORDER — FAMOTIDINE IN NACL 20-0.9 MG/50ML-% IV SOLN
20.0000 mg | Freq: Once | INTRAVENOUS | Status: AC
  Administered 2021-06-28: 20 mg via INTRAVENOUS
  Filled 2021-06-28: qty 50

## 2021-06-28 MED ORDER — DIPHENHYDRAMINE HCL 50 MG/ML IJ SOLN
50.0000 mg | Freq: Once | INTRAMUSCULAR | Status: AC
  Administered 2021-06-28: 50 mg via INTRAVENOUS
  Filled 2021-06-28: qty 1

## 2021-06-28 NOTE — Patient Instructions (Signed)
[facility]  Discharge Instructions: Thank you for choosing [facility] to provide your oncology and hematology care.   If you have a lab appointment with the [facility], please go directly to the [facility] and check in at the registration area.   Wear comfortable clothing and clothing appropriate for easy access to any Portacath or PICC line.   We strive to give you quality time with your provider. You may need to reschedule your appointment if you arrive late (15 or more minutes).  Arriving late affects you and other patients whose appointments are after yours.  Also, if you miss three or more appointments without notifying the office, you may be dismissed from the clinic at the provider's discretion.      For prescription refill requests, have your pharmacy contact our office and allow 72 hours for refills to be completed.    Today you received the following chemotherapy and/or immunotherapy agents: Sarclisa.       To help prevent nausea and vomiting after your treatment, we encourage you to take your nausea medication as directed.  BELOW ARE SYMPTOMS THAT SHOULD BE REPORTED IMMEDIATELY: *FEVER GREATER THAN 100.4 F (38 C) OR HIGHER *CHILLS OR SWEATING *NAUSEA AND VOMITING THAT IS NOT CONTROLLED WITH YOUR NAUSEA MEDICATION *UNUSUAL SHORTNESS OF BREATH *UNUSUAL BRUISING OR BLEEDING *URINARY PROBLEMS (pain or burning when urinating, or frequent urination) *BOWEL PROBLEMS (unusual diarrhea, constipation, pain near the anus) TENDERNESS IN MOUTH AND THROAT WITH OR WITHOUT PRESENCE OF ULCERS (sore throat, sores in mouth, or a toothache) UNUSUAL RASH, SWELLING OR PAIN  UNUSUAL VAGINAL DISCHARGE OR ITCHING   Items with * indicate a potential emergency and should be followed up as soon as possible or go to the Emergency Department if any problems should occur.  Please show the CHEMOTHERAPY ALERT CARD or IMMUNOTHERAPY ALERT CARD at check-in to  the Emergency Department and triage nurse.  Should you have questions after your visit or need to cancel or reschedule your appointment, please contact [facility]  Dept: [phone]  and follow the prompts.  Office hours are 8:00 a.m. to 4:30 p.m. Monday - Friday. Please note that voicemails left after 4:00 p.m. may not be returned until the following business day.  We are closed weekends and major holidays. You have access to a nurse at all times for urgent questions. Please call the main number to the clinic Dept: [phone] and follow the prompts.   For any non-urgent questions, you may also contact your provider using MyChart. We now offer e-Visits for anyone 81 and older to request care online for non-urgent symptoms. For details visit [email or web address].   Also download the MyChart app! Go to the app store, search "MyChart", open the app, select [facility], and log in with your MyChart username and password.  Due to Covid, a mask is required upon entering the hospital/clinic. If you do not have a mask, one will be given to you upon arrival. For doctor visits, patients may have 1 support person aged 97 or older with them. For treatment visits, patients cannot have anyone with them due to current Covid guidelines and our immunocompromised population.   Isatuximab Injection What is this medication? ISATUXIMAB (EYE sa TUX i mab) treats a type of bone marrow cancer (multiple myeloma). It works by blocking a protein that causes cancer cells to grow and multiply. This helps slow or stop the spread of cancer cells. This medicine may  be used for other purposes; ask your health care provider or pharmacist if you have questions. COMMON BRAND NAME(S): SARCLISA What should I tell my care team before I take this medication? They need to know if you have any of these conditions: Heart disease Infection especially a viral infection, such as chickenpox, cold sores,  herpes An unusual or allergic reaction to isatuximab, other medications, foods, dyes, or preservatives Pregnant or trying to get pregnant Breast-feeding How should I use this medication? This medication is injected into a vein. It is given by your care team in a hospital or clinic setting. Talk to your care team about the use of this medication in children. Special care may be needed. Overdosage: If you think you have taken too much of this medicine contact a poison control center or emergency room at once. NOTE: This medicine is only for you. Do not share this medicine with others. What if I miss a dose? Keep appointments for follow-up doses. It is important not to miss your dose. Call your care team if you are unable to keep an appointment. What may interact with this medication? Interactions have not been studied. This list may not describe all possible interactions. Give your health care provider a list of all the medicines, herbs, non-prescription drugs, or dietary supplements you use. Also tell them if you smoke, drink alcohol, or use illegal drugs. Some items may interact with your medicine. What should I watch for while using this medication? Your condition will be monitored carefully while you are receiving this medication. You may need blood work done while you are taking this medication. This medication may increase your risk of getting an infection. Call your care team for advice if you get a fever, chills, sore throat, or other symptoms of a cold or flu. Do not treat yourself. Try to avoid being around people who are sick. If you have not had the measles or chickenpox vaccines, tell your care team right away if you are around someone with these viruses. Talk to your care team about your risk of cancer. You may be more at risk for certain types of cancers if you take this medication. This medication can affect the results of blood tests to match your blood type. Your care team will do  blood tests to match your blood type before you start treatment. Tell your care team that you are being treated with this medication before receiving a blood transfusion. Do not become pregnant while taking this medicine or for at least 5 months after stopping it. Inform your doctor if you wish to become pregnant or think you might be pregnant. There is a potential for serious side effects to an unborn child. Talk to your care team for more information. Do not breast-feed while taking this medicine. What side effects may I notice from receiving this medication? Side effects that you should report to your care team as soon as possible: Allergic reactions--skin rash, itching, hives, swelling of the face, lips, tongue, or throat Heart failure--shortness of breath, swelling of the angles, feet, or hands, sudden weight gain, unusual weakness or fatigue Infection--fever, chills, cough, or sore throat Infusion reactions--chest pain, shortness of breath or trouble breathing, feeling faint or lightheaded Side effects that usually do not require medical attention (report these to your care team if they continue or are bothersome): Back pain Diarrhea Increase in blood pressure Trouble sleeping Vomiting This list may not describe all possible side effects. Call your doctor for medical advice  about side effects. You may report side effects to FDA at 1-800-FDA-1088. Where should I keep my medication? This medication is given in a hospital or clinic. It will not be stored at home. NOTE: This sheet is a summary. It may not cover all possible information. If you have questions about this medicine, talk to your doctor, pharmacist, or health care provider.  2023 Elsevier/Gold Standard (2020-12-07 00:00:00)

## 2021-07-01 ENCOUNTER — Telehealth: Payer: Self-pay | Admitting: *Deleted

## 2021-07-01 NOTE — Telephone Encounter (Signed)
Called pt to see how she did with her new treatment.  She reports doing OK.  She was concerned about the number of IV sticks that she receives. She reports getting stuck multiple times every time.  Discussed some options to help with this.  She will talk with Dr Irene Limbo about a port at next visit.  She knows how to reach [place] if needed.

## 2021-07-01 NOTE — Telephone Encounter (Signed)
-----   Message from Willis Modena, RN sent at 06/28/2021  6:33 PM EDT ----- Regarding: Dr. Irene Limbo 1st time [facility] f/u tol well Pt tolerated 1st time [place] well. Pt call back due.

## 2021-07-03 ENCOUNTER — Other Ambulatory Visit: Payer: Self-pay | Admitting: *Deleted

## 2021-07-03 DIAGNOSIS — C9000 Multiple myeloma not having achieved remission: Secondary | ICD-10-CM

## 2021-07-04 MED FILL — Dexamethasone Sodium Phosphate Inj 100 MG/10ML: INTRAMUSCULAR | Qty: 2 | Status: AC

## 2021-07-05 ENCOUNTER — Inpatient Hospital Stay: Payer: 59

## 2021-07-05 ENCOUNTER — Other Ambulatory Visit: Payer: Self-pay

## 2021-07-05 ENCOUNTER — Other Ambulatory Visit: Payer: 59

## 2021-07-05 ENCOUNTER — Inpatient Hospital Stay (HOSPITAL_BASED_OUTPATIENT_CLINIC_OR_DEPARTMENT_OTHER): Payer: 59 | Admitting: Hematology

## 2021-07-05 VITALS — BP 94/69 | HR 66 | Temp 97.5°F | Resp 16 | Ht 66.0 in | Wt 163.7 lb

## 2021-07-05 VITALS — BP 97/66 | HR 64 | Temp 98.1°F | Resp 16

## 2021-07-05 DIAGNOSIS — C9000 Multiple myeloma not having achieved remission: Secondary | ICD-10-CM | POA: Diagnosis not present

## 2021-07-05 DIAGNOSIS — Z5111 Encounter for antineoplastic chemotherapy: Secondary | ICD-10-CM

## 2021-07-05 DIAGNOSIS — Z7189 Other specified counseling: Secondary | ICD-10-CM

## 2021-07-05 DIAGNOSIS — Z5112 Encounter for antineoplastic immunotherapy: Secondary | ICD-10-CM | POA: Diagnosis not present

## 2021-07-05 DIAGNOSIS — C7951 Secondary malignant neoplasm of bone: Secondary | ICD-10-CM

## 2021-07-05 LAB — CBC WITH DIFFERENTIAL (CANCER CENTER ONLY)
Abs Immature Granulocytes: 0.01 10*3/uL (ref 0.00–0.07)
Basophils Absolute: 0 10*3/uL (ref 0.0–0.1)
Basophils Relative: 0 %
Eosinophils Absolute: 0.2 10*3/uL (ref 0.0–0.5)
Eosinophils Relative: 9 %
HCT: 33.2 % — ABNORMAL LOW (ref 36.0–46.0)
Hemoglobin: 11.4 g/dL — ABNORMAL LOW (ref 12.0–15.0)
Immature Granulocytes: 0 %
Lymphocytes Relative: 21 %
Lymphs Abs: 0.6 10*3/uL — ABNORMAL LOW (ref 0.7–4.0)
MCH: 31.2 pg (ref 26.0–34.0)
MCHC: 34.3 g/dL (ref 30.0–36.0)
MCV: 91 fL (ref 80.0–100.0)
Monocytes Absolute: 0.4 10*3/uL (ref 0.1–1.0)
Monocytes Relative: 15 %
Neutro Abs: 1.5 10*3/uL — ABNORMAL LOW (ref 1.7–7.7)
Neutrophils Relative %: 55 %
Platelet Count: 170 10*3/uL (ref 150–400)
RBC: 3.65 MIL/uL — ABNORMAL LOW (ref 3.87–5.11)
RDW: 15.7 % — ABNORMAL HIGH (ref 11.5–15.5)
WBC Count: 2.7 10*3/uL — ABNORMAL LOW (ref 4.0–10.5)
nRBC: 0 % (ref 0.0–0.2)

## 2021-07-05 LAB — CMP (CANCER CENTER ONLY)
ALT: 11 U/L (ref 0–44)
AST: 11 U/L — ABNORMAL LOW (ref 15–41)
Albumin: 4.1 g/dL (ref 3.5–5.0)
Alkaline Phosphatase: 41 U/L (ref 38–126)
Anion gap: 5 (ref 5–15)
BUN: 19 mg/dL (ref 6–20)
CO2: 28 mmol/L (ref 22–32)
Calcium: 9.2 mg/dL (ref 8.9–10.3)
Chloride: 107 mmol/L (ref 98–111)
Creatinine: 0.85 mg/dL (ref 0.44–1.00)
GFR, Estimated: >60 mL/min (ref >60)
Glucose, Bld: 88 mg/dL (ref 70–99)
Potassium: 3.8 mmol/L (ref 3.5–5.1)
Sodium: 140 mmol/L (ref 135–145)
Total Bilirubin: 0.8 mg/dL (ref 0.3–1.2)
Total Protein: 6.3 g/dL — ABNORMAL LOW (ref 6.5–8.1)

## 2021-07-05 MED ORDER — ACETAMINOPHEN 325 MG PO TABS
650.0000 mg | ORAL_TABLET | Freq: Once | ORAL | Status: AC
  Administered 2021-07-05: 650 mg via ORAL
  Filled 2021-07-05: qty 2

## 2021-07-05 MED ORDER — MONTELUKAST SODIUM 10 MG PO TABS
10.0000 mg | ORAL_TABLET | Freq: Once | ORAL | Status: AC
  Administered 2021-07-05: 10 mg via ORAL
  Filled 2021-07-05: qty 1

## 2021-07-05 MED ORDER — SODIUM CHLORIDE 0.9 % IV SOLN
10.0000 mg/kg | Freq: Once | INTRAVENOUS | Status: AC
  Administered 2021-07-05: 700 mg via INTRAVENOUS
  Filled 2021-07-05: qty 25

## 2021-07-05 MED ORDER — DIPHENHYDRAMINE HCL 50 MG/ML IJ SOLN
50.0000 mg | Freq: Once | INTRAMUSCULAR | Status: AC
  Administered 2021-07-05: 50 mg via INTRAVENOUS
  Filled 2021-07-05: qty 1

## 2021-07-05 MED ORDER — FAMOTIDINE IN NACL 20-0.9 MG/50ML-% IV SOLN
20.0000 mg | Freq: Once | INTRAVENOUS | Status: AC
  Administered 2021-07-05: 20 mg via INTRAVENOUS
  Filled 2021-07-05: qty 50

## 2021-07-05 MED ORDER — SODIUM CHLORIDE 0.9 % IV SOLN
20.0000 mg | Freq: Once | INTRAVENOUS | Status: AC
  Administered 2021-07-05: 20 mg via INTRAVENOUS
  Filled 2021-07-05: qty 20

## 2021-07-05 MED ORDER — SODIUM CHLORIDE 0.9 % IV SOLN: Freq: Once | INTRAVENOUS | Status: AC

## 2021-07-05 NOTE — Patient Instructions (Signed)
[facility]COL[facility]  Discharge Instructions: Thank you for choosing [facility] to provide your oncology and hematology care.   If you have a lab appointment with the [facility], please go directly to the [facility] and check in at the registration area.   Wear comfortable clothing and clothing appropriate for easy access to any Portacath or PICC line.   We strive to give you quality time with your provider. You may need to reschedule your appointment if you arrive late (15 or more minutes).  Arriving late affects you and other patients whose appointments are after yours.  Also, if you miss three or more appointments without notifying the office, you may be dismissed from the clinic at the provider's discretion.      For prescription refill requests, have your pharmacy contact our office and allow 72 hours for refills to be completed.    Today you received the following chemotherapy and/or immunotherapy agents: Sarclisa      To help prevent nausea and vomiting after your treatment, we encourage you to take your nausea medication as directed.  BELOW ARE SYMPTOMS THAT SHOULD BE REPORTED IMMEDIATELY: *FEVER GREATER THAN 100.4 F (38 C) OR HIGHER *CHILLS OR SWEATING *NAUSEA AND VOMITING THAT IS NOT CONTROLLED WITH YOUR NAUSEA MEDICATION *UNUSUAL SHORTNESS OF BREATH *UNUSUAL BRUISING OR BLEEDING *URINARY PROBLEMS (pain or burning when urinating, or frequent urination) *BOWEL PROBLEMS (unusual diarrhea, constipation, pain near the anus) TENDERNESS IN MOUTH AND THROAT WITH OR WITHOUT PRESENCE OF ULCERS (sore throat, sores in mouth, or a toothache) UNUSUAL RASH, SWELLING OR PAIN  UNUSUAL VAGINAL DISCHARGE OR ITCHING   Items with * indicate a potential emergency and should be followed up as soon as possible or go to the Emergency Department if any problems should occur.  Please show the CHEMOTHERAPY ALERT CARD or IMMUNOTHERAPY ALERT CARD at check-in to  the Emergency Department and triage nurse.  Should you have questions after your visit or need to cancel or reschedule your appointment, please contact [facility]  Dept: [phone]  and follow the prompts.  Office hours are 8:00 a.m. to 4:30 p.m. Monday - Friday. Please note that voicemails left after 4:00 p.m. may not be returned until the following business day.  We are closed weekends and major holidays. You have access to a nurse at all times for urgent questions. Please call the main number to the clinic Dept: [phone] and follow the prompts.   For any non-urgent questions, you may also contact your provider using MyChart. We now offer e-Visits for anyone 57 and older to request care online for non-urgent symptoms. For details visit [email or web address].   Also download the MyChart app! Go to the app store, search "MyChart", open the app, select [facility], and log in with your MyChart username and password.  Masks are optional in the cancer centers. If you would like for your care team to wear a mask while they are taking care of you, please let them know. For doctor visits, patients may have with them one support person who is at least 58 years old. At this time, visitors are not allowed in the infusion area.

## 2021-07-08 ENCOUNTER — Other Ambulatory Visit (HOSPITAL_COMMUNITY): Payer: Self-pay

## 2021-07-08 LAB — KAPPA/LAMBDA LIGHT CHAINS
Kappa free light chain: 11.5 mg/L (ref 3.3–19.4)
Kappa, lambda light chain ratio: 0.85 (ref 0.26–1.65)
Lambda free light chains: 13.5 mg/L (ref 5.7–26.3)

## 2021-07-08 MED ORDER — FLUCONAZOLE 150 MG PO TABS
ORAL_TABLET | ORAL | 0 refills | Status: DC
  Filled 2021-07-08: qty 3, 7d supply, fill #0

## 2021-07-08 MED ORDER — METRONIDAZOLE 500 MG PO TABS
ORAL_TABLET | ORAL | 0 refills | Status: DC
  Filled 2021-07-08: qty 14, 7d supply, fill #0

## 2021-07-09 ENCOUNTER — Telehealth: Payer: Self-pay | Admitting: Hematology

## 2021-07-09 NOTE — Telephone Encounter (Signed)
Scheduled follow-up appointments per 6/16 los. Patient is aware.

## 2021-07-10 ENCOUNTER — Telehealth: Payer: Self-pay | Admitting: Hematology

## 2021-07-10 ENCOUNTER — Telehealth: Payer: Self-pay | Admitting: *Deleted

## 2021-07-10 NOTE — Telephone Encounter (Signed)
[employer] paperwork completed today, placed in provider in-basket for collaborative pickup for provider review, signature and return to this nurse.

## 2021-07-10 NOTE — Telephone Encounter (Signed)
.  Called patient to schedule appointment per 6/19 inbasket, patient is aware of date and time, pt did not accomodate req to r/s 6/23 appt

## 2021-07-11 ENCOUNTER — Other Ambulatory Visit: Payer: Self-pay | Admitting: Physician Assistant

## 2021-07-11 ENCOUNTER — Encounter: Payer: Self-pay | Admitting: Hematology

## 2021-07-11 DIAGNOSIS — C9000 Multiple myeloma not having achieved remission: Secondary | ICD-10-CM

## 2021-07-11 LAB — MULTIPLE MYELOMA PANEL, SERUM
Albumin SerPl Elph-Mcnc: 3.5 g/dL (ref 2.9–4.4)
Albumin/Glob SerPl: 1.6 (ref 0.7–1.7)
Alpha 1: 0.2 g/dL (ref 0.0–0.4)
Alpha2 Glob SerPl Elph-Mcnc: 0.6 g/dL (ref 0.4–1.0)
B-Globulin SerPl Elph-Mcnc: 0.9 g/dL (ref 0.7–1.3)
Gamma Glob SerPl Elph-Mcnc: 0.5 g/dL (ref 0.4–1.8)
Globulin, Total: 2.2 g/dL (ref 2.2–3.9)
IgA: 32 mg/dL — ABNORMAL LOW (ref 87–352)
IgG (Immunoglobin G), Serum: 559 mg/dL — ABNORMAL LOW (ref 586–1602)
IgM (Immunoglobulin M), Srm: 8 mg/dL — ABNORMAL LOW (ref 26–217)
Total Protein ELP: 5.7 g/dL — ABNORMAL LOW (ref 6.0–8.5)

## 2021-07-11 NOTE — Telephone Encounter (Signed)
[employer] Cancer Claim form successfully faxed to [phone] at this time.   Original copy to alphabetical file folder behind appointment registration area one for patient pick-up.    Copy to "Record Release" bin in front office area before Managed Care for [facility].I.M. staff to forward to [facility] ([place]) Information Management Office, Phone: [phone], Fax: [phone] to complete process  No further completed form delivery actions performed or further instructions received.

## 2021-07-12 ENCOUNTER — Other Ambulatory Visit: Payer: Self-pay

## 2021-07-12 ENCOUNTER — Other Ambulatory Visit: Payer: Self-pay | Admitting: Hematology

## 2021-07-12 ENCOUNTER — Inpatient Hospital Stay: Payer: 59

## 2021-07-12 VITALS — BP 106/61 | HR 65 | Temp 99.5°F | Resp 16 | Wt 162.0 lb

## 2021-07-12 DIAGNOSIS — Z7189 Other specified counseling: Secondary | ICD-10-CM

## 2021-07-12 DIAGNOSIS — C9000 Multiple myeloma not having achieved remission: Secondary | ICD-10-CM

## 2021-07-12 DIAGNOSIS — C7951 Secondary malignant neoplasm of bone: Secondary | ICD-10-CM

## 2021-07-12 DIAGNOSIS — Z5112 Encounter for antineoplastic immunotherapy: Secondary | ICD-10-CM | POA: Diagnosis not present

## 2021-07-12 LAB — CBC WITH DIFFERENTIAL (CANCER CENTER ONLY)
Abs Immature Granulocytes: 0 10*3/uL (ref 0.00–0.07)
Basophils Absolute: 0 10*3/uL (ref 0.0–0.1)
Basophils Relative: 1 %
Eosinophils Absolute: 0.2 10*3/uL (ref 0.0–0.5)
Eosinophils Relative: 10 %
HCT: 32.7 % — ABNORMAL LOW (ref 36.0–46.0)
Hemoglobin: 11.7 g/dL — ABNORMAL LOW (ref 12.0–15.0)
Immature Granulocytes: 0 %
Lymphocytes Relative: 35 %
Lymphs Abs: 0.7 10*3/uL (ref 0.7–4.0)
MCH: 31.6 pg (ref 26.0–34.0)
MCHC: 35.8 g/dL (ref 30.0–36.0)
MCV: 88.4 fL (ref 80.0–100.0)
Monocytes Absolute: 0.3 10*3/uL (ref 0.1–1.0)
Monocytes Relative: 16 %
Neutro Abs: 0.8 10*3/uL — ABNORMAL LOW (ref 1.7–7.7)
Neutrophils Relative %: 38 %
Platelet Count: 138 10*3/uL — ABNORMAL LOW (ref 150–400)
RBC: 3.7 MIL/uL — ABNORMAL LOW (ref 3.87–5.11)
RDW: 15.2 % (ref 11.5–15.5)
WBC Count: 2.1 10*3/uL — ABNORMAL LOW (ref 4.0–10.5)
nRBC: 0 % (ref 0.0–0.2)

## 2021-07-12 LAB — LACTATE DEHYDROGENASE: LDH: 102 U/L (ref 98–192)

## 2021-07-12 LAB — CMP (CANCER CENTER ONLY)
ALT: 12 U/L (ref 0–44)
AST: 12 U/L — ABNORMAL LOW (ref 15–41)
Albumin: 4.2 g/dL (ref 3.5–5.0)
Alkaline Phosphatase: 40 U/L (ref 38–126)
Anion gap: 7 (ref 5–15)
BUN: 15 mg/dL (ref 6–20)
CO2: 27 mmol/L (ref 22–32)
Calcium: 9.4 mg/dL (ref 8.9–10.3)
Chloride: 103 mmol/L (ref 98–111)
Creatinine: 0.96 mg/dL (ref 0.44–1.00)
GFR, Estimated: >60 mL/min (ref >60)
Glucose, Bld: 126 mg/dL — ABNORMAL HIGH (ref 70–99)
Potassium: 3.7 mmol/L (ref 3.5–5.1)
Sodium: 137 mmol/L (ref 135–145)
Total Bilirubin: 1 mg/dL (ref 0.3–1.2)
Total Protein: 6.3 g/dL — ABNORMAL LOW (ref 6.5–8.1)

## 2021-07-12 MED ORDER — FAMOTIDINE IN NACL 20-0.9 MG/50ML-% IV SOLN
20.0000 mg | Freq: Once | INTRAVENOUS | Status: AC
  Administered 2021-07-12: 20 mg via INTRAVENOUS
  Filled 2021-07-12: qty 50

## 2021-07-12 MED ORDER — ACETAMINOPHEN 325 MG PO TABS
650.0000 mg | ORAL_TABLET | Freq: Once | ORAL | Status: AC
  Administered 2021-07-12: 650 mg via ORAL
  Filled 2021-07-12: qty 2

## 2021-07-12 MED ORDER — SODIUM CHLORIDE 0.9 % IV SOLN
20.0000 mg | Freq: Once | INTRAVENOUS | Status: AC
  Administered 2021-07-12: 20 mg via INTRAVENOUS
  Filled 2021-07-12: qty 20

## 2021-07-12 MED ORDER — MONTELUKAST SODIUM 10 MG PO TABS
10.0000 mg | ORAL_TABLET | Freq: Once | ORAL | Status: AC
  Administered 2021-07-12: 10 mg via ORAL
  Filled 2021-07-12: qty 1

## 2021-07-12 MED ORDER — DIPHENHYDRAMINE HCL 50 MG/ML IJ SOLN
50.0000 mg | Freq: Once | INTRAMUSCULAR | Status: AC
  Administered 2021-07-12: 50 mg via INTRAVENOUS
  Filled 2021-07-12: qty 1

## 2021-07-12 MED ORDER — SODIUM CHLORIDE 0.9 % IV SOLN
10.0000 mg/kg | Freq: Once | INTRAVENOUS | Status: AC
  Administered 2021-07-12: 700 mg via INTRAVENOUS
  Filled 2021-07-12: qty 25

## 2021-07-12 MED ORDER — SODIUM CHLORIDE 0.9 % IV SOLN: Freq: Once | INTRAVENOUS | Status: AC

## 2021-07-12 NOTE — Progress Notes (Signed)
Pt reported some mild blurry vision upon waking up for the past week. Instructed patient to call clinic if symptoms worsened.

## 2021-07-18 ENCOUNTER — Other Ambulatory Visit: Payer: Self-pay

## 2021-07-18 DIAGNOSIS — C9000 Multiple myeloma not having achieved remission: Secondary | ICD-10-CM

## 2021-07-18 MED FILL — Dexamethasone Sodium Phosphate Inj 100 MG/10ML: INTRAMUSCULAR | Qty: 2 | Status: AC

## 2021-07-19 ENCOUNTER — Inpatient Hospital Stay: Payer: 59

## 2021-07-19 ENCOUNTER — Other Ambulatory Visit: Payer: Self-pay

## 2021-07-19 DIAGNOSIS — C9000 Multiple myeloma not having achieved remission: Secondary | ICD-10-CM

## 2021-07-19 DIAGNOSIS — Z5112 Encounter for antineoplastic immunotherapy: Secondary | ICD-10-CM | POA: Diagnosis not present

## 2021-07-19 LAB — CBC WITH DIFFERENTIAL (CANCER CENTER ONLY)
Abs Immature Granulocytes: 0.01 10*3/uL (ref 0.00–0.07)
Basophils Absolute: 0 10*3/uL (ref 0.0–0.1)
Basophils Relative: 1 %
Eosinophils Absolute: 0.1 10*3/uL (ref 0.0–0.5)
Eosinophils Relative: 5 %
HCT: 31.4 % — ABNORMAL LOW (ref 36.0–46.0)
Hemoglobin: 11.2 g/dL — ABNORMAL LOW (ref 12.0–15.0)
Immature Granulocytes: 1 %
Lymphocytes Relative: 53 %
Lymphs Abs: 0.9 10*3/uL (ref 0.7–4.0)
MCH: 31.9 pg (ref 26.0–34.0)
MCHC: 35.7 g/dL (ref 30.0–36.0)
MCV: 89.5 fL (ref 80.0–100.0)
Monocytes Absolute: 0.5 10*3/uL (ref 0.1–1.0)
Monocytes Relative: 26 %
Neutro Abs: 0.2 10*3/uL — CL (ref 1.7–7.7)
Neutrophils Relative %: 14 %
Platelet Count: 163 10*3/uL (ref 150–400)
RBC: 3.51 MIL/uL — ABNORMAL LOW (ref 3.87–5.11)
RDW: 15.4 % (ref 11.5–15.5)
Smear Review: NORMAL
WBC Count: 1.7 10*3/uL — ABNORMAL LOW (ref 4.0–10.5)
nRBC: 0 % (ref 0.0–0.2)

## 2021-07-19 LAB — CMP (CANCER CENTER ONLY)
ALT: 10 U/L (ref 0–44)
AST: 11 U/L — ABNORMAL LOW (ref 15–41)
Albumin: 3.9 g/dL (ref 3.5–5.0)
Alkaline Phosphatase: 32 U/L — ABNORMAL LOW (ref 38–126)
Anion gap: 5 (ref 5–15)
BUN: 16 mg/dL (ref 6–20)
CO2: 27 mmol/L (ref 22–32)
Calcium: 9.2 mg/dL (ref 8.9–10.3)
Chloride: 105 mmol/L (ref 98–111)
Creatinine: 0.86 mg/dL (ref 0.44–1.00)
GFR, Estimated: >60 mL/min (ref >60)
Glucose, Bld: 88 mg/dL (ref 70–99)
Potassium: 4.2 mmol/L (ref 3.5–5.1)
Sodium: 137 mmol/L (ref 135–145)
Total Bilirubin: 0.7 mg/dL (ref 0.3–1.2)
Total Protein: 6 g/dL — ABNORMAL LOW (ref 6.5–8.1)

## 2021-07-19 NOTE — Progress Notes (Deleted)
cri 

## 2021-07-19 NOTE — Progress Notes (Signed)
CRITICAL VALUE STICKER  CRITICAL VALUE: ANC 0.2  RECEIVER (on-site recipient of call): Marquis Lunch RN  DATE & TIME NOTIFIED: 07/19/21 1508  MESSENGER (representative from lab):  MD NOTIFIED: kale  TIME OF NOTIFICATION:1508  RESPONSE:  treatment held

## 2021-07-22 ENCOUNTER — Inpatient Hospital Stay: Payer: 59

## 2021-07-22 ENCOUNTER — Inpatient Hospital Stay: Payer: 59 | Admitting: Physician Assistant

## 2021-07-22 ENCOUNTER — Other Ambulatory Visit: Payer: Self-pay | Admitting: Nurse Practitioner

## 2021-07-22 DIAGNOSIS — C9000 Multiple myeloma not having achieved remission: Secondary | ICD-10-CM

## 2021-07-24 ENCOUNTER — Other Ambulatory Visit (HOSPITAL_COMMUNITY): Payer: Self-pay

## 2021-07-24 MED ORDER — ATORVASTATIN CALCIUM 10 MG PO TABS
10.0000 mg | ORAL_TABLET | Freq: Every day | ORAL | 1 refills | Status: DC
  Filled 2021-07-24: qty 30, 30d supply, fill #0
  Filled 2021-08-22: qty 30, 30d supply, fill #1

## 2021-07-25 ENCOUNTER — Other Ambulatory Visit: Payer: Self-pay | Admitting: Surgery

## 2021-07-25 DIAGNOSIS — C9000 Multiple myeloma not having achieved remission: Secondary | ICD-10-CM

## 2021-07-25 DIAGNOSIS — D649 Anemia, unspecified: Secondary | ICD-10-CM

## 2021-07-25 MED FILL — Dexamethasone Sodium Phosphate Inj 100 MG/10ML: INTRAMUSCULAR | Qty: 2 | Status: AC

## 2021-07-26 ENCOUNTER — Other Ambulatory Visit: Payer: Self-pay | Admitting: Radiology

## 2021-07-26 ENCOUNTER — Inpatient Hospital Stay: Payer: 59

## 2021-07-29 ENCOUNTER — Inpatient Hospital Stay (HOSPITAL_COMMUNITY): Admission: RE | Admit: 2021-07-29 | Payer: 59 | Source: Ambulatory Visit

## 2021-07-29 ENCOUNTER — Ambulatory Visit (INDEPENDENT_AMBULATORY_CARE_PROVIDER_SITE_OTHER): Payer: Commercial Managed Care - PPO | Admitting: Nurse Practitioner

## 2021-07-29 ENCOUNTER — Telehealth: Payer: Self-pay | Admitting: Hematology

## 2021-07-29 ENCOUNTER — Encounter: Payer: Self-pay | Admitting: Nurse Practitioner

## 2021-07-29 ENCOUNTER — Other Ambulatory Visit (HOSPITAL_COMMUNITY): Payer: Self-pay

## 2021-07-29 ENCOUNTER — Ambulatory Visit (HOSPITAL_COMMUNITY): Payer: 59

## 2021-07-29 ENCOUNTER — Encounter: Payer: Self-pay | Admitting: Hematology

## 2021-07-29 VITALS — BP 130/74 | HR 73 | Temp 98.7°F | Ht 66.0 in | Wt 160.0 lb

## 2021-07-29 DIAGNOSIS — Z0000 Encounter for general adult medical examination without abnormal findings: Secondary | ICD-10-CM | POA: Diagnosis not present

## 2021-07-29 DIAGNOSIS — F4321 Adjustment disorder with depressed mood: Secondary | ICD-10-CM

## 2021-07-29 DIAGNOSIS — I10 Essential (primary) hypertension: Secondary | ICD-10-CM | POA: Diagnosis not present

## 2021-07-29 DIAGNOSIS — R7303 Prediabetes: Secondary | ICD-10-CM | POA: Diagnosis not present

## 2021-07-29 DIAGNOSIS — Z634 Disappearance and death of family member: Secondary | ICD-10-CM

## 2021-07-29 DIAGNOSIS — C9000 Multiple myeloma not having achieved remission: Secondary | ICD-10-CM

## 2021-07-29 DIAGNOSIS — E782 Mixed hyperlipidemia: Secondary | ICD-10-CM

## 2021-07-29 DIAGNOSIS — D496 Neoplasm of unspecified behavior of brain: Secondary | ICD-10-CM

## 2021-07-29 LAB — POCT URINALYSIS DIPSTICK
Bilirubin, UA: NEGATIVE
Glucose, UA: NEGATIVE
Leukocytes, UA: NEGATIVE
Nitrite, UA: NEGATIVE
Protein, UA: NEGATIVE
Spec Grav, UA: 1.02 (ref 1.010–1.025)
Urobilinogen, UA: 2 E.U./dL — AB
pH, UA: 7 (ref 5.0–8.0)

## 2021-07-29 MED ORDER — BENAZEPRIL HCL 5 MG PO TABS
5.0000 mg | ORAL_TABLET | Freq: Every day | ORAL | 1 refills | Status: DC
  Filled 2021-07-29: qty 90, 90d supply, fill #0

## 2021-07-29 NOTE — Progress Notes (Signed)
I,Alexandra [name],acting as a Education administrator for [employer], FNP.,have documented all relevant documentation on the behalf of Alexandra Brine, FNP,as directed by  Alexandra Brine, FNP while in the presence of Alexandra [name], [place].  Subjective:     Patient ID: Alexandra [name] , female    DOB: 12-Apr-1965 , 56 y.o.   MRN: [id number]   Chief Complaint  Patient presents with   Annual Exam    HPI  Patient is here for physical exam. She is followed by Alexandra [name] for her GYN care [facility]. She is also seen by the oncologist Alexandra [name] every Monday.  She is compliant with her medication.    She found her daughter deceased at home thought to be an overdose  She had 10 days of radiation in June for a second tumor on left side of head. She is on chemo treatment - every other week until Sept. She is to go Thursday to start back up if her immune system.      Past Medical History:  Diagnosis Date   Anxiety    Hypertension    Pre-diabetes      Family History  Problem Relation Age of Onset   Breast cancer Maternal Grandmother    Heart disease Mother    Hypertension Mother    Sarcoidosis Father    Diabetes Maternal Grandfather      Current Outpatient Medications:    atorvastatin (LIPITOR) 10 MG tablet, Take 1 tablet (10 mg total) by mouth daily., Disp: 30 tablet, Rfl: 1   benazepril (LOTENSIN) 5 MG tablet, Take 1 tablet (5 mg total) by mouth daily., Disp: 90 tablet, Rfl: 1   docusate sodium (COLACE) 100 MG capsule, Take 100 mg by mouth 2 (two) times daily., Disp: , Rfl:    hydrochlorothiazide (HYDRODIURIL) 12.5 MG tablet, Take 1 tablet by mouth daily as needed for leg swelling, Disp: 90 tablet, Rfl: 1   levocetirizine (XYZAL) 5 MG tablet, TAKE 1 TABLET BY MOUTH EVERY DAY IN THE EVENING, Disp: 90 tablet, Rfl: 3   traZODone (DESYREL) 50 MG tablet, TAKE 1 TO 2 TABLETS BY MOUTH AT BEDTIME AS NEEDED FOR SLEEP, Disp: 180 tablet, Rfl: 1   valACYclovir (VALTREX) 500 MG tablet, Take 1 tablet by mouth 2 times a  day, Disp: 60 tablet, Rfl: 10   azithromycin (ZITHROMAX) 250 MG tablet, Take 2 tablets by mouth daily for 1 day, then take 1 tablet daily for 4 days., Disp: 6 tablet, Rfl: 0   fluconazole (DIFLUCAN) 150 MG tablet, Take 1 tablet by mouth every 3 days, Disp: 2 tablet, Rfl: 0   OZEMPIC, 1 MG/DOSE, 4 MG/3ML SOPN, INJECT 1 MG UNDER THE SKIN ONCE WEEKLY, ON THE SAME DAY., Disp: 9 mL, Rfl: 1   POMALYST 3 MG capsule, TAKE 1 CAPSULE BY MOUTH ONCE DAILY FOR 21 DAYS ON AND 7 DAYS OFF, Disp: 21 capsule, Rfl: 0   No Known Allergies    The patient states she is post menopausal status.  Patient's last menstrual period was 07/09/2015.. Negative for Dysmenorrhea and Negative for Menorrhagia. Negative for: breast discharge, breast lump(s), breast pain and breast self exam. Associated symptoms include abnormal vaginal bleeding. Pertinent negatives include abnormal bleeding (hematology), anxiety, decreased libido, depression, difficulty falling sleep, dyspareunia, history of infertility, nocturia, sexual dysfunction, sleep disturbances, urinary incontinence, urinary urgency, vaginal discharge and vaginal itching. Diet regular. he patient states her exercise level is minimal with 3 times a week 2 miles a day. She has cut her hours back at  [employer]. She had been working 2 full time jobs she is tired.   The patient's tobacco use is:  Social History   Tobacco Use  Smoking Status Never  Smokeless Tobacco Never  She has been exposed to passive smoke. The patient's alcohol use is:  Social History   Substance and Sexual Activity  Alcohol Use Yes   Comment: occassionally     Review of Systems  Constitutional: Negative.   HENT: Negative.    Eyes: Negative.   Respiratory: Negative.    Cardiovascular: Negative.   Gastrointestinal: Negative.   Endocrine: Negative.   Genitourinary: Negative.   Musculoskeletal: Negative.   Skin: Negative.   Allergic/Immunologic: Negative.   Neurological: Negative.    Hematological: Negative.   Psychiatric/Behavioral: Negative.       Today's Vitals   07/29/21 1528  BP: 130/74  Pulse: 73  Temp: 98.7 F (37.1 C)  TempSrc: Oral  Weight: 160 lb (72.6 kg)  Height: 5' 6"  (1.676 m)   Body mass index is 25.82 kg/m.  Wt Readings from Last 3 Encounters:  08/23/21 165 lb 11.2 oz (75.2 kg)  08/09/21 163 lb 8 oz (74.2 kg)  08/01/21 163 lb 4 oz (74 kg)    Objective:  Physical Exam Constitutional:      General: She is not in acute distress.    Appearance: Normal appearance. She is well-developed.  HENT:     Head: Normocephalic.     Comments: Mild indention on left side of forehead from partial craniotomy    Right Ear: Hearing, tympanic membrane, ear canal and external ear normal.     Left Ear: Hearing, tympanic membrane, ear canal and external ear normal.     Nose: Nose normal.     Mouth/Throat:     Mouth: Mucous membranes are moist.  Eyes:     General: Lids are normal.     Extraocular Movements: Extraocular movements intact.     Conjunctiva/sclera: Conjunctivae normal.     Pupils: Pupils are equal, round, and reactive to light.     Funduscopic exam:    Right eye: No papilledema.        Left eye: No papilledema.  Neck:     Thyroid: No thyroid mass.     Vascular: No carotid bruit.  Cardiovascular:     Rate and Rhythm: Normal rate and regular rhythm.     Pulses: Normal pulses.     Heart sounds: Normal heart sounds. No murmur heard. Pulmonary:     Effort: Pulmonary effort is normal.     Breath sounds: Normal breath sounds.  Chest:     Chest wall: No mass.  Breasts:    Tanner Score is 5.     Right: Normal.     Left: Normal.  Abdominal:     General: Bowel sounds are normal.     Palpations: Abdomen is soft.     Tenderness: There is no abdominal tenderness.  Musculoskeletal:        General: Normal range of motion.     Cervical back: Full passive range of motion without pain, normal range of motion and neck supple.  Lymphadenopathy:      Upper Body:     Right upper body: No supraclavicular, axillary or pectoral adenopathy.     Left upper body: No supraclavicular, axillary or pectoral adenopathy.  Skin:    General: Skin is warm and dry.     Capillary Refill: Capillary refill takes less than 2 seconds.  Neurological:  General: No focal deficit present.     Mental Status: She is alert and oriented to person, place, and time.     Cranial Nerves: No cranial nerve deficit.     Sensory: No sensory deficit.  Psychiatric:        Mood and Affect: Mood normal.        Behavior: Behavior normal.        Thought Content: Thought content normal.        Judgment: Judgment normal.         Assessment And Plan:     1. Routine general medical examination at a health care facility Behavior modifications discussed and diet history reviewed.   Pt will continue to exercise regularly and modify diet with low GI, plant based foods and decrease intake of processed foods.  Recommend intake of daily multivitamin, Vitamin D, and calcium.  Recommend mammogram and colonoscopy for preventive screenings, as well as recommend immunizations that include influenza, TDAP, and Shingles (up to date)  2. Essential hypertension Comments: Blood pressure is controlled, continue current medications - POCT Urinalysis Dipstick (81002) - EKG 12-Lead - Microalbumin / Creatinine Urine Ratio - benazepril (LOTENSIN) 5 MG tablet; Take 1 tablet (5 mg total) by mouth daily.  Dispense: 90 tablet; Refill: 1  3. Prediabetes Comments: Stable, continues wtih Ozempic doing well - Hemoglobin A1c  4. Mixed hyperlipidemia Comments: Stable, diet controlled, continue low fat diet - Lipid panel  5. Multiple myeloma, remission status unspecified ([place]) Comments: Continue f/u with Oncology  6. Brain tumor [facility]) Comments: Has had a reoccurring area, will be receiving treatment. Encouraged to avoid being around people without a mask  7. Grief at loss of  child Comments: Daughter recently passed away, has not had service at this time. Offered counseling declined at this time     Patient was given opportunity to ask questions. Patient verbalized understanding of the plan and was able to repeat key elements of the plan. All questions were answered to their satisfaction.   Alexandra Brine, FNP    I, Alexandra Brine, FNP, have reviewed all documentation for this visit. The documentation on 07/29/21 for the exam, diagnosis, procedures, and orders are all accurate and complete.   THE PATIENT IS ENCOURAGED TO PRACTICE SOCIAL DISTANCING DUE TO THE COVID-19 PANDEMIC.

## 2021-07-29 NOTE — Patient Instructions (Signed)

## 2021-07-29 NOTE — Telephone Encounter (Signed)
.  Called pt per 7/10 inbasket , Patient was unavailable, a message with appt time and date was left with number on file.

## 2021-07-30 ENCOUNTER — Other Ambulatory Visit (HOSPITAL_COMMUNITY): Payer: Self-pay

## 2021-07-30 LAB — LIPID PANEL
Chol/HDL Ratio: 2.6 ratio (ref 0.0–4.4)
Cholesterol, Total: 149 mg/dL (ref 100–199)
HDL: 57 mg/dL (ref >39)
LDL Chol Calc (NIH): 79 mg/dL (ref 0–99)
Triglycerides: 64 mg/dL (ref 0–149)
VLDL Cholesterol Cal: 13 mg/dL (ref 5–40)

## 2021-07-30 LAB — HEMOGLOBIN A1C
Est. average glucose Bld gHb Est-mCnc: 91 mg/dL
Hgb A1c MFr Bld: 4.8 % (ref 4.8–5.6)

## 2021-07-30 LAB — MICROALBUMIN / CREATININE URINE RATIO
Creatinine, Urine: 136.9 mg/dL
Microalb/Creat Ratio: 15 mg/g creat (ref 0–29)
Microalbumin, Urine: 20.6 ug/mL

## 2021-07-31 ENCOUNTER — Telehealth: Payer: Self-pay | Admitting: Hematology

## 2021-07-31 ENCOUNTER — Other Ambulatory Visit (HOSPITAL_COMMUNITY): Payer: Self-pay

## 2021-07-31 NOTE — Telephone Encounter (Signed)
Rescheduled upcoming appointment due to increased infusion time. Patient is aware of changes.

## 2021-08-01 ENCOUNTER — Encounter: Payer: Self-pay | Admitting: General Practice

## 2021-08-01 ENCOUNTER — Inpatient Hospital Stay: Payer: 59 | Attending: Hematology

## 2021-08-01 ENCOUNTER — Other Ambulatory Visit: Payer: Self-pay

## 2021-08-01 ENCOUNTER — Inpatient Hospital Stay: Payer: 59

## 2021-08-01 VITALS — BP 117/86 | HR 66 | Temp 98.7°F | Resp 17 | Wt 163.2 lb

## 2021-08-01 DIAGNOSIS — C9001 Multiple myeloma in remission: Secondary | ICD-10-CM | POA: Diagnosis present

## 2021-08-01 DIAGNOSIS — D649 Anemia, unspecified: Secondary | ICD-10-CM

## 2021-08-01 DIAGNOSIS — C7951 Secondary malignant neoplasm of bone: Secondary | ICD-10-CM

## 2021-08-01 DIAGNOSIS — C9000 Multiple myeloma not having achieved remission: Secondary | ICD-10-CM

## 2021-08-01 DIAGNOSIS — Z5112 Encounter for antineoplastic immunotherapy: Secondary | ICD-10-CM | POA: Insufficient documentation

## 2021-08-01 DIAGNOSIS — Z7189 Other specified counseling: Secondary | ICD-10-CM

## 2021-08-01 LAB — CMP (CANCER CENTER ONLY)
ALT: 10 U/L (ref 0–44)
AST: 11 U/L — ABNORMAL LOW (ref 15–41)
Albumin: 4.2 g/dL (ref 3.5–5.0)
Alkaline Phosphatase: 39 U/L (ref 38–126)
Anion gap: 6 (ref 5–15)
BUN: 10 mg/dL (ref 6–20)
CO2: 28 mmol/L (ref 22–32)
Calcium: 9.7 mg/dL (ref 8.9–10.3)
Chloride: 108 mmol/L (ref 98–111)
Creatinine: 0.87 mg/dL (ref 0.44–1.00)
GFR, Estimated: >60 mL/min (ref >60)
Glucose, Bld: 113 mg/dL — ABNORMAL HIGH (ref 70–99)
Potassium: 3.8 mmol/L (ref 3.5–5.1)
Sodium: 142 mmol/L (ref 135–145)
Total Bilirubin: 0.7 mg/dL (ref 0.3–1.2)
Total Protein: 6.4 g/dL — ABNORMAL LOW (ref 6.5–8.1)

## 2021-08-01 LAB — CBC WITH DIFFERENTIAL (CANCER CENTER ONLY)
Abs Immature Granulocytes: 0 10*3/uL (ref 0.00–0.07)
Basophils Absolute: 0 10*3/uL (ref 0.0–0.1)
Basophils Relative: 1 %
Eosinophils Absolute: 0.1 10*3/uL (ref 0.0–0.5)
Eosinophils Relative: 2 %
HCT: 34.1 % — ABNORMAL LOW (ref 36.0–46.0)
Hemoglobin: 12.1 g/dL (ref 12.0–15.0)
Immature Granulocytes: 0 %
Lymphocytes Relative: 38 %
Lymphs Abs: 1.1 10*3/uL (ref 0.7–4.0)
MCH: 32.4 pg (ref 26.0–34.0)
MCHC: 35.5 g/dL (ref 30.0–36.0)
MCV: 91.2 fL (ref 80.0–100.0)
Monocytes Absolute: 0.3 10*3/uL (ref 0.1–1.0)
Monocytes Relative: 11 %
Neutro Abs: 1.4 10*3/uL — ABNORMAL LOW (ref 1.7–7.7)
Neutrophils Relative %: 48 %
Platelet Count: 284 10*3/uL (ref 150–400)
RBC: 3.74 MIL/uL — ABNORMAL LOW (ref 3.87–5.11)
RDW: 16.7 % — ABNORMAL HIGH (ref 11.5–15.5)
WBC Count: 3 10*3/uL — ABNORMAL LOW (ref 4.0–10.5)
nRBC: 0 % (ref 0.0–0.2)

## 2021-08-01 MED ORDER — MONTELUKAST SODIUM 10 MG PO TABS
10.0000 mg | ORAL_TABLET | Freq: Once | ORAL | Status: AC
  Administered 2021-08-01: 10 mg via ORAL
  Filled 2021-08-01: qty 1

## 2021-08-01 MED ORDER — SODIUM CHLORIDE 0.9 % IV SOLN
20.0000 mg | Freq: Once | INTRAVENOUS | Status: AC
  Administered 2021-08-01: 20 mg via INTRAVENOUS
  Filled 2021-08-01: qty 20

## 2021-08-01 MED ORDER — FAMOTIDINE IN NACL 20-0.9 MG/50ML-% IV SOLN
20.0000 mg | Freq: Once | INTRAVENOUS | Status: AC
  Administered 2021-08-01: 20 mg via INTRAVENOUS
  Filled 2021-08-01: qty 50

## 2021-08-01 MED ORDER — DIPHENHYDRAMINE HCL 50 MG/ML IJ SOLN
50.0000 mg | Freq: Once | INTRAMUSCULAR | Status: AC
  Administered 2021-08-01: 50 mg via INTRAVENOUS
  Filled 2021-08-01: qty 1

## 2021-08-01 MED ORDER — ACETAMINOPHEN 325 MG PO TABS
650.0000 mg | ORAL_TABLET | Freq: Once | ORAL | Status: AC
  Administered 2021-08-01: 650 mg via ORAL
  Filled 2021-08-01: qty 2

## 2021-08-01 MED ORDER — SODIUM CHLORIDE 0.9 % IV SOLN
10.0000 mg/kg | Freq: Once | INTRAVENOUS | Status: AC
  Administered 2021-08-01: 700 mg via INTRAVENOUS
  Filled 2021-08-01: qty 25

## 2021-08-01 MED ORDER — SODIUM CHLORIDE 0.9 % IV SOLN: Freq: Once | INTRAVENOUS | Status: AC

## 2021-08-01 MED ORDER — SODIUM CHLORIDE 0.9 % IV SOLN
20.0000 mg | Freq: Once | INTRAVENOUS | Status: DC
  Filled 2021-08-01: qty 2

## 2021-08-01 NOTE — Progress Notes (Signed)
Per Dr Lorenso Courier, ok to treat with ANC of 1.4 K/uL today

## 2021-08-01 NOTE — Progress Notes (Signed)
[facility] Spiritual Care Note  Referred by nursing for bereavement support, as Ms Cassetta' daughter just died by suicide. Visited in infusion, bringing handmade blessing blanket as a tangible gesture of comfort and introducing Spiritual Care as part of her support team. We plan to follow up in more detail by phone next week per her request.      Chaplain Lorrin Jackson, [place], [facility] Pager [phone] Voicemail [phone]

## 2021-08-01 NOTE — Patient Instructions (Signed)
[facility] ONCOLOG[facility]  Discharge Instructions: Thank you for choosing [facility] to provide your oncology and hematology care.   If you have a lab appointment with the [facility], please go directly to the [facility] and check in at the registration area.   Wear comfortable clothing and clothing appropriate for easy access to any Portacath or PICC line.   We strive to give you quality time with your provider. You may need to reschedule your appointment if you arrive late (15 or more minutes).  Arriving late affects you and other patients whose appointments are after yours.  Also, if you miss three or more appointments without notifying the office, you may be dismissed from the clinic at the provider's discretion.      For prescription refill requests, have your pharmacy contact our office and allow 72 hours for refills to be completed.    Today you received the following chemotherapy and/or immunotherapy agent: Sarclisa      To help prevent nausea and vomiting after your treatment, we encourage you to take your nausea medication as directed.  BELOW ARE SYMPTOMS THAT SHOULD BE REPORTED IMMEDIATELY: *FEVER GREATER THAN 100.4 F (38 C) OR HIGHER *CHILLS OR SWEATING *NAUSEA AND VOMITING THAT IS NOT CONTROLLED WITH YOUR NAUSEA MEDICATION *UNUSUAL SHORTNESS OF BREATH *UNUSUAL BRUISING OR BLEEDING *URINARY PROBLEMS (pain or burning when urinating, or frequent urination) *BOWEL PROBLEMS (unusual diarrhea, constipation, pain near the anus) TENDERNESS IN MOUTH AND THROAT WITH OR WITHOUT PRESENCE OF ULCERS (sore throat, sores in mouth, or a toothache) UNUSUAL RASH, SWELLING OR PAIN  UNUSUAL VAGINAL DISCHARGE OR ITCHING   Items with * indicate a potential emergency and should be followed up as soon as possible or go to the Emergency Department if any problems should occur.  Please show the CHEMOTHERAPY ALERT CARD or IMMUNOTHERAPY ALERT CARD at check-in to  the Emergency Department and triage nurse.  Should you have questions after your visit or need to cancel or reschedule your appointment, please contact [facility]  Dept: [phone]  and follow the prompts.  Office hours are 8:00 a.m. to 4:30 p.m. Monday - Friday. Please note that voicemails left after 4:00 p.m. may not be returned until the following business day.  We are closed weekends and major holidays. You have access to a nurse at all times for urgent questions. Please call the main number to the clinic Dept: [phone] and follow the prompts.   For any non-urgent questions, you may also contact your provider using MyChart. We now offer e-Visits for anyone 57 and older to request care online for non-urgent symptoms. For details visit [email or web address].   Also download the MyChart app! Go to the app store, search "MyChart", open the app, select [facility], and log in with your MyChart username and password.  Masks are optional in the cancer centers. If you would like for your care team to wear a mask while they are taking care of you, please let them know. For doctor visits, patients may have with them one support person who is at least 58 years old. At this time, visitors are not allowed in the infusion area.

## 2021-08-05 ENCOUNTER — Encounter: Payer: Self-pay | Admitting: General Practice

## 2021-08-05 ENCOUNTER — Other Ambulatory Visit: Payer: Self-pay | Admitting: Internal Medicine

## 2021-08-05 DIAGNOSIS — C9002 Multiple myeloma in relapse: Secondary | ICD-10-CM

## 2021-08-05 NOTE — Progress Notes (Signed)
[facility] Spiritual Care Note  Followed up by phone, leaving voicemail with encouragement to return call when it would be helpful to [facility].   [place], [place], [facility] Pager [phone] Voicemail [phone]

## 2021-08-06 ENCOUNTER — Encounter: Payer: Self-pay | Admitting: Urology

## 2021-08-06 ENCOUNTER — Other Ambulatory Visit: Payer: Self-pay

## 2021-08-06 ENCOUNTER — Ambulatory Visit (HOSPITAL_COMMUNITY)
Admission: RE | Admit: 2021-08-06 | Discharge: 2021-08-06 | Disposition: A | Discharge status: Home / Self Care | Payer: 59 | Source: Ambulatory Visit | Attending: Hematology | Admitting: Hematology

## 2021-08-06 ENCOUNTER — Other Ambulatory Visit: Payer: Self-pay | Admitting: Nurse Practitioner

## 2021-08-06 DIAGNOSIS — C9000 Multiple myeloma not having achieved remission: Secondary | ICD-10-CM

## 2021-08-06 DIAGNOSIS — Z9484 Stem cells transplant status: Secondary | ICD-10-CM | POA: Insufficient documentation

## 2021-08-06 DIAGNOSIS — C9002 Multiple myeloma in relapse: Secondary | ICD-10-CM

## 2021-08-06 HISTORY — PX: IR IMAGING GUIDED PORT INSERTION: IMG5740

## 2021-08-06 MED ORDER — DIPHENHYDRAMINE HCL 50 MG/ML IJ SOLN
INTRAMUSCULAR | Status: AC
  Filled 2021-08-06: qty 1

## 2021-08-06 MED ORDER — LIDOCAINE-EPINEPHRINE 1 %-1:100000 IJ SOLN
INTRAMUSCULAR | Status: AC
  Filled 2021-08-06: qty 1

## 2021-08-06 MED ORDER — LIDOCAINE HCL 1 % IJ SOLN
INTRAMUSCULAR | Status: AC
  Filled 2021-08-06: qty 20

## 2021-08-06 MED ORDER — MIDAZOLAM HCL 2 MG/2ML IJ SOLN
INTRAMUSCULAR | Status: AC | PRN
  Administered 2021-08-06 (×2): 1 mg via INTRAVENOUS

## 2021-08-06 MED ORDER — SODIUM CHLORIDE 0.9 % IV SOLN: INTRAVENOUS | Status: DC

## 2021-08-06 MED ORDER — HEPARIN SOD (PORK) LOCK FLUSH 100 UNIT/ML IV SOLN
INTRAVENOUS | Status: AC
  Filled 2021-08-06: qty 5

## 2021-08-06 MED ORDER — MIDAZOLAM HCL 2 MG/2ML IJ SOLN
INTRAMUSCULAR | Status: AC
  Filled 2021-08-06: qty 4

## 2021-08-06 MED ORDER — FENTANYL CITRATE (PF) 100 MCG/2ML IJ SOLN
INTRAMUSCULAR | Status: AC | PRN
  Administered 2021-08-06 (×2): 50 ug via INTRAVENOUS

## 2021-08-06 MED ORDER — DIPHENHYDRAMINE HCL 50 MG/ML IJ SOLN
INTRAMUSCULAR | Status: AC | PRN
  Administered 2021-08-06: 25 mg via INTRAVENOUS

## 2021-08-06 MED ORDER — FENTANYL CITRATE (PF) 100 MCG/2ML IJ SOLN
INTRAMUSCULAR | Status: AC
  Filled 2021-08-06: qty 2

## 2021-08-06 MED FILL — Levocetirizine Dihydrochloride Tab 5 MG: ORAL | 90 days supply | Qty: 90 | Fill #1 | Status: AC

## 2021-08-06 NOTE — Discharge Instructions (Signed)

## 2021-08-06 NOTE — Progress Notes (Signed)
Telephone appointment. I spoke w/ patient's son Mr. Alexandra [name], verified his identity and began nursing interview. No issues reported at this time.  Meaningful use complete.  Reminded Mr. Alexandra [name] of his mom's 9:30am-08/07/21 telephone appointment w/ Freeman Caldron PA-C. I left my extension [phone] in case patient needs anything. Understanding verbalized.  Patient contact [phone]

## 2021-08-06 NOTE — Procedures (Signed)
Interventional Radiology Procedure:   Indications: History of multiple myeloma and s/p transplant.  Procedure: Port placement  Findings: Right jugular port, tip at SVC/RA junction  Complications: None     EBL: Minimal, less than 10 ml  Plan: Discharge in one hour.  Keep port site and incisions dry for at least 24 hours.     Ellanor Feuerstein R. Anselm Pancoast, MD  Pager: [phone]

## 2021-08-06 NOTE — H&P (Signed)
Chief Complaint: Patient was seen in consultation today for multiple myeloma at the request of Brunetta Genera  Referring Physician(s): Brunetta Genera  Supervising Physician: Markus Daft  Patient Status: [facility] - Out-pt  History of Present Illness: Alexandra [name] is a 56 y.o. 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  Had previous port-a-cath placed 12/01/19 by IR.  This was removed elsewhere and exchanged for a PICC line, though patient unable to articulate why/when this was done.  She now presents to IR with request from Dr. Irene Limbo for placement of a new port.  She feels well with no physical complaints.  She mentions she is distraught due to daughter recent death and burial yesterday  Past Medical History:  Diagnosis Date   Anxiety    Hypertension    Pre-diabetes     Past Surgical History:  Procedure Laterality Date   ABLATION     APPLICATION OF CRANIAL NAVIGATION N/A 10/10/2019   Procedure: APPLICATION OF CRANIAL NAVIGATION;  Surgeon: Consuella Lose, MD;  Location: [facility];  Service: Neurosurgery;  Laterality: N/A;  APPLICATION OF CRANIAL NAVIGATION   CRANIOPLASTY Left 10/14/2019   Procedure: LEFT CRANIOPLASTY WITH PLACEMENT OF CUSTOM CRANIAL IMPLANT;  Surgeon: Consuella Lose, MD;  Location: [facility];  Service: Neurosurgery;  Laterality: Left;   CRANIOTOMY Left 10/10/2019   Procedure: Left frontal Stereotactic craniectomy for resection of tumor;  Surgeon: Consuella Lose, MD;  Location: [facility];  Service: Neurosurgery;  Laterality: Left;  Left frontal Stereotactic craniectomy for resection of tumor   HAMMER TOE SURGERY Left    IR IMAGING GUIDED PORT INSERTION  12/01/2019   TUBAL LIGATION      Allergies: Patient has no known allergies.  Medications: Prior to Admission medications   Medication Sig Start Date End Date Taking? Authorizing Provider  aspirin EC 81 MG tablet  Take 81 mg by mouth. Swallow whole.   Yes [provider]  atorvastatin (LIPITOR) 10 MG tablet Take 1 tablet (10 mg total) by mouth daily. 07/24/21  Yes [name], FNP  benazepril (LOTENSIN) 5 MG tablet Take 1 tablet (5 mg total) by mouth daily. 07/29/21  Yes [name], FNP  docusate sodium (COLACE) 100 MG capsule Take 100 mg by mouth 2 (two) times daily.   Yes [provider]  hydrochlorothiazide (HYDRODIURIL) 12.5 MG tablet Take 1 tablet by mouth daily as needed for leg swelling 04/01/21  Yes [name], FNP  levocetirizine (XYZAL) 5 MG tablet TAKE 1 TABLET BY MOUTH EVERY DAY IN THE EVENING 04/01/21  Yes [name], FNP  OZEMPIC, 1 MG/DOSE, 4 MG/3ML SOPN INJECT ONCE WEEKLY, SAME DAY. Patient taking differently: 1 mg. INJECT ONCE WEEKLY, SAME DAY. 03/11/21  Yes [name], FNP  POMALYST 3 MG capsule TAKE 1 CAPSULE BY MOUTH ONCE DAILY FOR 21 DAYS ON AND 7 DAYS OFF 07/12/21  Yes Brunetta Genera, MD  traZODone (DESYREL) 50 MG tablet TAKE 1 TO 2 TABLETS BY MOUTH AT BEDTIME AS NEEDED FOR SLEEP 03/20/21  Yes Brunetta Genera, MD  valACYclovir (VALTREX) 500 MG tablet Take 1 tablet by mouth 2 times a day 08/25/20  Yes      Family History  Problem Relation Age of Onset   Breast cancer Maternal Grandmother    Heart disease Mother    Hypertension Mother    Sarcoidosis Father    Diabetes Maternal Grandfather     Social History   Socioeconomic  History   Marital status: Single    Spouse name: Not on file   Number of children: Not on file   Years of education: Not on file   Highest education level: 12th grade  Occupational History   Not on file  Tobacco Use   Smoking status: Never   Smokeless tobacco: Never  Vaping Use   Vaping Use: Never used  Substance and Sexual Activity   Alcohol use: Yes    Comment: occassionally   Drug use: Not Currently   Sexual activity: Not on file  Other Topics Concern   Not on file  Social History Narrative   Not on file    Social Determinants of Health   Financial Resource Strain: Medium Risk (08/13/2020)   Overall Financial Resource Strain (CARDIA)    Difficulty of Paying Living Expenses: Somewhat hard  Food Insecurity: Food Insecurity Present (08/13/2020)   Hunger Vital Sign    Worried About Running Out of Food in the Last Year: Sometimes true    Ran Out of Food in the Last Year: Never true  Transportation Needs: Unmet Transportation Needs (08/13/2020)   PRAPARE - Transportation    Lack of Transportation (Medical): No    Lack of Transportation (Non-Medical): Yes  Physical Activity: Sufficiently Active (08/13/2020)   Exercise Vital Sign    Days of Exercise per Week: 5 days    Minutes of Exercise per Session: 30 min  Stress: No Stress Concern Present (08/13/2020)   [place]    Feeling of Stress : Only a little  Social Connections: Moderately Integrated (08/13/2020)   Social Connection and Isolation Panel [NHANES]    Frequency of Communication with Friends and Family: More than three times a week    Frequency of Social Gatherings with Friends and Family: Once a week    Attends Religious Services: More than 4 times per year    Active Member of [employer] or Organizations: Yes    Attends Archivist Meetings: 1 to 4 times per year    Marital Status: Divorced    Review of Systems: A 12 point ROS discussed and pertinent positives are indicated in the HPI above.  All other systems are negative.  Vital Signs: BP 111/80 (BP Location: Left Arm)   Pulse 61   Temp 98.4 F (36.9 C) (Oral)   Resp 15   LMP 07/09/2015   SpO2 100%   Physical Exam Constitutional:      General: She is not in acute distress.    Appearance: She is not ill-appearing.  HENT:     Head: Normocephalic and atraumatic.     Mouth/Throat:     Mouth: Mucous membranes are moist.     Pharynx: Oropharynx is clear.  Eyes:     Extraocular Movements: Extraocular  movements intact.     Conjunctiva/sclera: Conjunctivae normal.  Cardiovascular:     Rate and Rhythm: Normal rate and regular rhythm.     Pulses: Normal pulses.     Heart sounds: Normal heart sounds.  Pulmonary:     Effort: Pulmonary effort is normal.     Breath sounds: Normal breath sounds.  Abdominal:     General: Abdomen is flat.     Palpations: Abdomen is soft.  Skin:    General: Skin is warm and dry.  Neurological:     General: No focal deficit present.     Mental Status: She is alert and oriented to person, place, and time.  Psychiatric:        Mood and Affect: Mood normal.        Behavior: Behavior normal.     Imaging: No results found.  Labs:  CBC: Recent Labs    07/05/21 0900 07/12/21 1434 07/19/21 1406 08/01/21 1358  WBC 2.7* 2.1* 1.7* 3.0*  HGB 11.4* 11.7* 11.2* 12.1  HCT 33.2* 32.7* 31.4* 34.1*  PLT 170 138* 163 284    COAGS: Recent Labs    09/02/20 1735  INR 1.0  APTT 35    BMP: Recent Labs    07/05/21 0900 07/12/21 1434 07/19/21 1406 08/01/21 1358  NA 140 137 137 142  K 3.8 3.7 4.2 3.8  CL 107 103 105 108  CO2 28 27 27 28   GLUCOSE 88 126* 88 113*  BUN 19 15 16 10   CALCIUM 9.2 9.4 9.2 9.7  CREATININE 0.85 0.96 0.86 0.87  GFRNONAA >60 >60 >60 >60    LIVER FUNCTION TESTS: Recent Labs    07/05/21 0900 07/12/21 1434 07/19/21 1406 08/01/21 1358  BILITOT 0.8 1.0 0.7 0.7  AST 11* 12* 11* 11*  ALT 11 12 10 10   ALKPHOS 41 40 32* 39  PROT 6.3* 6.3* 6.0* 6.4*  ALBUMIN 4.1 4.2 3.9 4.2    Assessment and Plan:  Multiple myeloma --has not achieved remission --for Port-a-cath re-placement today in IR with anticipated d/c  later this morning.   Risks and benefits of image guided port-a-catheter placement was discussed with the patient including, but not limited to bleeding, infection, pneumothorax, or fibrin sheath development and need for additional procedures.  All of the patient's questions were answered, patient is agreeable to  proceed. Consent signed and in chart.   Thank you for this interesting consult.  I greatly enjoyed meeting Alexandra [name] and look forward to participating in their care.  A copy of this report was sent to the requesting provider on this date.  Electronically Signed: Pasty Spillers, PA 08/06/2021, 8:52 AM   I spent a total of 15 Minutes in face to face in clinical consultation, greater than 50% of which was counseling/coordinating care for port placement

## 2021-08-07 ENCOUNTER — Ambulatory Visit
Admission: RE | Admit: 2021-08-07 | Discharge: 2021-08-07 | Disposition: A | Discharge status: Home / Self Care | Payer: 59 | Source: Ambulatory Visit | Attending: Urology | Admitting: Urology

## 2021-08-07 ENCOUNTER — Other Ambulatory Visit: Payer: Self-pay | Admitting: Hematology

## 2021-08-07 ENCOUNTER — Other Ambulatory Visit (HOSPITAL_COMMUNITY): Payer: Self-pay

## 2021-08-07 DIAGNOSIS — Z7189 Other specified counseling: Secondary | ICD-10-CM

## 2021-08-07 DIAGNOSIS — C7951 Secondary malignant neoplasm of bone: Secondary | ICD-10-CM

## 2021-08-07 DIAGNOSIS — C9002 Multiple myeloma in relapse: Secondary | ICD-10-CM

## 2021-08-07 DIAGNOSIS — C9000 Multiple myeloma not having achieved remission: Secondary | ICD-10-CM

## 2021-08-07 MED ORDER — ASPIRIN 81 MG PO TBEC
81.0000 mg | DELAYED_RELEASE_TABLET | Freq: Once | ORAL | 1 refills | Status: AC
  Filled 2021-08-07: qty 90, 90d supply, fill #0
  Filled 2021-12-04: qty 90, 90d supply, fill #1

## 2021-08-07 MED ORDER — OZEMPIC (1 MG/DOSE) 4 MG/3ML ~~LOC~~ SOPN
PEN_INJECTOR | SUBCUTANEOUS | 1 refills | Status: DC
  Filled 2021-08-07: qty 9, 84d supply, fill #0
  Filled 2021-10-31: qty 3, 28d supply, fill #1
  Filled 2021-12-01: qty 3, 28d supply, fill #2
  Filled 2022-01-01: qty 3, 28d supply, fill #3

## 2021-08-07 NOTE — Progress Notes (Signed)
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   Interval Since Last Radiation:  8 weeks  06/03/21 - 06/14/21:   The recurrent tumor nodule in the left frontotemporal skull was treated to 30 Gy in 10 fractions and the cranioplasty surgical bed was simultaneously treated to 20 Gy in 10 fractions.   Narrative:  I spoke with the patient to conduct her routine scheduled 1 month follow up visit via telephone to spare the patient unnecessary potential exposure in the healthcare setting during the current COVID-19 pandemic.  The patient was notified in advance and gave permission to proceed with this visit format.  She tolerated radiation treatment relatively well without any acute ill side effects.                               On review of systems, the patient states that she is doing very well in general and is currently without complaints.  She tolerated the treatment very well with some mild alopecia as expected but otherwise denies any ill side effects associated with treatment.  She specifically denies headaches, changes in visual or auditory acuity, difficulty with speech, imbalance, tremors or seizure activity.  Unfortunately, she is still grieving the loss of her daughter, having just buried her on Monday, 08/05/2021.  She is just taking it 1 day at a time.  She also reports some new onset foot pain when weightbearing.  She denies any paresthesias or focal weakness.  ALLERGIES:  has No Known Allergies.  Meds: Current Outpatient Medications  Medication Sig Dispense Refill   aspirin EC 81 MG tablet Take 1 tablet by mouth once daily. 90 tablet 1   atorvastatin (LIPITOR) 10 MG tablet Take 1 tablet (10 mg total) by mouth  daily. 30 tablet 1   benazepril (LOTENSIN) 5 MG tablet Take 1 tablet (5 mg total) by mouth daily. 90 tablet 1   docusate sodium (COLACE) 100 MG capsule Take 100 mg by mouth 2 (two) times daily.     hydrochlorothiazide (HYDRODIURIL) 12.5 MG tablet Take 1 tablet by mouth daily as needed for leg swelling 90 tablet 1   levocetirizine (XYZAL) 5 MG tablet TAKE 1 TABLET BY MOUTH EVERY DAY IN THE EVENING 90 tablet 3   OZEMPIC, 1 MG/DOSE, 4 MG/3ML SOPN INJECT 1 MG UNDER THE SKIN ONCE WEEKLY, ON THE SAME DAY. 9 mL 1   POMALYST 3 MG capsule TAKE 1 CAPSULE BY MOUTH ONCE DAILY FOR 21 DAYS ON AND 7 DAYS OFF 21 capsule 0   traZODone (DESYREL) 50 MG tablet TAKE 1 TO 2 TABLETS BY MOUTH AT BEDTIME AS NEEDED FOR SLEEP 180 tablet 1   valACYclovir (VALTREX) 500 MG tablet Take 1 tablet by mouth 2 times a day 60 tablet 10   No current facility-administered medications for this encounter.    Physical Findings:  vitals were not taken for this visit.  Pain Assessment Pain Score: 0-No pain/10 Unable to assess due to telephone follow-up visit format.  Lab Findings: Lab Results  Component Value Date   WBC 3.0 (L) 08/01/2021   HGB 12.1 08/01/2021   HCT 34.1 (L) 08/01/2021   MCV 91.2 08/01/2021  PLT 284 08/01/2021     Radiographic Findings: IR IMAGING GUIDED PORT INSERTION  Result Date: 08/06/2021 INDICATION: 55 year old with multiple myeloma and status post transplant. Port-A-Cath needed for continued treatment. EXAM: FLUOROSCOPIC AND ULTRASOUND GUIDED PLACEMENT OF A SUBCUTANEOUS PORT COMPARISON:  None Available. MEDICATIONS: Benadryl 25 mg ANESTHESIA/SEDATION: Moderate (conscious) sedation was employed during this procedure. A total of Versed 2.0 mg and fentanyl 100 mcg was administered intravenously at the order of the provider performing the procedure. Total intra-service moderate sedation time: 24 minutes. Patient's level of consciousness and vital signs were monitored continuously by radiology nurse  throughout the procedure under the supervision of the provider performing the procedure. FLUOROSCOPY TIME:  Radiation Exposure Index (as provided by the fluoroscopic device): 1 mGy Kerma COMPLICATIONS: None immediate. PROCEDURE: The procedure, risks, benefits, and alternatives were explained to the patient. Questions regarding the procedure were encouraged and answered. The patient understands and consents to the procedure. Patient was placed supine on the interventional table. Ultrasound confirmed a patent right internal jugular vein. Ultrasound image was saved for documentation. The right chest and neck were cleaned with a skin antiseptic and a sterile drape was placed. Maximal barrier sterile technique was utilized including caps, mask, sterile gowns, sterile gloves, sterile drape, hand hygiene and skin antiseptic. The right neck was anesthetized with 1% lidocaine. Small incision was made in the right neck with a blade. Micropuncture set was placed in the right internal jugular vein with ultrasound guidance. The micropuncture wire was used for measurement purposes. The right chest was anesthetized with 1% lidocaine with epinephrine. #15 blade was used to make an incision and a subcutaneous port pocket was formed. Manning was assembled. Subcutaneous tunnel was formed with a stiff tunneling device. The port catheter was brought through the subcutaneous tunnel. The port was placed in the subcutaneous pocket. The micropuncture set was exchanged for a peel-away sheath. The catheter was placed through the peel-away sheath and the tip was positioned at the superior cavoatrial junction. Catheter placement was confirmed with fluoroscopy. The port was accessed and flushed with heparinized saline. The port pocket was closed using two layers of absorbable sutures and Dermabond. The vein skin site was closed using a single layer of absorbable suture and Dermabond. Sterile dressings were applied. Patient tolerated  the procedure well without an immediate complication. Ultrasound and fluoroscopic images were taken and saved for this procedure. IMPRESSION: Placement of a subcutaneous power-injectable port device. Catheter tip at the superior cavoatrial junction. Electronically Signed   By: Markus Daft M.D.   On: 08/06/2021 11:58    Impression/Plan: 1. 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   She appears to have recovered well from the effects of her recent radiation and is currently without complaints.  She continues to tolerate her current systemic therapy with Isatuximab and Pomalidomide.  She has developed some new onset foot pain and will discuss this further with Terisa Starr, at her upcoming visit on Friday, 08/09/2021.  Otherwise, she is quite pleased with her progress to date.  We discussed the plan to obtain a post-treatment MRI brain scan in August and pending this scan is stable, we would then continue with serial MRI brain scans every 3 months for the next year.  We will follow-up by telephone following each scan to review results and recommendations from the multidisciplinary brain conference.  She appears to have a good understanding of these recommendations and is comfortable and in agreement with  the stated plan.  She knows that she is welcome to call at anytime with any questions or concerns in the interim. -Dessiree Sze    Nicholos Johns, PA-C

## 2021-08-07 NOTE — Progress Notes (Signed)
  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  Beams/energy:   A 3D field set-up was employed with 6 MV X-rays  Narrative: The patient tolerated radiation treatment relatively well without any acute ill side effects.   Plan: The patient has completed radiation treatment. The patient will return to radiation oncology clinic for routine followup in one month. I advised her to call or return sooner if she has any questions or concerns related to her recovery or treatment. ________________________________  Sheral Apley. Tammi Klippel, M.D.

## 2021-08-08 ENCOUNTER — Other Ambulatory Visit: Payer: Self-pay

## 2021-08-08 DIAGNOSIS — C9000 Multiple myeloma not having achieved remission: Secondary | ICD-10-CM

## 2021-08-09 ENCOUNTER — Inpatient Hospital Stay: Payer: 59

## 2021-08-09 ENCOUNTER — Other Ambulatory Visit: Payer: Self-pay

## 2021-08-09 ENCOUNTER — Other Ambulatory Visit (HOSPITAL_COMMUNITY): Payer: Self-pay

## 2021-08-09 ENCOUNTER — Inpatient Hospital Stay (HOSPITAL_BASED_OUTPATIENT_CLINIC_OR_DEPARTMENT_OTHER): Payer: 59 | Admitting: Physician Assistant

## 2021-08-09 VITALS — BP 122/79 | HR 72 | Temp 97.8°F | Resp 15 | Wt 163.5 lb

## 2021-08-09 DIAGNOSIS — C9000 Multiple myeloma not having achieved remission: Secondary | ICD-10-CM

## 2021-08-09 DIAGNOSIS — Z5112 Encounter for antineoplastic immunotherapy: Secondary | ICD-10-CM | POA: Diagnosis not present

## 2021-08-09 DIAGNOSIS — Z7189 Other specified counseling: Secondary | ICD-10-CM

## 2021-08-09 DIAGNOSIS — Z95828 Presence of other vascular implants and grafts: Secondary | ICD-10-CM

## 2021-08-09 DIAGNOSIS — C7951 Secondary malignant neoplasm of bone: Secondary | ICD-10-CM

## 2021-08-09 LAB — CMP (CANCER CENTER ONLY)
ALT: 11 U/L (ref 0–44)
AST: 10 U/L — ABNORMAL LOW (ref 15–41)
Albumin: 3.9 g/dL (ref 3.5–5.0)
Alkaline Phosphatase: 41 U/L (ref 38–126)
Anion gap: 5 (ref 5–15)
BUN: 14 mg/dL (ref 6–20)
CO2: 29 mmol/L (ref 22–32)
Calcium: 9.4 mg/dL (ref 8.9–10.3)
Chloride: 106 mmol/L (ref 98–111)
Creatinine: 0.77 mg/dL (ref 0.44–1.00)
GFR, Estimated: >60 mL/min (ref >60)
Glucose, Bld: 116 mg/dL — ABNORMAL HIGH (ref 70–99)
Potassium: 4 mmol/L (ref 3.5–5.1)
Sodium: 140 mmol/L (ref 135–145)
Total Bilirubin: 0.7 mg/dL (ref 0.3–1.2)
Total Protein: 5.9 g/dL — ABNORMAL LOW (ref 6.5–8.1)

## 2021-08-09 LAB — CBC WITH DIFFERENTIAL (CANCER CENTER ONLY)
Abs Immature Granulocytes: 0.03 10*3/uL (ref 0.00–0.07)
Basophils Absolute: 0 10*3/uL (ref 0.0–0.1)
Basophils Relative: 1 %
Eosinophils Absolute: 0.2 10*3/uL (ref 0.0–0.5)
Eosinophils Relative: 4 %
HCT: 33.7 % — ABNORMAL LOW (ref 36.0–46.0)
Hemoglobin: 12 g/dL (ref 12.0–15.0)
Immature Granulocytes: 1 %
Lymphocytes Relative: 23 %
Lymphs Abs: 0.8 10*3/uL (ref 0.7–4.0)
MCH: 32.3 pg (ref 26.0–34.0)
MCHC: 35.6 g/dL (ref 30.0–36.0)
MCV: 90.6 fL (ref 80.0–100.0)
Monocytes Absolute: 0.3 10*3/uL (ref 0.1–1.0)
Monocytes Relative: 10 %
Neutro Abs: 2.2 10*3/uL (ref 1.7–7.7)
Neutrophils Relative %: 61 %
Platelet Count: 206 10*3/uL (ref 150–400)
RBC: 3.72 MIL/uL — ABNORMAL LOW (ref 3.87–5.11)
RDW: 16.2 % — ABNORMAL HIGH (ref 11.5–15.5)
WBC Count: 3.6 10*3/uL — ABNORMAL LOW (ref 4.0–10.5)
nRBC: 0 % (ref 0.0–0.2)

## 2021-08-09 MED ORDER — ACETAMINOPHEN 325 MG PO TABS
650.0000 mg | ORAL_TABLET | Freq: Once | ORAL | Status: AC
  Administered 2021-08-09: 650 mg via ORAL
  Filled 2021-08-09: qty 2

## 2021-08-09 MED ORDER — SODIUM CHLORIDE 0.9 % IV SOLN
10.0000 mg/kg | Freq: Once | INTRAVENOUS | Status: AC
  Administered 2021-08-09: 700 mg via INTRAVENOUS
  Filled 2021-08-09: qty 25

## 2021-08-09 MED ORDER — MONTELUKAST SODIUM 10 MG PO TABS
10.0000 mg | ORAL_TABLET | Freq: Once | ORAL | Status: AC
  Administered 2021-08-09: 10 mg via ORAL
  Filled 2021-08-09: qty 1

## 2021-08-09 MED ORDER — SODIUM CHLORIDE 0.9 % IV SOLN: Freq: Once | INTRAVENOUS | Status: AC

## 2021-08-09 MED ORDER — SODIUM CHLORIDE 0.9 % IV SOLN
20.0000 mg | Freq: Once | INTRAVENOUS | Status: AC
  Administered 2021-08-09: 20 mg via INTRAVENOUS
  Filled 2021-08-09: qty 20

## 2021-08-09 MED ORDER — FAMOTIDINE IN NACL 20-0.9 MG/50ML-% IV SOLN
20.0000 mg | Freq: Once | INTRAVENOUS | Status: AC
  Administered 2021-08-09: 20 mg via INTRAVENOUS
  Filled 2021-08-09: qty 50

## 2021-08-09 MED ORDER — DIPHENHYDRAMINE HCL 50 MG/ML IJ SOLN
50.0000 mg | Freq: Once | INTRAMUSCULAR | Status: AC
  Administered 2021-08-09: 50 mg via INTRAVENOUS
  Filled 2021-08-09: qty 1

## 2021-08-09 MED ORDER — SODIUM CHLORIDE 0.9% FLUSH
10.0000 mL | INTRAVENOUS | Status: DC | PRN
  Administered 2021-08-09: 10 mL

## 2021-08-09 MED ORDER — HEPARIN SOD (PORK) LOCK FLUSH 100 UNIT/ML IV SOLN
500.0000 [IU] | Freq: Once | INTRAVENOUS | Status: AC | PRN
  Administered 2021-08-09: 500 [IU]

## 2021-08-09 MED ORDER — SODIUM CHLORIDE 0.9% FLUSH
10.0000 mL | Freq: Once | INTRAVENOUS | Status: AC
  Administered 2021-08-09: 10 mL

## 2021-08-09 NOTE — Patient Instructions (Signed)
[facility] ONCOLOGY   Discharge Instructions: Thank you for choosing [facility] to provide your oncology and hematology care.   If you have a lab appointment with the [facility], please go directly to the [facility] and check in at the registration area.   Wear comfortable clothing and clothing appropriate for easy access to any Portacath or PICC line.   We strive to give you quality time with your provider. You may need to reschedule your appointment if you arrive late (15 or more minutes).  Arriving late affects you and other patients whose appointments are after yours.  Also, if you miss three or more appointments without notifying the office, you may be dismissed from the clinic at the provider's discretion.      For prescription refill requests, have your pharmacy contact our office and allow 72 hours for refills to be completed.    Today you received the following chemotherapy and/or immunotherapy agents: isatuximab-irfc      To help prevent nausea and vomiting after your treatment, we encourage you to take your nausea medication as directed.  BELOW ARE SYMPTOMS THAT SHOULD BE REPORTED IMMEDIATELY: *FEVER GREATER THAN 100.4 F (38 C) OR HIGHER *CHILLS OR SWEATING *NAUSEA AND VOMITING THAT IS NOT CONTROLLED WITH YOUR NAUSEA MEDICATION *UNUSUAL SHORTNESS OF BREATH *UNUSUAL BRUISING OR BLEEDING *URINARY PROBLEMS (pain or burning when urinating, or frequent urination) *BOWEL PROBLEMS (unusual diarrhea, constipation, pain near the anus) TENDERNESS IN MOUTH AND THROAT WITH OR WITHOUT PRESENCE OF ULCERS (sore throat, sores in mouth, or a toothache) UNUSUAL RASH, SWELLING OR PAIN  UNUSUAL VAGINAL DISCHARGE OR ITCHING   Items with * indicate a potential emergency and should be followed up as soon as possible or go to the Emergency Department if any problems should occur.  Please show the CHEMOTHERAPY ALERT CARD or IMMUNOTHERAPY ALERT CARD at  check-in to the Emergency Department and triage nurse.  Should you have questions after your visit or need to cancel or reschedule your appointment, please contact [facility]  Dept: [phone]  and follow the prompts.  Office hours are 8:00 a.m. to 4:30 p.m. Monday - Friday. Please note that voicemails left after 4:00 p.m. may not be returned until the following business day.  We are closed weekends and major holidays. You have access to a nurse at all times for urgent questions. Please call the main number to the clinic Dept: [phone] and follow the prompts.   For any non-urgent questions, you may also contact your provider using MyChart. We now offer e-Visits for anyone 58 and older to request care online for non-urgent symptoms. For details visit [email or web address].   Also download the MyChart app! Go to the app store, search "MyChart", open the app, select [facility], and log in with your MyChart username and password.  Masks are optional in the cancer centers. If you would like for your care team to wear a mask while they are taking care of you, please let them know. For doctor visits, patients may have with them one support person who is at least 58 years old. At this time, visitors are not allowed in the infusion area.

## 2021-08-12 ENCOUNTER — Other Ambulatory Visit: Payer: Self-pay

## 2021-08-12 ENCOUNTER — Encounter: Payer: Self-pay | Admitting: Hematology

## 2021-08-12 LAB — KAPPA/LAMBDA LIGHT CHAINS
Kappa free light chain: 11.2 mg/L (ref 3.3–19.4)
Kappa, lambda light chain ratio: 0.97 (ref 0.26–1.65)
Lambda free light chains: 11.5 mg/L (ref 5.7–26.3)

## 2021-08-12 NOTE — Progress Notes (Signed)
HEMATOLOGY/ONCOLOGY CLINIC NOTE  Date of Service: .08/09/2021  Patient Care Team: [name], FNP as PCP - General (General Practice)  CHIEF COMPLAINTS/PURPOSE OF CONSULTATION:  Follow-up for continued evaluation and management of multiple myeloma and toxicity check for a Isatuxmab plus pomalidomide  ONCOLOGIC HISTORY #Lambda Light Chain Multiple Myeloma  A. 09/27/19: CT Maxillofacial enhancing dural-based anterior cranial fossa mass traversing the calvarium and extending into the extracalvarial soft tissues along with a 7.4 mm rightward midline shift at the level of anterior cranial fossa. MRI head with and without contrast is recommended for further evaluation. B. 10/05/19: MRI Head large extra-axial mass anterior left frontal region with bone erosion and extension into the scalp soft tissues. Considerations include hemangiopericytoma, metastasis, and aggressive hemangioma. 2.6 mm enhancing lesion in the clivus.  C. 10/10/19: Left craniectomy with frontal brain tumor/bone resection, path + plasma cell neoplasm, lambda restricted, plasmacytoma favored. Hgb 9.9,  D. 10/31/19: Initial Hem-Onc consultation; M-spike 0; IgG 627, IgQ 51, IgM 11; SFLC: Kappa 1.12 mg/dL, Lambda 154.50, ratio 0.01; IFE: serum + monoclonal free Lambda light chain; Hgb 10.1,  E. 11/04/19: 24 hour UPEP Total protein 1318, Free lambda light chains Ur 1332.46, ratio 0.02, M-spike 27.4, M-spike 24 hr at 361; Bence Jones positive, Lambda type F. 11/08/19: BMBx 22% atypical plasma cells, 30% by IHC; FISH Dup(1q) and Del(13q) detected G. 11/10/19: PET CT no metabolic activity within the skeleton to localize active myeloma; no lytic lesions identified; no abnormal activity at the left craniectomy site; no evidence of soft tissue plasmacytoma. H. 12/05/19: Started KRD; SFLC: Kappa 1.42, Lambda 177.97, ratio 0.01; Hgb 11.0 I. 12//13/21: SFLC: Kappa 1.45, Lambda 8.92, ratio 0.16; Hgb 11.1 J. 02/06/20: SFLC: Kappa 1.10, Lambda  2.08, ratio 0.53; Hgb 11.3 K. 02/26/20: Cycle 4 of KRD; SFLC: Kappa 1.14, Lambda 1.90, ratio 0.60; Hgb 10.8 L. 04/02/20: SFLC: Kappa 1.05, Lambda 1.56, ratio 0.67; Hgb 11.2 M. 04/30/20: SFLC: Kappa 1.01, Lambda 1.40, ratio 0.72; Hgb 11.5  N. 05/05/20: MRI Brain no evidence of left frontal bone lesion recurrence; Multiple subcentimeter area of nodular enhancement in the calvarium are nonspecific but could related to myeloma given nonvisualization on prior. O. 05/11/20: PET CT no findings to suggest FDG avid lesions of multiple myeloma. P. 05/14/20: BMBx 1% plasma cells Q. 05/28/20: SFLC: Kappa 0.84, Lambda 1.16, ratio 0.72; Hgb 11.6  R. 06/11/20: EKG QT 408/QTc 424; PFT's: FEV1 110%, DLCO 58.8% predicted, 64.4% hgb corrected; ECHO: EF 55% est, 61% calc; GLS -20%; SFLC: Kappa 1.16, Lambda 1.40, ratio 0.83; Hgb 10.9-VGPR S. 07/06/20: SFLC: Kappa 1.16, Lambda 2.14, ratio 0.54; . Hgb 11.7, Plt 118 T. 6/20-7/12/22: Hospital admission for Melphalan 200 mg/m2 followed by reinfusion of 4.71 x 10^6/kg CD34+ autologous cells   INTERVAL HISTORY: [name] is here for continued evaluation and management of her multiple myeloma and for toxicity check for Isatuxmab plus pomalidomide. [name] reports that her energy levels are fairly stable and she is able to complete her daily activities on her own. She denies any appetite changes or weight loss. She denies nausea, vomiting or abdominal pain. Her bowel habits are unchanged without any recurrent episodes of diarrhea or constipation. She denies easy bruising or signs of bleeding. She experienced tingling in her left foot for a couple of days that resolved. She denies fevers, chills, sweats, shortness of breath, chest pain, cough, rash, dizziness or headaches. She has no other complaints.    MEDICAL HISTORY:  Past Medical History:  Diagnosis Date   Anxiety  Hypertension    Pre-diabetes     SURGICAL HISTORY: Past Surgical History:  Procedure Laterality Date    ABLATION     APPLICATION OF CRANIAL NAVIGATION N/A 10/10/2019   Procedure: APPLICATION OF CRANIAL NAVIGATION;  Surgeon: Consuella Lose, MD;  Location: [place];  Service: Neurosurgery;  Laterality: N/A;  APPLICATION OF CRANIAL NAVIGATION   CRANIOPLASTY Left 10/14/2019   Procedure: LEFT CRANIOPLASTY WITH PLACEMENT OF CUSTOM CRANIAL IMPLANT;  Surgeon: Consuella Lose, MD;  Location: [place];  Service: Neurosurgery;  Laterality: Left;   CRANIOTOMY Left 10/10/2019   Procedure: Left frontal Stereotactic craniectomy for resection of tumor;  Surgeon: Consuella Lose, MD;  Location: [place];  Service: Neurosurgery;  Laterality: Left;  Left frontal Stereotactic craniectomy for resection of tumor   HAMMER TOE SURGERY Left    IR IMAGING GUIDED PORT INSERTION  12/01/2019   IR IMAGING GUIDED PORT INSERTION  08/06/2021   TUBAL LIGATION      SOCIAL HISTORY: Social History   Socioeconomic History   Marital status: Single    Spouse name: Not on file   Number of children: Not on file   Years of education: Not on file   Highest education level: 12th grade  Occupational History   Not on file  Tobacco Use   Smoking status: Never   Smokeless tobacco: Never  Vaping Use   Vaping Use: Never used  Substance and Sexual Activity   Alcohol use: Yes    Comment: occassionally   Drug use: Not Currently   Sexual activity: Not on file  Other Topics Concern   Not on file  Social History Narrative   Not on file   Social Determinants of Health   Financial Resource Strain: Medium Risk (08/13/2020)   Overall Financial Resource Strain (CARDIA)    Difficulty of Paying Living Expenses: Somewhat hard  Food Insecurity: Food Insecurity Present (08/13/2020)   Hunger Vital Sign    Worried About Running Out of Food in the Last Year: Sometimes true    Ran Out of Food in the Last Year: Never true  Transportation Needs: Unmet Transportation Needs (08/13/2020)   PRAPARE - Transportation    Lack of Transportation  (Medical): No    Lack of Transportation (Non-Medical): Yes  Physical Activity: Sufficiently Active (08/13/2020)   Exercise Vital Sign    Days of Exercise per Week: 5 days    Minutes of Exercise per Session: 30 min  Stress: No Stress Concern Present (08/13/2020)   [employer] [place]    Feeling of Stress : Only a little  Social Connections: Moderately Integrated (08/13/2020)   Social Connection and Isolation Panel [NHANES]    Frequency of Communication with Friends and Family: More than three times a week    Frequency of Social Gatherings with Friends and Family: Once a week    Attends Religious Services: More than 4 times per year    Active Member of [employer] or Organizations: Yes    Attends Archivist Meetings: 1 to 4 times per year    Marital Status: Divorced  Human resources officer Violence: Not on file    FAMILY HISTORY: Family History  Problem Relation Age of Onset   Breast cancer Maternal Grandmother    Heart disease Mother    Hypertension Mother    Sarcoidosis Father    Diabetes Maternal Grandfather     ALLERGIES:  has No Known Allergies.  MEDICATIONS:  Current Outpatient Medications  Medication Sig Dispense Refill  atorvastatin (LIPITOR) 10 MG tablet Take 1 tablet (10 mg total) by mouth daily. 30 tablet 1   benazepril (LOTENSIN) 5 MG tablet Take 1 tablet (5 mg total) by mouth daily. 90 tablet 1   docusate sodium (COLACE) 100 MG capsule Take 100 mg by mouth 2 (two) times daily.     hydrochlorothiazide (HYDRODIURIL) 12.5 MG tablet Take 1 tablet by mouth daily as needed for leg swelling 90 tablet 1   levocetirizine (XYZAL) 5 MG tablet TAKE 1 TABLET BY MOUTH EVERY DAY IN THE EVENING 90 tablet 3   OZEMPIC, 1 MG/DOSE, 4 MG/3ML SOPN INJECT 1 MG UNDER THE SKIN ONCE WEEKLY, ON THE SAME DAY. 9 mL 1   POMALYST 3 MG capsule TAKE 1 CAPSULE BY MOUTH ONCE DAILY FOR 21 DAYS ON AND 7 DAYS OFF 21 capsule 0   traZODone (DESYREL)  50 MG tablet TAKE 1 TO 2 TABLETS BY MOUTH AT BEDTIME AS NEEDED FOR SLEEP 180 tablet 1   valACYclovir (VALTREX) 500 MG tablet Take 1 tablet by mouth 2 times a day 60 tablet 10   No current facility-administered medications for this visit.    REVIEW OF SYSTEMS:   .10 Point review of Systems was done is negative except as noted above.   PHYSICAL EXAMINATION: ECOG PERFORMANCE STATUS: 1 - Symptomatic but completely ambulatory . Marland Kitchen GENERAL:alert, in no acute distress and comfortable SKIN: no acute rashes, no significant lesions EYES: conjunctiva are pink and non-injected, sclera anicteric OROPHARYNX: MMM, no exudates, no oropharyngeal erythema or ulceration NECK: supple, no JVD LYMPH:  no palpable lymphadenopathy in the cervical or inguinal regions LUNGS: clear to auscultation b/l with normal respiratory effort HEART: regular rate & rhythm Extremity: no pedal edema PSYCH: alert & oriented x 3 with fluent speech NEURO: no focal motor/sensory deficits  LABORATORY DATA:  I have reviewed the data as listed     Latest Ref Rng & Units 08/09/2021    1:14 PM 08/01/2021    1:58 PM 07/19/2021    2:06 PM  CBC  WBC 4.0 - 10.5 K/uL 3.6  3.0  1.7   Hemoglobin 12.0 - 15.0 g/dL 12.0  12.1  11.2   Hematocrit 36.0 - 46.0 % 33.7  34.1  31.4   Platelets 150 - 400 K/uL 206  284  163        Latest Ref Rng & Units 08/09/2021    1:14 PM 08/01/2021    1:58 PM 07/19/2021    2:06 PM  CMP  Glucose 70 - 99 mg/dL 116  113  88   BUN 6 - 20 mg/dL _0 Creatinine 0.44 - 1.00 mg/dL 0.77  0.87  0.86   Sodium 135 - 145 mmol/L 140  142  137   Potassium 3.5 - 5.1 mmol/L 4.0  3.8  4.2   Chloride 98 - 111 mmol/L 106  108  105   CO2 22 - 32 mmol/L _1 Calcium 8.9 - 10.3 mg/dL 9.4  9.7  9.2   Total Protein 6.5 - 8.1 g/dL 5.9  6.4  6.0   Total Bilirubin 0.3 - 1.2 mg/dL 0.7  0.7  0.7   Alkaline Phos 38 - 126 U/L 41  39  32   AST 15 - 41 U/L _2 ALT 0 - 44 U/L _3 11/08/2019  FISH Plasma Cell Myeloma Prognostic Panel:  11/08/2019 Cytogenetics:    11/08/2019 Bone Marrow Report [phone]):   10/10/2019 Surgical Pathology [id number]):   05/14/2020 Surgical Pathology "-Normocellular bone marrow for age with trilineage hematopoiesis and 1%  plasma cells  -See comment   PERIPHERAL BLOOD:  -Normocytic-normochromic anemia  -Leukopenia   COMMENT:   The bone marrow is generally normocellular for age with trilineage  hematopoiesis but with predominance of erythroid precursors.  The  myeloid changes are not considered specific and likely related to  therapy.  In this background, the plasma cells represent 1% of all cells  associated with scattered small clusters and display slight lambda light  chain excess.  The findings are limited but most suggestive of minimal  residual plasma cell neoplasm.  Correlation with cytogenetic and FISH  studies is recommended. "  RADIOGRAPHIC STUDIES:  I have personally reviewed the radiological images as listed and agreed with the findings in the report. IR IMAGING GUIDED PORT INSERTION  Result Date: 08/06/2021 INDICATION: 56 year old with multiple myeloma and status post transplant. Port-A-Cath needed for continued treatment. EXAM: FLUOROSCOPIC AND ULTRASOUND GUIDED PLACEMENT OF A SUBCUTANEOUS PORT COMPARISON:  None Available. MEDICATIONS: Benadryl 25 mg ANESTHESIA/SEDATION: Moderate (conscious) sedation was employed during this procedure. A total of Versed 2.0 mg and fentanyl 100 mcg was administered intravenously at the order of the provider performing the procedure. Total intra-service moderate sedation time: 24 minutes. Patient's level of consciousness and vital signs were monitored continuously by radiology nurse throughout the procedure under the supervision of the provider performing the procedure. FLUOROSCOPY TIME:  Radiation Exposure Index (as provided by the fluoroscopic device): 1 mGy Kerma COMPLICATIONS:  None immediate. PROCEDURE: The procedure, risks, benefits, and alternatives were explained to the patient. Questions regarding the procedure were encouraged and answered. The patient understands and consents to the procedure. Patient was placed supine on the interventional table. Ultrasound confirmed a patent right internal jugular vein. Ultrasound image was saved for documentation. The right chest and neck were cleaned with a skin antiseptic and a sterile drape was placed. Maximal barrier sterile technique was utilized including caps, mask, sterile gowns, sterile gloves, sterile drape, hand hygiene and skin antiseptic. The right neck was anesthetized with 1% lidocaine. Small incision was made in the right neck with a blade. Micropuncture set was placed in the right internal jugular vein with ultrasound guidance. The micropuncture wire was used for measurement purposes. The right chest was anesthetized with 1% lidocaine with epinephrine. #15 blade was used to make an incision and a subcutaneous port pocket was formed. [place] was assembled. Subcutaneous tunnel was formed with a stiff tunneling device. The port catheter was brought through the subcutaneous tunnel. The port was placed in the subcutaneous pocket. The micropuncture set was exchanged for a peel-away sheath. The catheter was placed through the peel-away sheath and the tip was positioned at the superior cavoatrial junction. Catheter placement was confirmed with fluoroscopy. The port was accessed and flushed with heparinized saline. The port pocket was closed using two layers of absorbable sutures and Dermabond. The vein skin site was closed using a single layer of absorbable suture and Dermabond. Sterile dressings were applied. Patient tolerated the procedure well without an immediate complication. Ultrasound and fluoroscopic images were taken and saved for this procedure. IMPRESSION: Placement of a subcutaneous power-injectable port device.  Catheter tip at the superior cavoatrial junction. Electronically Signed   By: Markus Daft M.D.   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 No M spike. Lambda light chain myeloma + Anemia + bone lesion on skull-- resected No renal failure, hypercalcemia  BMBx- 22-30% lambda restricted plasma cell MolCy Dup (1q), del (13q)  Completed 7 cycles of KRD  The pt recently had a transplant [facility]. DISEASE Lambda Light Chain Multiple Myeloma  - She will receive Melphalan 200 mg/m2 conditioning regimen, followed by autologous stem cell rescue (see details below).  TRANSPLANT: Preparative regimen:  - Melphalan 200 mg/m2 (314m) IV: 07/09/20  Stem Cells:  - Autologous stem cell reinfusion on 07/10/20. Patient received 4.71 x 10^6 CD34+/kg. - Her course was complicated by neutropenic fevers and Bcx 6/27 positive for E coli. She received IV cefepime from 6/27 until 6/30, which cefepime was switched to zosyn in the setting of new abdominal pain. CT abdomen/pelvis was performed that showed acute uncomplicated multifocal diverticulitis involving the transverse and descending colon. She was treated with bowel rest, prn pain medication, and IV zosyn, which was continued until engraftment on 7/5 and was transitioned to ppx ceftriaxone. Unfortunately, with the transition to ceftriaxone she had recurrent fevers and as a result was restarted on zosyn. Zosyn was transitioned to oral cipro/flagyl on 7/10 with the plan to continue for a 7 day course.  -Bone marrow biopsy done on 01/18/2021 showed normocellular bone marrow with no morphologic evidence of plasma cell neoplasm. -24-hour UPEP on 01/22/2021 showed unremarkable  immunofixation pattern and no monoclonal protein. - CT scan 01/16/2021-negative for FDG avid lesions of myeloma -MRI brain 01/12/2021-showed no overt active myeloma lesions.  PLAN: -Labs from today were reviewed and adequate for treatment. Wbc 3.6, Hgb 12.0, Plt 206K, ANC 2.2. CMP shows normal creatinine and calcium levels.  -Recommend to continue with current treatment with Isatuximab and Pomalidomide. -Will order post transplant vaccination -Last Zometa was on 05/30/2021, next one due in August 2023.    FOLLOW UP: RTC on 08/23/2021 with labs and follow up visit with Dr. KIrene Limbobefore Cycle 2, Day 15 of Isatuximab.   All of the patient's questions were answered with apparent satisfaction. The patient knows to call the clinic with any problems, questions or concerns.  I have spent a total of 30 minutes minutes of face-to-face and non-face-to-face time, preparing to see the patient, performing a medically appropriate examination, counseling and educating the patient, documenting clinical information in the electronic health record, and care coordination.   IDede QueryPA-C Dept of Hematology and OBeverly Hillsat WChalmers P. Wylie Va Ambulatory Care CenterPhone: [phone]

## 2021-08-14 LAB — MULTIPLE MYELOMA PANEL, SERUM
Albumin SerPl Elph-Mcnc: 3.3 g/dL (ref 2.9–4.4)
Albumin/Glob SerPl: 1.6 (ref 0.7–1.7)
Alpha 1: 0.2 g/dL (ref 0.0–0.4)
Alpha2 Glob SerPl Elph-Mcnc: 0.6 g/dL (ref 0.4–1.0)
B-Globulin SerPl Elph-Mcnc: 0.8 g/dL (ref 0.7–1.3)
Gamma Glob SerPl Elph-Mcnc: 0.5 g/dL (ref 0.4–1.8)
Globulin, Total: 2.2 g/dL (ref 2.2–3.9)
IgA: 32 mg/dL — ABNORMAL LOW (ref 87–352)
IgG (Immunoglobin G), Serum: 493 mg/dL — ABNORMAL LOW (ref 586–1602)
IgM (Immunoglobulin M), Srm: 6 mg/dL — ABNORMAL LOW (ref 26–217)
M Protein SerPl Elph-Mcnc: 0.1 g/dL — ABNORMAL HIGH
Total Protein ELP: 5.5 g/dL — ABNORMAL LOW (ref 6.0–8.5)

## 2021-08-15 ENCOUNTER — Telehealth: Payer: Self-pay

## 2021-08-15 NOTE — Telephone Encounter (Signed)
T/C from pt to the after hours nurse stating she has a sore throat and her chest feels like she has anxiety.  Pt was advised by AccessNurse to gargle with warm salt water, sip warm chicken broth etc and see her PCP within 24 hrs.  [facility].  Advised to keep 8/4 appt with Dr Irene Limbo and will call if symptoms do not improve and cannot keep appt

## 2021-08-16 ENCOUNTER — Other Ambulatory Visit (HOSPITAL_COMMUNITY): Payer: Self-pay

## 2021-08-16 MED ORDER — AZITHROMYCIN 250 MG PO TABS
ORAL_TABLET | ORAL | 0 refills | Status: DC
  Filled 2021-08-16: qty 6, 5d supply, fill #0

## 2021-08-16 MED ORDER — FLUCONAZOLE 150 MG PO TABS
ORAL_TABLET | ORAL | 0 refills | Status: DC
  Filled 2021-08-16: qty 2, 6d supply, fill #0

## 2021-08-20 ENCOUNTER — Other Ambulatory Visit: Payer: Self-pay

## 2021-08-21 ENCOUNTER — Other Ambulatory Visit: Payer: Self-pay

## 2021-08-21 DIAGNOSIS — C9002 Multiple myeloma in relapse: Secondary | ICD-10-CM

## 2021-08-21 NOTE — Progress Notes (Signed)
Port access for imaging day of MRI 

## 2021-08-22 ENCOUNTER — Telehealth: Payer: Self-pay

## 2021-08-22 ENCOUNTER — Other Ambulatory Visit (HOSPITAL_COMMUNITY): Payer: Self-pay

## 2021-08-22 ENCOUNTER — Other Ambulatory Visit: Payer: Self-pay

## 2021-08-22 DIAGNOSIS — C9000 Multiple myeloma not having achieved remission: Secondary | ICD-10-CM

## 2021-08-22 MED FILL — Dexamethasone Sodium Phosphate Inj 100 MG/10ML: INTRAMUSCULAR | Qty: 2 | Status: AC

## 2021-08-22 MED FILL — Trazodone HCl Tab 50 MG: ORAL | 90 days supply | Qty: 180 | Fill #1 | Status: AC

## 2021-08-22 NOTE — Telephone Encounter (Signed)
Called and advised pt of appt for MR Brain 8/25. PT asked to reschedule. Rescheduled appt for MR Brain to 09/12/21. Advised pt of telephone f/u with Ashlyn 8/31 @ 2:30.

## 2021-08-23 ENCOUNTER — Inpatient Hospital Stay: Payer: 59

## 2021-08-23 ENCOUNTER — Other Ambulatory Visit: Payer: Self-pay

## 2021-08-23 ENCOUNTER — Inpatient Hospital Stay: Payer: 59 | Attending: Hematology | Admitting: Hematology

## 2021-08-23 DIAGNOSIS — C7951 Secondary malignant neoplasm of bone: Secondary | ICD-10-CM | POA: Diagnosis not present

## 2021-08-23 DIAGNOSIS — C9000 Multiple myeloma not having achieved remission: Secondary | ICD-10-CM

## 2021-08-23 DIAGNOSIS — C9001 Multiple myeloma in remission: Secondary | ICD-10-CM | POA: Diagnosis present

## 2021-08-23 DIAGNOSIS — Z7961 Long term (current) use of immunomodulator: Secondary | ICD-10-CM | POA: Insufficient documentation

## 2021-08-23 DIAGNOSIS — Z79899 Other long term (current) drug therapy: Secondary | ICD-10-CM | POA: Insufficient documentation

## 2021-08-23 DIAGNOSIS — Z23 Encounter for immunization: Secondary | ICD-10-CM | POA: Insufficient documentation

## 2021-08-23 DIAGNOSIS — Z5112 Encounter for antineoplastic immunotherapy: Secondary | ICD-10-CM | POA: Diagnosis present

## 2021-08-23 DIAGNOSIS — Z95828 Presence of other vascular implants and grafts: Secondary | ICD-10-CM

## 2021-08-23 DIAGNOSIS — Z7189 Other specified counseling: Secondary | ICD-10-CM

## 2021-08-23 LAB — CBC WITH DIFFERENTIAL (CANCER CENTER ONLY)
Abs Immature Granulocytes: 0 10*3/uL (ref 0.00–0.07)
Basophils Absolute: 0 10*3/uL (ref 0.0–0.1)
Basophils Relative: 1 %
Eosinophils Absolute: 0.2 10*3/uL (ref 0.0–0.5)
Eosinophils Relative: 8 %
HCT: 31.8 % — ABNORMAL LOW (ref 36.0–46.0)
Hemoglobin: 11.1 g/dL — ABNORMAL LOW (ref 12.0–15.0)
Immature Granulocytes: 0 %
Lymphocytes Relative: 41 %
Lymphs Abs: 0.9 10*3/uL (ref 0.7–4.0)
MCH: 31.9 pg (ref 26.0–34.0)
MCHC: 34.9 g/dL (ref 30.0–36.0)
MCV: 91.4 fL (ref 80.0–100.0)
Monocytes Absolute: 0.4 10*3/uL (ref 0.1–1.0)
Monocytes Relative: 20 %
Neutro Abs: 0.6 10*3/uL — ABNORMAL LOW (ref 1.7–7.7)
Neutrophils Relative %: 30 %
Platelet Count: 181 10*3/uL (ref 150–400)
RBC: 3.48 MIL/uL — ABNORMAL LOW (ref 3.87–5.11)
RDW: 15.9 % — ABNORMAL HIGH (ref 11.5–15.5)
Smear Review: NORMAL
WBC Count: 2.1 10*3/uL — ABNORMAL LOW (ref 4.0–10.5)
nRBC: 0 % (ref 0.0–0.2)

## 2021-08-23 LAB — CMP (CANCER CENTER ONLY)
ALT: 11 U/L (ref 0–44)
AST: 11 U/L — ABNORMAL LOW (ref 15–41)
Albumin: 4.1 g/dL (ref 3.5–5.0)
Alkaline Phosphatase: 42 U/L (ref 38–126)
Anion gap: 5 (ref 5–15)
BUN: 19 mg/dL (ref 6–20)
CO2: 30 mmol/L (ref 22–32)
Calcium: 9.2 mg/dL (ref 8.9–10.3)
Chloride: 105 mmol/L (ref 98–111)
Creatinine: 0.77 mg/dL (ref 0.44–1.00)
GFR, Estimated: >60 mL/min (ref >60)
Glucose, Bld: 89 mg/dL (ref 70–99)
Potassium: 4.1 mmol/L (ref 3.5–5.1)
Sodium: 140 mmol/L (ref 135–145)
Total Bilirubin: 0.9 mg/dL (ref 0.3–1.2)
Total Protein: 6.3 g/dL — ABNORMAL LOW (ref 6.5–8.1)

## 2021-08-23 MED ORDER — SODIUM CHLORIDE 0.9 % IV SOLN
10.0000 mg/kg | Freq: Once | INTRAVENOUS | Status: AC
  Administered 2021-08-23: 700 mg via INTRAVENOUS
  Filled 2021-08-23: qty 10

## 2021-08-23 MED ORDER — HEPARIN SOD (PORK) LOCK FLUSH 100 UNIT/ML IV SOLN: 500.0000 [IU] | Freq: Once | INTRAVENOUS | Status: DC | PRN

## 2021-08-23 MED ORDER — ACETAMINOPHEN 325 MG PO TABS
650.0000 mg | ORAL_TABLET | Freq: Once | ORAL | Status: AC
  Administered 2021-08-23: 650 mg via ORAL
  Filled 2021-08-23: qty 2

## 2021-08-23 MED ORDER — DIPHENHYDRAMINE HCL 50 MG/ML IJ SOLN
50.0000 mg | Freq: Once | INTRAMUSCULAR | Status: AC
  Administered 2021-08-23: 50 mg via INTRAVENOUS
  Filled 2021-08-23: qty 1

## 2021-08-23 MED ORDER — SODIUM CHLORIDE 0.9% FLUSH: 10.0000 mL | INTRAVENOUS | Status: DC | PRN

## 2021-08-23 MED ORDER — SODIUM CHLORIDE 0.9% FLUSH
10.0000 mL | Freq: Once | INTRAVENOUS | Status: AC
  Administered 2021-08-23: 10 mL

## 2021-08-23 MED ORDER — MONTELUKAST SODIUM 10 MG PO TABS
10.0000 mg | ORAL_TABLET | Freq: Once | ORAL | Status: AC
  Administered 2021-08-23: 10 mg via ORAL
  Filled 2021-08-23: qty 1

## 2021-08-23 MED ORDER — SODIUM CHLORIDE 0.9 % IV SOLN
20.0000 mg | Freq: Once | INTRAVENOUS | Status: AC
  Administered 2021-08-23: 20 mg via INTRAVENOUS
  Filled 2021-08-23: qty 20

## 2021-08-23 MED ORDER — SODIUM CHLORIDE 0.9 % IV SOLN: Freq: Once | INTRAVENOUS | Status: AC

## 2021-08-23 MED ORDER — FAMOTIDINE IN NACL 20-0.9 MG/50ML-% IV SOLN
20.0000 mg | Freq: Once | INTRAVENOUS | Status: AC
  Administered 2021-08-23: 20 mg via INTRAVENOUS
  Filled 2021-08-23: qty 50

## 2021-08-23 NOTE — Patient Instructions (Signed)
[facility]   Discharge Instructions: Thank you for choosing [facility] to provide your oncology and hematology care.   If you have a lab appointment with the [facility], please go directly to the [facility] and check in at the registration area.   Wear comfortable clothing and clothing appropriate for easy access to any Portacath or PICC line.   We strive to give you quality time with your provider. You may need to reschedule your appointment if you arrive late (15 or more minutes).  Arriving late affects you and other patients whose appointments are after yours.  Also, if you miss three or more appointments without notifying the office, you may be dismissed from the clinic at the provider's discretion.      For prescription refill requests, have your pharmacy contact our office and allow 72 hours for refills to be completed.    Today you received the following chemotherapy and/or immunotherapy agents: [id number]      To help prevent nausea and vomiting after your treatment, we encourage you to take your nausea medication as directed.  BELOW ARE SYMPTOMS THAT SHOULD BE REPORTED IMMEDIATELY: *FEVER GREATER THAN 100.4 F (38 C) OR HIGHER *CHILLS OR SWEATING *NAUSEA AND VOMITING THAT IS NOT CONTROLLED WITH YOUR NAUSEA MEDICATION *UNUSUAL SHORTNESS OF BREATH *UNUSUAL BRUISING OR BLEEDING *URINARY PROBLEMS (pain or burning when urinating, or frequent urination) *BOWEL PROBLEMS (unusual diarrhea, constipation, pain near the anus) TENDERNESS IN MOUTH AND THROAT WITH OR WITHOUT PRESENCE OF ULCERS (sore throat, sores in mouth, or a toothache) UNUSUAL RASH, SWELLING OR PAIN  UNUSUAL VAGINAL DISCHARGE OR ITCHING   Items with * indicate a potential emergency and should be followed up as soon as possible or go to the Emergency Department if any problems should occur.  Please show the CHEMOTHERAPY ALERT CARD or IMMUNOTHERAPY ALERT CARD at  check-in to the Emergency Department and triage nurse.  Should you have questions after your visit or need to cancel or reschedule your appointment, please contact [facility]  Dept: [phone]  and follow the prompts.  Office hours are 8:00 a.m. to 4:30 p.m. Monday - Friday. Please note that voicemails left after 4:00 p.m. may not be returned until the following business day.  We are closed weekends and major holidays. You have access to a nurse at all times for urgent questions. Please call the main number to the clinic Dept: [phone] and follow the prompts.   For any non-urgent questions, you may also contact your provider using MyChart. We now offer e-Visits for anyone 44 and older to request care online for non-urgent symptoms. For details visit [email or web address].   Also download the MyChart app! Go to the app store, search "MyChart", open the app, select [facility], and log in with your MyChart username and password.  Masks are optional in the cancer centers. If you would like for your care team to wear a mask while they are taking care of you, please let them know. For doctor visits, patients may have with them one support person who is at least 58 years old. At this time, visitors are not allowed in the infusion area. Isatuximab Injection What is this medication? ISATUXIMAB (EYE sa TUX i mab) treats a type of bone marrow cancer (multiple myeloma). It works by blocking a protein that causes cancer cells to grow and multiply. This helps slow or stop the spread of cancer cells. This medicine may be used  for other purposes; ask your health care provider or pharmacist if you have questions. COMMON BRAND NAME(S): SARCLISA What should I tell my care team before I take this medication? They need to know if you have any of these conditions: Heart disease Infection especially a viral infection, such as chickenpox, cold sores, herpes An unusual or allergic  reaction to isatuximab, other medications, foods, dyes, or preservatives Pregnant or trying to get pregnant Breast-feeding How should I use this medication? This medication is injected into a vein. It is given by your care team in a hospital or clinic setting. Talk to your care team about the use of this medication in children. Special care may be needed. Overdosage: If you think you have taken too much of this medicine contact a poison control center or emergency room at once. NOTE: This medicine is only for you. Do not share this medicine with others. What if I miss a dose? Keep appointments for follow-up doses. It is important not to miss your dose. Call your care team if you are unable to keep an appointment. What may interact with this medication? Interactions have not been studied. This list may not describe all possible interactions. Give your health care provider a list of all the medicines, herbs, non-prescription drugs, or dietary supplements you use. Also tell them if you smoke, drink alcohol, or use illegal drugs. Some items may interact with your medicine. What should I watch for while using this medication? Your condition will be monitored carefully while you are receiving this medication. You may need blood work done while you are taking this medication. This medication may increase your risk of getting an infection. Call your care team for advice if you get a fever, chills, sore throat, or other symptoms of a cold or flu. Do not treat yourself. Try to avoid being around people who are sick. If you have not had the measles or chickenpox vaccines, tell your care team right away if you are around someone with these viruses. Talk to your care team about your risk of cancer. You may be more at risk for certain types of cancers if you take this medication. This medication can affect the results of blood tests to match your blood type. Your care team will do blood tests to match your blood  type before you start treatment. Tell your care team that you are being treated with this medication before receiving a blood transfusion. Do not become pregnant while taking this medicine or for at least 5 months after stopping it. Inform your doctor if you wish to become pregnant or think you might be pregnant. There is a potential for serious side effects to an unborn child. Talk to your care team for more information. Do not breast-feed while taking this medicine. What side effects may I notice from receiving this medication? Side effects that you should report to your care team as soon as possible: Allergic reactions--skin rash, itching, hives, swelling of the face, lips, tongue, or throat Heart failure--shortness of breath, swelling of the angles, feet, or hands, sudden weight gain, unusual weakness or fatigue Infection--fever, chills, cough, or sore throat Infusion reactions--chest pain, shortness of breath or trouble breathing, feeling faint or lightheaded Side effects that usually do not require medical attention (report these to your care team if they continue or are bothersome): Back pain Diarrhea Increase in blood pressure Trouble sleeping Vomiting This list may not describe all possible side effects. Call your doctor for medical advice about side  effects. You may report side effects to FDA at 1-800-FDA-1088. Where should I keep my medication? This medication is given in a hospital or clinic. It will not be stored at home. NOTE: This sheet is a summary. It may not cover all possible information. If you have questions about this medicine, talk to your doctor, pharmacist, or health care provider.  2023 [employer]/Gold Standard (2020-09-21 00:00:00)

## 2021-08-23 NOTE — Progress Notes (Signed)
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 Per Dr. Irene Limbo ok to proceed with treatment and "holding vaccines today to avoid concerns with neutropenic fever."

## 2021-08-30 ENCOUNTER — Encounter: Payer: Self-pay | Admitting: Hematology

## 2021-08-30 NOTE — Progress Notes (Signed)
HEMATOLOGY/ONCOLOGY CLNIC NOTE  Date of Service: .08/23/2021  Patient Care Team: [name], FNP as PCP - General (General Practice)  CHIEF COMPLAINTS/PURPOSE OF CONSULTATION:  Follow-up for continued valuation and management of multiple myeloma.  HISTORY OF PRESENTING ILLNESS:   Please see previous note for details   INTERVAL HISTORY: [name] is here for continued valuation and management of her multiple myeloma.  She continues to be on Isatuximab and pomalidomide.  This is her week off pomalidomide just starting.  Notes to to have some neutropenia from the pomalidomide and we discussed cutting down the dose from 3 mg down to 2 mg 3 weeks on 1 week off. No reported issues with infection. No new headaches. She is to get her second bivalent COVID-19 booster and will be getting this is an outside pharmacy.  We will schedule her for her  Posttransplant vaccines in the next couple of weeks.  Labs done today were discussed with her in detail.   ONCOLOGIC HISTORY   Lambda Light Chain Multiple Myeloma  A. 09/27/19: CT Maxillofacial enhancing dural-based anterior cranial fossa mass traversing the calvarium and extending into the extracalvarial soft tissues along with a 7.4 mm rightward midline shift at the level of anterior cranial fossa. MRI head with and without contrast is recommended for further evaluation. B. 10/05/19: MRI Head large extra-axial mass anterior left frontal region with bone erosion and extension into the scalp soft tissues. Considerations include hemangiopericytoma, metastasis, and aggressive hemangioma. 2.6 mm enhancing lesion in the clivus.  C. 10/10/19: Left craniectomy with frontal brain tumor/bone resection, path + plasma cell neoplasm, lambda restricted, plasmacytoma favored. Hgb 9.9,  D. 10/31/19: Initial Hem-Onc consultation; M-spike 0; IgG 627, IgQ 51, IgM 11; SFLC: Kappa 1.12 mg/dL, Lambda 154.50, ratio 0.01; IFE: serum + monoclonal free Lambda light  chain; Hgb 10.1,  E. 11/04/19: 24 hour UPEP Total protein 1318, Free lambda light chains Ur 1332.46, ratio 0.02, M-spike 27.4, M-spike 24 hr at 361; Bence Jones positive, Lambda type F. 11/08/19: BMBx 22% atypical plasma cells, 30% by IHC; FISH Dup(1q) and Del(13q) detected G. 11/10/19: PET CT no metabolic activity within the skeleton to localize active myeloma; no lytic lesions identified; no abnormal activity at the left craniectomy site; no evidence of soft tissue plasmacytoma. H. 12/05/19: Started KRD; SFLC: Kappa 1.42, Lambda 177.97, ratio 0.01; Hgb 11.0 I. 12//13/21: SFLC: Kappa 1.45, Lambda 8.92, ratio 0.16; Hgb 11.1 J. 02/06/20: SFLC: Kappa 1.10, Lambda 2.08, ratio 0.53; Hgb 11.3 K. 02/26/20: Cycle 4 of KRD; SFLC: Kappa 1.14, Lambda 1.90, ratio 0.60; Hgb 10.8 L. 04/02/20: SFLC: Kappa 1.05, Lambda 1.56, ratio 0.67; Hgb 11.2 M. 04/30/20: SFLC: Kappa 1.01, Lambda 1.40, ratio 0.72; Hgb 11.5  N. 05/05/20: MRI Brain no evidence of left frontal bone lesion recurrence; Multiple subcentimeter area of nodular enhancement in the calvarium are nonspecific but could related to myeloma given nonvisualization on prior. O. 05/11/20: PET CT no findings to suggest FDG avid lesions of multiple myeloma. P. 05/14/20: BMBx 1% plasma cells Q. 05/28/20: SFLC: Kappa 0.84, Lambda 1.16, ratio 0.72; Hgb 11.6  R. 06/11/20: EKG QT 408/QTc 424; PFT's: FEV1 110%, DLCO 58.8% predicted, 64.4% hgb corrected; ECHO: EF 55% est, 61% calc; GLS -20%; SFLC: Kappa 1.16, Lambda 1.40, ratio 0.83; Hgb 10.9-VGPR S. 07/06/20: SFLC: Kappa 1.16, Lambda 2.14, ratio 0.54; . Hgb 11.7, Plt 118 T. 6/20-7/12/22: Hospital admission for Melphalan 200 mg/m2 followed by reinfusion of 4.71 x 10^6/kg CD34+ autologous cells  MEDICAL HISTORY:  Past Medical History:  Diagnosis Date   Anxiety    Hypertension    Pre-diabetes     SURGICAL HISTORY: Past Surgical History:  Procedure Laterality Date   ABLATION     APPLICATION OF CRANIAL NAVIGATION N/A  10/10/2019   Procedure: APPLICATION OF CRANIAL NAVIGATION;  Surgeon: Consuella Lose, MD;  Location: [facility];  Service: Neurosurgery;  Laterality: N/A;  APPLICATION OF CRANIAL NAVIGATION   CRANIOPLASTY Left 10/14/2019   Procedure: LEFT CRANIOPLASTY WITH PLACEMENT OF CUSTOM CRANIAL IMPLANT;  Surgeon: Consuella Lose, MD;  Location: [facility];  Service: Neurosurgery;  Laterality: Left;   CRANIOTOMY Left 10/10/2019   Procedure: Left frontal Stereotactic craniectomy for resection of tumor;  Surgeon: Consuella Lose, MD;  Location: [facility];  Service: Neurosurgery;  Laterality: Left;  Left frontal Stereotactic craniectomy for resection of tumor   HAMMER TOE SURGERY Left    IR IMAGING GUIDED PORT INSERTION  12/01/2019   IR IMAGING GUIDED PORT INSERTION  08/06/2021   TUBAL LIGATION      SOCIAL HISTORY: Social History   Socioeconomic History   Marital status: Single    Spouse name: Not on file   Number of children: Not on file   Years of education: Not on file   Highest education level: 12th grade  Occupational History   Not on file  Tobacco Use   Smoking status: Never   Smokeless tobacco: Never  Vaping Use   Vaping Use: Never used  Substance and Sexual Activity   Alcohol use: Yes    Comment: occassionally   Drug use: Not Currently   Sexual activity: Not on file  Other Topics Concern   Not on file  Social History Narrative   Not on file   Social Determinants of Health   Financial Resource Strain: Medium Risk (08/13/2020)   Overall Financial Resource Strain (CARDIA)    Difficulty of Paying Living Expenses: Somewhat hard  Food Insecurity: Food Insecurity Present (08/13/2020)   Hunger Vital Sign    Worried About Running Out of Food in the Last Year: Sometimes true    Ran Out of Food in the Last Year: Never true  Transportation Needs: Unmet Transportation Needs (08/13/2020)   PRAPARE - Transportation    Lack of Transportation (Medical): No    Lack of Transportation (Non-Medical): Yes   Physical Activity: Sufficiently Active (08/13/2020)   Exercise Vital Sign    Days of Exercise per Week: 5 days    Minutes of Exercise per Session: 30 min  Stress: No Stress Concern Present (08/13/2020)   [employer] [place]    Feeling of Stress : Only a little  Social Connections: Moderately Integrated (08/13/2020)   Social Connection and Isolation Panel [NHANES]    Frequency of Communication with Friends and Family: More than three times a week    Frequency of Social Gatherings with Friends and Family: Once a week    Attends Religious Services: More than 4 times per year    Active Member of [employer] or Organizations: Yes    Attends Archivist Meetings: 1 to 4 times per year    Marital Status: Divorced  Human resources officer Violence: Not on file    FAMILY HISTORY: Family History  Problem Relation Age of Onset   Breast cancer Maternal Grandmother    Heart disease Mother    Hypertension Mother    Sarcoidosis Father    Diabetes Maternal Grandfather     ALLERGIES:  has No Known Allergies.  MEDICATIONS:  Current Outpatient  Medications  Medication Sig Dispense Refill   atorvastatin (LIPITOR) 10 MG tablet Take 1 tablet (10 mg total) by mouth daily. 30 tablet 1   azithromycin (ZITHROMAX) 250 MG tablet Take 2 tablets by mouth daily for 1 day, then take 1 tablet daily for 4 days. 6 tablet 0   benazepril (LOTENSIN) 5 MG tablet Take 1 tablet (5 mg total) by mouth daily. 90 tablet 1   docusate sodium (COLACE) 100 MG capsule Take 100 mg by mouth 2 (two) times daily.     fluconazole (DIFLUCAN) 150 MG tablet Take 1 tablet by mouth every 3 days 2 tablet 0   hydrochlorothiazide (HYDRODIURIL) 12.5 MG tablet Take 1 tablet by mouth daily as needed for leg swelling 90 tablet 1   levocetirizine (XYZAL) 5 MG tablet TAKE 1 TABLET BY MOUTH EVERY DAY IN THE EVENING 90 tablet 3   OZEMPIC, 1 MG/DOSE, 4 MG/3ML SOPN INJECT 1 MG UNDER THE SKIN ONCE  WEEKLY, ON THE SAME DAY. 9 mL 1   POMALYST 3 MG capsule TAKE 1 CAPSULE BY MOUTH ONCE DAILY FOR 21 DAYS ON AND 7 DAYS OFF 21 capsule 0   traZODone (DESYREL) 50 MG tablet TAKE 1 TO 2 TABLETS BY MOUTH AT BEDTIME AS NEEDED FOR SLEEP 180 tablet 1   valACYclovir (VALTREX) 500 MG tablet Take 1 tablet by mouth 2 times a day 60 tablet 10   No current facility-administered medications for this visit.    REVIEW OF SYSTEMS:   10 Point review of Systems was done is negative except as noted above.   PHYSICAL EXAMINATION: ECOG PERFORMANCE STATUS: 1 - Symptomatic but completely ambulatory . NAD GENERAL:alert, in no acute distress and comfortable SKIN: no acute rashes, no significant lesions EYES: conjunctiva are pink and non-injected, sclera anicteric OROPHARYNX: MMM, no exudates, no oropharyngeal erythema or ulceration NECK: supple, no JVD LYMPH:  no palpable lymphadenopathy in the cervical, axillary or inguinal regions LUNGS: clear to auscultation b/l with normal respiratory effort HEART: regular rate & rhythm ABDOMEN:  normoactive bowel sounds , non tender, not distended. Extremity: no pedal edema PSYCH: alert & oriented x 3 with fluent speech NEURO: no focal motor/sensory deficits    Physical exam performed in infusion chair.  LABORATORY DATA:  I have reviewed the data as listed     Latest Ref Rng & Units 08/23/2021   12:48 PM 08/09/2021    1:14 PM 08/01/2021    1:58 PM  CBC  WBC 4.0 - 10.5 K/uL 2.1  3.6  3.0   Hemoglobin 12.0 - 15.0 g/dL 11.1  12.0  12.1   Hematocrit 36.0 - 46.0 % 31.8  33.7  34.1   Platelets 150 - 400 K/uL 181  206  284        Latest Ref Rng & Units 08/23/2021   12:48 PM 08/09/2021    1:14 PM 08/01/2021    1:58 PM  CMP  Glucose 70 - 99 mg/dL 89  116  113   BUN 6 - 20 mg/dL 19  14  10    Creatinine 0.44 - 1.00 mg/dL 0.77  0.77  0.87   Sodium 135 - 145 mmol/L 140  140  142   Potassium 3.5 - 5.1 mmol/L 4.1  4.0  3.8   Chloride 98 - 111 mmol/L 105  106  108   CO2  22 - 32 mmol/L 30  29  28    Calcium 8.9 - 10.3 mg/dL 9.2  9.4  9.7   Total Protein 6.5 -  8.1 g/dL 6.3  5.9  6.4   Total Bilirubin 0.3 - 1.2 mg/dL 0.9  0.7  0.7   Alkaline Phos 38 - 126 U/L 42  41  39   AST 15 - 41 U/L 11  10  11    ALT 0 - 44 U/L 11  11  10     11/08/2019 FISH Plasma Cell Myeloma Prognostic Panel:    11/08/2019 Cytogenetics:    11/08/2019 Bone Marrow Report [id number]):   10/10/2019 Surgical Pathology [phone]):   05/14/2020 Surgical Pathology "-Normocellular bone marrow for age with trilineage hematopoiesis and 1%  plasma cells  -See comment   PERIPHERAL BLOOD:  -Normocytic-normochromic anemia  -Leukopenia   COMMENT:   The bone marrow is generally normocellular for age with trilineage  hematopoiesis but with predominance of erythroid precursors.  The  myeloid changes are not considered specific and likely related to  therapy.  In this background, the plasma cells represent 1% of all cells  associated with scattered small clusters and display slight lambda light  chain excess.  The findings are limited but most suggestive of minimal  residual plasma cell neoplasm.  Correlation with cytogenetic and FISH  studies is recommended. "  RADIOGRAPHIC STUDIES:  I have personally reviewed the radiological images as listed and agreed with the findings in the report. IR IMAGING GUIDED PORT INSERTION  Result Date: 08/06/2021 INDICATION: 56 year old with multiple myeloma and status post transplant. Port-A-Cath needed for continued treatment. EXAM: FLUOROSCOPIC AND ULTRASOUND GUIDED PLACEMENT OF A SUBCUTANEOUS PORT COMPARISON:  None Available. MEDICATIONS: Benadryl 25 mg ANESTHESIA/SEDATION: Moderate (conscious) sedation was employed during this procedure. A total of Versed 2.0 mg and fentanyl 100 mcg was administered intravenously at the order of the provider performing the procedure. Total intra-service moderate sedation time: 24 minutes. Patient's level of  consciousness and vital signs were monitored continuously by radiology nurse throughout the procedure under the supervision of the provider performing the procedure. FLUOROSCOPY TIME:  Radiation Exposure Index (as provided by the fluoroscopic device): 1 mGy Kerma COMPLICATIONS: None immediate. PROCEDURE: The procedure, risks, benefits, and alternatives were explained to the patient. Questions regarding the procedure were encouraged and answered. The patient understands and consents to the procedure. Patient was placed supine on the interventional table. Ultrasound confirmed a patent right internal jugular vein. Ultrasound image was saved for documentation. The right chest and neck were cleaned with a skin antiseptic and a sterile drape was placed. Maximal barrier sterile technique was utilized including caps, mask, sterile gowns, sterile gloves, sterile drape, hand hygiene and skin antiseptic. The right neck was anesthetized with 1% lidocaine. Small incision was made in the right neck with a blade. Micropuncture set was placed in the right internal jugular vein with ultrasound guidance. The micropuncture wire was used for measurement purposes. The right chest was anesthetized with 1% lidocaine with epinephrine. #15 blade was used to make an incision and a subcutaneous port pocket was formed. [place] was assembled. Subcutaneous tunnel was formed with a stiff tunneling device. The port catheter was brought through the subcutaneous tunnel. The port was placed in the subcutaneous pocket. The micropuncture set was exchanged for a peel-away sheath. The catheter was placed through the peel-away sheath and the tip was positioned at the superior cavoatrial junction. Catheter placement was confirmed with fluoroscopy. The port was accessed and flushed with heparinized saline. The port pocket was closed using two layers of absorbable sutures and Dermabond. The vein skin site was closed using a single layer of  absorbable suture and Dermabond. Sterile dressings were applied. Patient tolerated the procedure well without an immediate complication. Ultrasound and fluoroscopic images were taken and saved for this procedure. IMPRESSION: Placement of a subcutaneous power-injectable port device. Catheter tip at the superior cavoatrial junction. Electronically Signed   By: Markus Daft M.D.   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 No M spike. Lambda light chain myeloma + Anemia + bone lesion on skull-- resected No renal failure, hypercalcemia  BMBx- 22-30% lambda restricted plasma cell MolCy Dup (1q), del (13q)  Completed 7 cycles of KRD  The pt recently had a transplant at [facility]. DISEASE Lambda Light Chain Multiple Myeloma  - She will receive Melphalan 200 mg/m2 conditioning regimen, followed by autologous stem cell rescue (see details below).  TRANSPLANT: Preparative regimen:  - Melphalan 200 mg/m2 (331m) IV: 07/09/20  Stem Cells:  - Autologous stem cell reinfusion on 07/10/20. Patient received 4.71 x 10^6 CD34+/kg. - Her course was complicated by neutropenic fevers and Bcx 6/27 positive for E coli. She received IV cefepime from 6/27 until 6/30, which cefepime was switched to zosyn in the setting of new abdominal pain. CT abdomen/pelvis was performed that showed acute uncomplicated multifocal diverticulitis involving the transverse and descending colon. She was treated with bowel rest, prn pain medication, and IV zosyn, which was continued until engraftment on 7/5 and was transitioned to ppx ceftriaxone. Unfortunately, with the transition to ceftriaxone she had recurrent fevers and as a result was restarted on  zosyn. Zosyn was transitioned to oral cipro/flagyl on 7/10 with the plan to continue for a 7 day course.    Bone marrow biopsy done on 01/18/2021 showed normocellular bone marrow with no morphologic evidence of plasma cell neoplasm. 24-hour UPEP on 01/22/2021 showed unremarkable immunofixation pattern and no monoclonal protein. PET CT scan 01/16/2021-negative for FDG avid lesions of myeloma MRI brain 01/12/2021-showed no overt active myeloma lesions.  3) prediabetes 4) hypertension 5) anxiety 6) dyslipidemia PLAN: -Patient's labs from today were discussed with her in detail. -She will continue her Isatuximab as per orders. -We will cut down her pomalidomide to 2 mg p.o. daily in the context of some diarrhea and muscle cramping and significant neutropenia.  She is currently on her week off and we hope her white counts/neutropenia will improve. Patient reports no notable toxicities from her new regimen with Isatuximab and Pomalidomide. -We shall continue her same regimen of supportive medications. -Orders were placed and signed.. -Continue maintenance Zometa every 3 months. -Continue prophylaxis with baby aspirin Valtrex and B complex.   Followup  Please schedule next 2 cycles of Isatuximab. Labs with each treatment MD visit every 4 weeks. Zometa every 3 months please schedule next 2 doses  The total time spent in the appointment was 32 minutes*.  All of the patient's questions were answered with apparent satisfaction. The patient knows to call the clinic with any problems, questions or concerns.   GSullivan LoneMD MS AAHIVMS SSpectrum Health Butterworth CampusCSpalding Rehabilitation HospitalHematology/Oncology Physician [facility] .*Total Encounter Time as defined by the Centers for Medicare and Medicaid Services includes, in addition to the face-to-face time of a patient visit (documented in the note above) non-face-to-face time: obtaining and reviewing outside history,  ordering and reviewing medications, tests or  procedures, care coordination (communications with other health care professionals or caregivers) and documentation in the medical record.

## 2021-09-02 ENCOUNTER — Other Ambulatory Visit: Payer: Self-pay

## 2021-09-02 DIAGNOSIS — C9000 Multiple myeloma not having achieved remission: Secondary | ICD-10-CM

## 2021-09-04 ENCOUNTER — Inpatient Hospital Stay: Payer: 59

## 2021-09-04 ENCOUNTER — Other Ambulatory Visit: Payer: Self-pay

## 2021-09-04 ENCOUNTER — Other Ambulatory Visit: Payer: Self-pay | Admitting: Hematology

## 2021-09-04 ENCOUNTER — Encounter: Payer: Self-pay | Admitting: General Practice

## 2021-09-04 VITALS — BP 101/77 | HR 62 | Temp 98.0°F | Resp 18 | Wt 166.1 lb

## 2021-09-04 DIAGNOSIS — C9000 Multiple myeloma not having achieved remission: Secondary | ICD-10-CM

## 2021-09-04 DIAGNOSIS — Z7189 Other specified counseling: Secondary | ICD-10-CM

## 2021-09-04 DIAGNOSIS — C7951 Secondary malignant neoplasm of bone: Secondary | ICD-10-CM

## 2021-09-04 DIAGNOSIS — Z95828 Presence of other vascular implants and grafts: Secondary | ICD-10-CM

## 2021-09-04 DIAGNOSIS — Z5112 Encounter for antineoplastic immunotherapy: Secondary | ICD-10-CM | POA: Diagnosis not present

## 2021-09-04 LAB — CMP (CANCER CENTER ONLY)
ALT: 13 U/L (ref 0–44)
AST: 12 U/L — ABNORMAL LOW (ref 15–41)
Albumin: 3.8 g/dL (ref 3.5–5.0)
Alkaline Phosphatase: 38 U/L (ref 38–126)
Anion gap: 4 — ABNORMAL LOW (ref 5–15)
BUN: 15 mg/dL (ref 6–20)
CO2: 27 mmol/L (ref 22–32)
Calcium: 9.2 mg/dL (ref 8.9–10.3)
Chloride: 109 mmol/L (ref 98–111)
Creatinine: 0.78 mg/dL (ref 0.44–1.00)
GFR, Estimated: >60 mL/min (ref >60)
Glucose, Bld: 106 mg/dL — ABNORMAL HIGH (ref 70–99)
Potassium: 3.9 mmol/L (ref 3.5–5.1)
Sodium: 140 mmol/L (ref 135–145)
Total Bilirubin: 0.4 mg/dL (ref 0.3–1.2)
Total Protein: 6.2 g/dL — ABNORMAL LOW (ref 6.5–8.1)

## 2021-09-04 LAB — CBC WITH DIFFERENTIAL (CANCER CENTER ONLY)
Abs Immature Granulocytes: 0.01 10*3/uL (ref 0.00–0.07)
Basophils Absolute: 0 10*3/uL (ref 0.0–0.1)
Basophils Relative: 1 %
Eosinophils Absolute: 0.1 10*3/uL (ref 0.0–0.5)
Eosinophils Relative: 4 %
HCT: 32.1 % — ABNORMAL LOW (ref 36.0–46.0)
Hemoglobin: 11.3 g/dL — ABNORMAL LOW (ref 12.0–15.0)
Immature Granulocytes: 0 %
Lymphocytes Relative: 34 %
Lymphs Abs: 0.9 10*3/uL (ref 0.7–4.0)
MCH: 32.3 pg (ref 26.0–34.0)
MCHC: 35.2 g/dL (ref 30.0–36.0)
MCV: 91.7 fL (ref 80.0–100.0)
Monocytes Absolute: 0.3 10*3/uL (ref 0.1–1.0)
Monocytes Relative: 10 %
Neutro Abs: 1.4 10*3/uL — ABNORMAL LOW (ref 1.7–7.7)
Neutrophils Relative %: 51 %
Platelet Count: 230 10*3/uL (ref 150–400)
RBC: 3.5 MIL/uL — ABNORMAL LOW (ref 3.87–5.11)
RDW: 15.9 % — ABNORMAL HIGH (ref 11.5–15.5)
WBC Count: 2.8 10*3/uL — ABNORMAL LOW (ref 4.0–10.5)
nRBC: 0 % (ref 0.0–0.2)

## 2021-09-04 MED ORDER — FAMOTIDINE IN NACL 20-0.9 MG/50ML-% IV SOLN
20.0000 mg | Freq: Once | INTRAVENOUS | Status: AC
  Administered 2021-09-04: 20 mg via INTRAVENOUS

## 2021-09-04 MED ORDER — MONTELUKAST SODIUM 10 MG PO TABS
10.0000 mg | ORAL_TABLET | Freq: Once | ORAL | Status: AC
  Administered 2021-09-04: 10 mg via ORAL

## 2021-09-04 MED ORDER — MONTELUKAST SODIUM 10 MG PO TABS
ORAL_TABLET | ORAL | Status: AC
  Filled 2021-09-04: qty 1

## 2021-09-04 MED ORDER — SODIUM CHLORIDE 0.9 % IV SOLN
10.0000 mg/kg | Freq: Once | INTRAVENOUS | Status: AC
  Administered 2021-09-04: 700 mg via INTRAVENOUS
  Filled 2021-09-04: qty 25

## 2021-09-04 MED ORDER — ZOLEDRONIC ACID 4 MG/100ML IV SOLN
4.0000 mg | Freq: Once | INTRAVENOUS | Status: AC
  Administered 2021-09-04: 4 mg via INTRAVENOUS

## 2021-09-04 MED ORDER — HEPARIN SOD (PORK) LOCK FLUSH 100 UNIT/ML IV SOLN: 500.0000 [IU] | Freq: Once | INTRAVENOUS | Status: DC | PRN

## 2021-09-04 MED ORDER — HEPARIN SOD (PORK) LOCK FLUSH 100 UNIT/ML IV SOLN
500.0000 [IU] | Freq: Once | INTRAVENOUS | Status: AC
  Administered 2021-09-04: 500 [IU]

## 2021-09-04 MED ORDER — ACETAMINOPHEN 325 MG PO TABS
ORAL_TABLET | ORAL | Status: AC
  Filled 2021-09-04: qty 2

## 2021-09-04 MED ORDER — SODIUM CHLORIDE 0.9 % IV SOLN: Freq: Once | INTRAVENOUS | Status: AC

## 2021-09-04 MED ORDER — ACETAMINOPHEN 325 MG PO TABS
650.0000 mg | ORAL_TABLET | Freq: Once | ORAL | Status: AC
  Administered 2021-09-04: 650 mg via ORAL

## 2021-09-04 MED ORDER — DIPHENHYDRAMINE HCL 50 MG/ML IJ SOLN
INTRAMUSCULAR | Status: AC
  Filled 2021-09-04: qty 1

## 2021-09-04 MED ORDER — SODIUM CHLORIDE 0.9% FLUSH: 10.0000 mL | INTRAVENOUS | Status: DC | PRN

## 2021-09-04 MED ORDER — DIPHENHYDRAMINE HCL 50 MG/ML IJ SOLN
50.0000 mg | Freq: Once | INTRAMUSCULAR | Status: AC
  Administered 2021-09-04: 50 mg via INTRAVENOUS

## 2021-09-04 MED ORDER — SODIUM CHLORIDE 0.9 % IV SOLN
20.0000 mg | Freq: Once | INTRAVENOUS | Status: AC
  Administered 2021-09-04: 20 mg via INTRAVENOUS
  Filled 2021-09-04: qty 20

## 2021-09-04 MED ORDER — SODIUM CHLORIDE 0.9% FLUSH
10.0000 mL | Freq: Once | INTRAVENOUS | Status: AC
  Administered 2021-09-04: 10 mL

## 2021-09-04 MED ORDER — ZOLEDRONIC ACID 4 MG/100ML IV SOLN
INTRAVENOUS | Status: AC
  Filled 2021-09-04: qty 100

## 2021-09-04 MED ORDER — FAMOTIDINE IN NACL 20-0.9 MG/50ML-% IV SOLN
INTRAVENOUS | Status: AC
  Filled 2021-09-04: qty 50

## 2021-09-04 NOTE — Patient Instructions (Signed)
[facility] ONCOLOGY   Discharge Instructions: Thank you for choosing [facility] to provide your oncology and hematology care.   If you have a lab appointment with the [facility], please go directly to the [facility] and check in at the registration area.   Wear comfortable clothing and clothing appropriate for easy access to any Portacath or PICC line.   We strive to give you quality time with your provider. You may need to reschedule your appointment if you arrive late (15 or more minutes).  Arriving late affects you and other patients whose appointments are after yours.  Also, if you miss three or more appointments without notifying the office, you may be dismissed from the clinic at the provider's discretion.      For prescription refill requests, have your pharmacy contact our office and allow 72 hours for refills to be completed.    Today you received the following chemotherapy and/or immunotherapy agents: isatuximab      To help prevent nausea and vomiting after your treatment, we encourage you to take your nausea medication as directed.  BELOW ARE SYMPTOMS THAT SHOULD BE REPORTED IMMEDIATELY: *FEVER GREATER THAN 100.4 F (38 C) OR HIGHER *CHILLS OR SWEATING *NAUSEA AND VOMITING THAT IS NOT CONTROLLED WITH YOUR NAUSEA MEDICATION *UNUSUAL SHORTNESS OF BREATH *UNUSUAL BRUISING OR BLEEDING *URINARY PROBLEMS (pain or burning when urinating, or frequent urination) *BOWEL PROBLEMS (unusual diarrhea, constipation, pain near the anus) TENDERNESS IN MOUTH AND THROAT WITH OR WITHOUT PRESENCE OF ULCERS (sore throat, sores in mouth, or a toothache) UNUSUAL RASH, SWELLING OR PAIN  UNUSUAL VAGINAL DISCHARGE OR ITCHING   Items with * indicate a potential emergency and should be followed up as soon as possible or go to the Emergency Department if any problems should occur.  Please show the CHEMOTHERAPY ALERT CARD or IMMUNOTHERAPY ALERT CARD at check-in  to the Emergency Department and triage nurse.  Should you have questions after your visit or need to cancel or reschedule your appointment, please contact [facility]  Dept: [phone]  and follow the prompts.  Office hours are 8:00 a.m. to 4:30 p.m. Monday - Friday. Please note that voicemails left after 4:00 p.m. may not be returned until the following business day.  We are closed weekends and major holidays. You have access to a nurse at all times for urgent questions. Please call the main number to the clinic Dept: [phone] and follow the prompts.   For any non-urgent questions, you may also contact your provider using MyChart. We now offer e-Visits for anyone 92 and older to request care online for non-urgent symptoms. For details visit [email or web address].   Also download the MyChart app! Go to the app store, search "MyChart", open the app, select [facility], and log in with your MyChart username and password.  Masks are optional in the cancer centers. If you would like for your care team to wear a mask while they are taking care of you, please let them know. You may have one support person who is at least 58 years old accompany you for your appointments.

## 2021-09-04 NOTE — Progress Notes (Signed)
[facility] Spiritual Care Note  Reached Alexandra [name] by phone for follow-up pastoral support through grief and cancer treatment. She notes that she is working with a Social worker and would also like to connect with chaplain for spiritual support after her upcoming move. Plan to phone again in early September to explore scheduling a Spiritual Care appointment.   [place], [place], [facility] Pager [phone] Voicemail [phone]

## 2021-09-04 NOTE — Progress Notes (Signed)
Ok to treat with ANC 1.4 per Dr Irene Limbo.

## 2021-09-05 ENCOUNTER — Inpatient Hospital Stay: Payer: 59

## 2021-09-05 LAB — KAPPA/LAMBDA LIGHT CHAINS
Kappa free light chain: 12.1 mg/L (ref 3.3–19.4)
Kappa, lambda light chain ratio: 0.98 (ref 0.26–1.65)
Lambda free light chains: 12.3 mg/L (ref 5.7–26.3)

## 2021-09-06 ENCOUNTER — Inpatient Hospital Stay: Payer: 59

## 2021-09-09 ENCOUNTER — Other Ambulatory Visit: Payer: Self-pay

## 2021-09-09 ENCOUNTER — Other Ambulatory Visit: Payer: Self-pay | Admitting: Hematology

## 2021-09-09 DIAGNOSIS — C7951 Secondary malignant neoplasm of bone: Secondary | ICD-10-CM

## 2021-09-09 DIAGNOSIS — C9000 Multiple myeloma not having achieved remission: Secondary | ICD-10-CM

## 2021-09-09 DIAGNOSIS — Z7189 Other specified counseling: Secondary | ICD-10-CM

## 2021-09-09 LAB — MULTIPLE MYELOMA PANEL, SERUM
Albumin SerPl Elph-Mcnc: 3.5 g/dL (ref 2.9–4.4)
Albumin/Glob SerPl: 1.7 (ref 0.7–1.7)
Alpha 1: 0.2 g/dL (ref 0.0–0.4)
Alpha2 Glob SerPl Elph-Mcnc: 0.7 g/dL (ref 0.4–1.0)
B-Globulin SerPl Elph-Mcnc: 0.8 g/dL (ref 0.7–1.3)
Gamma Glob SerPl Elph-Mcnc: 0.5 g/dL (ref 0.4–1.8)
Globulin, Total: 2.1 g/dL — ABNORMAL LOW (ref 2.2–3.9)
IgA: 39 mg/dL — ABNORMAL LOW (ref 87–352)
IgG (Immunoglobin G), Serum: 485 mg/dL — ABNORMAL LOW (ref 586–1602)
IgM (Immunoglobulin M), Srm: 6 mg/dL — ABNORMAL LOW (ref 26–217)
Total Protein ELP: 5.6 g/dL — ABNORMAL LOW (ref 6.0–8.5)

## 2021-09-09 MED ORDER — POMALIDOMIDE 2 MG PO CAPS: ORAL_CAPSULE | ORAL | 0 refills | Status: DC

## 2021-09-09 MED ORDER — POMALIDOMIDE 2 MG PO CAPS: 2.0000 mg | ORAL_CAPSULE | Freq: Every day | ORAL | 0 refills | Status: DC

## 2021-09-12 ENCOUNTER — Inpatient Hospital Stay: Admission: RE | Admit: 2021-09-12 | Payer: 59 | Source: Ambulatory Visit

## 2021-09-13 ENCOUNTER — Inpatient Hospital Stay: Admission: RE | Admit: 2021-09-13 | Payer: 59 | Source: Ambulatory Visit

## 2021-09-17 ENCOUNTER — Other Ambulatory Visit (HOSPITAL_COMMUNITY): Payer: Self-pay

## 2021-09-17 MED ORDER — VALACYCLOVIR HCL 500 MG PO TABS
500.0000 mg | ORAL_TABLET | Freq: Two times a day (BID) | ORAL | 10 refills | Status: DC
  Filled 2021-09-17: qty 60, 30d supply, fill #0
  Filled 2021-10-20: qty 60, 30d supply, fill #1
  Filled 2021-12-01: qty 60, 30d supply, fill #2
  Filled 2022-01-01: qty 60, 30d supply, fill #3
  Filled 2022-02-13: qty 60, 30d supply, fill #4
  Filled 2022-03-23: qty 60, 30d supply, fill #5
  Filled 2022-04-23: qty 60, 30d supply, fill #6
  Filled 2022-05-31 (×2): qty 60, 30d supply, fill #7
  Filled 2022-07-04 (×2): qty 60, 30d supply, fill #8
  Filled 2022-08-25: qty 60, 30d supply, fill #9

## 2021-09-18 ENCOUNTER — Other Ambulatory Visit: Payer: Self-pay | Admitting: Hematology

## 2021-09-18 ENCOUNTER — Inpatient Hospital Stay: Payer: 59

## 2021-09-18 ENCOUNTER — Inpatient Hospital Stay: Payer: 59 | Admitting: Hematology

## 2021-09-18 ENCOUNTER — Other Ambulatory Visit: Payer: Self-pay

## 2021-09-18 DIAGNOSIS — C9000 Multiple myeloma not having achieved remission: Secondary | ICD-10-CM | POA: Diagnosis not present

## 2021-09-18 DIAGNOSIS — C7951 Secondary malignant neoplasm of bone: Secondary | ICD-10-CM | POA: Diagnosis not present

## 2021-09-18 DIAGNOSIS — Z7189 Other specified counseling: Secondary | ICD-10-CM

## 2021-09-18 DIAGNOSIS — Z5112 Encounter for antineoplastic immunotherapy: Secondary | ICD-10-CM | POA: Diagnosis not present

## 2021-09-18 DIAGNOSIS — Z95828 Presence of other vascular implants and grafts: Secondary | ICD-10-CM

## 2021-09-18 LAB — CMP (CANCER CENTER ONLY)
ALT: 15 U/L (ref 0–44)
AST: 15 U/L (ref 15–41)
Albumin: 4 g/dL (ref 3.5–5.0)
Alkaline Phosphatase: 37 U/L — ABNORMAL LOW (ref 38–126)
Anion gap: 8 (ref 5–15)
BUN: 20 mg/dL (ref 6–20)
CO2: 29 mmol/L (ref 22–32)
Calcium: 9.5 mg/dL (ref 8.9–10.3)
Chloride: 105 mmol/L (ref 98–111)
Creatinine: 0.78 mg/dL (ref 0.44–1.00)
GFR, Estimated: >60 mL/min (ref >60)
Glucose, Bld: 109 mg/dL — ABNORMAL HIGH (ref 70–99)
Potassium: 3.5 mmol/L (ref 3.5–5.1)
Sodium: 142 mmol/L (ref 135–145)
Total Bilirubin: 0.7 mg/dL (ref 0.3–1.2)
Total Protein: 6.3 g/dL — ABNORMAL LOW (ref 6.5–8.1)

## 2021-09-18 LAB — CBC WITH DIFFERENTIAL (CANCER CENTER ONLY)
Abs Immature Granulocytes: 0.01 10*3/uL (ref 0.00–0.07)
Basophils Absolute: 0 10*3/uL (ref 0.0–0.1)
Basophils Relative: 2 %
Eosinophils Absolute: 0.3 10*3/uL (ref 0.0–0.5)
Eosinophils Relative: 13 %
HCT: 30.6 % — ABNORMAL LOW (ref 36.0–46.0)
Hemoglobin: 10.9 g/dL — ABNORMAL LOW (ref 12.0–15.0)
Immature Granulocytes: 0 %
Lymphocytes Relative: 33 %
Lymphs Abs: 0.8 10*3/uL (ref 0.7–4.0)
MCH: 32.2 pg (ref 26.0–34.0)
MCHC: 35.6 g/dL (ref 30.0–36.0)
MCV: 90.3 fL (ref 80.0–100.0)
Monocytes Absolute: 0.4 10*3/uL (ref 0.1–1.0)
Monocytes Relative: 18 %
Neutro Abs: 0.8 10*3/uL — ABNORMAL LOW (ref 1.7–7.7)
Neutrophils Relative %: 34 %
Platelet Count: 165 10*3/uL (ref 150–400)
RBC: 3.39 MIL/uL — ABNORMAL LOW (ref 3.87–5.11)
RDW: 15.5 % (ref 11.5–15.5)
WBC Count: 2.5 10*3/uL — ABNORMAL LOW (ref 4.0–10.5)
nRBC: 0 % (ref 0.0–0.2)

## 2021-09-18 MED ORDER — SODIUM CHLORIDE 0.9% FLUSH
10.0000 mL | INTRAVENOUS | Status: DC | PRN
  Administered 2021-09-18: 10 mL

## 2021-09-18 MED ORDER — SODIUM CHLORIDE 0.9 % IV SOLN
700.0000 mg | Freq: Once | INTRAVENOUS | Status: AC
  Administered 2021-09-18: 700 mg via INTRAVENOUS
  Filled 2021-09-18: qty 25

## 2021-09-18 MED ORDER — ACETAMINOPHEN 325 MG PO TABS
650.0000 mg | ORAL_TABLET | Freq: Once | ORAL | Status: AC
  Administered 2021-09-18: 650 mg via ORAL
  Filled 2021-09-18: qty 2

## 2021-09-18 MED ORDER — HEPARIN SOD (PORK) LOCK FLUSH 100 UNIT/ML IV SOLN
500.0000 [IU] | Freq: Once | INTRAVENOUS | Status: AC | PRN
  Administered 2021-09-18: 500 [IU]

## 2021-09-18 MED ORDER — FAMOTIDINE IN NACL 20-0.9 MG/50ML-% IV SOLN
20.0000 mg | Freq: Once | INTRAVENOUS | Status: AC
  Administered 2021-09-18: 20 mg via INTRAVENOUS
  Filled 2021-09-18: qty 50

## 2021-09-18 MED ORDER — MONTELUKAST SODIUM 10 MG PO TABS
10.0000 mg | ORAL_TABLET | Freq: Once | ORAL | Status: AC
  Administered 2021-09-18: 10 mg via ORAL
  Filled 2021-09-18: qty 1

## 2021-09-18 MED ORDER — DIPHENHYDRAMINE HCL 50 MG/ML IJ SOLN
50.0000 mg | Freq: Once | INTRAMUSCULAR | Status: AC
  Administered 2021-09-18: 50 mg via INTRAVENOUS
  Filled 2021-09-18: qty 1

## 2021-09-18 MED ORDER — PNEUMOCOCCAL 13-VAL CONJ VACC IM SUSP
0.5000 mL | Freq: Once | INTRAMUSCULAR | Status: DC
  Filled 2021-09-18: qty 0.5

## 2021-09-18 MED ORDER — SODIUM CHLORIDE 0.9 % IV SOLN
40.0000 mg | Freq: Once | INTRAVENOUS | Status: AC
  Administered 2021-09-18: 40 mg via INTRAVENOUS
  Filled 2021-09-18: qty 4

## 2021-09-18 MED ORDER — HEPATITIS B VAC RECOMBINANT 20 MCG/ML IJ SUSP
2.0000 mL | Freq: Once | INTRAMUSCULAR | Status: AC
  Administered 2021-09-18: 40 ug via INTRAMUSCULAR
  Filled 2021-09-18: qty 2

## 2021-09-18 MED ORDER — SODIUM CHLORIDE 0.9% FLUSH
10.0000 mL | Freq: Once | INTRAVENOUS | Status: AC
  Administered 2021-09-18: 10 mL

## 2021-09-18 MED ORDER — DTAP-IPV-HIB VACCINE IM SUSR
0.5000 mL | Freq: Once | INTRAMUSCULAR | Status: AC
  Administered 2021-09-18: 0.5 mL via INTRAMUSCULAR
  Filled 2021-09-18: qty 1

## 2021-09-18 MED ORDER — DTAP-IPV-HIB VACCINE IM SUSR: 0.5000 mL | Freq: Once | INTRAMUSCULAR | Status: DC

## 2021-09-18 MED ORDER — SODIUM CHLORIDE 0.9 % IV SOLN: Freq: Once | INTRAVENOUS | Status: AC

## 2021-09-18 NOTE — Addendum Note (Signed)
Addended by: Tora Kindred on: 09/18/2021 04:36 PM   Modules accepted: Orders

## 2021-09-18 NOTE — Addendum Note (Signed)
Addended by: Tora Kindred on: 09/18/2021 04:37 PM   Modules accepted: Orders

## 2021-09-18 NOTE — Progress Notes (Signed)
Per Dr Irene Limbo pt ok to treat with ANC of 0.8.

## 2021-09-18 NOTE — Progress Notes (Signed)
Per Dr. Irene Limbo ok to treat with ANC of 0.8 today.

## 2021-09-19 ENCOUNTER — Ambulatory Visit
Admission: RE | Admit: 2021-09-19 | Discharge: 2021-09-19 | Disposition: A | Discharge status: Home / Self Care | Payer: 59 | Source: Ambulatory Visit | Attending: Radiation Oncology | Admitting: Radiation Oncology

## 2021-09-19 ENCOUNTER — Ambulatory Visit: Payer: Self-pay | Admitting: Urology

## 2021-09-19 DIAGNOSIS — C9002 Multiple myeloma in relapse: Secondary | ICD-10-CM

## 2021-09-19 MED ORDER — GADOBENATE DIMEGLUMINE 529 MG/ML IV SOLN
15.0000 mL | Freq: Once | INTRAVENOUS | Status: AC | PRN
  Administered 2021-09-19: 15 mL via INTRAVENOUS

## 2021-09-19 MED ORDER — SODIUM CHLORIDE 0.9% FLUSH
10.0000 mL | INTRAVENOUS | Status: DC | PRN
  Administered 2021-09-19: 10 mL via INTRAVENOUS

## 2021-09-19 MED ORDER — HEPARIN SOD (PORK) LOCK FLUSH 100 UNIT/ML IV SOLN
500.0000 [IU] | Freq: Once | INTRAVENOUS | Status: AC
  Administered 2021-09-19: 500 [IU] via INTRAVENOUS

## 2021-09-20 ENCOUNTER — Encounter: Payer: Self-pay | Admitting: Urology

## 2021-09-20 ENCOUNTER — Other Ambulatory Visit: Payer: Self-pay | Admitting: Nurse Practitioner

## 2021-09-20 ENCOUNTER — Other Ambulatory Visit (HOSPITAL_COMMUNITY): Payer: Self-pay

## 2021-09-20 ENCOUNTER — Ambulatory Visit
Admission: RE | Admit: 2021-09-20 | Discharge: 2021-09-20 | Disposition: A | Discharge status: Home / Self Care | Payer: 59 | Source: Ambulatory Visit | Attending: Urology | Admitting: Urology

## 2021-09-20 DIAGNOSIS — C9002 Multiple myeloma in relapse: Secondary | ICD-10-CM

## 2021-09-20 DIAGNOSIS — C9000 Multiple myeloma not having achieved remission: Secondary | ICD-10-CM

## 2021-09-20 MED ORDER — ATORVASTATIN CALCIUM 10 MG PO TABS
10.0000 mg | ORAL_TABLET | Freq: Every day | ORAL | 1 refills | Status: DC
  Filled 2021-09-20: qty 30, 30d supply, fill #0
  Filled 2021-10-20: qty 30, 30d supply, fill #1

## 2021-09-20 NOTE — Progress Notes (Signed)
Telephone appointment. I verified patient's identity and began nursing interview. Patient reports occasional LT frontal headaches 4/10. No other issues reported at this time.  Meaningful use complete. Postmenopausal- NO chances of pregnancy.  Reminded patient of her 1:00pm-09/20/21 telephone appointment w/ Ashlyn Bruning PA-C. I left my extension [phone] in case patient needs anything. Patient verbalized understanding.  Patient contact [phone]

## 2021-09-20 NOTE — Progress Notes (Addendum)
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   Interval Since Last Radiation:  8 weeks  06/03/21 - 06/14/21:   The recurrent tumor nodule in the left frontotemporal skull was treated to 30 Gy in 10 fractions and the cranioplasty surgical bed was simultaneously treated to 20 Gy in 10 fractions.   Narrative:  I spoke with the patient to conduct her routine scheduled 3 month follow up visit via telephone to spare the patient unnecessary potential exposure in the healthcare setting during the current COVID-19 pandemic.  The patient was notified in advance and gave permission to proceed with this visit format.  She tolerated radiation treatment relatively well without any acute ill side effects.                               On review of systems, the patient states that she is doing very well in general and is currently without complaints.  She tolerated the treatment very well with some mild alopecia as expected but otherwise denies any ill side effects associated with treatment.  She had been having some paresthesias and pain in the left foot but that resolved spontaneously.  She specifically denies changes in visual or auditory acuity, difficulty with speech, imbalance, tremors or seizure activity.  She has noticed an increased frequency of her migraine headaches, with her usual aura preceding the headache, that respond to over-the-counter pain medication as needed.  She is still grieving the loss of her daughter, having just buried her on 08/05/2021.  She continues to take it 1 day at a time. She denies any paresthesias or focal weakness.  She had a recent posttreatment brain MRI on 09/19/2021 with shows an excellent response to  treatment with the left temporalis mass no longer visualized and stable appearance of subcentimeter foci of enhancement within the calvarium with no new lesions identified.  We reviewed these results today.  ALLERGIES:  has No Known Allergies.  Meds: Current Outpatient Medications  Medication Sig Dispense Refill   atorvastatin (LIPITOR) 10 MG tablet Take 1 tablet (10 mg total) by mouth daily. 30 tablet 1   azithromycin (ZITHROMAX) 250 MG tablet Take 2 tablets by mouth daily for 1 day, then take 1 tablet daily for 4 days. 6 tablet 0   benazepril (LOTENSIN) 5 MG tablet Take 1 tablet (5 mg total) by mouth daily. 90 tablet 1   docusate sodium (COLACE) 100 MG capsule Take 100 mg by mouth 2 (two) times daily.     fluconazole (DIFLUCAN) 150 MG tablet Take 1 tablet by mouth every 3 days 2 tablet 0   hydrochlorothiazide (HYDRODIURIL) 12.5 MG tablet Take 1 tablet by mouth daily as needed for leg swelling 90 tablet 1   levocetirizine (XYZAL) 5 MG tablet TAKE 1 TABLET BY MOUTH EVERY DAY IN THE EVENING 90 tablet 3   OZEMPIC, 1 MG/DOSE, 4 MG/3ML SOPN INJECT 1 MG UNDER THE SKIN ONCE WEEKLY, ON THE SAME DAY. 9 mL 1   pomalidomide (POMALYST) 2 MG capsule TAKE 1 CAPSULE BY MOUTH ONCE DAILY FOR 21 DAYS ON AND 7 DAYS OFF 21 capsule 0   pomalidomide (POMALYST) 2 MG capsule  Take 1 capsule (2 mg total) by mouth daily. Take 1 tablet (2 mg total) by mouth daily for 21 days, then take 7 days off. 21 capsule 0   traZODone (DESYREL) 50 MG tablet TAKE 1 TO 2 TABLETS BY MOUTH AT BEDTIME AS NEEDED FOR SLEEP 180 tablet 1   valACYclovir (VALTREX) 500 MG tablet Take 1 tablet by mouth 2 times a day 60 tablet 10   No current facility-administered medications for this encounter.    Physical Findings:  vitals were not taken for this visit.  Pain Assessment Pain Score: 4  (LT frontal) Pain Loc: Head/10 Unable to assess due to telephone follow-up visit format.  Lab Findings: Lab Results  Component Value Date   WBC 2.5 (L)  09/18/2021   HGB 10.9 (L) 09/18/2021   HCT 30.6 (L) 09/18/2021   MCV 90.3 09/18/2021   PLT 165 09/18/2021     Radiographic Findings: MR Brain W Wo Contrast  Result Date: 09/20/2021 CLINICAL DATA:  Brain/CNS neoplasm, monitor 3T SRS protocol f/u of post treatment plasmacytoma EXAM: MRI HEAD WITHOUT AND WITH CONTRAST TECHNIQUE: Multiplanar, multiecho pulse sequences of the brain and surrounding structures were obtained without and with intravenous contrast. CONTRAST:  42m MULTIHANCE GADOBENATE DIMEGLUMINE 529 MG/ML IV SOLN COMPARISON:  05/03/2021 FINDINGS: Brain: Stable dural thickening and enhancement along the bone resection. No new intracranial mass or abnormal enhancement. No acute infarction or hemorrhage. Ventricles are stable in size. Gliosis in the anterior left frontal lobe underlying the resection is unchanged. Additional scattered foci of T2 hyperintensity in the supratentorial and pontine white matter probably reflect stable chronic microvascular ischemic changes. Vascular: Major vessel flow voids at the skull base are preserved. Skull and upper cervical spine: Anterior bifrontal calvarial resection with cranioplasty. There are unchanged subcentimeter foci of enhancement within the calvarium. Marrow signal is otherwise unremarkable. Sinuses/Orbits: Paranasal sinuses are aerated. Orbits are unremarkable. Other: Soft tissue lesion within the left temporalis muscle is no longer identified. Sella is unremarkable. Mastoid air cells are clear. IMPRESSION: Left temporalis mass is no longer identified. Otherwise stable appearance including subcentimeter foci of calvarial enhancement. Electronically Signed   By: PMacy MisM.D.   On: 09/20/2021 08:46    Impression/Plan: 1. 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   She has recovered well from the effects of her recent radiation and remains without complaints.  She continues to tolerate her  current systemic therapy with Isatuximab and Pomalidomide.  Her recent posttreatment brain MRI from 09/19/2021 shows an excellent response to treatment with the left temporalis mass no longer visualized and stable appearance of subcentimeter foci of enhancement within the calvarium with no new lesions identified.  We discussed the plan to continue to closely monitor with serial MRI brain scans every 3 months for the next year.  We will follow-up by telephone following each scan to review results and recommendations from the multidisciplinary brain conference.  She appears to have a good understanding of these recommendations and is comfortable and in agreement with the stated plan.  She knows that she is welcome to call at anytime with any questions or concerns in the interim.  I personally spent 30 minutes in this encounter including chart review, reviewing radiological studies, meeting face-to-face with the patient, entering orders and completing documentation.     ANicholos Johns PA-C

## 2021-09-23 NOTE — Progress Notes (Signed)
HEMATOLOGY/ONCOLOGY CLNIC NOTE  Date of Service: 09/18/2021  Patient Care Team: [name], FNP as PCP - General (General Practice)  CHIEF COMPLAINTS/PURPOSE OF CONSULTATION:  Follow-up for continued valuation and management of multiple myeloma.  HISTORY OF PRESENTING ILLNESS:   Please see previous note for details   INTERVAL HISTORY: Alexandra [name] is 56 y.o. female is here for continued valuation and management of her multiple myeloma. She reports She is doing well with no new symptoms or concerns.  She continues to be on Isatuximab and pomalidomide. Her neutropenia has not improved and her ANC is at 800. She has not switched to lower dose of Pomalidomide yet.  No reported issues with infection. No new headaches. No other new or acute focal symptoms.  She has upcoming appointment at [facility] for f/u and rpt BM Bx  Labs done today were discussed with her in detail.  ONCOLOGIC HISTORY   Lambda Light Chain Multiple Myeloma  A. 09/27/19: CT Maxillofacial enhancing dural-based anterior cranial fossa mass traversing the calvarium and extending into the extracalvarial soft tissues along with a 7.4 mm rightward midline shift at the level of anterior cranial fossa. MRI head with and without contrast is recommended for further evaluation. B. 10/05/19: MRI Head large extra-axial mass anterior left frontal region with bone erosion and extension into the scalp soft tissues. Considerations include hemangiopericytoma, metastasis, and aggressive hemangioma. 2.6 mm enhancing lesion in the clivus.  C. 10/10/19: Left craniectomy with frontal brain tumor/bone resection, path + plasma cell neoplasm, lambda restricted, plasmacytoma favored. Hgb 9.9,  D. 10/31/19: Initial Hem-Onc consultation; M-spike 0; IgG 627, IgQ 51, IgM 11; SFLC: Kappa 1.12 mg/dL, Lambda 154.50, ratio 0.01; IFE: serum + monoclonal free Lambda light chain; Hgb 10.1,  E. 11/04/19: 24 hour UPEP Total protein 1318, Free lambda light  chains Ur 1332.46, ratio 0.02, M-spike 27.4, M-spike 24 hr at 361; Bence Jones positive, Lambda type F. 11/08/19: BMBx 22% atypical plasma cells, 30% by IHC; FISH Dup(1q) and Del(13q) detected G. 11/10/19: PET CT no metabolic activity within the skeleton to localize active myeloma; no lytic lesions identified; no abnormal activity at the left craniectomy site; no evidence of soft tissue plasmacytoma. H. 12/05/19: Started KRD; SFLC: Kappa 1.42, Lambda 177.97, ratio 0.01; Hgb 11.0 I. 12//13/21: SFLC: Kappa 1.45, Lambda 8.92, ratio 0.16; Hgb 11.1 J. 02/06/20: SFLC: Kappa 1.10, Lambda 2.08, ratio 0.53; Hgb 11.3 K. 02/26/20: Cycle 4 of KRD; SFLC: Kappa 1.14, Lambda 1.90, ratio 0.60; Hgb 10.8 L. 04/02/20: SFLC: Kappa 1.05, Lambda 1.56, ratio 0.67; Hgb 11.2 M. 04/30/20: SFLC: Kappa 1.01, Lambda 1.40, ratio 0.72; Hgb 11.5  N. 05/05/20: MRI Brain no evidence of left frontal bone lesion recurrence; Multiple subcentimeter area of nodular enhancement in the calvarium are nonspecific but could related to myeloma given nonvisualization on prior. O. 05/11/20: PET CT no findings to suggest FDG avid lesions of multiple myeloma. P. 05/14/20: BMBx 1% plasma cells Q. 05/28/20: SFLC: Kappa 0.84, Lambda 1.16, ratio 0.72; Hgb 11.6  R. 06/11/20: EKG QT 408/QTc 424; PFT's: FEV1 110%, DLCO 58.8% predicted, 64.4% hgb corrected; ECHO: EF 55% est, 61% calc; GLS -20%; SFLC: Kappa 1.16, Lambda 1.40, ratio 0.83; Hgb 10.9-VGPR S. 07/06/20: SFLC: Kappa 1.16, Lambda 2.14, ratio 0.54; . Hgb 11.7, Plt 118 T. 6/20-7/12/22: Hospital admission for Melphalan 200 mg/m2 followed by reinfusion of 4.71 x 10^6/kg CD34+ autologous cells  MEDICAL HISTORY:  Past Medical History:  Diagnosis Date   Anxiety    Hypertension    Pre-diabetes  SURGICAL HISTORY: Past Surgical History:  Procedure Laterality Date   ABLATION     APPLICATION OF CRANIAL NAVIGATION N/A 10/10/2019   Procedure: APPLICATION OF CRANIAL NAVIGATION;  Surgeon: Consuella Lose,  MD;  Location: [place];  Service: Neurosurgery;  Laterality: N/A;  APPLICATION OF CRANIAL NAVIGATION   CRANIOPLASTY Left 10/14/2019   Procedure: LEFT CRANIOPLASTY WITH PLACEMENT OF CUSTOM CRANIAL IMPLANT;  Surgeon: Consuella Lose, MD;  Location: [place];  Service: Neurosurgery;  Laterality: Left;   CRANIOTOMY Left 10/10/2019   Procedure: Left frontal Stereotactic craniectomy for resection of tumor;  Surgeon: Consuella Lose, MD;  Location: [place];  Service: Neurosurgery;  Laterality: Left;  Left frontal Stereotactic craniectomy for resection of tumor   HAMMER TOE SURGERY Left    IR IMAGING GUIDED PORT INSERTION  12/01/2019   IR IMAGING GUIDED PORT INSERTION  08/06/2021   TUBAL LIGATION      SOCIAL HISTORY: Social History   Socioeconomic History   Marital status: Single    Spouse name: Not on file   Number of children: Not on file   Years of education: Not on file   Highest education level: 12th grade  Occupational History   Not on file  Tobacco Use   Smoking status: Never   Smokeless tobacco: Never  Vaping Use   Vaping Use: Never used  Substance and Sexual Activity   Alcohol use: Yes    Comment: occassionally   Drug use: Not Currently   Sexual activity: Not on file  Other Topics Concern   Not on file  Social History Narrative   Not on file   Social Determinants of Health   Financial Resource Strain: Medium Risk (08/13/2020)   Overall Financial Resource Strain (CARDIA)    Difficulty of Paying Living Expenses: Somewhat hard  Food Insecurity: Food Insecurity Present (08/13/2020)   Hunger Vital Sign    Worried About Running Out of Food in the Last Year: Sometimes true    Ran Out of Food in the Last Year: Never true  Transportation Needs: Unmet Transportation Needs (08/13/2020)   PRAPARE - Transportation    Lack of Transportation (Medical): No    Lack of Transportation (Non-Medical): Yes  Physical Activity: Sufficiently Active (08/13/2020)   Exercise Vital Sign    Days of  Exercise per Week: 5 days    Minutes of Exercise per Session: 30 min  Stress: No Stress Concern Present (08/13/2020)   [employer] [place]    Feeling of Stress : Only a little  Social Connections: Moderately Integrated (08/13/2020)   Social Connection and Isolation Panel [NHANES]    Frequency of Communication with Friends and Family: More than three times a week    Frequency of Social Gatherings with Friends and Family: Once a week    Attends Religious Services: More than 4 times per year    Active Member of [employer] or Organizations: Yes    Attends Archivist Meetings: 1 to 4 times per year    Marital Status: Divorced  Human resources officer Violence: Not on file    FAMILY HISTORY: Family History  Problem Relation Age of Onset   Breast cancer Maternal Grandmother    Heart disease Mother    Hypertension Mother    Sarcoidosis Father    Diabetes Maternal Grandfather     ALLERGIES:  has No Known Allergies.  MEDICATIONS:  Current Outpatient Medications  Medication Sig Dispense Refill   atorvastatin (LIPITOR) 10 MG tablet Take 1 tablet (10  mg total) by mouth daily. 30 tablet 1   azithromycin (ZITHROMAX) 250 MG tablet Take 2 tablets by mouth daily for 1 day, then take 1 tablet daily for 4 days. 6 tablet 0   benazepril (LOTENSIN) 5 MG tablet Take 1 tablet (5 mg total) by mouth daily. 90 tablet 1   docusate sodium (COLACE) 100 MG capsule Take 100 mg by mouth 2 (two) times daily.     fluconazole (DIFLUCAN) 150 MG tablet Take 1 tablet by mouth every 3 days 2 tablet 0   hydrochlorothiazide (HYDRODIURIL) 12.5 MG tablet Take 1 tablet by mouth daily as needed for leg swelling 90 tablet 1   levocetirizine (XYZAL) 5 MG tablet TAKE 1 TABLET BY MOUTH EVERY DAY IN THE EVENING 90 tablet 3   OZEMPIC, 1 MG/DOSE, 4 MG/3ML SOPN INJECT 1 MG UNDER THE SKIN ONCE WEEKLY, ON THE SAME DAY. 9 mL 1   pomalidomide (POMALYST) 2 MG capsule TAKE 1 CAPSULE BY  MOUTH ONCE DAILY FOR 21 DAYS ON AND 7 DAYS OFF 21 capsule 0   pomalidomide (POMALYST) 2 MG capsule Take 1 capsule (2 mg total) by mouth daily. Take 1 tablet (2 mg total) by mouth daily for 21 days, then take 7 days off. 21 capsule 0   traZODone (DESYREL) 50 MG tablet TAKE 1 TO 2 TABLETS BY MOUTH AT BEDTIME AS NEEDED FOR SLEEP 180 tablet 1   valACYclovir (VALTREX) 500 MG tablet Take 1 tablet by mouth 2 times a day 60 tablet 10   No current facility-administered medications for this visit.    REVIEW OF SYSTEMS:   10 Point review of Systems was done is negative except as noted above.   PHYSICAL EXAMINATION: ECOG PERFORMANCE STATUS: 1 - Symptomatic but completely ambulatory Vitals:   09/18/21 1402  BP: 91/61  Pulse: 61  Resp: 17  Temp: (!) 97.5 F (36.4 C)  SpO2: 100%   NAD GENERAL:alert, in no acute distress and comfortable SKIN: no acute rashes, no significant lesions EYES: conjunctiva are pink and non-injected, sclera anicteric NECK: supple, no JVD LYMPH:  no palpable lymphadenopathy in the cervical, axillary or inguinal regions LUNGS: clear to auscultation b/l with normal respiratory effort HEART: regular rate & rhythm ABDOMEN:  normoactive bowel sounds , non tender, not distended. Extremity: no pedal edema PSYCH: alert & oriented x 3 with fluent speech NEURO: no focal motor/sensory deficits  Exam performed in chair.  LABORATORY DATA:  I have reviewed the data as listed     Latest Ref Rng & Units 09/18/2021    1:44 PM 09/04/2021    2:19 PM 08/23/2021   12:48 PM  CBC  WBC 4.0 - 10.5 K/uL 2.5  2.8  2.1   Hemoglobin 12.0 - 15.0 g/dL 10.9  11.3  11.1   Hematocrit 36.0 - 46.0 % 30.6  32.1  31.8   Platelets 150 - 400 K/uL 165  230  181        Latest Ref Rng & Units 09/18/2021    1:44 PM 09/04/2021    2:19 PM 08/23/2021   12:48 PM  CMP  Glucose 70 - 99 mg/dL 109  106  89   BUN 6 - 20 mg/dL 20  15  19    Creatinine 0.44 - 1.00 mg/dL 0.78  0.78  0.77   Sodium 135 - 145  mmol/L 142  140  140   Potassium 3.5 - 5.1 mmol/L 3.5  3.9  4.1   Chloride 98 - 111 mmol/L 105  109  105   CO2 22 - 32 mmol/L 29  27  30    Calcium 8.9 - 10.3 mg/dL 9.5  9.2  9.2   Total Protein 6.5 - 8.1 g/dL 6.3  6.2  6.3   Total Bilirubin 0.3 - 1.2 mg/dL 0.7  0.4  0.9   Alkaline Phos 38 - 126 U/L 37  38  42   AST 15 - 41 U/L 15  12  11    ALT 0 - 44 U/L 15  13  11     11/08/2019 FISH Plasma Cell Myeloma Prognostic Panel:    11/08/2019 Cytogenetics:    11/08/2019 Bone Marrow Report [phone]):   10/10/2019 Surgical Pathology [phone]):   05/14/2020 Surgical Pathology "-Normocellular bone marrow for age with trilineage hematopoiesis and 1%  plasma cells  -See comment   PERIPHERAL BLOOD:  -Normocytic-normochromic anemia  -Leukopenia   COMMENT:   The bone marrow is generally normocellular for age with trilineage  hematopoiesis but with predominance of erythroid precursors.  The  myeloid changes are not considered specific and likely related to  therapy.  In this background, the plasma cells represent 1% of all cells  associated with scattered small clusters and display slight lambda light  chain excess.  The findings are limited but most suggestive of minimal  residual plasma cell neoplasm.  Correlation with cytogenetic and FISH  studies is recommended. "  RADIOGRAPHIC STUDIES:  I have personally reviewed the radiological images as listed and agreed with the findings in the report. MR Brain W Wo Contrast  Result Date: 09/20/2021 CLINICAL DATA:  Brain/CNS neoplasm, monitor 3T SRS protocol f/u of post treatment plasmacytoma EXAM: MRI HEAD WITHOUT AND WITH CONTRAST TECHNIQUE: Multiplanar, multiecho pulse sequences of the brain and surrounding structures were obtained without and with intravenous contrast. CONTRAST:  60m MULTIHANCE GADOBENATE DIMEGLUMINE 529 MG/ML IV SOLN COMPARISON:  05/03/2021 FINDINGS: Brain: Stable dural thickening and enhancement along the bone  resection. No new intracranial mass or abnormal enhancement. No acute infarction or hemorrhage. Ventricles are stable in size. Gliosis in the anterior left frontal lobe underlying the resection is unchanged. Additional scattered foci of T2 hyperintensity in the supratentorial and pontine white matter probably reflect stable chronic microvascular ischemic changes. Vascular: Major vessel flow voids at the skull base are preserved. Skull and upper cervical spine: Anterior bifrontal calvarial resection with cranioplasty. There are unchanged subcentimeter foci of enhancement within the calvarium. Marrow signal is otherwise unremarkable. Sinuses/Orbits: Paranasal sinuses are aerated. Orbits are unremarkable. Other: Soft tissue lesion within the left temporalis muscle is no longer identified. Sella is unremarkable. Mastoid air cells are clear. IMPRESSION: Left temporalis mass is no longer identified. Otherwise stable appearance including subcentimeter foci of calvarial enhancement. Electronically Signed   By: PMacy MisM.D.   On: 09/20/2021 08:46     ASSESSMENT & PLAN:   56yo with multiple Myeloma status post transplant in complete remission on maintenance Revlimid here for follow-up  1) Multiple myeloma presenting with Cranial plasma cell neoplasm-  Plasmacytoma s/p resection and cranioplasty -- no residual disease based on PET/CT 2) Multiple myeloma . No M spike. Lambda light chain myeloma + Anemia + bone lesion on skull-- resected No renal failure, hypercalcemia  BMBx- 22-30% lambda restricted plasma cell MolCy Dup (1q), del (13q)  Completed 7 cycles of KRD  The pt recently had a transplant at [facility] Lambda Light Chain Multiple Myeloma  - She will receive Melphalan 200 mg/m2 conditioning regimen, followed by autologous stem cell rescue (  see details below).  TRANSPLANT: Preparative regimen:  - Melphalan 200 mg/m2 (39m) IV: 07/09/20  Stem Cells:  - Autologous stem cell reinfusion on  07/10/20. Patient received 4.71 x 10^6 CD34+/kg. - Her course was complicated by neutropenic fevers and Bcx 6/27 positive for E coli. She received IV cefepime from 6/27 until 6/30, which cefepime was switched to zosyn in the setting of new abdominal pain. CT abdomen/pelvis was performed that showed acute uncomplicated multifocal diverticulitis involving the transverse and descending colon. She was treated with bowel rest, prn pain medication, and IV zosyn, which was continued until engraftment on 7/5 and was transitioned to ppx ceftriaxone. Unfortunately, with the transition to ceftriaxone she had recurrent fevers and as a result was restarted on zosyn. Zosyn was transitioned to oral cipro/flagyl on 7/10 with the plan to continue for a 7 day course.    Bone marrow biopsy done on 01/18/2021 showed normocellular bone marrow with no morphologic evidence of plasma cell neoplasm. 24-hour UPEP on 01/22/2021 showed unremarkable immunofixation pattern and no monoclonal protein. PET CT scan 01/16/2021-negative for FDG avid lesions of myeloma MRI brain 01/12/2021-showed no overt active myeloma lesions.  3) prediabetes 4) hypertension 5) anxiety 6) dyslipidemia  PLAN: -Patient's labs from today were discussed with her in detail. -She will continue her Isatuximab as per orders. -We will cut down her pomalidomide to 2 mg p.o. daily in the context of some diarrhea and muscle cramping and significant neutropenia. Her WBC and neutrophil counts have not improved. Patient reports no notable toxicities from her new regimen with Isatuximab and Pomalidomide. -We shall continue her same regimen of supportive medications. -Orders were placed and signed.. -Continue maintenance Zometa every 3 months. -Continue prophylaxis with baby aspirin Valtrex and B complex. -[facility]  Followup Please schedule next 2 cycles of Isatuximab. Labs with each treatment MD visit every 4 weeks. Zometa every 3  months please schedule next 2 doses  The total time spent in the appointment was 30 minutes*.  All of the patient's questions were answered with apparent satisfaction. The patient knows to call the clinic with any problems, questions or concerns.   GSullivan LoneMD MS AAHIVMS SLas Colin[facility] Surgery Center LtdCSurgery Center Of [facility] LLCHematology/Oncology Physician [facility] .*Total Encounter Time as defined by the Centers for Medicare and Medicaid Services includes, in addition to the face-to-face time of a patient visit (documented in the note above) non-face-to-face time: obtaining and reviewing outside history, ordering and reviewing medications, tests or procedures, care coordination (communications with other health care professionals or caregivers) and documentation in the medical record.  I, GMelene Muller am acting as scribe for Dr. GSullivan Lone MD.  .I have reviewed the above documentation for accuracy and completeness, and I agree with the above..Brunetta GeneraMD

## 2021-09-24 ENCOUNTER — Other Ambulatory Visit: Payer: Self-pay | Admitting: Radiation Therapy

## 2021-09-24 ENCOUNTER — Other Ambulatory Visit (HOSPITAL_COMMUNITY): Payer: Self-pay

## 2021-09-24 DIAGNOSIS — C9030 Solitary plasmacytoma not having achieved remission: Secondary | ICD-10-CM

## 2021-09-25 ENCOUNTER — Encounter: Payer: Self-pay | Admitting: Hematology

## 2021-09-27 ENCOUNTER — Encounter: Payer: Self-pay | Admitting: General Practice

## 2021-09-27 NOTE — Progress Notes (Signed)
[place] Spiritual Care Note  Made pastoral check-in phone call to Alexandra [name], who notes that her faith is really helping to sustain her through her grief at her daughter's death and her ongoing cancer treatment.  Alexandra [name] plans to see her counselor for an appointment next week and then to phone Spiritual Care to set up an appointment for spiritually-oriented grief support.  Also scheduled her for [facility] at 12:30 pm on Friday 10/11/2021 per her request.    Pauline Good, [facility] Pager [phone] Voicemail [phone]

## 2021-10-02 ENCOUNTER — Other Ambulatory Visit: Payer: Self-pay

## 2021-10-02 ENCOUNTER — Inpatient Hospital Stay: Payer: 59 | Attending: Hematology

## 2021-10-02 ENCOUNTER — Inpatient Hospital Stay: Payer: 59

## 2021-10-02 VITALS — BP 121/61 | HR 64 | Temp 98.4°F | Resp 18 | Wt 169.5 lb

## 2021-10-02 DIAGNOSIS — Z7189 Other specified counseling: Secondary | ICD-10-CM

## 2021-10-02 DIAGNOSIS — C9001 Multiple myeloma in remission: Secondary | ICD-10-CM | POA: Insufficient documentation

## 2021-10-02 DIAGNOSIS — C7951 Secondary malignant neoplasm of bone: Secondary | ICD-10-CM

## 2021-10-02 DIAGNOSIS — C9000 Multiple myeloma not having achieved remission: Secondary | ICD-10-CM

## 2021-10-02 DIAGNOSIS — Z95828 Presence of other vascular implants and grafts: Secondary | ICD-10-CM

## 2021-10-02 DIAGNOSIS — Z5112 Encounter for antineoplastic immunotherapy: Secondary | ICD-10-CM | POA: Insufficient documentation

## 2021-10-02 LAB — CBC WITH DIFFERENTIAL (CANCER CENTER ONLY)
Abs Immature Granulocytes: 0 10*3/uL (ref 0.00–0.07)
Basophils Absolute: 0.1 10*3/uL (ref 0.0–0.1)
Basophils Relative: 2 %
Eosinophils Absolute: 0.2 10*3/uL (ref 0.0–0.5)
Eosinophils Relative: 6 %
HCT: 31 % — ABNORMAL LOW (ref 36.0–46.0)
Hemoglobin: 10.5 g/dL — ABNORMAL LOW (ref 12.0–15.0)
Immature Granulocytes: 0 %
Lymphocytes Relative: 30 %
Lymphs Abs: 0.9 10*3/uL (ref 0.7–4.0)
MCH: 31.5 pg (ref 26.0–34.0)
MCHC: 33.9 g/dL (ref 30.0–36.0)
MCV: 93.1 fL (ref 80.0–100.0)
Monocytes Absolute: 0.3 10*3/uL (ref 0.1–1.0)
Monocytes Relative: 11 %
Neutro Abs: 1.5 10*3/uL — ABNORMAL LOW (ref 1.7–7.7)
Neutrophils Relative %: 51 %
Platelet Count: 272 10*3/uL (ref 150–400)
RBC: 3.33 MIL/uL — ABNORMAL LOW (ref 3.87–5.11)
RDW: 16.1 % — ABNORMAL HIGH (ref 11.5–15.5)
WBC Count: 2.9 10*3/uL — ABNORMAL LOW (ref 4.0–10.5)
nRBC: 0 % (ref 0.0–0.2)

## 2021-10-02 MED ORDER — MONTELUKAST SODIUM 10 MG PO TABS
10.0000 mg | ORAL_TABLET | Freq: Once | ORAL | Status: AC
  Administered 2021-10-02: 10 mg via ORAL
  Filled 2021-10-02: qty 1

## 2021-10-02 MED ORDER — SODIUM CHLORIDE 0.9 % IV SOLN
10.0000 mg/kg | Freq: Once | INTRAVENOUS | Status: AC
  Administered 2021-10-02: 800 mg via INTRAVENOUS
  Filled 2021-10-02: qty 25

## 2021-10-02 MED ORDER — SODIUM CHLORIDE 0.9% FLUSH
10.0000 mL | Freq: Once | INTRAVENOUS | Status: AC
  Administered 2021-10-02: 10 mL

## 2021-10-02 MED ORDER — ACETAMINOPHEN 325 MG PO TABS
650.0000 mg | ORAL_TABLET | Freq: Once | ORAL | Status: AC
  Administered 2021-10-02: 650 mg via ORAL
  Filled 2021-10-02: qty 2

## 2021-10-02 MED ORDER — HEPARIN SOD (PORK) LOCK FLUSH 100 UNIT/ML IV SOLN
500.0000 [IU] | Freq: Once | INTRAVENOUS | Status: AC | PRN
  Administered 2021-10-02: 500 [IU]

## 2021-10-02 MED ORDER — FAMOTIDINE IN NACL 20-0.9 MG/50ML-% IV SOLN
20.0000 mg | Freq: Once | INTRAVENOUS | Status: AC
  Administered 2021-10-02: 20 mg via INTRAVENOUS
  Filled 2021-10-02: qty 50

## 2021-10-02 MED ORDER — SODIUM CHLORIDE 0.9% FLUSH
10.0000 mL | INTRAVENOUS | Status: DC | PRN
  Administered 2021-10-02: 10 mL

## 2021-10-02 MED ORDER — SODIUM CHLORIDE 0.9 % IV SOLN: Freq: Once | INTRAVENOUS | Status: AC

## 2021-10-02 MED ORDER — DIPHENHYDRAMINE HCL 50 MG/ML IJ SOLN
50.0000 mg | Freq: Once | INTRAMUSCULAR | Status: AC
  Administered 2021-10-02: 50 mg via INTRAVENOUS
  Filled 2021-10-02: qty 1

## 2021-10-02 MED ORDER — SODIUM CHLORIDE 0.9 % IV SOLN
40.0000 mg | Freq: Once | INTRAVENOUS | Status: AC
  Administered 2021-10-02: 40 mg via INTRAVENOUS
  Filled 2021-10-02: qty 4

## 2021-10-02 NOTE — Patient Instructions (Signed)

## 2021-10-02 NOTE — Patient Instructions (Signed)
[facility] ONCOLOGY   Discharge Instructions: Thank you for choosing [facility] to provide your oncology and hematology care.   If you have a lab appointment with the [facility], please go directly to the [facility] and check in at the registration area.   Wear comfortable clothing and clothing appropriate for easy access to any Portacath or PICC line.   We strive to give you quality time with your provider. You may need to reschedule your appointment if you arrive late (15 or more minutes).  Arriving late affects you and other patients whose appointments are after yours.  Also, if you miss three or more appointments without notifying the office, you may be dismissed from the clinic at the provider's discretion.      For prescription refill requests, have your pharmacy contact our office and allow 72 hours for refills to be completed.    Today you received the following chemotherapy and/or immunotherapy agents: isatuximab      To help prevent nausea and vomiting after your treatment, we encourage you to take your nausea medication as directed.  BELOW ARE SYMPTOMS THAT SHOULD BE REPORTED IMMEDIATELY: *FEVER GREATER THAN 100.4 F (38 C) OR HIGHER *CHILLS OR SWEATING *NAUSEA AND VOMITING THAT IS NOT CONTROLLED WITH YOUR NAUSEA MEDICATION *UNUSUAL SHORTNESS OF BREATH *UNUSUAL BRUISING OR BLEEDING *URINARY PROBLEMS (pain or burning when urinating, or frequent urination) *BOWEL PROBLEMS (unusual diarrhea, constipation, pain near the anus) TENDERNESS IN MOUTH AND THROAT WITH OR WITHOUT PRESENCE OF ULCERS (sore throat, sores in mouth, or a toothache) UNUSUAL RASH, SWELLING OR PAIN  UNUSUAL VAGINAL DISCHARGE OR ITCHING   Items with * indicate a potential emergency and should be followed up as soon as possible or go to the Emergency Department if any problems should occur.  Please show the CHEMOTHERAPY ALERT CARD or IMMUNOTHERAPY ALERT CARD at check-in  to the Emergency Department and triage nurse.  Should you have questions after your visit or need to cancel or reschedule your appointment, please contact [facility]  Dept: [phone]  and follow the prompts.  Office hours are 8:00 a.m. to 4:30 p.m. Monday - Friday. Please note that voicemails left after 4:00 p.m. may not be returned until the following business day.  We are closed weekends and major holidays. You have access to a nurse at all times for urgent questions. Please call the main number to the clinic Dept: [phone] and follow the prompts.   For any non-urgent questions, you may also contact your provider using MyChart. We now offer e-Visits for anyone 20 and older to request care online for non-urgent symptoms. For details visit [email or web address].   Also download the MyChart app! Go to the app store, search "MyChart", open the app, select [facility], and log in with your MyChart username and password.  Masks are optional in the cancer centers. If you would like for your care team to wear a mask while they are taking care of you, please let them know. You may have one support person who is at least 58 years old accompany you for your appointments.

## 2021-10-04 ENCOUNTER — Other Ambulatory Visit: Payer: Self-pay | Admitting: Radiation Therapy

## 2021-10-04 ENCOUNTER — Other Ambulatory Visit: Payer: Self-pay | Admitting: Hematology

## 2021-10-11 ENCOUNTER — Inpatient Hospital Stay: Payer: 59

## 2021-10-11 NOTE — Progress Notes (Signed)
[facility] Social Work  Patient presented to [facility]  to review and complete healthcare advance directives.  Clinical Social Worker met with patient.  The patient designated Alexandra [name] as their primary healthcare agent and [name] as their secondary agent.  Patient also completed healthcare living will.    Documents were notarized and copies made for patient/family. Clinical Social Worker will send documents to medical records to be scanned into patient's chart. Clinical Social Worker encouraged patient/family to contact with any additional questions or concerns.   [name], LCSW Clinical Social Worker [facility]

## 2021-10-17 ENCOUNTER — Other Ambulatory Visit: Payer: Self-pay | Admitting: Hematology

## 2021-10-17 DIAGNOSIS — C9000 Multiple myeloma not having achieved remission: Secondary | ICD-10-CM

## 2021-10-17 DIAGNOSIS — Z7189 Other specified counseling: Secondary | ICD-10-CM

## 2021-10-17 DIAGNOSIS — C7951 Secondary malignant neoplasm of bone: Secondary | ICD-10-CM

## 2021-10-21 ENCOUNTER — Other Ambulatory Visit (HOSPITAL_COMMUNITY): Payer: Self-pay

## 2021-10-30 ENCOUNTER — Other Ambulatory Visit: Payer: Self-pay

## 2021-10-30 ENCOUNTER — Inpatient Hospital Stay: Payer: 59

## 2021-10-30 ENCOUNTER — Inpatient Hospital Stay (HOSPITAL_BASED_OUTPATIENT_CLINIC_OR_DEPARTMENT_OTHER): Payer: 59 | Admitting: Hematology

## 2021-10-30 ENCOUNTER — Inpatient Hospital Stay: Payer: 59 | Attending: Hematology

## 2021-10-30 VITALS — BP 115/74 | HR 70 | Resp 14

## 2021-10-30 DIAGNOSIS — C7951 Secondary malignant neoplasm of bone: Secondary | ICD-10-CM

## 2021-10-30 DIAGNOSIS — Z79899 Other long term (current) drug therapy: Secondary | ICD-10-CM | POA: Insufficient documentation

## 2021-10-30 DIAGNOSIS — Z95828 Presence of other vascular implants and grafts: Secondary | ICD-10-CM

## 2021-10-30 DIAGNOSIS — C9000 Multiple myeloma not having achieved remission: Secondary | ICD-10-CM

## 2021-10-30 DIAGNOSIS — C9001 Multiple myeloma in remission: Secondary | ICD-10-CM | POA: Diagnosis present

## 2021-10-30 DIAGNOSIS — Z5112 Encounter for antineoplastic immunotherapy: Secondary | ICD-10-CM | POA: Diagnosis not present

## 2021-10-30 DIAGNOSIS — Z7189 Other specified counseling: Secondary | ICD-10-CM

## 2021-10-30 LAB — CBC WITH DIFFERENTIAL (CANCER CENTER ONLY)
Abs Immature Granulocytes: 0.01 10*3/uL (ref 0.00–0.07)
Basophils Absolute: 0.1 10*3/uL (ref 0.0–0.1)
Basophils Relative: 2 %
Eosinophils Absolute: 0.2 10*3/uL (ref 0.0–0.5)
Eosinophils Relative: 7 %
HCT: 33.1 % — ABNORMAL LOW (ref 36.0–46.0)
Hemoglobin: 11.5 g/dL — ABNORMAL LOW (ref 12.0–15.0)
Immature Granulocytes: 0 %
Lymphocytes Relative: 37 %
Lymphs Abs: 1 10*3/uL (ref 0.7–4.0)
MCH: 31.5 pg (ref 26.0–34.0)
MCHC: 34.7 g/dL (ref 30.0–36.0)
MCV: 90.7 fL (ref 80.0–100.0)
Monocytes Absolute: 0.5 10*3/uL (ref 0.1–1.0)
Monocytes Relative: 17 %
Neutro Abs: 1 10*3/uL — ABNORMAL LOW (ref 1.7–7.7)
Neutrophils Relative %: 37 %
Platelet Count: 300 10*3/uL (ref 150–400)
RBC: 3.65 MIL/uL — ABNORMAL LOW (ref 3.87–5.11)
RDW: 15.8 % — ABNORMAL HIGH (ref 11.5–15.5)
WBC Count: 2.7 10*3/uL — ABNORMAL LOW (ref 4.0–10.5)
nRBC: 0 % (ref 0.0–0.2)

## 2021-10-30 LAB — CMP (CANCER CENTER ONLY)
ALT: 12 U/L (ref 0–44)
AST: 19 U/L (ref 15–41)
Albumin: 3.8 g/dL (ref 3.5–5.0)
Alkaline Phosphatase: 53 U/L (ref 38–126)
Anion gap: 3 — ABNORMAL LOW (ref 5–15)
BUN: 11 mg/dL (ref 6–20)
CO2: 27 mmol/L (ref 22–32)
Calcium: 8.4 mg/dL — ABNORMAL LOW (ref 8.9–10.3)
Chloride: 110 mmol/L (ref 98–111)
Creatinine: 0.74 mg/dL (ref 0.44–1.00)
GFR, Estimated: >60 mL/min (ref >60)
Glucose, Bld: 120 mg/dL — ABNORMAL HIGH (ref 70–99)
Potassium: 3.7 mmol/L (ref 3.5–5.1)
Sodium: 140 mmol/L (ref 135–145)
Total Bilirubin: 0.5 mg/dL (ref 0.3–1.2)
Total Protein: 5.9 g/dL — ABNORMAL LOW (ref 6.5–8.1)

## 2021-10-30 MED ORDER — MONTELUKAST SODIUM 10 MG PO TABS
10.0000 mg | ORAL_TABLET | Freq: Once | ORAL | Status: AC
  Administered 2021-10-30: 10 mg via ORAL
  Filled 2021-10-30: qty 1

## 2021-10-30 MED ORDER — FAMOTIDINE IN NACL 20-0.9 MG/50ML-% IV SOLN
20.0000 mg | Freq: Once | INTRAVENOUS | Status: AC
  Administered 2021-10-30: 20 mg via INTRAVENOUS
  Filled 2021-10-30: qty 50

## 2021-10-30 MED ORDER — ACETAMINOPHEN 325 MG PO TABS
650.0000 mg | ORAL_TABLET | Freq: Once | ORAL | Status: AC
  Administered 2021-10-30: 650 mg via ORAL
  Filled 2021-10-30: qty 2

## 2021-10-30 MED ORDER — SODIUM CHLORIDE 0.9% FLUSH
10.0000 mL | Freq: Once | INTRAVENOUS | Status: AC
  Administered 2021-10-30: 10 mL

## 2021-10-30 MED ORDER — DIPHENHYDRAMINE HCL 50 MG/ML IJ SOLN
50.0000 mg | Freq: Once | INTRAMUSCULAR | Status: AC
  Administered 2021-10-30: 50 mg via INTRAVENOUS
  Filled 2021-10-30: qty 1

## 2021-10-30 MED ORDER — SODIUM CHLORIDE 0.9% FLUSH
10.0000 mL | INTRAVENOUS | Status: DC | PRN
  Administered 2021-10-30: 10 mL

## 2021-10-30 MED ORDER — SODIUM CHLORIDE 0.9 % IV SOLN
10.0000 mg/kg | Freq: Once | INTRAVENOUS | Status: AC
  Administered 2021-10-30: 800 mg via INTRAVENOUS
  Filled 2021-10-30: qty 25

## 2021-10-30 MED ORDER — SODIUM CHLORIDE 0.9 % IV SOLN: Freq: Once | INTRAVENOUS | Status: AC

## 2021-10-30 MED ORDER — HEPARIN SOD (PORK) LOCK FLUSH 100 UNIT/ML IV SOLN
500.0000 [IU] | Freq: Once | INTRAVENOUS | Status: AC | PRN
  Administered 2021-10-30: 500 [IU]

## 2021-10-30 MED ORDER — SODIUM CHLORIDE 0.9 % IV SOLN
40.0000 mg | Freq: Once | INTRAVENOUS | Status: AC
  Administered 2021-10-30: 40 mg via INTRAVENOUS
  Filled 2021-10-30: qty 4

## 2021-10-30 NOTE — Patient Instructions (Signed)
[facility]COLOG[facility]  Discharge Instructions: Thank you for choosing [facility] to provide your oncology and hematology care.   If you have a lab appointment with the Cancer Center, please go directly to the Cancer Center and check in at the registration area.   Wear comfortable clothing and clothing appropriate for easy access to any Portacath or PICC line.   We strive to give you quality time with your provider. You may need to reschedule your appointment if you arrive late (15 or more minutes).  Arriving late affects you and other patients whose appointments are after yours.  Also, if you miss three or more appointments without notifying the office, you may be dismissed from the clinic at the provider's discretion.      For prescription refill requests, have your pharmacy contact our office and allow 72 hours for refills to be completed.    Today you received the following chemotherapy and/or immunotherapy agents: Sarclisa      To help prevent nausea and vomiting after your treatment, we encourage you to take your nausea medication as directed.  BELOW ARE SYMPTOMS THAT SHOULD BE REPORTED IMMEDIATELY: *FEVER GREATER THAN 100.4 F (38 C) OR HIGHER *CHILLS OR SWEATING *NAUSEA AND VOMITING THAT IS NOT CONTROLLED WITH YOUR NAUSEA MEDICATION *UNUSUAL SHORTNESS OF BREATH *UNUSUAL BRUISING OR BLEEDING *URINARY PROBLEMS (pain or burning when urinating, or frequent urination) *BOWEL PROBLEMS (unusual diarrhea, constipation, pain near the anus) TENDERNESS IN MOUTH AND THROAT WITH OR WITHOUT PRESENCE OF ULCERS (sore throat, sores in mouth, or a toothache) UNUSUAL RASH, SWELLING OR PAIN  UNUSUAL VAGINAL DISCHARGE OR ITCHING   Items with * indicate a potential emergency and should be followed up as soon as possible or go to the Emergency Department if any problems should occur.  Please show the CHEMOTHERAPY ALERT CARD or IMMUNOTHERAPY ALERT CARD at check-in to  the Emergency Department and triage nurse.  Should you have questions after your visit or need to cancel or reschedule your appointment, please contact [facility]  Dept: [phone]  and follow the prompts.  Office hours are 8:00 a.m. to 4:30 p.m. Monday - Friday. Please note that voicemails left after 4:00 p.m. may not be returned until the following business day.  We are closed weekends and major holidays. You have access to a nurse at all times for urgent questions. Please call the main number to the clinic Dept: [phone] and follow the prompts.   For any non-urgent questions, you may also contact your provider using MyChart. We now offer e-Visits for anyone 18 and older to request care online for non-urgent symptoms. For details visit [email or web address].   Also download the MyChart app! Go to the app store, search "MyChart", open the app, select [facility], and log in with your MyChart username and password.  Masks are optional in the cancer centers. If you would like for your care team to wear a mask while they are taking care of you, please let them know. You may have one support person who is at least 58 years old accompany you for your appointments. 

## 2021-10-30 NOTE — Progress Notes (Signed)
Per Dr. Irene Limbo OK to proceed with tx with ANC 1.0 K/uL today

## 2021-10-30 NOTE — Progress Notes (Signed)
HEMATOLOGY/ONCOLOGY CLNIC NOTE  Date of Service: 10/30/21   Patient Care Team: [name], FNP as PCP - General (General Practice)  CHIEF COMPLAINTS/PURPOSE OF CONSULTATION:  Follow-up for continued valuation and management of multiple myeloma.  HISTORY OF PRESENTING ILLNESS:   Please see previous note for details   INTERVAL HISTORY: Alexandra [name] is here for continued valuation and management of her multiple myeloma.    She was last seen by me on 08/23/2021 and was doing well.   She denies any new symptoms and infection with her myeloma. She reports she had her PET scan as well as bone marrow pathology on 10/14/21. She is doing well overall during this visit.  She regularly exercises and her appetite has been good. She notes that she has gained weight.  She notes that she had a recent fall, without any injuries. However, she has mild hip/thigh pain due to the fall.    She recently received the influenza vaccine and will schedule for the COVID-19 booster.  She reports she is planning on traveling to [place] on the 11/16/2021 and traveling to [place] in November.   ONCOLOGIC HISTORY   Lambda Light Chain Multiple Myeloma  A. 09/27/19: CT Maxillofacial enhancing dural-based anterior cranial fossa mass traversing the calvarium and extending into the extracalvarial soft tissues along with a 7.4 mm rightward midline shift at the level of anterior cranial fossa. MRI head with and without contrast is recommended for further evaluation. B. 10/05/19: MRI Head large extra-axial mass anterior left frontal region with bone erosion and extension into the scalp soft tissues. Considerations include hemangiopericytoma, metastasis, and aggressive hemangioma. 2.6 mm enhancing lesion in the clivus.  C. 10/10/19: Left craniectomy with frontal brain tumor/bone resection, path + plasma cell neoplasm, lambda restricted, plasmacytoma favored. Hgb 9.9,  D. 10/31/19: Initial Hem-Onc consultation; M-spike  0; IgG 627, IgQ 51, IgM 11; SFLC: Kappa 1.12 mg/dL, Lambda 154.50, ratio 0.01; IFE: serum + monoclonal free Lambda light chain; Hgb 10.1,  E. 11/04/19: 24 hour UPEP Total protein 1318, Free lambda light chains Ur 1332.46, ratio 0.02, M-spike 27.4, M-spike 24 hr at 361; Bence Jones positive, Lambda type F. 11/08/19: BMBx 22% atypical plasma cells, 30% by IHC; FISH Dup(1q) and Del(13q) detected G. 11/10/19: PET CT no metabolic activity within the skeleton to localize active myeloma; no lytic lesions identified; no abnormal activity at the left craniectomy site; no evidence of soft tissue plasmacytoma. H. 12/05/19: Started KRD; SFLC: Kappa 1.42, Lambda 177.97, ratio 0.01; Hgb 11.0 I. 12//13/21: SFLC: Kappa 1.45, Lambda 8.92, ratio 0.16; Hgb 11.1 J. 02/06/20: SFLC: Kappa 1.10, Lambda 2.08, ratio 0.53; Hgb 11.3 K. 02/26/20: Cycle 4 of KRD; SFLC: Kappa 1.14, Lambda 1.90, ratio 0.60; Hgb 10.8 L. 04/02/20: SFLC: Kappa 1.05, Lambda 1.56, ratio 0.67; Hgb 11.2 M. 04/30/20: SFLC: Kappa 1.01, Lambda 1.40, ratio 0.72; Hgb 11.5  N. 05/05/20: MRI Brain no evidence of left frontal bone lesion recurrence; Multiple subcentimeter area of nodular enhancement in the calvarium are nonspecific but could related to myeloma given nonvisualization on prior. O. 05/11/20: PET CT no findings to suggest FDG avid lesions of multiple myeloma. P. 05/14/20: BMBx 1% plasma cells Q. 05/28/20: SFLC: Kappa 0.84, Lambda 1.16, ratio 0.72; Hgb 11.6  R. 06/11/20: EKG QT 408/QTc 424; PFT's: FEV1 110%, DLCO 58.8% predicted, 64.4% hgb corrected; ECHO: EF 55% est, 61% calc; GLS -20%; SFLC: Kappa 1.16, Lambda 1.40, ratio 0.83; Hgb 10.9-VGPR S. 07/06/20: SFLC: Kappa 1.16, Lambda 2.14, ratio 0.54; . Hgb 11.7, Plt 118 T.  6/20-7/12/22: Hospital admission for Melphalan 200 mg/m2 followed by reinfusion of 4.71 x 10^6/kg CD34+ autologous cells  MEDICAL HISTORY:  Past Medical History:  Diagnosis Date   Anxiety    Hypertension    Pre-diabetes     SURGICAL  HISTORY: Past Surgical History:  Procedure Laterality Date   ABLATION     APPLICATION OF CRANIAL NAVIGATION N/A 10/10/2019   Procedure: APPLICATION OF CRANIAL NAVIGATION;  Surgeon: Consuella Lose, MD;  Location: [facility];  Service: Neurosurgery;  Laterality: N/A;  APPLICATION OF CRANIAL NAVIGATION   CRANIOPLASTY Left 10/14/2019   Procedure: LEFT CRANIOPLASTY WITH PLACEMENT OF CUSTOM CRANIAL IMPLANT;  Surgeon: Consuella Lose, MD;  Location: [facility];  Service: Neurosurgery;  Laterality: Left;   CRANIOTOMY Left 10/10/2019   Procedure: Left frontal Stereotactic craniectomy for resection of tumor;  Surgeon: Consuella Lose, MD;  Location: [facility];  Service: Neurosurgery;  Laterality: Left;  Left frontal Stereotactic craniectomy for resection of tumor   HAMMER TOE SURGERY Left    IR IMAGING GUIDED PORT INSERTION  12/01/2019   IR IMAGING GUIDED PORT INSERTION  08/06/2021   TUBAL LIGATION      SOCIAL HISTORY: Social History   Socioeconomic History   Marital status: Single    Spouse name: Not on file   Number of children: Not on file   Years of education: Not on file   Highest education level: 12th grade  Occupational History   Not on file  Tobacco Use   Smoking status: Never   Smokeless tobacco: Never  Vaping Use   Vaping Use: Never used  Substance and Sexual Activity   Alcohol use: Yes    Comment: occassionally   Drug use: Not Currently   Sexual activity: Not on file  Other Topics Concern   Not on file  Social History Narrative   Not on file   Social Determinants of Health   Financial Resource Strain: Medium Risk (08/13/2020)   Overall Financial Resource Strain (CARDIA)    Difficulty of Paying Living Expenses: Somewhat hard  Food Insecurity: Food Insecurity Present (08/13/2020)   Hunger Vital Sign    Worried About Running Out of Food in the Last Year: Sometimes true    Ran Out of Food in the Last Year: Never true  Transportation Needs: Unmet Transportation Needs (08/13/2020)    PRAPARE - Transportation    Lack of Transportation (Medical): No    Lack of Transportation (Non-Medical): Yes  Physical Activity: Sufficiently Active (08/13/2020)   Exercise Vital Sign    Days of Exercise per Week: 5 days    Minutes of Exercise per Session: 30 min  Stress: No Stress Concern Present (08/13/2020)   [employer] [place]    Feeling of Stress : Only a little  Social Connections: Moderately Integrated (08/13/2020)   Social Connection and Isolation Panel [NHANES]    Frequency of Communication with Friends and Family: More than three times a week    Frequency of Social Gatherings with Friends and Family: Once a week    Attends Religious Services: More than 4 times per year    Active Member of [employer] or Organizations: Yes    Attends Archivist Meetings: 1 to 4 times per year    Marital Status: Divorced  Human resources officer Violence: Not on file    FAMILY HISTORY: Family History  Problem Relation Age of Onset   Breast cancer Maternal Grandmother    Heart disease Mother    Hypertension Mother  Sarcoidosis Father    Diabetes Maternal Grandfather     ALLERGIES:  has No Known Allergies.  MEDICATIONS:  Current Outpatient Medications  Medication Sig Dispense Refill   atorvastatin (LIPITOR) 10 MG tablet Take 1 tablet (10 mg total) by mouth daily. 30 tablet 1   azithromycin (ZITHROMAX) 250 MG tablet Take 2 tablets by mouth daily for 1 day, then take 1 tablet daily for 4 days. 6 tablet 0   benazepril (LOTENSIN) 5 MG tablet Take 1 tablet (5 mg total) by mouth daily. 90 tablet 1   docusate sodium (COLACE) 100 MG capsule Take 100 mg by mouth 2 (two) times daily.     hydrochlorothiazide (HYDRODIURIL) 12.5 MG tablet Take 1 tablet by mouth daily as needed for leg swelling 90 tablet 1   levocetirizine (XYZAL) 5 MG tablet TAKE 1 TABLET BY MOUTH EVERY DAY IN THE EVENING 90 tablet 3   OZEMPIC, 1 MG/DOSE, 4 MG/3ML SOPN  INJECT 1 MG UNDER THE SKIN ONCE WEEKLY, ON THE SAME DAY. 9 mL 1   pomalidomide (POMALYST) 2 MG capsule TAKE 1 CAPSULE BY MOUTH ONCE DAILY FOR 21 DAYS ON AND 7 DAYS OFF 21 capsule 0   POMALYST 2 MG capsule TAKE 1 CAPSULE BY MOUTH ONCE DAILY FOR 21 DAYS ON AND 7 DAYS OFF 21 capsule 0   traZODone (DESYREL) 50 MG tablet TAKE 1 TO 2 TABLETS BY MOUTH AT BEDTIME AS NEEDED FOR SLEEP 180 tablet 1   valACYclovir (VALTREX) 500 MG tablet Take 1 tablet by mouth 2 times a day 60 tablet 10   No current facility-administered medications for this visit.    REVIEW OF SYSTEMS:   10 Point review of Systems was done is negative except as noted above.   PHYSICAL EXAMINATION: ECOG PERFORMANCE STATUS: 1 - Symptomatic but completely ambulatory ..BP 112/71 (BP Location: Left Arm, Patient Position: Sitting)   Pulse 70   Temp 97.7 F (36.5 C) (Temporal)   Resp 16   Ht 5' 6"  (1.676 m)   Wt 173 lb 12.8 oz (78.8 kg)   LMP 07/09/2015   SpO2 100%   BMI 28.05 kg/m   NAD GENERAL:alert, in no acute distress and comfortable SKIN: no acute rashes, no significant lesions EYES: conjunctiva are pink and non-injected, sclera anicteric OROPHARYNX: MMM, no exudates, no oropharyngeal erythema or ulceration NECK: supple, no JVD LYMPH:  no palpable lymphadenopathy in the cervical, axillary or inguinal regions LUNGS: clear to auscultation b/l with normal respiratory effort HEART: regular rate & rhythm ABDOMEN:  normoactive bowel sounds , non tender, not distended. Extremity: no pedal edema PSYCH: alert & oriented x 3 with fluent speech NEURO: no focal motor/sensory deficits    Physical exam performed in infusion chair.  LABORATORY DATA:  I have reviewed the data as listed     Latest Ref Rng & Units 10/30/2021    1:10 PM 10/02/2021    2:08 PM 09/18/2021    1:44 PM  CBC  WBC 4.0 - 10.5 K/uL 2.7  2.9  2.5   Hemoglobin 12.0 - 15.0 g/dL 11.5  10.5  10.9   Hematocrit 36.0 - 46.0 % 33.1  31.0  30.6   Platelets 150 -  400 K/uL 300  272  165        Latest Ref Rng & Units 10/30/2021    1:10 PM 09/18/2021    1:44 PM 09/04/2021    2:19 PM  CMP  Glucose 70 - 99 mg/dL 120  109  106  BUN 6 - 20 mg/dL 11  20  15    Creatinine 0.44 - 1.00 mg/dL 0.74  0.78  0.78   Sodium 135 - 145 mmol/L 140  142  140   Potassium 3.5 - 5.1 mmol/L 3.7  3.5  3.9   Chloride 98 - 111 mmol/L 110  105  109   CO2 22 - 32 mmol/L 27  29  27    Calcium 8.9 - 10.3 mg/dL 8.4  9.5  9.2   Total Protein 6.5 - 8.1 g/dL 5.9  6.3  6.2   Total Bilirubin 0.3 - 1.2 mg/dL 0.5  0.7  0.4   Alkaline Phos 38 - 126 U/L 53  37  38   AST 15 - 41 U/L 19  15  12    ALT 0 - 44 U/L 12  15  13      11/08/2019 FISH Plasma Cell Myeloma Prognostic Panel:    11/08/2019 Cytogenetics:    11/08/2019 Bone Marrow Report [id number]):   10/10/2019 Surgical Pathology [phone]):   05/14/2020 Surgical Pathology "-Normocellular bone marrow for age with trilineage hematopoiesis and 1%  plasma cells  -See comment   PERIPHERAL BLOOD:  -Normocytic-normochromic anemia  -Leukopenia   COMMENT:   The bone marrow is generally normocellular for age with trilineage  hematopoiesis but with predominance of erythroid precursors.  The  myeloid changes are not considered specific and likely related to  therapy.  In this background, the plasma cells represent 1% of all cells  associated with scattered small clusters and display slight lambda light  chain excess.  The findings are limited but most suggestive of minimal  residual plasma cell neoplasm.  Correlation with cytogenetic and FISH  studies is recommended. "  RADIOGRAPHIC STUDIES:  I have personally reviewed the radiological images as listed and agreed with the findings in the report. No results found.   Bone marrow biopsy on 10/14/2021:  DIAGNOSIS    A-C. Bone marrow, aspirate smear, touch prep, clot section, core biopsy and peripheral blood smear: - Plasma cell neoplasm, based on 3% atypical  plasma cells on aspirate smear and MRD flow cytometry results with 0.2% abnormal lambda-monotypic plasma cells - Aspirate smear with erythroid predominance, no increase in blasts - Core biopsy and clot section limited for evaluation; touch prep unsatisfactory for evaluation - Congo Red stain negative for amyloid deposition - See comment  Electronically signed by Brandon Melnick, MD on 10/17/2021 at  6:35 PM  Diagnostic Comment    Corresponding flow cytometry analysis, [id number], shows a plasma cell population with aberrant immunophenotype comprising 0.2% of analyzed events, supporting the diagnosis above.     PET scan on 10/14/2021: IMPRESSION:   1.  No definite hypermetabolic disease.   2.  Indeterminate focus of hypermetabolic activity in the right proximal  arm musculature which is favored posttraumatic or related to spasm. This is  without CT correlate, but assessment is limited given lack of IV contrast.  Recommend attention on follow-up given the focal nature of this activity.    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 No M spike. Lambda light chain myeloma + Anemia + bone lesion on skull-- resected No renal failure, hypercalcemia  BMBx- 22-30% lambda restricted plasma cell MolCy Dup (1q), del (13q)  Completed 7 cycles of KRD  The pt recently had a transplant at [facility]. DISEASE Lambda Light Chain Multiple Myeloma  - She will receive Melphalan 200 mg/m2 conditioning regimen, followed by autologous stem cell rescue (see details below).  TRANSPLANT: Preparative regimen:  - Melphalan 200 mg/m2 (343m) IV: 07/09/20  Stem Cells:  - Autologous stem cell reinfusion on 07/10/20. Patient received 4.71 x 10^6  CD34+/kg. - Her course was complicated by neutropenic fevers and Bcx 6/27 positive for E coli. She received IV cefepime from 6/27 until 6/30, which cefepime was switched to zosyn in the setting of new abdominal pain. CT abdomen/pelvis was performed that showed acute uncomplicated multifocal diverticulitis involving the transverse and descending colon. She was treated with bowel rest, prn pain medication, and IV zosyn, which was continued until engraftment on 7/5 and was transitioned to ppx ceftriaxone. Unfortunately, with the transition to ceftriaxone she had recurrent fevers and as a result was restarted on zosyn. Zosyn was transitioned to oral cipro/flagyl on 7/10 with the plan to continue for a 7 day course.    Bone marrow biopsy done on 01/18/2021 showed normocellular bone marrow with no morphologic evidence of plasma cell neoplasm. 24-hour UPEP on 01/22/2021 showed unremarkable immunofixation pattern and no monoclonal protein. PET CT scan 01/16/2021-negative for FDG avid lesions of myeloma MRI brain 01/12/2021-showed no overt active myeloma lesions.  3) prediabetes 4) hypertension 5) anxiety 6) dyslipidemia  PLAN: -Patient's labs from today were discussed with her in detail. -Reviewed PET scan and bone marrow pathology results with her from 10/14/2021. It showed she had 3% plasma cells that were atypical. Number of plasma cell levels were not increased but 0.2% were atypical. She is MRD positive -She will continue her Isatuximab as per orders. -Patient reports no notable toxicities from her new regimen with Isatuximab and Pomalidomide. -We shall continue her same regimen of supportive medications. -Continue maintenance Zometa every 3 months.   Follow-ups: Continue f/u per integratde scheduling orders  The total time spent in the appointment was 32 minutes* .  All of the patient's questions were answered with apparent satisfaction. The patient knows to call the clinic with any problems,  questions or concerns.   I,Param Shah,acting as a sEducation administratorfor GSullivan Lone MD.,have documented all relevant documentation on the behalf of GSullivan Lone MD,as directed by  GSullivan Lone MD while in the presence of GSullivan Lone MD.   GSullivan LoneMD MMililani MaukaAAHIVMS SKindred Hospital - AlbuquerqueCFulton County Medical CenterHematology/Oncology Physician [facility] .*Total Encounter Time as defined by the Centers for Medicare and Medicaid Services includes, in addition to the face-to-face time of a patient visit (documented in the note above) non-face-to-face time: obtaining and reviewing outside history, ordering and reviewing medications, tests or procedures, care coordination (communications with other health care professionals or caregivers) and documentation in the medical record.

## 2021-10-31 LAB — KAPPA/LAMBDA LIGHT CHAINS
Kappa free light chain: 12.5 mg/L (ref 3.3–19.4)
Kappa, lambda light chain ratio: 0.46 (ref 0.26–1.65)
Lambda free light chains: 27.4 mg/L — ABNORMAL HIGH (ref 5.7–26.3)

## 2021-10-31 MED FILL — Levocetirizine Dihydrochloride Tab 5 MG: ORAL | 90 days supply | Qty: 90 | Fill #2 | Status: AC

## 2021-11-01 ENCOUNTER — Other Ambulatory Visit (HOSPITAL_COMMUNITY): Payer: Self-pay

## 2021-11-04 LAB — MULTIPLE MYELOMA PANEL, SERUM
Albumin SerPl Elph-Mcnc: 3.7 g/dL (ref 2.9–4.4)
Albumin/Glob SerPl: 2 — ABNORMAL HIGH (ref 0.7–1.7)
Alpha 1: 0.2 g/dL (ref 0.0–0.4)
Alpha2 Glob SerPl Elph-Mcnc: 0.6 g/dL (ref 0.4–1.0)
B-Globulin SerPl Elph-Mcnc: 0.7 g/dL (ref 0.7–1.3)
Gamma Glob SerPl Elph-Mcnc: 0.4 g/dL (ref 0.4–1.8)
Globulin, Total: 1.9 g/dL — ABNORMAL LOW (ref 2.2–3.9)
IgA: 44 mg/dL — ABNORMAL LOW (ref 87–352)
IgG (Immunoglobin G), Serum: 485 mg/dL — ABNORMAL LOW (ref 586–1602)
IgM (Immunoglobulin M), Srm: <5 mg/dL — ABNORMAL LOW (ref 26–217)
M Protein SerPl Elph-Mcnc: 0.1 g/dL — ABNORMAL HIGH
Total Protein ELP: 5.6 g/dL — ABNORMAL LOW (ref 6.0–8.5)

## 2021-11-05 ENCOUNTER — Other Ambulatory Visit: Payer: Self-pay

## 2021-11-05 DIAGNOSIS — C9000 Multiple myeloma not having achieved remission: Secondary | ICD-10-CM

## 2021-11-06 ENCOUNTER — Inpatient Hospital Stay: Payer: 59

## 2021-11-06 ENCOUNTER — Encounter: Payer: Self-pay | Admitting: Hematology

## 2021-11-06 DIAGNOSIS — Z95828 Presence of other vascular implants and grafts: Secondary | ICD-10-CM

## 2021-11-06 DIAGNOSIS — Z5112 Encounter for antineoplastic immunotherapy: Secondary | ICD-10-CM | POA: Diagnosis not present

## 2021-11-06 DIAGNOSIS — C7951 Secondary malignant neoplasm of bone: Secondary | ICD-10-CM

## 2021-11-06 DIAGNOSIS — C9000 Multiple myeloma not having achieved remission: Secondary | ICD-10-CM

## 2021-11-06 LAB — CBC WITH DIFFERENTIAL (CANCER CENTER ONLY)
Abs Immature Granulocytes: 0.01 10*3/uL (ref 0.00–0.07)
Basophils Absolute: 0.1 10*3/uL (ref 0.0–0.1)
Basophils Relative: 1 %
Eosinophils Absolute: 0.2 10*3/uL (ref 0.0–0.5)
Eosinophils Relative: 5 %
HCT: 33.5 % — ABNORMAL LOW (ref 36.0–46.0)
Hemoglobin: 11.7 g/dL — ABNORMAL LOW (ref 12.0–15.0)
Immature Granulocytes: 0 %
Lymphocytes Relative: 24 %
Lymphs Abs: 1 10*3/uL (ref 0.7–4.0)
MCH: 31.3 pg (ref 26.0–34.0)
MCHC: 34.9 g/dL (ref 30.0–36.0)
MCV: 89.6 fL (ref 80.0–100.0)
Monocytes Absolute: 1 10*3/uL (ref 0.1–1.0)
Monocytes Relative: 24 %
Neutro Abs: 1.9 10*3/uL (ref 1.7–7.7)
Neutrophils Relative %: 46 %
Platelet Count: 196 10*3/uL (ref 150–400)
RBC: 3.74 MIL/uL — ABNORMAL LOW (ref 3.87–5.11)
RDW: 15.5 % (ref 11.5–15.5)
WBC Count: 4.3 10*3/uL (ref 4.0–10.5)
nRBC: 0 % (ref 0.0–0.2)

## 2021-11-06 MED ORDER — SODIUM CHLORIDE 0.9% FLUSH
10.0000 mL | Freq: Once | INTRAVENOUS | Status: AC
  Administered 2021-11-06: 10 mL

## 2021-11-06 MED ORDER — HEPARIN SOD (PORK) LOCK FLUSH 100 UNIT/ML IV SOLN
500.0000 [IU] | Freq: Once | INTRAVENOUS | Status: AC
  Administered 2021-11-06: 500 [IU]

## 2021-11-07 ENCOUNTER — Other Ambulatory Visit: Payer: Self-pay | Admitting: Hematology

## 2021-11-08 LAB — HIV ANTIBODY (ROUTINE TESTING W REFLEX): HIV Screen 4th Generation wRfx: NONREACTIVE

## 2021-11-08 LAB — HEPATITIS B SURFACE ANTIGEN

## 2021-11-08 LAB — HEPATITIS C ANTIBODY: HCV Ab: NONREACTIVE — AB

## 2021-11-08 LAB — HEPATITIS B SURFACE ANTIBODY,QUALITATIVE

## 2021-11-13 ENCOUNTER — Other Ambulatory Visit: Payer: 59

## 2021-11-13 ENCOUNTER — Ambulatory Visit: Payer: 59

## 2021-11-26 ENCOUNTER — Telehealth: Payer: Self-pay | Admitting: Pharmacist

## 2021-11-26 ENCOUNTER — Other Ambulatory Visit: Payer: 59

## 2021-11-26 ENCOUNTER — Ambulatory Visit: Payer: 59

## 2021-11-26 ENCOUNTER — Ambulatory Visit: Payer: 59 | Admitting: Hematology

## 2021-11-26 NOTE — Telephone Encounter (Signed)
Oral Oncology Pharmacist Encounter     Patient's insurance is now requiring that all future fills of Pomalyst be filled through [facility]. Pharmacy has been updated in patient's profile. Please send all future fills to [facility] for dispensing.   Called and spoke with patient, she states she is currently not on Pomalyst at this time as she is awaiting CAR-T therapy.  Leron Croak, PharmD, BCPS, [facility] Hematology/Oncology Clinical Pharmacist [facility] [phone] 11/26/2021 12:34 PM

## 2021-11-28 DIAGNOSIS — R768 Other specified abnormal immunological findings in serum: Secondary | ICD-10-CM | POA: Insufficient documentation

## 2021-12-01 ENCOUNTER — Other Ambulatory Visit: Payer: Self-pay | Admitting: Nurse Practitioner

## 2021-12-01 DIAGNOSIS — C9000 Multiple myeloma not having achieved remission: Secondary | ICD-10-CM

## 2021-12-02 ENCOUNTER — Other Ambulatory Visit (HOSPITAL_COMMUNITY): Payer: Self-pay

## 2021-12-02 ENCOUNTER — Ambulatory Visit: Payer: 59 | Admitting: Nurse Practitioner

## 2021-12-02 ENCOUNTER — Inpatient Hospital Stay: Payer: 59 | Attending: Hematology

## 2021-12-02 ENCOUNTER — Inpatient Hospital Stay (HOSPITAL_BASED_OUTPATIENT_CLINIC_OR_DEPARTMENT_OTHER): Payer: 59 | Admitting: Hematology

## 2021-12-02 VITALS — BP 115/69 | HR 76 | Temp 97.7°F | Resp 15 | Wt 174.8 lb

## 2021-12-02 DIAGNOSIS — C9001 Multiple myeloma in remission: Secondary | ICD-10-CM | POA: Diagnosis present

## 2021-12-02 DIAGNOSIS — Z5112 Encounter for antineoplastic immunotherapy: Secondary | ICD-10-CM | POA: Diagnosis not present

## 2021-12-02 DIAGNOSIS — Z5111 Encounter for antineoplastic chemotherapy: Secondary | ICD-10-CM

## 2021-12-02 DIAGNOSIS — C9000 Multiple myeloma not having achieved remission: Secondary | ICD-10-CM

## 2021-12-02 DIAGNOSIS — Z7189 Other specified counseling: Secondary | ICD-10-CM

## 2021-12-02 DIAGNOSIS — C7951 Secondary malignant neoplasm of bone: Secondary | ICD-10-CM

## 2021-12-02 DIAGNOSIS — Z95828 Presence of other vascular implants and grafts: Secondary | ICD-10-CM

## 2021-12-02 LAB — CBC WITH DIFFERENTIAL (CANCER CENTER ONLY)
Abs Immature Granulocytes: 0.01 10*3/uL (ref 0.00–0.07)
Basophils Absolute: 0 10*3/uL (ref 0.0–0.1)
Basophils Relative: 1 %
Eosinophils Absolute: 0.1 10*3/uL (ref 0.0–0.5)
Eosinophils Relative: 2 %
HCT: 32.9 % — ABNORMAL LOW (ref 36.0–46.0)
Hemoglobin: 11.2 g/dL — ABNORMAL LOW (ref 12.0–15.0)
Immature Granulocytes: 0 %
Lymphocytes Relative: 28 %
Lymphs Abs: 1.3 10*3/uL (ref 0.7–4.0)
MCH: 30.8 pg (ref 26.0–34.0)
MCHC: 34 g/dL (ref 30.0–36.0)
MCV: 90.4 fL (ref 80.0–100.0)
Monocytes Absolute: 0.5 10*3/uL (ref 0.1–1.0)
Monocytes Relative: 10 %
Neutro Abs: 2.8 10*3/uL (ref 1.7–7.7)
Neutrophils Relative %: 59 %
Platelet Count: 253 10*3/uL (ref 150–400)
RBC: 3.64 MIL/uL — ABNORMAL LOW (ref 3.87–5.11)
RDW: 15 % (ref 11.5–15.5)
WBC Count: 4.8 10*3/uL (ref 4.0–10.5)
nRBC: 0 % (ref 0.0–0.2)

## 2021-12-02 MED ORDER — SODIUM CHLORIDE 0.9% FLUSH
10.0000 mL | Freq: Once | INTRAVENOUS | Status: AC
  Administered 2021-12-02: 10 mL

## 2021-12-02 MED ORDER — ATORVASTATIN CALCIUM 10 MG PO TABS
10.0000 mg | ORAL_TABLET | Freq: Every day | ORAL | 1 refills | Status: DC
  Filled 2021-12-02: qty 30, 30d supply, fill #0
  Filled 2022-01-01: qty 30, 30d supply, fill #1

## 2021-12-02 MED ORDER — HEPARIN SOD (PORK) LOCK FLUSH 100 UNIT/ML IV SOLN
500.0000 [IU] | Freq: Once | INTRAVENOUS | Status: AC
  Administered 2021-12-02: 500 [IU]

## 2021-12-02 NOTE — Progress Notes (Signed)
HEMATOLOGY/ONCOLOGY CLNIC NOTE  Date of Service: 12/02/21   Patient Care Team: [name], FNP as PCP - General (General Practice)  CHIEF COMPLAINTS/PURPOSE OF CONSULTATION:  Follow-up for continued evaluation and management of multiple myeloma.  HISTORY OF PRESENTING ILLNESS:   Please see previous note for details   INTERVAL HISTORY: [name] is here for continued valuation and management of her multiple myeloma.    Patient was last seen by me on 10/30/2021 and was doing well without any new medical concerns.   She notes she is doing well overall during today's visit. She had cough recently, but is doing well.   She notes she is an upcoming LB chemo shots on around December 21, 22, and 23. Patient is getting CAR T-cell infusion around January 14, 2022. She will hold off her treatment while she receives her chemo shots and CAR T-Cell Infusion in December.   She complains of bilateral shoulder joint pain due to aggressive shoulder movement.   She denies back pain, abdominal pain, fever, chills, leg swelling, and night sweats.   ONCOLOGIC HISTORY   Lambda Light Chain Multiple Myeloma  A. 09/27/19: CT Maxillofacial enhancing dural-based anterior cranial fossa mass traversing the calvarium and extending into the extracalvarial soft tissues along with a 7.4 mm rightward midline shift at the level of anterior cranial fossa. MRI head with and without contrast is recommended for further evaluation. B. 10/05/19: MRI Head large extra-axial mass anterior left frontal region with bone erosion and extension into the scalp soft tissues. Considerations include hemangiopericytoma, metastasis, and aggressive hemangioma. 2.6 mm enhancing lesion in the clivus.  C. 10/10/19: Left craniectomy with frontal brain tumor/bone resection, path + plasma cell neoplasm, lambda restricted, plasmacytoma favored. Hgb 9.9,  D. 10/31/19: Initial Hem-Onc consultation; M-spike 0; IgG 627, IgQ 51, IgM 11; SFLC:  Kappa 1.12 mg/dL, Lambda 154.50, ratio 0.01; IFE: serum + monoclonal free Lambda light chain; Hgb 10.1,  E. 11/04/19: 24 hour UPEP Total protein 1318, Free lambda light chains Ur 1332.46, ratio 0.02, M-spike 27.4, M-spike 24 hr at 361; Bence Jones positive, Lambda type F. 11/08/19: BMBx 22% atypical plasma cells, 30% by IHC; FISH Dup(1q) and Del(13q) detected G. 11/10/19: PET CT no metabolic activity within the skeleton to localize active myeloma; no lytic lesions identified; no abnormal activity at the left craniectomy site; no evidence of soft tissue plasmacytoma. H. 12/05/19: Started KRD; SFLC: Kappa 1.42, Lambda 177.97, ratio 0.01; Hgb 11.0 I. 12//13/21: SFLC: Kappa 1.45, Lambda 8.92, ratio 0.16; Hgb 11.1 J. 02/06/20: SFLC: Kappa 1.10, Lambda 2.08, ratio 0.53; Hgb 11.3 K. 02/26/20: Cycle 4 of KRD; SFLC: Kappa 1.14, Lambda 1.90, ratio 0.60; Hgb 10.8 L. 04/02/20: SFLC: Kappa 1.05, Lambda 1.56, ratio 0.67; Hgb 11.2 M. 04/30/20: SFLC: Kappa 1.01, Lambda 1.40, ratio 0.72; Hgb 11.5  N. 05/05/20: MRI Brain no evidence of left frontal bone lesion recurrence; Multiple subcentimeter area of nodular enhancement in the calvarium are nonspecific but could related to myeloma given nonvisualization on prior. O. 05/11/20: PET CT no findings to suggest FDG avid lesions of multiple myeloma. P. 05/14/20: BMBx 1% plasma cells Q. 05/28/20: SFLC: Kappa 0.84, Lambda 1.16, ratio 0.72; Hgb 11.6  R. 06/11/20: EKG QT 408/QTc 424; PFT's: FEV1 110%, DLCO 58.8% predicted, 64.4% hgb corrected; ECHO: EF 55% est, 61% calc; GLS -20%; SFLC: Kappa 1.16, Lambda 1.40, ratio 0.83; Hgb 10.9-VGPR S. 07/06/20: SFLC: Kappa 1.16, Lambda 2.14, ratio 0.54; . Hgb 11.7, Plt 118 T. 6/20-7/12/22: Hospital admission for Melphalan 200 mg/m2 followed by  reinfusion of 4.71 x 10^6/kg CD34+ autologous cells  MEDICAL HISTORY:  Past Medical History:  Diagnosis Date   Anxiety    Hypertension    Pre-diabetes     SURGICAL HISTORY: Past Surgical History:   Procedure Laterality Date   ABLATION     APPLICATION OF CRANIAL NAVIGATION N/A 10/10/2019   Procedure: APPLICATION OF CRANIAL NAVIGATION;  Surgeon: Consuella Lose, MD;  Location: [facility];  Service: Neurosurgery;  Laterality: N/A;  APPLICATION OF CRANIAL NAVIGATION   CRANIOPLASTY Left 10/14/2019   Procedure: LEFT CRANIOPLASTY WITH PLACEMENT OF CUSTOM CRANIAL IMPLANT;  Surgeon: Consuella Lose, MD;  Location: [facility];  Service: Neurosurgery;  Laterality: Left;   CRANIOTOMY Left 10/10/2019   Procedure: Left frontal Stereotactic craniectomy for resection of tumor;  Surgeon: Consuella Lose, MD;  Location: [facility];  Service: Neurosurgery;  Laterality: Left;  Left frontal Stereotactic craniectomy for resection of tumor   HAMMER TOE SURGERY Left    IR IMAGING GUIDED PORT INSERTION  12/01/2019   IR IMAGING GUIDED PORT INSERTION  08/06/2021   TUBAL LIGATION      SOCIAL HISTORY: Social History   Socioeconomic History   Marital status: Single    Spouse name: Not on file   Number of children: Not on file   Years of education: Not on file   Highest education level: 12th grade  Occupational History   Not on file  Tobacco Use   Smoking status: Never   Smokeless tobacco: Never  Vaping Use   Vaping Use: Never used  Substance and Sexual Activity   Alcohol use: Yes    Comment: occassionally   Drug use: Not Currently   Sexual activity: Not on file  Other Topics Concern   Not on file  Social History Narrative   Not on file   Social Determinants of Health   Financial Resource Strain: Medium Risk (08/13/2020)   Overall Financial Resource Strain (CARDIA)    Difficulty of Paying Living Expenses: Somewhat hard  Food Insecurity: Food Insecurity Present (08/13/2020)   Hunger Vital Sign    Worried About Running Out of Food in the Last Year: Sometimes true    Ran Out of Food in the Last Year: Never true  Transportation Needs: Unmet Transportation Needs (08/13/2020)   PRAPARE - Transportation     Lack of Transportation (Medical): No    Lack of Transportation (Non-Medical): Yes  Physical Activity: Sufficiently Active (08/13/2020)   Exercise Vital Sign    Days of Exercise per Week: 5 days    Minutes of Exercise per Session: 30 min  Stress: No Stress Concern Present (08/13/2020)   [employer] [place]    Feeling of Stress : Only a little  Social Connections: Moderately Integrated (08/13/2020)   Social Connection and Isolation Panel [NHANES]    Frequency of Communication with Friends and Family: More than three times a week    Frequency of Social Gatherings with Friends and Family: Once a week    Attends Religious Services: More than 4 times per year    Active Member of [employer] or Organizations: Yes    Attends Archivist Meetings: 1 to 4 times per year    Marital Status: Divorced  Human resources officer Violence: Not on file    FAMILY HISTORY: Family History  Problem Relation Age of Onset   Breast cancer Maternal Grandmother    Heart disease Mother    Hypertension Mother    Sarcoidosis Father    Diabetes Maternal  Grandfather     ALLERGIES:  has No Known Allergies.  MEDICATIONS:  Current Outpatient Medications  Medication Sig Dispense Refill   atorvastatin (LIPITOR) 10 MG tablet Take 1 tablet (10 mg total) by mouth daily. 30 tablet 1   azithromycin (ZITHROMAX) 250 MG tablet Take 2 tablets by mouth daily for 1 day, then take 1 tablet daily for 4 days. 6 tablet 0   benazepril (LOTENSIN) 5 MG tablet Take 1 tablet (5 mg total) by mouth daily. (Patient not taking: Reported on 10/30/2021) 90 tablet 1   docusate sodium (COLACE) 100 MG capsule Take 100 mg by mouth 2 (two) times daily.     hydrochlorothiazide (HYDRODIURIL) 12.5 MG tablet Take 1 tablet by mouth daily as needed for leg swelling (Patient not taking: Reported on 10/30/2021) 90 tablet 1   levocetirizine (XYZAL) 5 MG tablet TAKE 1 TABLET BY MOUTH EVERY DAY IN THE  EVENING 90 tablet 3   OZEMPIC, 1 MG/DOSE, 4 MG/3ML SOPN INJECT 1 MG UNDER THE SKIN ONCE WEEKLY, ON THE SAME DAY. 9 mL 1   pomalidomide (POMALYST) 2 MG capsule TAKE 1 CAPSULE BY MOUTH ONCE DAILY FOR 21 DAYS ON AND 7 DAYS OFF 21 capsule 0   POMALYST 2 MG capsule TAKE 1 CAPSULE BY MOUTH ONCE DAILY FOR 21 DAYS ON AND 7 DAYS OFF 21 capsule 0   traZODone (DESYREL) 50 MG tablet TAKE 1 TO 2 TABLETS BY MOUTH AT BEDTIME AS NEEDED FOR SLEEP 180 tablet 1   valACYclovir (VALTREX) 500 MG tablet Take 1 tablet by mouth 2 times a day 60 tablet 10   No current facility-administered medications for this visit.    REVIEW OF SYSTEMS:   10 Point review of Systems was done is negative except as noted above.   PHYSICAL EXAMINATION: ECOG PERFORMANCE STATUS: 1 - Symptomatic but completely ambulatory .Marland KitchenLMP 07/09/2015   NAD GENERAL:alert, in no acute distress and comfortable SKIN: no acute rashes, no significant lesions EYES: conjunctiva are pink and non-injected, sclera anicteric OROPHARYNX: MMM, no exudates, no oropharyngeal erythema or ulceration NECK: supple, no JVD LYMPH:  no palpable lymphadenopathy in the cervical, axillary or inguinal regions LUNGS: clear to auscultation b/l with normal respiratory effort HEART: regular rate & rhythm ABDOMEN:  normoactive bowel sounds , non tender, not distended. Extremity: no pedal edema PSYCH: alert & oriented x 3 with fluent speech NEURO: no focal motor/sensory deficits    Physical exam performed in infusion chair.  LABORATORY DATA:  I have reviewed the data as listed     Latest Ref Rng & Units 11/06/2021    3:43 PM 10/30/2021    1:10 PM 10/02/2021    2:08 PM  CBC  WBC 4.0 - 10.5 K/uL 4.3  2.7  2.9   Hemoglobin 12.0 - 15.0 g/dL 11.7  11.5  10.5   Hematocrit 36.0 - 46.0 % 33.5  33.1  31.0   Platelets 150 - 400 K/uL 196  300  272        Latest Ref Rng & Units 10/30/2021    1:10 PM 09/18/2021    1:44 PM 09/04/2021    2:19 PM  CMP  Glucose 70 - 99  mg/dL 120  109  106   BUN 6 - 20 mg/dL _0 Creatinine 0.44 - 1.00 mg/dL 0.74  0.78  0.78   Sodium 135 - 145 mmol/L 140  142  140   Potassium 3.5 - 5.1 mmol/L 3.7  3.5  3.9  Chloride 98 - 111 mmol/L 110  105  109   CO2 22 - 32 mmol/L _0 Calcium 8.9 - 10.3 mg/dL 8.4  9.5  9.2   Total Protein 6.5 - 8.1 g/dL 5.9  6.3  6.2   Total Bilirubin 0.3 - 1.2 mg/dL 0.5  0.7  0.4   Alkaline Phos 38 - 126 U/L 53  37  38   AST 15 - 41 U/L _1 ALT 0 - 44 U/L _2 11/08/2019 FISH Plasma Cell Myeloma Prognostic Panel:    11/08/2019 Cytogenetics:    11/08/2019 Bone Marrow Report [phone]):   10/10/2019 Surgical Pathology [phone]):   05/14/2020 Surgical Pathology "-Normocellular bone marrow for age with trilineage hematopoiesis and 1%  plasma cells  -See comment   PERIPHERAL BLOOD:  -Normocytic-normochromic anemia  -Leukopenia   COMMENT:   The bone marrow is generally normocellular for age with trilineage  hematopoiesis but with predominance of erythroid precursors.  The  myeloid changes are not considered specific and likely related to  therapy.  In this background, the plasma cells represent 1% of all cells  associated with scattered small clusters and display slight lambda light  chain excess.  The findings are limited but most suggestive of minimal  residual plasma cell neoplasm.  Correlation with cytogenetic and FISH  studies is recommended. "  RADIOGRAPHIC STUDIES:  I have personally reviewed the radiological images as listed and agreed with the findings in the report. No results found.   Bone marrow biopsy on 10/14/2021:  DIAGNOSIS    A-C. Bone marrow, aspirate smear, touch prep, clot section, core biopsy and peripheral blood smear: - Plasma cell neoplasm, based on 3% atypical plasma cells on aspirate smear and MRD flow cytometry results with 0.2% abnormal lambda-monotypic plasma cells - Aspirate smear with erythroid  predominance, no increase in blasts - Core biopsy and clot section limited for evaluation; touch prep unsatisfactory for evaluation - Congo Red stain negative for amyloid deposition - See comment  Electronically signed by Brandon Melnick, MD on 10/17/2021 at  6:35 PM  Diagnostic Comment    Corresponding flow cytometry analysis, [id number], shows a plasma cell population with aberrant immunophenotype comprising 0.2% of analyzed events, supporting the diagnosis above.     PET scan on 10/14/2021: IMPRESSION:   1.  No definite hypermetabolic disease.   2.  Indeterminate focus of hypermetabolic activity in the right proximal  arm musculature which is favored posttraumatic or related to spasm. This is  without CT correlate, but assessment is limited given lack of IV contrast.  Recommend attention on follow-up given the focal nature of this activity.    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 No M spike. Lambda light chain myeloma + Anemia + bone lesion on skull-- resected No renal failure, hypercalcemia  BMBx- 22-30% lambda restricted plasma cell MolCy Dup (1q), del (13q)  Completed 7 cycles of KRD  The pt recently had a transplant at [facility]. DISEASE Lambda Light Chain Multiple Myeloma  - She will receive Melphalan 200 mg/m2 conditioning regimen, followed by autologous stem cell rescue (see details below).  TRANSPLANT: Preparative regimen:  - Melphalan 200 mg/m2 (  373m) IV: 07/09/20  Stem Cells:  - Autologous stem cell reinfusion on 07/10/20. Patient received 4.71 x 10^6 CD34+/kg. - Her course was complicated by neutropenic fevers and Bcx 6/27 positive for E coli. She received IV cefepime from 6/27 until 6/30,  which cefepime was switched to zosyn in the setting of new abdominal pain. CT abdomen/pelvis was performed that showed acute uncomplicated multifocal diverticulitis involving the transverse and descending colon. She was treated with bowel rest, prn pain medication, and IV zosyn, which was continued until engraftment on 7/5 and was transitioned to ppx ceftriaxone. Unfortunately, with the transition to ceftriaxone she had recurrent fevers and as a result was restarted on zosyn. Zosyn was transitioned to oral cipro/flagyl on 7/10 with the plan to continue for a 7 day course.    Bone marrow biopsy done on 01/18/2021 showed normocellular bone marrow with no morphologic evidence of plasma cell neoplasm. 24-hour UPEP on 01/22/2021 showed unremarkable immunofixation pattern and no monoclonal protein. PET CT scan 01/16/2021-negative for FDG avid lesions of myeloma MRI brain 01/12/2021-showed no overt active myeloma lesions.  3) prediabetes 4) hypertension 5) anxiety 6) dyslipidemia  PLAN: -Patient's labs from today were discussed with her in detail. Stable CMP and CBC. -She will continue her Isatuximab as per orders. -Patient reports no notable toxicities from her new regimen with Isatuximab and Pomalidomide. -We shall continue her same regimen of supportive medications. -Continue maintenance Zometa every 3 months. [facility]  Follow-ups: Per integrated scheduling  The total time spent in the appointment was 30 minutes* .  All of the patient's questions were answered with apparent satisfaction. The patient knows to call the clinic with any problems, questions or concerns.   IZettie Cooley am acting as a scribe for GSullivan Lone MD.  GSullivan LoneMD MUmatillaAAHIVMS SLakes[facility] Surgery LtdCDmc Surgery HospitalHematology/Oncology Physician [facility] .*Total Encounter Time as defined by the Centers for Medicare and Medicaid Services includes, in addition to the face-to-face time of a  patient visit (documented in the note above) non-face-to-face time: obtaining and reviewing outside history, ordering and reviewing medications, tests or procedures, care coordination (communications with other health care professionals or caregivers) and documentation in the medical record.

## 2021-12-03 ENCOUNTER — Other Ambulatory Visit (HOSPITAL_COMMUNITY): Payer: Self-pay

## 2021-12-04 ENCOUNTER — Other Ambulatory Visit: Payer: Self-pay

## 2021-12-04 ENCOUNTER — Other Ambulatory Visit: Payer: Self-pay | Admitting: Hematology

## 2021-12-04 ENCOUNTER — Inpatient Hospital Stay: Payer: 59

## 2021-12-04 ENCOUNTER — Other Ambulatory Visit (HOSPITAL_COMMUNITY): Payer: Self-pay

## 2021-12-04 ENCOUNTER — Other Ambulatory Visit: Payer: 59

## 2021-12-04 VITALS — BP 128/71 | HR 76 | Temp 98.2°F | Resp 18

## 2021-12-04 DIAGNOSIS — C9000 Multiple myeloma not having achieved remission: Secondary | ICD-10-CM

## 2021-12-04 DIAGNOSIS — Z7189 Other specified counseling: Secondary | ICD-10-CM

## 2021-12-04 DIAGNOSIS — C7951 Secondary malignant neoplasm of bone: Secondary | ICD-10-CM

## 2021-12-04 DIAGNOSIS — Z5112 Encounter for antineoplastic immunotherapy: Secondary | ICD-10-CM | POA: Diagnosis not present

## 2021-12-04 DIAGNOSIS — Z95828 Presence of other vascular implants and grafts: Secondary | ICD-10-CM

## 2021-12-04 MED ORDER — SODIUM CHLORIDE 0.9 % IV SOLN: Freq: Once | INTRAVENOUS | Status: AC

## 2021-12-04 MED ORDER — ZOLEDRONIC ACID 4 MG/100ML IV SOLN
4.0000 mg | Freq: Once | INTRAVENOUS | Status: AC
  Administered 2021-12-04: 4 mg via INTRAVENOUS
  Filled 2021-12-04: qty 100

## 2021-12-04 MED ORDER — HEPARIN SOD (PORK) LOCK FLUSH 100 UNIT/ML IV SOLN
500.0000 [IU] | Freq: Once | INTRAVENOUS | Status: AC
  Administered 2021-12-04: 500 [IU] via INTRAVENOUS

## 2021-12-04 MED ORDER — SODIUM CHLORIDE 0.9% FLUSH
10.0000 mL | Freq: Once | INTRAVENOUS | Status: AC
  Administered 2021-12-04: 10 mL via INTRAVENOUS

## 2021-12-04 NOTE — Progress Notes (Signed)
Pt okay for tx today. Dr Irene Limbo aware of last labs at [facility] on 11/27/21.

## 2021-12-04 NOTE — Patient Instructions (Signed)

## 2021-12-04 NOTE — Progress Notes (Signed)
Pt sched for zometa today and sarclisa next week, per treatment plan sarclisa to be given today. Verified with MD. D/t scheduling conflict unable to administer both today, pt request zometa today and keep 11/22 appt for sarclisa. MD aware

## 2021-12-08 ENCOUNTER — Encounter: Payer: Self-pay | Admitting: Hematology

## 2021-12-11 ENCOUNTER — Inpatient Hospital Stay: Payer: 59

## 2021-12-11 ENCOUNTER — Other Ambulatory Visit: Payer: Self-pay

## 2021-12-11 VITALS — BP 106/81 | HR 71 | Temp 98.6°F | Resp 17 | Wt 173.5 lb

## 2021-12-11 DIAGNOSIS — Z7189 Other specified counseling: Secondary | ICD-10-CM

## 2021-12-11 DIAGNOSIS — C9000 Multiple myeloma not having achieved remission: Secondary | ICD-10-CM

## 2021-12-11 DIAGNOSIS — Z5112 Encounter for antineoplastic immunotherapy: Secondary | ICD-10-CM | POA: Diagnosis not present

## 2021-12-11 DIAGNOSIS — C7951 Secondary malignant neoplasm of bone: Secondary | ICD-10-CM

## 2021-12-11 DIAGNOSIS — Z95828 Presence of other vascular implants and grafts: Secondary | ICD-10-CM

## 2021-12-11 LAB — CBC WITH DIFFERENTIAL (CANCER CENTER ONLY)
Abs Immature Granulocytes: 0.01 10*3/uL (ref 0.00–0.07)
Basophils Absolute: 0 10*3/uL (ref 0.0–0.1)
Basophils Relative: 1 %
Eosinophils Absolute: 0.3 10*3/uL (ref 0.0–0.5)
Eosinophils Relative: 7 %
HCT: 31.8 % — ABNORMAL LOW (ref 36.0–46.0)
Hemoglobin: 11 g/dL — ABNORMAL LOW (ref 12.0–15.0)
Immature Granulocytes: 0 %
Lymphocytes Relative: 28 %
Lymphs Abs: 1.1 10*3/uL (ref 0.7–4.0)
MCH: 30.8 pg (ref 26.0–34.0)
MCHC: 34.6 g/dL (ref 30.0–36.0)
MCV: 89.1 fL (ref 80.0–100.0)
Monocytes Absolute: 0.5 10*3/uL (ref 0.1–1.0)
Monocytes Relative: 12 %
Neutro Abs: 2.1 10*3/uL (ref 1.7–7.7)
Neutrophils Relative %: 52 %
Platelet Count: 250 10*3/uL (ref 150–400)
RBC: 3.57 MIL/uL — ABNORMAL LOW (ref 3.87–5.11)
RDW: 14.3 % (ref 11.5–15.5)
WBC Count: 3.9 10*3/uL — ABNORMAL LOW (ref 4.0–10.5)
nRBC: 0 % (ref 0.0–0.2)

## 2021-12-11 MED ORDER — HEPARIN SOD (PORK) LOCK FLUSH 100 UNIT/ML IV SOLN
500.0000 [IU] | Freq: Once | INTRAVENOUS | Status: AC | PRN
  Administered 2021-12-11: 500 [IU]

## 2021-12-11 MED ORDER — FAMOTIDINE IN NACL 20-0.9 MG/50ML-% IV SOLN
20.0000 mg | Freq: Once | INTRAVENOUS | Status: AC
  Administered 2021-12-11: 20 mg via INTRAVENOUS
  Filled 2021-12-11: qty 50

## 2021-12-11 MED ORDER — SODIUM CHLORIDE 0.9% FLUSH
10.0000 mL | INTRAVENOUS | Status: DC | PRN
  Administered 2021-12-11: 10 mL

## 2021-12-11 MED ORDER — DIPHENHYDRAMINE HCL 50 MG/ML IJ SOLN
50.0000 mg | Freq: Once | INTRAMUSCULAR | Status: AC
  Administered 2021-12-11: 50 mg via INTRAVENOUS
  Filled 2021-12-11: qty 1

## 2021-12-11 MED ORDER — SODIUM CHLORIDE 0.9 % IV SOLN: Freq: Once | INTRAVENOUS | Status: AC

## 2021-12-11 MED ORDER — ACETAMINOPHEN 325 MG PO TABS
650.0000 mg | ORAL_TABLET | Freq: Once | ORAL | Status: AC
  Administered 2021-12-11: 650 mg via ORAL
  Filled 2021-12-11: qty 2

## 2021-12-11 MED ORDER — SODIUM CHLORIDE 0.9 % IV SOLN
40.0000 mg | Freq: Once | INTRAVENOUS | Status: AC
  Administered 2021-12-11: 40 mg via INTRAVENOUS
  Filled 2021-12-11: qty 4

## 2021-12-11 MED ORDER — SODIUM CHLORIDE 0.9 % IV SOLN
10.0000 mg/kg | Freq: Once | INTRAVENOUS | Status: AC
  Administered 2021-12-11: 800 mg via INTRAVENOUS
  Filled 2021-12-11: qty 25

## 2021-12-11 MED ORDER — MONTELUKAST SODIUM 10 MG PO TABS
10.0000 mg | ORAL_TABLET | Freq: Once | ORAL | Status: AC
  Administered 2021-12-11: 10 mg via ORAL
  Filled 2021-12-11: qty 1

## 2021-12-11 MED ORDER — SODIUM CHLORIDE 0.9% FLUSH
10.0000 mL | Freq: Once | INTRAVENOUS | Status: AC
  Administered 2021-12-11: 10 mL

## 2021-12-11 NOTE — Patient Instructions (Signed)
[facility] ONCOLOG[facility]  Discharge Instructions: Thank you for choosing [facility] to provide your oncology and hematology care.   If you have a lab appointment with the [facility], please go directly to the [facility] and check in at the registration area.   Wear comfortable clothing and clothing appropriate for easy access to any Portacath or PICC line.   We strive to give you quality time with your provider. You may need to reschedule your appointment if you arrive late (15 or more minutes).  Arriving late affects you and other patients whose appointments are after yours.  Also, if you miss three or more appointments without notifying the office, you may be dismissed from the clinic at the provider's discretion.      For prescription refill requests, have your pharmacy contact our office and allow 72 hours for refills to be completed.    Today you received the following chemotherapy and/or immunotherapy agents: Sarclisa      To help prevent nausea and vomiting after your treatment, we encourage you to take your nausea medication as directed.  BELOW ARE SYMPTOMS THAT SHOULD BE REPORTED IMMEDIATELY: *FEVER GREATER THAN 100.4 F (38 C) OR HIGHER *CHILLS OR SWEATING *NAUSEA AND VOMITING THAT IS NOT CONTROLLED WITH YOUR NAUSEA MEDICATION *UNUSUAL SHORTNESS OF BREATH *UNUSUAL BRUISING OR BLEEDING *URINARY PROBLEMS (pain or burning when urinating, or frequent urination) *BOWEL PROBLEMS (unusual diarrhea, constipation, pain near the anus) TENDERNESS IN MOUTH AND THROAT WITH OR WITHOUT PRESENCE OF ULCERS (sore throat, sores in mouth, or a toothache) UNUSUAL RASH, SWELLING OR PAIN  UNUSUAL VAGINAL DISCHARGE OR ITCHING   Items with * indicate a potential emergency and should be followed up as soon as possible or go to the Emergency Department if any problems should occur.  Please show the CHEMOTHERAPY ALERT CARD or IMMUNOTHERAPY ALERT CARD at check-in to  the Emergency Department and triage nurse.  Should you have questions after your visit or need to cancel or reschedule your appointment, please contact [facility]  Dept: [phone]  and follow the prompts.  Office hours are 8:00 a.m. to 4:30 p.m. Monday - Friday. Please note that voicemails left after 4:00 p.m. may not be returned until the following business day.  We are closed weekends and major holidays. You have access to a nurse at all times for urgent questions. Please call the main number to the clinic Dept: [phone] and follow the prompts.   For any non-urgent questions, you may also contact your provider using MyChart. We now offer e-Visits for anyone 26 and older to request care online for non-urgent symptoms. For details visit [email or web address].   Also download the MyChart app! Go to the app store, search "MyChart", open the app, select [facility], and log in with your MyChart username and password.  Masks are optional in the cancer centers. If you would like for your care team to wear a mask while they are taking care of you, please let them know. You may have one support person who is at least 58 years old accompany you for your appointments.

## 2021-12-17 ENCOUNTER — Other Ambulatory Visit: Payer: Self-pay

## 2021-12-17 DIAGNOSIS — C9000 Multiple myeloma not having achieved remission: Secondary | ICD-10-CM

## 2021-12-19 ENCOUNTER — Other Ambulatory Visit: Payer: Self-pay

## 2021-12-19 ENCOUNTER — Inpatient Hospital Stay: Payer: 59

## 2021-12-19 DIAGNOSIS — Z95828 Presence of other vascular implants and grafts: Secondary | ICD-10-CM

## 2021-12-19 DIAGNOSIS — C7951 Secondary malignant neoplasm of bone: Secondary | ICD-10-CM

## 2021-12-19 DIAGNOSIS — C9000 Multiple myeloma not having achieved remission: Secondary | ICD-10-CM

## 2021-12-19 DIAGNOSIS — Z5112 Encounter for antineoplastic immunotherapy: Secondary | ICD-10-CM | POA: Diagnosis not present

## 2021-12-19 LAB — CBC WITH DIFFERENTIAL (CANCER CENTER ONLY)
Abs Immature Granulocytes: 0.02 10*3/uL (ref 0.00–0.07)
Basophils Absolute: 0 10*3/uL (ref 0.0–0.1)
Basophils Relative: 1 %
Eosinophils Absolute: 0.2 10*3/uL (ref 0.0–0.5)
Eosinophils Relative: 4 %
HCT: 31.7 % — ABNORMAL LOW (ref 36.0–46.0)
Hemoglobin: 11.1 g/dL — ABNORMAL LOW (ref 12.0–15.0)
Immature Granulocytes: 1 %
Lymphocytes Relative: 29 %
Lymphs Abs: 1.3 10*3/uL (ref 0.7–4.0)
MCH: 31.2 pg (ref 26.0–34.0)
MCHC: 35 g/dL (ref 30.0–36.0)
MCV: 89 fL (ref 80.0–100.0)
Monocytes Absolute: 0.9 10*3/uL (ref 0.1–1.0)
Monocytes Relative: 20 %
Neutro Abs: 2 10*3/uL (ref 1.7–7.7)
Neutrophils Relative %: 45 %
Platelet Count: 226 10*3/uL (ref 150–400)
RBC: 3.56 MIL/uL — ABNORMAL LOW (ref 3.87–5.11)
RDW: 14.7 % (ref 11.5–15.5)
WBC Count: 4.4 10*3/uL (ref 4.0–10.5)
nRBC: 0 % (ref 0.0–0.2)

## 2021-12-19 LAB — CMP (CANCER CENTER ONLY)
ALT: 12 U/L (ref 0–44)
AST: 11 U/L — ABNORMAL LOW (ref 15–41)
Albumin: 4 g/dL (ref 3.5–5.0)
Alkaline Phosphatase: 49 U/L (ref 38–126)
Anion gap: 4 — ABNORMAL LOW (ref 5–15)
BUN: 14 mg/dL (ref 6–20)
CO2: 29 mmol/L (ref 22–32)
Calcium: 9.6 mg/dL (ref 8.9–10.3)
Chloride: 106 mmol/L (ref 98–111)
Creatinine: 0.73 mg/dL (ref 0.44–1.00)
GFR, Estimated: >60 mL/min (ref >60)
Glucose, Bld: 102 mg/dL — ABNORMAL HIGH (ref 70–99)
Potassium: 3.9 mmol/L (ref 3.5–5.1)
Sodium: 139 mmol/L (ref 135–145)
Total Bilirubin: 0.4 mg/dL (ref 0.3–1.2)
Total Protein: 6.2 g/dL — ABNORMAL LOW (ref 6.5–8.1)

## 2021-12-19 MED ORDER — HEPARIN SOD (PORK) LOCK FLUSH 100 UNIT/ML IV SOLN
500.0000 [IU] | Freq: Once | INTRAVENOUS | Status: AC
  Administered 2021-12-19: 500 [IU]

## 2021-12-19 MED ORDER — SODIUM CHLORIDE 0.9% FLUSH
10.0000 mL | Freq: Once | INTRAVENOUS | Status: AC
  Administered 2021-12-19: 10 mL

## 2021-12-20 LAB — KAPPA/LAMBDA LIGHT CHAINS
Kappa free light chain: 10 mg/L (ref 3.3–19.4)
Kappa, lambda light chain ratio: 0.12 — ABNORMAL LOW (ref 0.26–1.65)
Lambda free light chains: 86.2 mg/L — ABNORMAL HIGH (ref 5.7–26.3)

## 2021-12-24 ENCOUNTER — Ambulatory Visit (INDEPENDENT_AMBULATORY_CARE_PROVIDER_SITE_OTHER): Payer: 59 | Admitting: Nurse Practitioner

## 2021-12-24 ENCOUNTER — Encounter: Payer: Self-pay | Admitting: Hematology

## 2021-12-24 ENCOUNTER — Encounter: Payer: Self-pay | Admitting: Nurse Practitioner

## 2021-12-24 ENCOUNTER — Other Ambulatory Visit: Payer: Self-pay | Admitting: Hematology

## 2021-12-24 VITALS — BP 110/74 | HR 82 | Temp 98.1°F | Ht 66.0 in | Wt 175.4 lb

## 2021-12-24 DIAGNOSIS — C9000 Multiple myeloma not having achieved remission: Secondary | ICD-10-CM

## 2021-12-24 DIAGNOSIS — E119 Type 2 diabetes mellitus without complications: Secondary | ICD-10-CM | POA: Insufficient documentation

## 2021-12-24 DIAGNOSIS — E1169 Type 2 diabetes mellitus with other specified complication: Secondary | ICD-10-CM

## 2021-12-24 DIAGNOSIS — E782 Mixed hyperlipidemia: Secondary | ICD-10-CM | POA: Diagnosis not present

## 2021-12-24 DIAGNOSIS — Z9481 Bone marrow transplant status: Secondary | ICD-10-CM

## 2021-12-24 DIAGNOSIS — I10 Essential (primary) hypertension: Secondary | ICD-10-CM | POA: Diagnosis not present

## 2021-12-24 LAB — MULTIPLE MYELOMA PANEL, SERUM
Albumin SerPl Elph-Mcnc: 3.5 g/dL (ref 2.9–4.4)
Albumin/Glob SerPl: 1.6 (ref 0.7–1.7)
Alpha 1: 0.2 g/dL (ref 0.0–0.4)
Alpha2 Glob SerPl Elph-Mcnc: 0.7 g/dL (ref 0.4–1.0)
B-Globulin SerPl Elph-Mcnc: 0.8 g/dL (ref 0.7–1.3)
Gamma Glob SerPl Elph-Mcnc: 0.5 g/dL (ref 0.4–1.8)
Globulin, Total: 2.3 g/dL (ref 2.2–3.9)
IgA: 44 mg/dL — ABNORMAL LOW (ref 87–352)
IgG (Immunoglobin G), Serum: 459 mg/dL — ABNORMAL LOW (ref 586–1602)
IgM (Immunoglobulin M), Srm: 5 mg/dL — ABNORMAL LOW (ref 26–217)
Total Protein ELP: 5.8 g/dL — ABNORMAL LOW (ref 6.0–8.5)

## 2021-12-24 NOTE — Patient Instructions (Signed)
Hypertension, Adult ?Hypertension is another name for high blood pressure. High blood pressure forces your heart to work harder to pump blood. This can cause problems over time. ?There are two numbers in a blood pressure reading. There is a top number (systolic) over a bottom number (diastolic). It is best to have a blood pressure that is below 120/80. ?What are the causes? ?The cause of this condition is not known. Some other conditions can lead to high blood pressure. ?What increases the risk? ?Some lifestyle factors can make you more likely to develop high blood pressure: ?Smoking. ?Not getting enough exercise or physical activity. ?Being overweight. ?Having too much fat, sugar, calories, or salt (sodium) in your diet. ?Drinking too much alcohol. ?Other risk factors include: ?Having any of these conditions: ?Heart disease. ?Diabetes. ?High cholesterol. ?Kidney disease. ?Obstructive sleep apnea. ?Having a family history of high blood pressure and high cholesterol. ?Age. The risk increases with age. ?Stress. ?What are the signs or symptoms? ?High blood pressure may not cause symptoms. Very high blood pressure (hypertensive crisis) may cause: ?Headache. ?Fast or uneven heartbeats (palpitations). ?Shortness of breath. ?Nosebleed. ?Vomiting or feeling like you may vomit (nauseous). ?Changes in how you see. ?Very bad chest pain. ?Feeling dizzy. ?Seizures. ?How is this treated? ?This condition is treated by making healthy lifestyle changes, such as: ?Eating healthy foods. ?Exercising more. ?Drinking less alcohol. ?Your doctor may prescribe medicine if lifestyle changes do not help enough and if: ?Your top number is above 130. ?Your bottom number is above 80. ?Your personal target blood pressure may vary. ?Follow these instructions at home: ?Eating and drinking ? ?If told, follow the DASH eating plan. To follow this plan: ?Fill one half of your plate at each meal with fruits and vegetables. ?Fill one fourth of your plate  at each meal with whole grains. Whole grains include whole-wheat pasta, brown rice, and whole-grain bread. ?Eat or drink low-fat dairy products, such as skim milk or low-fat yogurt. ?Fill one fourth of your plate at each meal with low-fat (lean) proteins. Low-fat proteins include fish, chicken without skin, eggs, beans, and tofu. ?Avoid fatty meat, cured and processed meat, or chicken with skin. ?Avoid pre-made or processed food. ?Limit the amount of salt in your diet to less than 1,500 mg each day. ?Do not drink alcohol if: ?Your doctor tells you not to drink. ?You are pregnant, may be pregnant, or are planning to become pregnant. ?If you drink alcohol: ?Limit how much you have to: ?0-1 drink a day for women. ?0-2 drinks a day for men. ?Know how much alcohol is in your drink. In the [place] one drink equals one 12 oz bottle of beer (355 mL), one 5 oz glass of wine (148 mL), or one 1? oz glass of hard liquor (44 mL). ?Lifestyle ? ?Work with your doctor to stay at a healthy weight or to lose weight. Ask your doctor what the best weight is for you. ?Get at least 30 minutes of exercise that causes your heart to beat faster (aerobic exercise) most days of the week. This may include walking, swimming, or biking. ?Get at least 30 minutes of exercise that strengthens your muscles (resistance exercise) at least 3 days a week. This may include lifting weights or doing Pilates. ?Do not smoke or use any products that contain nicotine or tobacco. If you need help quitting, ask your doctor. ?Check your blood pressure at home as told by your doctor. ?Keep all follow-up visits. ?Medicines ?Take over-the-counter and prescription medicines   only as told by your doctor. Follow directions carefully. ?Do not skip doses of blood pressure medicine. The medicine does not work as well if you skip doses. Skipping doses also puts you at risk for problems. ?Ask your doctor about side effects or reactions to medicines that you should watch  for. ?Contact a doctor if: ?You think you are having a reaction to the medicine you are taking. ?You have headaches that keep coming back. ?You feel dizzy. ?You have swelling in your ankles. ?You have trouble with your vision. ?Get help right away if: ?You get a very bad headache. ?You start to feel mixed up (confused). ?You feel weak or numb. ?You feel faint. ?You have very bad pain in your: ?Chest. ?Belly (abdomen). ?You vomit more than once. ?You have trouble breathing. ?These symptoms may be an emergency. Get help right away. Call 911. ?Do not wait to see if the symptoms will go away. ?Do not drive yourself to the hospital. ?Summary ?Hypertension is another name for high blood pressure. ?High blood pressure forces your heart to work harder to pump blood. ?For most people, a normal blood pressure is less than 120/80. ?Making healthy choices can help lower blood pressure. If your blood pressure does not get lower with healthy choices, you may need to take medicine. ?This information is not intended to replace advice given to you by your health care provider. Make sure you discuss any questions you have with your health care provider. ?Document Revised: 10/25/2020 Document Reviewed: 10/25/2020 ?Elsevier Patient Education ? 2023 Elsevier Inc. ? ?

## 2021-12-24 NOTE — Progress Notes (Signed)
I,Victoria T Hamilton,acting as a Education administrator for [name], FNP.,have documented all relevant documentation on the behalf of [name], FNP,as directed by  [name], FNP while in the presence of [name], [place].    Subjective:     Patient ID: Alexandra [name] , female    DOB: September 01, 1965 , 56 y.o.   MRN: [id number]   Chief Complaint  Patient presents with   Hypertension    HPI  Patient here for a blood pressure f/u.  Continues to see Dr. Irene Limbo and provider at [facility]. She has been off her blood pressure medications for 2 months due to low blood pressures and doing well.   She reports not being on bp med for about 2 months. Her oncologist stated for her to discontinue being her readings were too low & she experienced dizziness.   Next appointment with oncologist Dec 19th..   She also states upcoming Car-T therapy procedure on christmas day. 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 The ultimate goal was to get to Car-T.   She will be in the hospital for one week and then stay for 3 weeks. She has 3 different friends taking some time off from work.   Continues to work from home. HgbA1c has been as high as 7 or 9.   Hypertension This is a chronic problem. The current episode started more than 1 year ago. The problem has been gradually worsening since onset. The problem is uncontrolled. Pertinent negatives include no anxiety, blurred vision, chest pain, headaches or palpitations. There are no associated agents to hypertension. Risk factors for coronary artery disease include obesity. Past treatments include diuretics and angiotensin blockers. The current treatment provides no improvement. There are no compliance problems.  There is no history of angina. There is no history of chronic renal disease.  Diabetes She presents for her follow-up diabetic visit. She has type 2 diabetes mellitus. No MedicAlert identification noted. Her disease course has been  stable. Pertinent negatives for hypoglycemia include no dizziness or headaches. Pertinent negatives for diabetes include no blurred vision, no chest pain, no fatigue, no polydipsia, no polyphagia and no polyuria. Symptoms are stable. There are no diabetic complications. Risk factors for coronary artery disease include hypertension and diabetes mellitus. Current diabetic treatments: Was on Victoza until May, no insurance since then. She is compliant with treatment all of the time. She is following a generally healthy diet. When asked about meal planning, she reported none. She has not had a previous visit with a dietitian. An ACE inhibitor/angiotensin II receptor blocker is being taken. She does not see a podiatrist.Eye exam is not current.     Past Medical History:  Diagnosis Date   Anxiety    Hypertension    Pre-diabetes      Family History  Problem Relation Age of Onset   Breast cancer Maternal Grandmother    Heart disease Mother    Hypertension Mother    Sarcoidosis Father    Diabetes Maternal Grandfather      Current Outpatient Medications:    aspirin EC 81 MG tablet, Take 1 tablet by mouth once daily., Disp: 90 tablet, Rfl: 1   atorvastatin (LIPITOR) 10 MG tablet, Take 1 tablet (10 mg total) by mouth daily., Disp: 30 tablet, Rfl: 1   docusate sodium (COLACE) 100 MG capsule, Take 100 mg by mouth 2 (two) times daily., Disp: , Rfl:    levocetirizine (XYZAL) 5 MG tablet, TAKE 1  TABLET BY MOUTH EVERY DAY IN THE EVENING, Disp: 90 tablet, Rfl: 3   OZEMPIC, 1 MG/DOSE, 4 MG/3ML SOPN, INJECT 1 MG UNDER THE SKIN ONCE WEEKLY, ON THE SAME DAY., Disp: 9 mL, Rfl: 1   pomalidomide (POMALYST) 2 MG capsule, TAKE 1 CAPSULE BY MOUTH ONCE DAILY FOR 21 DAYS ON AND 7 DAYS OFF, Disp: 21 capsule, Rfl: 0   POMALYST 2 MG capsule, TAKE 1 CAPSULE BY MOUTH ONCE DAILY FOR 21 DAYS ON AND 7 DAYS OFF, Disp: 21 capsule, Rfl: 0   valACYclovir (VALTREX) 500 MG tablet, Take 1 tablet by mouth 2 times a day, Disp: 60  tablet, Rfl: 10   hydrochlorothiazide (HYDRODIURIL) 12.5 MG tablet, Take 1 tablet by mouth daily as needed for leg swelling (Patient not taking: Reported on 10/30/2021), Disp: 90 tablet, Rfl: 1   traZODone (DESYREL) 50 MG tablet, Take 1 - 2 tablets (50 - 100 mg total) by mouth at bedtime as needed for sleep, Disp: 180 tablet, Rfl: 1   No Known Allergies   Review of Systems  Constitutional: Negative.  Negative for fatigue.  Eyes:  Negative for blurred vision.  Respiratory: Negative.    Cardiovascular: Negative.  Negative for chest pain and palpitations.  Endocrine: Negative for polydipsia, polyphagia and polyuria.  Neurological: Negative.  Negative for dizziness and headaches.  Psychiatric/Behavioral: Negative.       Today's Vitals   12/24/21 1557  BP: 110/74  Pulse: 82  Temp: 98.1 F (36.7 C)  SpO2: 94%  Weight: 175 lb 6.4 oz (79.6 kg)  Height: _0  (1.676 m)  PainSc: 0-No pain   Body mass index is 28.31 kg/m.  BP Readings from Last 3 Encounters:  12/24/21 110/74  12/11/21 106/81  12/04/21 128/71    Objective:  Physical Exam Vitals reviewed.  Constitutional:      General: She is not in acute distress.    Appearance: Normal appearance.  Cardiovascular:     Rate and Rhythm: Normal rate.     Pulses: Normal pulses.     Heart sounds: Normal heart sounds. No murmur heard. Pulmonary:     Effort: Pulmonary effort is normal. No respiratory distress.     Breath sounds: Normal breath sounds. No wheezing.  Neurological:     General: No focal deficit present.     Mental Status: She is alert and oriented to person, place, and time.     Cranial Nerves: No cranial nerve deficit.     Motor: No weakness.  Psychiatric:        Mood and Affect: Mood normal.        Behavior: Behavior normal.        Thought Content: Thought content normal.        Judgment: Judgment normal.         Assessment And Plan:     1. Type 2 diabetes mellitus without complication, without long-term  current use of insulin (HCC) Comments: HgbA1c is well controlled, continue current medications. - Hemoglobin A1c  2. Essential hypertension Comments: Blood pressure is well controlled, continue current medications  3. Mixed hyperlipidemia Comments: Cholesterol levels are stable. continue statin, tolerating well. - Lipid panel  4. Multiple myeloma, remission status unspecified ([place]) Comments: Continue f/u with Oncology at [facility].  5. Autologous bone marrow transplantation status [facility])    Patient was given opportunity to ask questions. Patient verbalized understanding of the plan and was able to repeat key elements of the plan. All questions were answered to their satisfaction.  [name], FNP   I, [name], FNP, have reviewed all documentation for this visit. The documentation on 12/24/21 for the exam, diagnosis, procedures, and orders are all accurate and complete.   IF YOU HAVE BEEN REFERRED TO A SPECIALIST, IT MAY TAKE 1-2 WEEKS TO SCHEDULE/PROCESS THE REFERRAL. IF YOU HAVE NOT HEARD FROM US/SPECIALIST IN TWO WEEKS, PLEASE GIVE [place] A CALL AT [phone] X [phone].   THE PATIENT IS ENCOURAGED TO PRACTICE SOCIAL DISTANCING DUE TO THE COVID-19 PANDEMIC.

## 2021-12-25 ENCOUNTER — Other Ambulatory Visit: Payer: 59

## 2021-12-25 ENCOUNTER — Ambulatory Visit: Payer: 59 | Admitting: Hematology

## 2021-12-25 ENCOUNTER — Ambulatory Visit: Payer: 59

## 2021-12-25 LAB — HEMOGLOBIN A1C
Est. average glucose Bld gHb Est-mCnc: 114 mg/dL
Hgb A1c MFr Bld: 5.6 % (ref 4.8–5.6)

## 2021-12-25 LAB — LIPID PANEL
Chol/HDL Ratio: 2.2 ratio (ref 0.0–4.4)
Cholesterol, Total: 134 mg/dL (ref 100–199)
HDL: 61 mg/dL (ref >39)
LDL Chol Calc (NIH): 61 mg/dL (ref 0–99)
Triglycerides: 56 mg/dL (ref 0–149)
VLDL Cholesterol Cal: 12 mg/dL (ref 5–40)

## 2021-12-26 ENCOUNTER — Ambulatory Visit
Admission: RE | Admit: 2021-12-26 | Discharge: 2021-12-26 | Disposition: A | Discharge status: Home / Self Care | Payer: 59 | Source: Ambulatory Visit | Attending: Radiation Oncology | Admitting: Radiation Oncology

## 2021-12-26 DIAGNOSIS — C9030 Solitary plasmacytoma not having achieved remission: Secondary | ICD-10-CM

## 2021-12-26 MED ORDER — GADOPICLENOL 0.5 MMOL/ML IV SOLN
7.0000 mL | Freq: Once | INTRAVENOUS | Status: AC | PRN
  Administered 2021-12-26: 7 mL via INTRAVENOUS

## 2021-12-30 ENCOUNTER — Inpatient Hospital Stay: Payer: Commercial Managed Care - PPO | Attending: Hematology

## 2021-12-31 ENCOUNTER — Encounter: Payer: Self-pay | Admitting: Urology

## 2021-12-31 NOTE — Progress Notes (Addendum)
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 Patient is to receive most recent scan results from 12/26/21 per Ashlyn Bruning PA-C. I verified patient's identity and began nursing interview. Patient reports doing well. No issues reported at this time.   Meaningful use complete. NO chances of pregnancy.   Patient aware of their 9:00am-01/01/22 telephone appointment w/ Ashlyn Bruning PA-C. I left my extension [phone] in case patient needs anything. Patient verbalized understanding. This concludes the nursing interview.   Patient contact [phone]     Leandra Kern, LPN

## 2022-01-01 ENCOUNTER — Other Ambulatory Visit: Payer: Self-pay | Admitting: Hematology

## 2022-01-01 ENCOUNTER — Ambulatory Visit
Admission: RE | Admit: 2022-01-01 | Discharge: 2022-01-01 | Disposition: A | Discharge status: Home / Self Care | Payer: 59 | Source: Ambulatory Visit | Attending: Urology | Admitting: Urology

## 2022-01-01 ENCOUNTER — Other Ambulatory Visit: Payer: Self-pay

## 2022-01-01 DIAGNOSIS — C9000 Multiple myeloma not having achieved remission: Secondary | ICD-10-CM

## 2022-01-01 DIAGNOSIS — C7951 Secondary malignant neoplasm of bone: Secondary | ICD-10-CM

## 2022-01-01 NOTE — Addendum Note (Signed)
Encounter addended by: Freeman Caldron, PA-C on: 01/01/2022 10:45 AM  Actions taken: Level of Service modified, Clinical Note Signed

## 2022-01-01 NOTE — Progress Notes (Addendum)
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   Interval Since Last Radiation:  6 months  06/03/21 - 06/14/21:   The recurrent tumor nodule in the left frontotemporal skull was treated to 30 Gy in 10 fractions and the cranioplasty surgical bed was simultaneously treated to 20 Gy in 10 fractions.   Narrative:  I contacted the patient to conduct her routine scheduled 3 month follow up visit to review the results of her recent MRI brain scan via telephone to spare the patient unnecessary potential exposure in the healthcare setting during the current COVID-19 pandemic.  The patient was notified in advance and gave permission to proceed with this visit format.   She tolerated radiation treatment relatively well without any acute ill side effects.                               On review of systems, she is doing very well in general and without complaints.  She tolerated the treatment very well with some mild alopecia as expected but otherwise denies any ill side effects associated with treatment.  She had been having some paresthesias and pain in the left foot but that resolved spontaneously.  She denies changes in visual or auditory acuity, difficulty with speech, imbalance, tremors or seizure activity.  She has continued on systemic therapy under the care of Dr. Irene Limbo but this is currently on hold while she is preparing for LB chemo shots on around December 21, 22, and 23, prior to CAR-T cell infusion on 01/14/22 at [facility]. She is still grieving the loss of her daughter this past summer.  She continues to take it 1 day at a time. She denies any paresthesias or focal weakness.  She had a recent posttreatment brain MRI on 12/26/2021 which  shows an excellent response to treatment, status post bifrontal craniectomy and cranioplasty, with the left temporalis mass no longer visualized, unchanged dural thickening and enhancement along the resection cavity and stable appearance of subcentimeter foci of enhancement within the calvarium with no new lesions identified.  We reviewed these results today.  ALLERGIES:  has No Known Allergies.  Meds: Current Outpatient Medications  Medication Sig Dispense Refill   aspirin EC 81 MG tablet Take 1 tablet by mouth once daily. 90 tablet 1   atorvastatin (LIPITOR) 10 MG tablet Take 1 tablet (10 mg total) by mouth daily. 30 tablet 1   docusate sodium (COLACE) 100 MG capsule Take 100 mg by mouth 2 (two) times daily.     hydrochlorothiazide (HYDRODIURIL) 12.5 MG tablet Take 1 tablet by mouth daily as needed for leg swelling (Patient not taking: Reported on 10/30/2021) 90 tablet 1   levocetirizine (XYZAL) 5 MG tablet TAKE 1 TABLET BY MOUTH EVERY DAY IN THE EVENING 90 tablet 3   OZEMPIC, 1 MG/DOSE, 4 MG/3ML SOPN INJECT 1 MG UNDER THE SKIN ONCE WEEKLY, ON THE SAME DAY. 9 mL 1   pomalidomide (POMALYST) 2 MG capsule TAKE 1 CAPSULE BY MOUTH ONCE DAILY FOR 21 DAYS ON AND 7 DAYS OFF 21 capsule 0   POMALYST 2 MG capsule TAKE 1 CAPSULE BY MOUTH ONCE DAILY FOR 21 DAYS ON  AND 7 DAYS OFF 21 capsule 0   traZODone (DESYREL) 50 MG tablet TAKE 1 TO 2 TABLETS BY MOUTH AT BEDTIME AS NEEDED FOR SLEEP 180 tablet 1   valACYclovir (VALTREX) 500 MG tablet Take 1 tablet by mouth 2 times a day 60 tablet 10   No current facility-administered medications for this encounter.    Physical Findings:  vitals were not taken for this visit.  Pain Assessment Pain Score: 0-No pain/10 Unable to assess due to telephone follow-up visit format.  Lab Findings: Lab Results  Component Value Date   WBC 4.4 12/19/2021   HGB 11.1 (L) 12/19/2021   HCT 31.7 (L) 12/19/2021   MCV 89.0 12/19/2021   PLT 226 12/19/2021     Radiographic  Findings: MR Brain W Wo Contrast  Result Date: 12/29/2021 CLINICAL DATA:  Follow-up, post treatment plasmacytoma EXAM: MRI HEAD WITHOUT AND WITH CONTRAST TECHNIQUE: Multiplanar, multiecho pulse sequences of the brain and surrounding structures were obtained without and with intravenous contrast. CONTRAST:  7 mL Vueway COMPARISON:  09/19/2021 FINDINGS: Brain: Status post bifrontal craniectomy and cranioplasty, with unchanged dural thickening and enhancement along the resection. Unchanged T2 hyperintense signal in the anterior left frontal lobe subjacent to the resection. Additional scattered T2 hyperintense signal in the periventricular white matter, likely the sequela of mild chronic small vessel ischemic disease. No acute infarct, hemorrhage, mass, mass effect, or midline shift. No hydrocephalus or extra-axial collection. No new abnormal parenchymal or meningeal enhancement. Vascular: Normal arterial flow voids. Normal arterial and venous enhancement. Skull and upper cervical spine: Prior bifrontal craniectomy and cranioplasty. Redemonstrated subcentimeter foci of enhancement within the calvarium. Otherwise normal marrow signal. Sinuses/Orbits: Clear paranasal sinuses. The orbits are unremarkable. Other: The mastoids are well aerated. IMPRESSION: 1. Status post bifrontal craniectomy and cranioplasty, with unchanged dural thickening and enhancement along the resection cavity. No new abnormal enhancement. 2. Unchanged subcentimeter foci of enhancement within the calvarium. Electronically Signed   By: Merilyn Baba M.D.   On: 12/29/2021 00:06    Impression/Plan: 1. 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   She has recovered well from the effects of her recent radiation and remains without complaints.  She has continued to tolerate her current systemic therapy with Isatuximab and Pomalidomide, under the care of Dr. Irene Limbo, but this is currently on hold as she is  preparing for LB chemo shots around December 21, 22, and 23, prior to CAR-T cell infusion on 01/14/22 at [facility].  Her recent posttreatment brain MRI from 12/26/2021 shows a very stable appearance and no new lesions identified.  We discussed the plan to continue to closely monitor with serial MRI brain scans every 3 months for the first year and pending these scans remain stable, we will then move to 6 month imaging.  We will follow-up by telephone following each scan to review results and recommendations from the multidisciplinary brain conference.  She appears to have a good understanding of these recommendations and is comfortable and in agreement with the stated plan.  She was advised to call at anytime with any questions or concerns in the interim.   I personally spent 20 minutes in this encounter including chart review, reviewing radiological studies, telephone conversation with the patient, entering orders and completing documentation.    Nicholos Johns, PA-C

## 2022-01-02 ENCOUNTER — Other Ambulatory Visit (HOSPITAL_COMMUNITY): Payer: Self-pay

## 2022-01-02 ENCOUNTER — Encounter: Payer: Self-pay | Admitting: Hematology

## 2022-01-02 ENCOUNTER — Other Ambulatory Visit: Payer: Self-pay

## 2022-01-02 MED ORDER — TRAZODONE HCL 50 MG PO TABS
50.0000 mg | ORAL_TABLET | Freq: Every evening | ORAL | 1 refills | Status: AC | PRN
  Filled 2022-01-02: qty 180, 90d supply, fill #0

## 2022-01-04 ENCOUNTER — Encounter: Payer: Self-pay | Admitting: Hematology

## 2022-01-08 ENCOUNTER — Ambulatory Visit: Payer: 59

## 2022-01-08 ENCOUNTER — Other Ambulatory Visit: Payer: 59

## 2022-01-20 DIAGNOSIS — I10 Essential (primary) hypertension: Secondary | ICD-10-CM | POA: Diagnosis not present

## 2022-01-20 DIAGNOSIS — Z95828 Presence of other vascular implants and grafts: Secondary | ICD-10-CM | POA: Diagnosis not present

## 2022-01-20 DIAGNOSIS — D89831 Cytokine release syndrome, grade 1: Secondary | ICD-10-CM | POA: Diagnosis not present

## 2022-01-20 DIAGNOSIS — Z5112 Encounter for antineoplastic immunotherapy: Secondary | ICD-10-CM | POA: Diagnosis not present

## 2022-01-20 DIAGNOSIS — Z9484 Stem cells transplant status: Secondary | ICD-10-CM | POA: Diagnosis not present

## 2022-01-20 DIAGNOSIS — E8809 Other disorders of plasma-protein metabolism, not elsewhere classified: Secondary | ICD-10-CM | POA: Diagnosis not present

## 2022-01-20 DIAGNOSIS — C9000 Multiple myeloma not having achieved remission: Secondary | ICD-10-CM | POA: Diagnosis not present

## 2022-01-20 DIAGNOSIS — R7303 Prediabetes: Secondary | ICD-10-CM | POA: Diagnosis not present

## 2022-01-20 DIAGNOSIS — D630 Anemia in neoplastic disease: Secondary | ICD-10-CM | POA: Diagnosis not present

## 2022-01-20 DIAGNOSIS — D709 Neutropenia, unspecified: Secondary | ICD-10-CM | POA: Diagnosis not present

## 2022-01-21 DIAGNOSIS — C9000 Multiple myeloma not having achieved remission: Secondary | ICD-10-CM | POA: Diagnosis not present

## 2022-01-22 ENCOUNTER — Ambulatory Visit: Payer: Commercial Managed Care - PPO

## 2022-01-22 ENCOUNTER — Other Ambulatory Visit: Payer: Commercial Managed Care - PPO

## 2022-01-22 ENCOUNTER — Ambulatory Visit: Payer: Commercial Managed Care - PPO | Admitting: Physician Assistant

## 2022-01-22 DIAGNOSIS — C9000 Multiple myeloma not having achieved remission: Secondary | ICD-10-CM | POA: Diagnosis not present

## 2022-01-23 DIAGNOSIS — Z79899 Other long term (current) drug therapy: Secondary | ICD-10-CM | POA: Diagnosis not present

## 2022-01-23 DIAGNOSIS — C9000 Multiple myeloma not having achieved remission: Secondary | ICD-10-CM | POA: Diagnosis not present

## 2022-01-24 DIAGNOSIS — C9000 Multiple myeloma not having achieved remission: Secondary | ICD-10-CM | POA: Diagnosis not present

## 2022-01-25 DIAGNOSIS — C9000 Multiple myeloma not having achieved remission: Secondary | ICD-10-CM | POA: Diagnosis not present

## 2022-01-26 DIAGNOSIS — C9000 Multiple myeloma not having achieved remission: Secondary | ICD-10-CM | POA: Diagnosis not present

## 2022-01-27 DIAGNOSIS — C9000 Multiple myeloma not having achieved remission: Secondary | ICD-10-CM | POA: Diagnosis not present

## 2022-01-28 DIAGNOSIS — C9000 Multiple myeloma not having achieved remission: Secondary | ICD-10-CM | POA: Diagnosis not present

## 2022-01-29 DIAGNOSIS — C9000 Multiple myeloma not having achieved remission: Secondary | ICD-10-CM | POA: Diagnosis not present

## 2022-01-31 DIAGNOSIS — C9000 Multiple myeloma not having achieved remission: Secondary | ICD-10-CM | POA: Diagnosis not present

## 2022-02-03 DIAGNOSIS — C9000 Multiple myeloma not having achieved remission: Secondary | ICD-10-CM | POA: Diagnosis not present

## 2022-02-05 ENCOUNTER — Ambulatory Visit: Payer: Commercial Managed Care - PPO

## 2022-02-05 ENCOUNTER — Other Ambulatory Visit: Payer: Commercial Managed Care - PPO

## 2022-02-05 DIAGNOSIS — C9000 Multiple myeloma not having achieved remission: Secondary | ICD-10-CM | POA: Diagnosis not present

## 2022-02-07 ENCOUNTER — Other Ambulatory Visit: Payer: Self-pay | Admitting: Radiation Therapy

## 2022-02-07 ENCOUNTER — Other Ambulatory Visit (HOSPITAL_COMMUNITY): Payer: Self-pay

## 2022-02-07 ENCOUNTER — Encounter: Payer: Self-pay | Admitting: Hematology

## 2022-02-07 DIAGNOSIS — C7931 Secondary malignant neoplasm of brain: Secondary | ICD-10-CM

## 2022-02-07 DIAGNOSIS — C9000 Multiple myeloma not having achieved remission: Secondary | ICD-10-CM | POA: Diagnosis not present

## 2022-02-07 DIAGNOSIS — C9030 Solitary plasmacytoma not having achieved remission: Secondary | ICD-10-CM

## 2022-02-10 DIAGNOSIS — C9000 Multiple myeloma not having achieved remission: Secondary | ICD-10-CM | POA: Diagnosis not present

## 2022-02-11 ENCOUNTER — Telehealth: Payer: Self-pay | Admitting: *Deleted

## 2022-02-11 NOTE — Telephone Encounter (Signed)
"  Alexandra [name] [phone]).  I brought in request for records however the insurance claim has been denied.  Misinformation of admissions was received so, I need to obtain the records.  [employer] ([employer]) advised me to call you.  Previously you've helped me for FMLA."  No record requests noted for Marguerita Merles.  Advised [facility] and ([place]) [facility] are not the same.  During call learned request is for daughter's records who expired 07/26/2021, was not a [facility] patient.  This nurse unable to to provide records or letters.  No records or information to release per [facility].I.M. release letters sent to [employer].  Provided *(SW) H.I.M. no.# [phone], listen carefully to follow prompts or note phone number specific to your need to connect with a representative for next steps.     Currently no further questions or needs.

## 2022-02-12 ENCOUNTER — Other Ambulatory Visit (HOSPITAL_COMMUNITY): Payer: Self-pay

## 2022-02-12 DIAGNOSIS — C9000 Multiple myeloma not having achieved remission: Secondary | ICD-10-CM | POA: Diagnosis not present

## 2022-02-12 DIAGNOSIS — Z452 Encounter for adjustment and management of vascular access device: Secondary | ICD-10-CM | POA: Diagnosis not present

## 2022-02-12 MED ORDER — LEVETIRACETAM 500 MG PO TABS
500.0000 mg | ORAL_TABLET | Freq: Two times a day (BID) | ORAL | 0 refills | Status: DC
  Filled 2022-02-12: qty 60, 30d supply, fill #0

## 2022-02-14 ENCOUNTER — Other Ambulatory Visit: Payer: Self-pay | Admitting: Radiation Therapy

## 2022-02-14 DIAGNOSIS — C7931 Secondary malignant neoplasm of brain: Secondary | ICD-10-CM

## 2022-02-14 NOTE — Progress Notes (Signed)
Orders placed for port access and de-access the day of brain MRI at [facility].   Mont Dutton R.T.(R)(T) Radiation Special Procedures Navigator

## 2022-02-19 ENCOUNTER — Ambulatory Visit: Payer: Commercial Managed Care - PPO

## 2022-02-19 ENCOUNTER — Telehealth: Payer: Self-pay | Admitting: Radiation Therapy

## 2022-02-19 ENCOUNTER — Other Ambulatory Visit: Payer: Commercial Managed Care - PPO

## 2022-02-19 ENCOUNTER — Ambulatory Visit: Payer: Commercial Managed Care - PPO | Admitting: Hematology

## 2022-02-19 NOTE — Telephone Encounter (Signed)
I spoke with Alexandra [name] about her upcoming brain MRI and telephone follow-up with Ashlyn IN March. She has this information written down and plans to attend.   Mont Dutton R.T.(R)(T) Radiation Special Procedures Navigator

## 2022-02-20 ENCOUNTER — Other Ambulatory Visit: Payer: Self-pay

## 2022-02-20 ENCOUNTER — Inpatient Hospital Stay: Payer: Commercial Managed Care - PPO | Attending: Hematology

## 2022-02-20 DIAGNOSIS — C9000 Multiple myeloma not having achieved remission: Secondary | ICD-10-CM

## 2022-02-20 DIAGNOSIS — C9001 Multiple myeloma in remission: Secondary | ICD-10-CM | POA: Diagnosis not present

## 2022-02-20 LAB — CBC WITH DIFFERENTIAL (CANCER CENTER ONLY)
Abs Immature Granulocytes: 0 10*3/uL (ref 0.00–0.07)
Basophils Absolute: 0 10*3/uL (ref 0.0–0.1)
Basophils Relative: 1 %
Eosinophils Absolute: 0.1 10*3/uL (ref 0.0–0.5)
Eosinophils Relative: 5 %
HCT: 31.9 % — ABNORMAL LOW (ref 36.0–46.0)
Hemoglobin: 11 g/dL — ABNORMAL LOW (ref 12.0–15.0)
Immature Granulocytes: 0 %
Lymphocytes Relative: 43 %
Lymphs Abs: 1.1 10*3/uL (ref 0.7–4.0)
MCH: 30.2 pg (ref 26.0–34.0)
MCHC: 34.5 g/dL (ref 30.0–36.0)
MCV: 87.6 fL (ref 80.0–100.0)
Monocytes Absolute: 0.5 10*3/uL (ref 0.1–1.0)
Monocytes Relative: 22 %
Neutro Abs: 0.7 10*3/uL — ABNORMAL LOW (ref 1.7–7.7)
Neutrophils Relative %: 29 %
Platelet Count: 134 10*3/uL — ABNORMAL LOW (ref 150–400)
RBC: 3.64 MIL/uL — ABNORMAL LOW (ref 3.87–5.11)
RDW: 18 % — ABNORMAL HIGH (ref 11.5–15.5)
WBC Count: 2.5 10*3/uL — ABNORMAL LOW (ref 4.0–10.5)
nRBC: 0 % (ref 0.0–0.2)

## 2022-02-20 LAB — CMP (CANCER CENTER ONLY)
ALT: 30 U/L (ref 0–44)
AST: 24 U/L (ref 15–41)
Albumin: 4 g/dL (ref 3.5–5.0)
Alkaline Phosphatase: 41 U/L (ref 38–126)
Anion gap: 7 (ref 5–15)
BUN: 13 mg/dL (ref 6–20)
CO2: 28 mmol/L (ref 22–32)
Calcium: 9.4 mg/dL (ref 8.9–10.3)
Chloride: 106 mmol/L (ref 98–111)
Creatinine: 0.95 mg/dL (ref 0.44–1.00)
GFR, Estimated: >60 mL/min (ref >60)
Glucose, Bld: 87 mg/dL (ref 70–99)
Potassium: 4.5 mmol/L (ref 3.5–5.1)
Sodium: 141 mmol/L (ref 135–145)
Total Bilirubin: 0.5 mg/dL (ref 0.3–1.2)
Total Protein: 6 g/dL — ABNORMAL LOW (ref 6.5–8.1)

## 2022-02-27 ENCOUNTER — Other Ambulatory Visit: Payer: Self-pay | Admitting: Nurse Practitioner

## 2022-02-27 DIAGNOSIS — C9000 Multiple myeloma not having achieved remission: Secondary | ICD-10-CM

## 2022-02-28 ENCOUNTER — Other Ambulatory Visit (HOSPITAL_COMMUNITY): Payer: Self-pay

## 2022-02-28 MED ORDER — OZEMPIC (1 MG/DOSE) 4 MG/3ML ~~LOC~~ SOPN
PEN_INJECTOR | SUBCUTANEOUS | 1 refills | Status: DC
  Filled 2022-02-28: qty 9, 84d supply, fill #0

## 2022-02-28 MED ORDER — ATORVASTATIN CALCIUM 10 MG PO TABS
10.0000 mg | ORAL_TABLET | Freq: Every day | ORAL | 1 refills | Status: DC
  Filled 2022-02-28: qty 90, 90d supply, fill #0
  Filled 2022-07-04 (×2): qty 90, 90d supply, fill #1

## 2022-03-03 ENCOUNTER — Other Ambulatory Visit: Payer: Self-pay

## 2022-03-03 DIAGNOSIS — C9000 Multiple myeloma not having achieved remission: Secondary | ICD-10-CM

## 2022-03-04 ENCOUNTER — Other Ambulatory Visit: Payer: Self-pay

## 2022-03-04 ENCOUNTER — Inpatient Hospital Stay (HOSPITAL_BASED_OUTPATIENT_CLINIC_OR_DEPARTMENT_OTHER): Payer: Commercial Managed Care - PPO | Admitting: Hematology

## 2022-03-04 ENCOUNTER — Inpatient Hospital Stay: Payer: Commercial Managed Care - PPO

## 2022-03-04 ENCOUNTER — Other Ambulatory Visit: Payer: Commercial Managed Care - PPO

## 2022-03-04 VITALS — BP 121/66 | HR 62 | Temp 97.5°F | Resp 20 | Wt 179.9 lb

## 2022-03-04 DIAGNOSIS — C9000 Multiple myeloma not having achieved remission: Secondary | ICD-10-CM

## 2022-03-04 DIAGNOSIS — C9001 Multiple myeloma in remission: Secondary | ICD-10-CM | POA: Diagnosis not present

## 2022-03-04 LAB — CMP (CANCER CENTER ONLY)
ALT: 15 U/L (ref 0–44)
AST: 18 U/L (ref 15–41)
Albumin: 4 g/dL (ref 3.5–5.0)
Alkaline Phosphatase: 40 U/L (ref 38–126)
Anion gap: 6 (ref 5–15)
BUN: 16 mg/dL (ref 6–20)
CO2: 28 mmol/L (ref 22–32)
Calcium: 9 mg/dL (ref 8.9–10.3)
Chloride: 106 mmol/L (ref 98–111)
Creatinine: 0.87 mg/dL (ref 0.44–1.00)
GFR, Estimated: >60 mL/min (ref >60)
Glucose, Bld: 89 mg/dL (ref 70–99)
Potassium: 4.1 mmol/L (ref 3.5–5.1)
Sodium: 140 mmol/L (ref 135–145)
Total Bilirubin: 0.5 mg/dL (ref 0.3–1.2)
Total Protein: 5.6 g/dL — ABNORMAL LOW (ref 6.5–8.1)

## 2022-03-04 LAB — CBC WITH DIFFERENTIAL (CANCER CENTER ONLY)
Abs Immature Granulocytes: 0 10*3/uL (ref 0.00–0.07)
Basophils Absolute: 0 10*3/uL (ref 0.0–0.1)
Basophils Relative: 1 %
Eosinophils Absolute: 0.1 10*3/uL (ref 0.0–0.5)
Eosinophils Relative: 4 %
HCT: 32.6 % — ABNORMAL LOW (ref 36.0–46.0)
Hemoglobin: 11.2 g/dL — ABNORMAL LOW (ref 12.0–15.0)
Immature Granulocytes: 0 %
Lymphocytes Relative: 51 %
Lymphs Abs: 1.1 10*3/uL (ref 0.7–4.0)
MCH: 29.7 pg (ref 26.0–34.0)
MCHC: 34.4 g/dL (ref 30.0–36.0)
MCV: 86.5 fL (ref 80.0–100.0)
Monocytes Absolute: 0.5 10*3/uL (ref 0.1–1.0)
Monocytes Relative: 24 %
Neutro Abs: 0.5 10*3/uL — ABNORMAL LOW (ref 1.7–7.7)
Neutrophils Relative %: 20 %
Platelet Count: 158 10*3/uL (ref 150–400)
RBC: 3.77 MIL/uL — ABNORMAL LOW (ref 3.87–5.11)
RDW: 16.8 % — ABNORMAL HIGH (ref 11.5–15.5)
WBC Count: 2.2 10*3/uL — ABNORMAL LOW (ref 4.0–10.5)
nRBC: 0 % (ref 0.0–0.2)

## 2022-03-04 LAB — PHOSPHORUS: Phosphorus: 4.8 mg/dL — ABNORMAL HIGH (ref 2.5–4.6)

## 2022-03-04 LAB — MAGNESIUM: Magnesium: 1.6 mg/dL — ABNORMAL LOW (ref 1.7–2.4)

## 2022-03-04 NOTE — Progress Notes (Signed)
HEMATOLOGY/ONCOLOGY CLNIC NOTE  Date of Service: 03/04/22   Patient Care Team: [name], FNP as PCP - General (General Practice)  CHIEF COMPLAINTS/PURPOSE OF CONSULTATION:  Follow-up for continued evaluation and management of multiple myeloma.  HISTORY OF PRESENTING ILLNESS:   Please see previous note for details   INTERVAL HISTORY: Alexandra [name] is here for continued evaluation and management of her multiple myeloma.    Patient was last seen by me on 12/02/2021 and she complained of bilateral shoulder joint pain due to aggressive shoulder movement. She reported that she got  her CAR-T cell infusion in December 2023.   Patient reports she has been doing well overall without any new medical concerns. She reports she had had her CAR-T cell infusion in December and is around 60 days out. Patient notes she has been doing well after the CAR-T infusion, but complains of bilateral knee joint pain. She also complains of mild bilateral hand neuropathy in the morning. She notes she has been eating well after her CAR-T cell infusion as well.   Patient is currently taking Levaquin 500 mg and Bactrim. She has not been taking her vitamin supplements since the CAR-T infusion.   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   She denies fever, chills, night sweats, fatigue, abnormal bowel movement, abdominal pain, bone pain, back pain, chest pain, or leg swelling.    ONCOLOGIC HISTORY   Lambda Light Chain Multiple Myeloma  A. 09/27/19: CT Maxillofacial enhancing dural-based anterior cranial fossa mass traversing the calvarium and extending into the extracalvarial soft tissues along with a 7.4 mm rightward midline shift at the level of anterior cranial fossa. MRI head with and without contrast is recommended for further evaluation. B. 10/05/19: MRI Head large extra-axial mass anterior left frontal region with bone erosion and extension into the scalp soft  tissues. Considerations include hemangiopericytoma, metastasis, and aggressive hemangioma. 2.6 mm enhancing lesion in the clivus.  C. 10/10/19: Left craniectomy with frontal brain tumor/bone resection, path + plasma cell neoplasm, lambda restricted, plasmacytoma favored. Hgb 9.9,  D. 10/31/19: Initial Hem-Onc consultation; M-spike 0; IgG 627, IgQ 51, IgM 11; SFLC: Kappa 1.12 mg/dL, Lambda 154.50, ratio 0.01; IFE: serum + monoclonal free Lambda light chain; Hgb 10.1,  E. 11/04/19: 24 hour UPEP Total protein 1318, Free lambda light chains Ur 1332.46, ratio 0.02, M-spike 27.4, M-spike 24 hr at 361; Bence Jones positive, Lambda type F. 11/08/19: BMBx 22% atypical plasma cells, 30% by IHC; FISH Dup(1q) and Del(13q) detected G. 11/10/19: PET CT no metabolic activity within the skeleton to localize active myeloma; no lytic lesions identified; no abnormal activity at the left craniectomy site; no evidence of soft tissue plasmacytoma. H. 12/05/19: Started KRD; SFLC: Kappa 1.42, Lambda 177.97, ratio 0.01; Hgb 11.0 I. 12//13/21: SFLC: Kappa 1.45, Lambda 8.92, ratio 0.16; Hgb 11.1 J. 02/06/20: SFLC: Kappa 1.10, Lambda 2.08, ratio 0.53; Hgb 11.3 K. 02/26/20: Cycle 4 of KRD; SFLC: Kappa 1.14, Lambda 1.90, ratio 0.60; Hgb 10.8 L. 04/02/20: SFLC: Kappa 1.05, Lambda 1.56, ratio 0.67; Hgb 11.2 M. 04/30/20: SFLC: Kappa 1.01, Lambda 1.40, ratio 0.72; Hgb 11.5  N. 05/05/20: MRI Brain no evidence of left frontal bone lesion recurrence; Multiple subcentimeter area of nodular enhancement in the calvarium are nonspecific but could related to myeloma given nonvisualization on prior. O. 05/11/20: PET CT no findings to suggest FDG avid lesions of multiple myeloma. P. 05/14/20: BMBx 1% plasma cells Q. 05/28/20: SFLC: Kappa 0.84, Lambda 1.16, ratio  0.72; Hgb 11.6  R. 06/11/20: EKG QT 408/QTc 424; PFT's: FEV1 110%, DLCO 58.8% predicted, 64.4% hgb corrected; ECHO: EF 55% est, 61% calc; GLS -20%; SFLC: Kappa 1.16, Lambda 1.40, ratio 0.83; Hgb  10.9-VGPR S. 07/06/20: SFLC: Kappa 1.16, Lambda 2.14, ratio 0.54; . Hgb 11.7, Plt 118 T. 6/20-7/12/22: Hospital admission for Melphalan 200 mg/m2 followed by reinfusion of 4.71 x 10^6/kg CD34+ autologous cells  MEDICAL HISTORY:  Past Medical History:  Diagnosis Date   Anxiety    Hypertension    Pre-diabetes     SURGICAL HISTORY: Past Surgical History:  Procedure Laterality Date   ABLATION     APPLICATION OF CRANIAL NAVIGATION N/A 10/10/2019   Procedure: APPLICATION OF CRANIAL NAVIGATION;  Surgeon: Consuella Lose, MD;  Location: [place];  Service: Neurosurgery;  Laterality: N/A;  APPLICATION OF CRANIAL NAVIGATION   CRANIOPLASTY Left 10/14/2019   Procedure: LEFT CRANIOPLASTY WITH PLACEMENT OF CUSTOM CRANIAL IMPLANT;  Surgeon: Consuella Lose, MD;  Location: [place];  Service: Neurosurgery;  Laterality: Left;   CRANIOTOMY Left 10/10/2019   Procedure: Left frontal Stereotactic craniectomy for resection of tumor;  Surgeon: Consuella Lose, MD;  Location: [place];  Service: Neurosurgery;  Laterality: Left;  Left frontal Stereotactic craniectomy for resection of tumor   HAMMER TOE SURGERY Left    IR IMAGING GUIDED PORT INSERTION  12/01/2019   IR IMAGING GUIDED PORT INSERTION  08/06/2021   TUBAL LIGATION      SOCIAL HISTORY: Social History   Socioeconomic History   Marital status: Single    Spouse name: Not on file   Number of children: Not on file   Years of education: Not on file   Highest education level: 12th grade  Occupational History   Not on file  Tobacco Use   Smoking status: Never   Smokeless tobacco: Never  Vaping Use   Vaping Use: Never used  Substance and Sexual Activity   Alcohol use: Yes    Comment: occassionally   Drug use: Not Currently   Sexual activity: Not on file  Other Topics Concern   Not on file  Social History Narrative   Not on file   Social Determinants of Health   Financial Resource Strain: Medium Risk (08/13/2020)   Overall Financial  Resource Strain (CARDIA)    Difficulty of Paying Living Expenses: Somewhat hard  Food Insecurity: Food Insecurity Present (08/13/2020)   Hunger Vital Sign    Worried About Running Out of Food in the Last Year: Sometimes true    Ran Out of Food in the Last Year: Never true  Transportation Needs: Unmet Transportation Needs (08/13/2020)   PRAPARE - Transportation    Lack of Transportation (Medical): No    Lack of Transportation (Non-Medical): Yes  Physical Activity: Sufficiently Active (08/13/2020)   Exercise Vital Sign    Days of Exercise per Week: 5 days    Minutes of Exercise per Session: 30 min  Stress: No Stress Concern Present (08/13/2020)   [employer] [place]    Feeling of Stress : Only a little  Social Connections: Moderately Integrated (08/13/2020)   Social Connection and Isolation Panel [NHANES]    Frequency of Communication with Friends and Family: More than three times a week    Frequency of Social Gatherings with Friends and Family: Once a week    Attends Religious Services: More than 4 times per year    Active Member of [employer] or Organizations: Yes    Attends Archivist  Meetings: 1 to 4 times per year    Marital Status: Divorced  Human resources officer Violence: Not on file    FAMILY HISTORY: Family History  Problem Relation Age of Onset   Breast cancer Maternal Grandmother    Heart disease Mother    Hypertension Mother    Sarcoidosis Father    Diabetes Maternal Grandfather     ALLERGIES:  has No Known Allergies.  MEDICATIONS:  Current Outpatient Medications  Medication Sig Dispense Refill   aspirin EC 81 MG tablet Take 1 tablet by mouth once daily. 90 tablet 1   atorvastatin (LIPITOR) 10 MG tablet Take 1 tablet (10 mg total) by mouth daily. 90 tablet 1   docusate sodium (COLACE) 100 MG capsule Take 100 mg by mouth 2 (two) times daily.     hydrochlorothiazide (HYDRODIURIL) 12.5 MG tablet Take 1 tablet by  mouth daily as needed for leg swelling (Patient not taking: Reported on 10/30/2021) 90 tablet 1   levETIRAcetam (KEPPRA) 500 MG tablet Take 1 tablet (500 mg total) by mouth every 12 (twelve) hours. Start on day prior to CAR-T cell infusion and continue for 60 days after CAR-T cell infusion. 60 tablet 0   levocetirizine (XYZAL) 5 MG tablet TAKE 1 TABLET BY MOUTH EVERY DAY IN THE EVENING 90 tablet 3   OZEMPIC, 1 MG/DOSE, 4 MG/3ML SOPN INJECT 1 MG UNDER THE SKIN ONCE WEEKLY, ON THE SAME DAY. 9 mL 1   pomalidomide (POMALYST) 2 MG capsule TAKE 1 CAPSULE BY MOUTH ONCE DAILY FOR 21 DAYS ON AND 7 DAYS OFF 21 capsule 0   POMALYST 2 MG capsule TAKE 1 CAPSULE BY MOUTH ONCE DAILY FOR 21 DAYS ON AND 7 DAYS OFF 21 capsule 0   traZODone (DESYREL) 50 MG tablet Take 1 - 2 tablets (50 - 100 mg total) by mouth at bedtime as needed for sleep 180 tablet 1   valACYclovir (VALTREX) 500 MG tablet Take 1 tablet by mouth 2 times a day 60 tablet 10   No current facility-administered medications for this visit.    REVIEW OF SYSTEMS:   10 Point review of Systems was done is negative except as noted above.  PHYSICAL EXAMINATION: ECOG PERFORMANCE STATUS: 1 - Symptomatic but completely ambulatory .Marland KitchenLMP 07/09/2015   NAD GENERAL:alert, in no acute distress and comfortable SKIN: no acute rashes, no significant lesions EYES: conjunctiva are pink and non-injected, sclera anicteric OROPHARYNX: MMM, no exudates, no oropharyngeal erythema or ulceration NECK: supple, no JVD LYMPH:  no palpable lymphadenopathy in the cervical, axillary or inguinal regions LUNGS: clear to auscultation b/l with normal respiratory effort HEART: regular rate & rhythm ABDOMEN:  normoactive bowel sounds , non tender, not distended. Extremity: no pedal edema PSYCH: alert & oriented x 3 with fluent speech NEURO: no focal motor/sensory deficits   LABORATORY DATA:  I have reviewed the data as listed     Latest Ref Rng & Units 03/04/2022    8:34  AM 02/20/2022    4:05 PM 12/19/2021    3:52 PM  CBC  WBC 4.0 - 10.5 K/uL 2.2  2.5  4.4   Hemoglobin 12.0 - 15.0 g/dL 11.2  11.0  11.1   Hematocrit 36.0 - 46.0 % 32.6  31.9  31.7   Platelets 150 - 400 K/uL 158  134  226   ANC 500     Latest Ref Rng & Units 03/04/2022    8:34 AM 02/20/2022    4:05 PM 12/19/2021    3:52 PM  CMP  Glucose 70 - 99 mg/dL 89  87  102   BUN 6 - 20 mg/dL 16  13  14   $ Creatinine 0.44 - 1.00 mg/dL 0.87  0.95  0.73   Sodium 135 - 145 mmol/L 140  141  139   Potassium 3.5 - 5.1 mmol/L 4.1  4.5  3.9   Chloride 98 - 111 mmol/L 106  106  106   CO2 22 - 32 mmol/L 28  28  29   $ Calcium 8.9 - 10.3 mg/dL 9.0  9.4  9.6   Total Protein 6.5 - 8.1 g/dL 5.6  6.0  6.2   Total Bilirubin 0.3 - 1.2 mg/dL 0.5  0.5  0.4   Alkaline Phos 38 - 126 U/L 40  41  49   AST 15 - 41 U/L 18  24  11   $ ALT 0 - 44 U/L 15  30  12     $ 11/08/2019 FISH Plasma Cell Myeloma Prognostic Panel:    11/08/2019 Cytogenetics:    11/08/2019 Bone Marrow Report [phone]):   10/10/2019 Surgical Pathology [phone]):   05/14/2020 Surgical Pathology "-Normocellular bone marrow for age with trilineage hematopoiesis and 1%  plasma cells  -See comment   PERIPHERAL BLOOD:  -Normocytic-normochromic anemia  -Leukopenia   COMMENT:   The bone marrow is generally normocellular for age with trilineage  hematopoiesis but with predominance of erythroid precursors.  The  myeloid changes are not considered specific and likely related to  therapy.  In this background, the plasma cells represent 1% of all cells  associated with scattered small clusters and display slight lambda light  chain excess.  The findings are limited but most suggestive of minimal  residual plasma cell neoplasm.  Correlation with cytogenetic and FISH  studies is recommended. "  RADIOGRAPHIC STUDIES:  I have personally reviewed the radiological images as listed and agreed with the findings in the report. No results  found.   Bone marrow biopsy on 10/14/2021:  DIAGNOSIS    A-C. Bone marrow, aspirate smear, touch prep, clot section, core biopsy and peripheral blood smear: - Plasma cell neoplasm, based on 3% atypical plasma cells on aspirate smear and MRD flow cytometry results with 0.2% abnormal lambda-monotypic plasma cells - Aspirate smear with erythroid predominance, no increase in blasts - Core biopsy and clot section limited for evaluation; touch prep unsatisfactory for evaluation - Congo Red stain negative for amyloid deposition - See comment  Electronically signed by Brandon Melnick, MD on 10/17/2021 at  6:35 PM  Diagnostic Comment    Corresponding flow cytometry analysis, [id number], shows a plasma cell population with aberrant immunophenotype comprising 0.2% of analyzed events, supporting the diagnosis above.     PET scan on 10/14/2021: IMPRESSION:   1.  No definite hypermetabolic disease.   2.  Indeterminate focus of hypermetabolic activity in the right proximal  arm musculature which is favored posttraumatic or related to spasm. This is  without CT correlate, but assessment is limited given lack of IV contrast.  Recommend attention on follow-up given the focal nature of this activity.    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 No M spike. Lambda light chain myeloma + Anemia + bone lesion on skull-- resected No renal failure,  hypercalcemia  BMBx- 22-30% lambda restricted plasma cell MolCy Dup (1q), del (13q)  Completed 7 cycles of KRD  The pt recently had a transplant at [facility]. DISEASE Lambda Light Chain Multiple Myeloma  - She will receive Melphalan 200 mg/m2 conditioning regimen, followed by autologous stem  cell rescue (see details below).  TRANSPLANT: Preparative regimen:  - Melphalan 200 mg/m2 (379m) IV: 07/09/20  Stem Cells:  - Autologous stem cell reinfusion on 07/10/20. Patient received 4.71 x 10^6 CD34+/kg. - Her course was complicated by neutropenic fevers and Bcx 6/27 positive for E coli. She received IV cefepime from 6/27 until 6/30, which cefepime was switched to zosyn in the setting of new abdominal pain. CT abdomen/pelvis was performed that showed acute uncomplicated multifocal diverticulitis involving the transverse and descending colon. She was treated with bowel rest, prn pain medication, and IV zosyn, which was continued until engraftment on 7/5 and was transitioned to ppx ceftriaxone. Unfortunately, with the transition to ceftriaxone she had recurrent fevers and as a result was restarted on zosyn. Zosyn was transitioned to oral cipro/flagyl on 7/10 with the plan to continue for a 7 day course.    Bone marrow biopsy done on 01/18/2021 showed normocellular bone marrow with no morphologic evidence of plasma cell neoplasm. 24-hour UPEP on 01/22/2021 showed unremarkable immunofixation pattern and no monoclonal protein. PET CT scan 01/16/2021-negative for FDG avid lesions of myeloma MRI brain 01/12/2021-showed no overt active myeloma lesions.  S/p CAR-T therapy - She received Flu/Cy lymph-depletion, followed by CAR-T ([place]) infusion.  TRANSPLANT: Preparative Regimen:  - Fludarabine (30 mg/m2) 60 mg IV on 12/2121 - 1223/23 (Day -5 thru Day -3). - Cytoxan (300 mg/m2) 600 mg IV on 01/09/22 - 01/11/22 (Day -5 thru Day -3).  Stem Cells:  - CAR-T cell reinfusion on 01/14/22    3) prediabetes 4) hypertension 5) anxiety 6) dyslipidemia 7) s/p ABnormal LFts -- azole toxicity vs CRS PLAN: -reviewed outside records regarding her course with Car-T therapy. -Discussed lab results from today, 03/04/2022, with the patient. CBC shows decreased WBC of 2.2 ANC 500, hemoglobin of 11.2, and  hematocrit of 32.6. CMP is stable.  -Recommended to follow-up with her transplant team  regarding vitamin supplements.  - no indication for transfusion support at this time. -[facility]:  - Bacterial: Levaquin 5048mpo daily (start with LD chemotherapy); will continue through ANGlen Campbell1000. - Viral: Acyclovir 40058mo BID (start with LD chemotherapy); will continue for 6 months s/p CAR-T treatment (can then d/c if CD4 >200). - Fungal: Fluconazole 400m40mily (start with LD chemotherapy); will continue through ANC Alexis00. - PCP: Bactrim DS 1 tab po Mon/Wed/Fri (start with LD chemotherapy); will continue for 6 months s/p CAR-T treatment (can then d/c if CD4 >200).   FOLLOW-UP: RTC with Dr KaleIrene Limboh labs in 4 weeks  The total time spent in the appointment was 40 minutes* .  All of the patient's questions were answered with apparent satisfaction. The patient knows to call the clinic with any problems, questions or concerns.   GautSullivan LoneMS AAHIVMS SCH [facility] LLCatology/Oncology Physician [facility] ShoreTotal Encounter Time as defined by the Centers for Medicare and Medicaid Services includes, in addition to the face-to-face time of a patient visit (documented in the note above) non-face-to-face time: obtaining and reviewing outside history, ordering and reviewing medications, tests or procedures, care coordination (communications with other health care professionals or caregivers) and documentation in the medical record.  I, Cleda Mccreedy, am acting as a Education administrator for Sullivan Lone, MD. .I have reviewed the above documentation for accuracy and completeness, and I agree with the above. Brunetta Genera MD

## 2022-03-05 ENCOUNTER — Telehealth: Payer: Self-pay | Admitting: Hematology

## 2022-03-05 ENCOUNTER — Ambulatory Visit: Payer: Commercial Managed Care - PPO

## 2022-03-05 ENCOUNTER — Other Ambulatory Visit: Payer: Commercial Managed Care - PPO

## 2022-03-05 NOTE — Telephone Encounter (Signed)
Called patient per 2/13 los notes to schedule f/u. Patient scheduled and notified. Forwarded patients concerns to RN.

## 2022-03-06 ENCOUNTER — Ambulatory Visit: Payer: 59

## 2022-03-06 ENCOUNTER — Other Ambulatory Visit: Payer: 59

## 2022-03-10 ENCOUNTER — Encounter: Payer: Self-pay | Admitting: Hematology

## 2022-03-14 ENCOUNTER — Other Ambulatory Visit (HOSPITAL_COMMUNITY): Payer: Self-pay

## 2022-03-17 ENCOUNTER — Other Ambulatory Visit (HOSPITAL_COMMUNITY): Payer: Self-pay

## 2022-03-17 DIAGNOSIS — Z92850 Personal history of chimeric antigen receptor t-cell therapy: Secondary | ICD-10-CM | POA: Diagnosis not present

## 2022-03-17 DIAGNOSIS — C9000 Multiple myeloma not having achieved remission: Secondary | ICD-10-CM | POA: Diagnosis not present

## 2022-03-17 DIAGNOSIS — M25561 Pain in right knee: Secondary | ICD-10-CM | POA: Diagnosis not present

## 2022-03-17 DIAGNOSIS — M25462 Effusion, left knee: Secondary | ICD-10-CM | POA: Diagnosis not present

## 2022-03-17 DIAGNOSIS — M25562 Pain in left knee: Secondary | ICD-10-CM | POA: Diagnosis not present

## 2022-03-17 DIAGNOSIS — M25461 Effusion, right knee: Secondary | ICD-10-CM | POA: Diagnosis not present

## 2022-03-17 MED ORDER — SULFAMETHOXAZOLE-TRIMETHOPRIM 400-80 MG PO TABS
ORAL_TABLET | ORAL | 5 refills | Status: DC
  Filled 2022-03-17: qty 90, 90d supply, fill #0

## 2022-03-18 ENCOUNTER — Other Ambulatory Visit (HOSPITAL_COMMUNITY): Payer: Self-pay

## 2022-03-18 MED ORDER — FLUCONAZOLE 200 MG PO TABS
400.0000 mg | ORAL_TABLET | Freq: Every day | ORAL | 0 refills | Status: DC
  Filled 2022-03-18: qty 30, 15d supply, fill #0

## 2022-03-24 ENCOUNTER — Encounter: Payer: Self-pay | Admitting: Hematology

## 2022-03-25 ENCOUNTER — Other Ambulatory Visit (HOSPITAL_COMMUNITY): Payer: Self-pay

## 2022-03-25 MED ORDER — MELOXICAM 15 MG PO TABS
15.0000 mg | ORAL_TABLET | Freq: Every day | ORAL | 11 refills | Status: DC
  Filled 2022-03-25: qty 30, 30d supply, fill #0
  Filled 2022-04-23: qty 30, 30d supply, fill #1
  Filled 2022-07-04 (×2): qty 30, 30d supply, fill #2
  Filled 2022-08-25: qty 30, 30d supply, fill #3

## 2022-03-27 ENCOUNTER — Ambulatory Visit
Admission: RE | Admit: 2022-03-27 | Discharge: 2022-03-27 | Disposition: A | Discharge status: Home / Self Care | Payer: Commercial Managed Care - PPO | Source: Ambulatory Visit | Attending: Radiation Oncology | Admitting: Radiation Oncology

## 2022-03-27 DIAGNOSIS — C729 Malignant neoplasm of central nervous system, unspecified: Secondary | ICD-10-CM | POA: Diagnosis not present

## 2022-03-27 DIAGNOSIS — C9030 Solitary plasmacytoma not having achieved remission: Secondary | ICD-10-CM

## 2022-03-27 MED ORDER — GADOPICLENOL 0.5 MMOL/ML IV SOLN
8.0000 mL | Freq: Once | INTRAVENOUS | Status: AC | PRN
  Administered 2022-03-27: 8 mL via INTRAVENOUS

## 2022-03-28 ENCOUNTER — Other Ambulatory Visit: Payer: Self-pay

## 2022-03-28 DIAGNOSIS — C9000 Multiple myeloma not having achieved remission: Secondary | ICD-10-CM

## 2022-03-31 ENCOUNTER — Other Ambulatory Visit: Payer: Self-pay

## 2022-03-31 ENCOUNTER — Inpatient Hospital Stay: Payer: Commercial Managed Care - PPO

## 2022-03-31 ENCOUNTER — Inpatient Hospital Stay: Payer: Commercial Managed Care - PPO | Attending: Hematology

## 2022-03-31 ENCOUNTER — Inpatient Hospital Stay (HOSPITAL_BASED_OUTPATIENT_CLINIC_OR_DEPARTMENT_OTHER): Payer: Commercial Managed Care - PPO | Admitting: Hematology

## 2022-03-31 VITALS — BP 140/77 | HR 64 | Temp 97.5°F | Resp 16 | Wt 190.0 lb

## 2022-03-31 DIAGNOSIS — C9000 Multiple myeloma not having achieved remission: Secondary | ICD-10-CM | POA: Diagnosis not present

## 2022-03-31 DIAGNOSIS — C9001 Multiple myeloma in remission: Secondary | ICD-10-CM | POA: Diagnosis not present

## 2022-03-31 DIAGNOSIS — R7303 Prediabetes: Secondary | ICD-10-CM | POA: Diagnosis not present

## 2022-03-31 DIAGNOSIS — F419 Anxiety disorder, unspecified: Secondary | ICD-10-CM | POA: Insufficient documentation

## 2022-03-31 DIAGNOSIS — I10 Essential (primary) hypertension: Secondary | ICD-10-CM | POA: Diagnosis not present

## 2022-03-31 DIAGNOSIS — C7951 Secondary malignant neoplasm of bone: Secondary | ICD-10-CM

## 2022-03-31 DIAGNOSIS — Z95828 Presence of other vascular implants and grafts: Secondary | ICD-10-CM

## 2022-03-31 DIAGNOSIS — D649 Anemia, unspecified: Secondary | ICD-10-CM | POA: Diagnosis not present

## 2022-03-31 LAB — CBC WITH DIFFERENTIAL (CANCER CENTER ONLY)
Abs Immature Granulocytes: 0.01 10*3/uL (ref 0.00–0.07)
Basophils Absolute: 0 10*3/uL (ref 0.0–0.1)
Basophils Relative: 0 %
Eosinophils Absolute: 0.1 10*3/uL (ref 0.0–0.5)
Eosinophils Relative: 4 %
HCT: 28.5 % — ABNORMAL LOW (ref 36.0–46.0)
Hemoglobin: 10 g/dL — ABNORMAL LOW (ref 12.0–15.0)
Immature Granulocytes: 0 %
Lymphocytes Relative: 30 %
Lymphs Abs: 0.7 10*3/uL (ref 0.7–4.0)
MCH: 30.3 pg (ref 26.0–34.0)
MCHC: 35.1 g/dL (ref 30.0–36.0)
MCV: 86.4 fL (ref 80.0–100.0)
Monocytes Absolute: 0.5 10*3/uL (ref 0.1–1.0)
Monocytes Relative: 20 %
Neutro Abs: 1 10*3/uL — ABNORMAL LOW (ref 1.7–7.7)
Neutrophils Relative %: 46 %
Platelet Count: 153 10*3/uL (ref 150–400)
RBC: 3.3 MIL/uL — ABNORMAL LOW (ref 3.87–5.11)
RDW: 16.6 % — ABNORMAL HIGH (ref 11.5–15.5)
WBC Count: 2.3 10*3/uL — ABNORMAL LOW (ref 4.0–10.5)
nRBC: 0 % (ref 0.0–0.2)

## 2022-03-31 LAB — CMP (CANCER CENTER ONLY)
ALT: 15 U/L (ref 0–44)
AST: 20 U/L (ref 15–41)
Albumin: 4.1 g/dL (ref 3.5–5.0)
Alkaline Phosphatase: 32 U/L — ABNORMAL LOW (ref 38–126)
Anion gap: 6 (ref 5–15)
BUN: 19 mg/dL (ref 6–20)
CO2: 27 mmol/L (ref 22–32)
Calcium: 9.1 mg/dL (ref 8.9–10.3)
Chloride: 107 mmol/L (ref 98–111)
Creatinine: 0.95 mg/dL (ref 0.44–1.00)
GFR, Estimated: >60 mL/min (ref >60)
Glucose, Bld: 92 mg/dL (ref 70–99)
Potassium: 4.5 mmol/L (ref 3.5–5.1)
Sodium: 140 mmol/L (ref 135–145)
Total Bilirubin: 0.4 mg/dL (ref 0.3–1.2)
Total Protein: 5.6 g/dL — ABNORMAL LOW (ref 6.5–8.1)

## 2022-03-31 LAB — MAGNESIUM: Magnesium: 1.7 mg/dL (ref 1.7–2.4)

## 2022-03-31 LAB — LACTATE DEHYDROGENASE: LDH: 149 U/L (ref 98–192)

## 2022-03-31 MED ORDER — HEPARIN SOD (PORK) LOCK FLUSH 100 UNIT/ML IV SOLN: 500.0000 [IU] | Freq: Once | INTRAVENOUS | Status: AC

## 2022-03-31 MED ORDER — SODIUM CHLORIDE 0.9% FLUSH: 10.0000 mL | Freq: Once | INTRAVENOUS | Status: AC

## 2022-03-31 NOTE — Progress Notes (Signed)
HEMATOLOGY/ONCOLOGY CLNIC NOTE  Date of Service: 03/31/22   Patient Care Team: [name], FNP as PCP - General (General Practice)  CHIEF COMPLAINTS/PURPOSE OF CONSULTATION:  Follow-up for continued evaluation and management of multiple myeloma.  HISTORY OF PRESENTING ILLNESS:   Please see previous note for details  INTERVAL HISTORY: [name] is here for continued evaluation and management of her multiple myeloma.    Patient was last seen by me on 03/04/2022 and she complained of mild bilateral knee joint pain and mild bilateral hand neuropathy.   Patient reports she has not been doing well since our last visit. She complains of bilateral knee pain, joint pain, left elbow pain, and bilateral shoulder pain. She notes that her right shoulder pain is more than left shoulder. Patient notes she has difficulty raising her hand due to shoulder pain. Patient also complains of weight gain and bilateral ankle swelling. She notes that her ankle swelling started after she discontinued Meloxicam 15 mg around 1 week ago.   She weighed 179 lbs during the last visit and today she weighs 190 lbs.   Patient reports body stiffness when she stands up and she reports mild shortness of breath when she walks. She notes that her fingers are stiff in the morning.  Patient reports her last PET scan was in September prior to her CAR-T therapy.  She is still taking Levaquin. She has discontinued stool softener and Ozempic. Patient notes she wants to start back Ozempic due to weight gain.   She denies fever, chills, nausea, vomiting, abnormal bowel movement, skin rashes, unexpected weight loss, chest pain, abdominal pain, or leg swelling.   ONCOLOGIC HISTORY   Lambda Light Chain Multiple Myeloma  A. 09/27/19: CT Maxillofacial enhancing dural-based anterior cranial fossa mass traversing the calvarium and extending into the extracalvarial soft tissues along with a 7.4 mm rightward midline shift at the  level of anterior cranial fossa. MRI head with and without contrast is recommended for further evaluation. B. 10/05/19: MRI Head large extra-axial mass anterior left frontal region with bone erosion and extension into the scalp soft tissues. Considerations include hemangiopericytoma, metastasis, and aggressive hemangioma. 2.6 mm enhancing lesion in the clivus.  C. 10/10/19: Left craniectomy with frontal brain tumor/bone resection, path + plasma cell neoplasm, lambda restricted, plasmacytoma favored. Hgb 9.9,  D. 10/31/19: Initial Hem-Onc consultation; M-spike 0; IgG 627, IgQ 51, IgM 11; SFLC: Kappa 1.12 mg/dL, Lambda 154.50, ratio 0.01; IFE: serum + monoclonal free Lambda light chain; Hgb 10.1,  E. 11/04/19: 24 hour UPEP Total protein 1318, Free lambda light chains Ur 1332.46, ratio 0.02, M-spike 27.4, M-spike 24 hr at 361; Bence Jones positive, Lambda type F. 11/08/19: BMBx 22% atypical plasma cells, 30% by IHC; FISH Dup(1q) and Del(13q) detected G. 11/10/19: PET CT no metabolic activity within the skeleton to localize active myeloma; no lytic lesions identified; no abnormal activity at the left craniectomy site; no evidence of soft tissue plasmacytoma. H. 12/05/19: Started KRD; SFLC: Kappa 1.42, Lambda 177.97, ratio 0.01; Hgb 11.0 I. 12//13/21: SFLC: Kappa 1.45, Lambda 8.92, ratio 0.16; Hgb 11.1 J. 02/06/20: SFLC: Kappa 1.10, Lambda 2.08, ratio 0.53; Hgb 11.3 K. 02/26/20: Cycle 4 of KRD; SFLC: Kappa 1.14, Lambda 1.90, ratio 0.60; Hgb 10.8 L. 04/02/20: SFLC: Kappa 1.05, Lambda 1.56, ratio 0.67; Hgb 11.2 M. 04/30/20: SFLC: Kappa 1.01, Lambda 1.40, ratio 0.72; Hgb 11.5  N. 05/05/20: MRI Brain no evidence of left frontal bone lesion recurrence; Multiple subcentimeter area of nodular enhancement in the calvarium are  nonspecific but could related to myeloma given nonvisualization on prior. O. 05/11/20: PET CT no findings to suggest FDG avid lesions of multiple myeloma. P. 05/14/20: BMBx 1% plasma cells Q. 05/28/20:  SFLC: Kappa 0.84, Lambda 1.16, ratio 0.72; Hgb 11.6  R. 06/11/20: EKG QT 408/QTc 424; PFT's: FEV1 110%, DLCO 58.8% predicted, 64.4% hgb corrected; ECHO: EF 55% est, 61% calc; GLS -20%; SFLC: Kappa 1.16, Lambda 1.40, ratio 0.83; Hgb 10.9-VGPR S. 07/06/20: SFLC: Kappa 1.16, Lambda 2.14, ratio 0.54; . Hgb 11.7, Plt 118 T. 6/20-7/12/22: Hospital admission for Melphalan 200 mg/m2 followed by reinfusion of 4.71 x 10^6/kg CD34+ autologous cells  MEDICAL HISTORY:  Past Medical History:  Diagnosis Date   Anxiety    Hypertension    Pre-diabetes     SURGICAL HISTORY: Past Surgical History:  Procedure Laterality Date   ABLATION     APPLICATION OF CRANIAL NAVIGATION N/A 10/10/2019   Procedure: APPLICATION OF CRANIAL NAVIGATION;  Surgeon: Consuella Lose, MD;  Location: [place];  Service: Neurosurgery;  Laterality: N/A;  APPLICATION OF CRANIAL NAVIGATION   CRANIOPLASTY Left 10/14/2019   Procedure: LEFT CRANIOPLASTY WITH PLACEMENT OF CUSTOM CRANIAL IMPLANT;  Surgeon: Consuella Lose, MD;  Location: [place];  Service: Neurosurgery;  Laterality: Left;   CRANIOTOMY Left 10/10/2019   Procedure: Left frontal Stereotactic craniectomy for resection of tumor;  Surgeon: Consuella Lose, MD;  Location: [place];  Service: Neurosurgery;  Laterality: Left;  Left frontal Stereotactic craniectomy for resection of tumor   HAMMER TOE SURGERY Left    IR IMAGING GUIDED PORT INSERTION  12/01/2019   IR IMAGING GUIDED PORT INSERTION  08/06/2021   TUBAL LIGATION      SOCIAL HISTORY: Social History   Socioeconomic History   Marital status: Single    Spouse name: Not on file   Number of children: Not on file   Years of education: Not on file   Highest education level: 12th grade  Occupational History   Not on file  Tobacco Use   Smoking status: Never   Smokeless tobacco: Never  Vaping Use   Vaping Use: Never used  Substance and Sexual Activity   Alcohol use: Yes    Comment: occassionally   Drug use: Not  Currently   Sexual activity: Not on file  Other Topics Concern   Not on file  Social History Narrative   Not on file   Social Determinants of Health   Financial Resource Strain: Medium Risk (08/13/2020)   Overall Financial Resource Strain (CARDIA)    Difficulty of Paying Living Expenses: Somewhat hard  Food Insecurity: Food Insecurity Present (08/13/2020)   Hunger Vital Sign    Worried About Running Out of Food in the Last Year: Sometimes true    Ran Out of Food in the Last Year: Never true  Transportation Needs: Unmet Transportation Needs (08/13/2020)   PRAPARE - Transportation    Lack of Transportation (Medical): No    Lack of Transportation (Non-Medical): Yes  Physical Activity: Sufficiently Active (08/13/2020)   Exercise Vital Sign    Days of Exercise per Week: 5 days    Minutes of Exercise per Session: 30 min  Stress: No Stress Concern Present (08/13/2020)   [employer] [place]    Feeling of Stress : Only a little  Social Connections: Moderately Integrated (08/13/2020)   Social Connection and Isolation Panel [NHANES]    Frequency of Communication with Friends and Family: More than three times a week    Frequency of  Social Gatherings with Friends and Family: Once a week    Attends Religious Services: More than 4 times per year    Active Member of [employer] or Organizations: Yes    Attends Archivist Meetings: 1 to 4 times per year    Marital Status: Divorced  Human resources officer Violence: Not on file    FAMILY HISTORY: Family History  Problem Relation Age of Onset   Breast cancer Maternal Grandmother    Heart disease Mother    Hypertension Mother    Sarcoidosis Father    Diabetes Maternal Grandfather     ALLERGIES:  has No Known Allergies.  MEDICATIONS:  Current Outpatient Medications  Medication Sig Dispense Refill   atorvastatin (LIPITOR) 10 MG tablet Take 1 tablet (10 mg total) by mouth daily. (Patient  not taking: Reported on 03/04/2022) 90 tablet 1   bimatoprost (LATISSE) 0.03 % ophthalmic solution Place one drop on applicator and apply evenly at the base of eyelashes nightly. Repeat procedure for the other eye.     docusate sodium (COLACE) 100 MG capsule Take 100 mg by mouth 2 (two) times daily. (Patient not taking: Reported on 03/04/2022)     fluconazole (DIFLUCAN) 200 MG tablet Take 2 tablets (400 mg total) by mouth daily. 30 tablet 0   hydrochlorothiazide (HYDRODIURIL) 12.5 MG tablet Take 1 tablet by mouth daily as needed for leg swelling (Patient not taking: Reported on 10/30/2021) 90 tablet 1   levETIRAcetam (KEPPRA) 500 MG tablet Take 1 tablet (500 mg total) by mouth every 12 (twelve) hours. Start on day prior to CAR-T cell infusion and continue for 60 days after CAR-T cell infusion. 60 tablet 0   levocetirizine (XYZAL) 5 MG tablet TAKE 1 TABLET BY MOUTH EVERY DAY IN THE EVENING 90 tablet 3   meloxicam (MOBIC) 15 MG tablet Take 1 tablet (15 mg total) by mouth daily. 30 tablet 11   OZEMPIC, 1 MG/DOSE, 4 MG/3ML SOPN INJECT 1 MG UNDER THE SKIN ONCE WEEKLY, ON THE SAME DAY. (Patient not taking: Reported on 03/04/2022) 9 mL 1   pomalidomide (POMALYST) 2 MG capsule TAKE 1 CAPSULE BY MOUTH ONCE DAILY FOR 21 DAYS ON AND 7 DAYS OFF (Patient not taking: Reported on 03/04/2022) 21 capsule 0   POMALYST 2 MG capsule TAKE 1 CAPSULE BY MOUTH ONCE DAILY FOR 21 DAYS ON AND 7 DAYS OFF (Patient not taking: Reported on 03/04/2022) 21 capsule 0   sulfamethoxazole-trimethoprim (BACTRIM) 400-80 MG tablet Take 1 tablet by mouth once.     sulfamethoxazole-trimethoprim (BACTRIM) 400-80 MG tablet Take 1 tablet by mouth once daily. Continue until 6 months post CAR-T therapy. 30 tablet 5   traZODone (DESYREL) 50 MG tablet Take 1 - 2 tablets (50 - 100 mg total) by mouth at bedtime as needed for sleep 180 tablet 1   valACYclovir (VALTREX) 500 MG tablet Take 1 tablet by mouth 2 times a day 60 tablet 10   No current  facility-administered medications for this visit.    REVIEW OF SYSTEMS:   10 Point review of Systems was done is negative except as noted above.  PHYSICAL EXAMINATION: ECOG PERFORMANCE STATUS: 1 - Symptomatic but completely ambulatory .BP (!) 140/77 (BP Location: Left Arm, Patient Position: Sitting)   Pulse 64   Temp (!) 97.5 F (36.4 C) (Oral)   Resp 16   Wt 190 lb (86.2 kg)   LMP 07/09/2015   SpO2 100%   BMI 30.67 kg/m  NAD GENERAL:alert, in no acute distress and comfortable  SKIN: no acute rashes, no significant lesions EYES: conjunctiva are pink and non-injected, sclera anicteric OROPHARYNX: MMM, no exudates, no oropharyngeal erythema or ulceration NECK: supple, no JVD LYMPH:  no palpable lymphadenopathy in the cervical, axillary or inguinal regions LUNGS: clear to auscultation b/l with normal respiratory effort HEART: regular rate & rhythm ABDOMEN:  normoactive bowel sounds , non tender, not distended. Extremity: no pedal edema PSYCH: alert & oriented x 3 with fluent speech NEURO: no focal motor/sensory deficits   LABORATORY DATA:  I have reviewed the data as listed     Latest Ref Rng & Units 03/31/2022   12:56 PM 03/04/2022    8:34 AM 02/20/2022    4:05 PM  CBC  WBC 4.0 - 10.5 K/uL 2.3  2.2  2.5   Hemoglobin 12.0 - 15.0 g/dL 10.0  11.2  11.0   Hematocrit 36.0 - 46.0 % 28.5  32.6  31.9   Platelets 150 - 400 K/uL 153  158  134   ANC 500     Latest Ref Rng & Units 03/31/2022   12:56 PM 03/04/2022    8:34 AM 02/20/2022    4:05 PM  CMP  Glucose 70 - 99 mg/dL 92  89  87   BUN 6 - 20 mg/dL 19  16  13    Creatinine 0.44 - 1.00 mg/dL 0.95  0.87  0.95   Sodium 135 - 145 mmol/L 140  140  141   Potassium 3.5 - 5.1 mmol/L 4.5  4.1  4.5   Chloride 98 - 111 mmol/L 107  106  106   CO2 22 - 32 mmol/L 27  28  28    Calcium 8.9 - 10.3 mg/dL 9.1  9.0  9.4   Total Protein 6.5 - 8.1 g/dL 5.6  5.6  6.0   Total Bilirubin 0.3 - 1.2 mg/dL 0.4  0.5  0.5   Alkaline Phos 38 - 126 U/L 32   40  41   AST 15 - 41 U/L 20  18  24    ALT 0 - 44 U/L 15  15  30      11/08/2019 FISH Plasma Cell Myeloma Prognostic Panel:    11/08/2019 Cytogenetics:    11/08/2019 Bone Marrow Report [phone]):   10/10/2019 Surgical Pathology [phone]):   05/14/2020 Surgical Pathology "-Normocellular bone marrow for age with trilineage hematopoiesis and 1%  plasma cells  -See comment   PERIPHERAL BLOOD:  -Normocytic-normochromic anemia  -Leukopenia   COMMENT:   The bone marrow is generally normocellular for age with trilineage  hematopoiesis but with predominance of erythroid precursors.  The  myeloid changes are not considered specific and likely related to  therapy.  In this background, the plasma cells represent 1% of all cells  associated with scattered small clusters and display slight lambda light  chain excess.  The findings are limited but most suggestive of minimal  residual plasma cell neoplasm.  Correlation with cytogenetic and FISH  studies is recommended. "  RADIOGRAPHIC STUDIES:  I have personally reviewed the radiological images as listed and agreed with the findings in the report. MR Brain W Wo Contrast  Result Date: 03/28/2022 CLINICAL DATA:  Brain metastases, assess treatment response 3T SRS Protocol EXAM: MRI HEAD WITHOUT AND WITH CONTRAST TECHNIQUE: Multiplanar, multiecho pulse sequences of the brain and surrounding structures were obtained without and with intravenous contrast. CONTRAST:  8 mL of Vueway COMPARISON:  MRI head 12/26/2021. FINDINGS: Brain: Similar postsurgical change bifrontal craniectomy and cranioplasty with unchanged linear dural  enhancement and thickening along the resection cavity. No new masslike enhancement to suggest recurrent/residual malignancy. Similar encephalomalacia/gliosis. No evidence of acute infarct, acute hemorrhage, mass lesion, midline shift or hydrocephalus. Scattered T2/FLAIR hyperintensities in the white matter, likely the  sequela of chronic microvascular ischemic disease. Vascular: Major arterial flow voids are maintained at the skull base. Skull and upper cervical spine: Similar subcentimeter foci of enhancement within the Co barium. Sinuses/Orbits: Clear sinuses.  No acute orbital findings. Other: No mastoid effusions. IMPRESSION: 1. Similar postsurgical changes without new masslike enhancement to suggest residual/recurrent malignancy. 2. Similar calvarial enhancement. Electronically Signed   By: Margaretha Sheffield M.D.   On: 03/28/2022 08:34     Bone marrow biopsy on 10/14/2021:  DIAGNOSIS    A-C. Bone marrow, aspirate smear, touch prep, clot section, core biopsy and peripheral blood smear: - Plasma cell neoplasm, based on 3% atypical plasma cells on aspirate smear and MRD flow cytometry results with 0.2% abnormal lambda-monotypic plasma cells - Aspirate smear with erythroid predominance, no increase in blasts - Core biopsy and clot section limited for evaluation; touch prep unsatisfactory for evaluation - Congo Red stain negative for amyloid deposition - See comment  Electronically signed by Brandon Melnick, MD on 10/17/2021 at  6:35 PM  Diagnostic Comment    Corresponding flow cytometry analysis, [id number], shows a plasma cell population with aberrant immunophenotype comprising 0.2% of analyzed events, supporting the diagnosis above.     PET scan on 10/14/2021: IMPRESSION:   1.  No definite hypermetabolic disease.   2.  Indeterminate focus of hypermetabolic activity in the right proximal  arm musculature which is favored posttraumatic or related to spasm. This is  without CT correlate, but assessment is limited given lack of IV contrast.  Recommend attention on follow-up given the focal nature of this activity.    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 No M spike. Lambda light chain myeloma + Anemia + bone lesion on skull-- resected No renal failure, hypercalcemia  BMBx- 22-30% lambda restricted plasma cell MolCy Dup (1q), del (13q)  Completed 7 cycles of KRD  The pt recently had a transplant at [facility]. DISEASE Lambda Light Chain Multiple Myeloma  - She will receive Melphalan 200 mg/m2 conditioning regimen, followed by autologous stem cell rescue (see details below).  TRANSPLANT: Preparative regimen:  - Melphalan 200 mg/m2 (355mg ) IV: 07/09/20  Stem Cells:  - Autologous stem cell reinfusion on 07/10/20. Patient received 4.71 x 10^6 CD34+/kg. - Her course was complicated by neutropenic fevers and Bcx 6/27 positive for E coli. She received IV cefepime from 6/27 until 6/30, which cefepime was switched to zosyn in the setting of new abdominal pain. CT abdomen/pelvis was performed that showed acute uncomplicated multifocal diverticulitis involving the transverse and descending colon. She was treated with bowel rest, prn pain medication, and IV zosyn, which was continued until engraftment on 7/5 and was transitioned to ppx ceftriaxone. Unfortunately, with the transition to ceftriaxone she had recurrent fevers and as a result was restarted on zosyn. Zosyn was transitioned to oral cipro/flagyl on 7/10 with the plan to continue for a 7 day course.    Bone marrow biopsy done on 01/18/2021 showed normocellular bone marrow with no morphologic evidence of plasma cell neoplasm. 24-hour  UPEP on 01/22/2021 showed unremarkable immunofixation pattern and no monoclonal protein. PET CT scan 01/16/2021-negative for FDG avid lesions of myeloma MRI brain 01/12/2021-showed no overt active myeloma lesions.  S/p CAR-T therapy - She received Flu/Cy lymph-depletion, followed by CAR-T ([place]) infusion.  TRANSPLANT: Preparative Regimen:  -  Fludarabine (30 mg/m2) 60 mg IV on 12/2121 - 1223/23 (Day -5 thru Day -3). - Cytoxan (300 mg/m2) 600 mg IV on 01/09/22 - 01/11/22 (Day -5 thru Day -3).  Stem Cells:  - CAR-T cell reinfusion on 01/14/22    3) prediabetes 4) hypertension 5) anxiety 6) dyslipidemia 7) s/p ABnormal LFts -- azole toxicity vs CRS  PLAN: -Discussed lab results from today, 03/31/2022, with the patient. CBC shows decreased WBC of 2.3 which have increased since last visit, ANC 1000, decreased hemoglobin of 10.0, and hematocrit of 28.5.  CMP is stable -recommended to continue follow-up with transplant team.  -Discussed the option of shoulder MRI or wait until she is off Levaquin. Patient wants to wait until Levaquin. -Start taking hydrochlorothiazide due to elevated blood pressure.  -Answered all of patient's questions.   FOLLOW-UP: RTC with Dr Irene Limbo with labs in 2 months  The total time spent in the appointment was 20 minutes* .  All of the patient's questions were answered with apparent satisfaction. The patient knows to call the clinic with any problems, questions or concerns.   Sullivan Lone MD MS AAHIVMS [facility] Hematology/Oncology Physician [facility]  .*Total Encounter Time as defined by the Centers for Medicare and Medicaid Services includes, in addition to the face-to-face time of a patient visit (documented in the note above) non-face-to-face time: obtaining and reviewing outside history, ordering and reviewing medications, tests or procedures, care coordination (communications with other health care professionals or caregivers) and documentation in the medical record.   I, Cleda Mccreedy, am acting as a Education administrator for Sullivan Lone, MD. .I have reviewed the above documentation for accuracy and completeness, and I agree with the above. Brunetta Genera MD

## 2022-04-01 ENCOUNTER — Telehealth: Payer: Self-pay | Admitting: Hematology

## 2022-04-01 ENCOUNTER — Encounter: Payer: Self-pay | Admitting: Urology

## 2022-04-01 NOTE — Telephone Encounter (Signed)
Called patient per 3/11 los notes to schedule f/u. Patient scheduled and notified.

## 2022-04-01 NOTE — Progress Notes (Signed)
Telephone nursing appointment for patient to receive most recent scan results from 03/27/22. I verified patient's identity and began nursing interview. Patient reports bilateral shoulder/ knee pain 10/10. No other related issues conveyed at this time.   Meaningful use complete.   Patient aware of their 10:00am-04/02/22 telephone appointment w/ Ashlyn Bruning PA-C. I left my extension [phone] in case patient needs anything. Patient verbalized understanding. This concludes the nursing interview.   Patient contact [phone]     Leandra Kern, LPN

## 2022-04-02 ENCOUNTER — Ambulatory Visit
Admission: RE | Admit: 2022-04-02 | Discharge: 2022-04-02 | Disposition: A | Discharge status: Home / Self Care | Payer: Commercial Managed Care - PPO | Source: Ambulatory Visit | Attending: Urology | Admitting: Urology

## 2022-04-02 DIAGNOSIS — C7951 Secondary malignant neoplasm of bone: Secondary | ICD-10-CM

## 2022-04-02 DIAGNOSIS — C9002 Multiple myeloma in relapse: Secondary | ICD-10-CM | POA: Diagnosis not present

## 2022-04-02 NOTE — Progress Notes (Signed)
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   Interval Since Last Radiation:  10 months  06/03/21 - 06/14/21:   The recurrent tumor nodule in the left frontotemporal skull was treated to 30 Gy in 10 fractions and the cranioplasty surgical bed was simultaneously treated to 20 Gy in 10 fractions.   Narrative:  I contacted the patient to conduct her routine scheduled 3 month follow up visit to review the results of her recent MRI brain scan via telephone to spare the patient unnecessary potential exposure in the healthcare setting during the current COVID-19 pandemic.  The patient was notified in advance and gave permission to proceed with this visit format.   She tolerated radiation treatment relatively well without any acute ill side effects.                               On review of systems, she is doing very well in general and without complaints.  She tolerated the treatment very well with some mild alopecia as expected but otherwise denies any ill side effects associated with treatment.  She had been having some paresthesias and pain in the left foot but that resolved spontaneously.  She denies changes in visual or auditory acuity, difficulty with speech, imbalance, tremors or seizure activity.  She has continued in observation, under the care of Dr. Irene Limbo, s/p a successful stem cell transplant at [facility] in 12/2021. Aside from some joint aches and pains, she has tolerated the transplant well and continues in follow up with the transplant team at [facility] as well. She is still grieving the loss of her daughter this past summer.  She continues to take it 1 day at a time. She denies any paresthesias or focal weakness.  She had a  recent posttreatment brain MRI on 03/27/22 which shows an excellent response to treatment, with similar appearance of the postsurgical changes without new masslike enhancement to suggest residual/recurrent malignancy.and a stable appearance of subcentimeter foci of enhancement within the calvarium.  We reviewed these results today.  ALLERGIES:  has No Known Allergies.  Meds: Current Outpatient Medications  Medication Sig Dispense Refill   atorvastatin (LIPITOR) 10 MG tablet Take 1 tablet (10 mg total) by mouth daily. (Patient not taking: Reported on 03/04/2022) 90 tablet 1   bimatoprost (LATISSE) 0.03 % ophthalmic solution Place one drop on applicator and apply evenly at the base of eyelashes nightly. Repeat procedure for the other eye.     docusate sodium (COLACE) 100 MG capsule Take 100 mg by mouth 2 (two) times daily. (Patient not taking: Reported on 03/04/2022)     fluconazole (DIFLUCAN) 200 MG tablet Take 2 tablets (400 mg total) by mouth daily. (Patient not taking: Reported on 04/01/2022) 30 tablet 0   hydrochlorothiazide (HYDRODIURIL) 12.5 MG tablet Take 1 tablet by mouth daily as needed for leg swelling (Patient not taking: Reported on 10/30/2021) 90 tablet 1   levETIRAcetam (KEPPRA) 500 MG tablet Take 1 tablet (500 mg total) by mouth every 12 (twelve) hours. Start on day prior to CAR-T cell infusion and continue for 60 days after CAR-T cell infusion. 60 tablet 0   levocetirizine (XYZAL)  5 MG tablet TAKE 1 TABLET BY MOUTH EVERY DAY IN THE EVENING 90 tablet 3   meloxicam (MOBIC) 15 MG tablet Take 1 tablet (15 mg total) by mouth daily. 30 tablet 11   OZEMPIC, 1 MG/DOSE, 4 MG/3ML SOPN INJECT 1 MG UNDER THE SKIN ONCE WEEKLY, ON THE SAME DAY. (Patient not taking: Reported on 03/04/2022) 9 mL 1   pomalidomide (POMALYST) 2 MG capsule TAKE 1 CAPSULE BY MOUTH ONCE DAILY FOR 21 DAYS ON AND 7 DAYS OFF (Patient not taking: Reported on 03/04/2022) 21 capsule 0   POMALYST 2 MG capsule TAKE 1 CAPSULE BY MOUTH  ONCE DAILY FOR 21 DAYS ON AND 7 DAYS OFF (Patient not taking: Reported on 03/04/2022) 21 capsule 0   sulfamethoxazole-trimethoprim (BACTRIM) 400-80 MG tablet Take 1 tablet by mouth once.     sulfamethoxazole-trimethoprim (BACTRIM) 400-80 MG tablet Take 1 tablet by mouth once daily. Continue until 6 months post CAR-T therapy. 30 tablet 5   traZODone (DESYREL) 50 MG tablet Take 1 - 2 tablets (50 - 100 mg total) by mouth at bedtime as needed for sleep 180 tablet 1   valACYclovir (VALTREX) 500 MG tablet Take 1 tablet by mouth 2 times a day 60 tablet 10   No current facility-administered medications for this encounter.   Facility-Administered Medications Ordered in Other Encounters  Medication Dose Route Frequency Provider Last Rate Last Admin   heparin lock flush 100 unit/mL  500 Units Intracatheter Once Brunetta Genera, MD       sodium chloride flush (NS) 0.9 % injection 10 mL  10 mL Intracatheter Once Brunetta Genera, MD        Physical Findings:  vitals were not taken for this visit.  Pain Assessment Pain Score: 10-Worst pain ever (bilateral shoulders and knees)/10 Unable to assess due to telephone follow-up visit format.  Lab Findings: Lab Results  Component Value Date   WBC 2.3 (L) 03/31/2022   HGB 10.0 (L) 03/31/2022   HCT 28.5 (L) 03/31/2022   MCV 86.4 03/31/2022   PLT 153 03/31/2022     Radiographic Findings: MR Brain W Wo Contrast  Result Date: 03/28/2022 CLINICAL DATA:  Brain metastases, assess treatment response 3T SRS Protocol EXAM: MRI HEAD WITHOUT AND WITH CONTRAST TECHNIQUE: Multiplanar, multiecho pulse sequences of the brain and surrounding structures were obtained without and with intravenous contrast. CONTRAST:  8 mL of Vueway COMPARISON:  MRI head 12/26/2021. FINDINGS: Brain: Similar postsurgical change bifrontal craniectomy and cranioplasty with unchanged linear dural enhancement and thickening along the resection cavity. No new masslike enhancement to  suggest recurrent/residual malignancy. Similar encephalomalacia/gliosis. No evidence of acute infarct, acute hemorrhage, mass lesion, midline shift or hydrocephalus. Scattered T2/FLAIR hyperintensities in the white matter, likely the sequela of chronic microvascular ischemic disease. Vascular: Major arterial flow voids are maintained at the skull base. Skull and upper cervical spine: Similar subcentimeter foci of enhancement within the Co barium. Sinuses/Orbits: Clear sinuses.  No acute orbital findings. Other: No mastoid effusions. IMPRESSION: 1. Similar postsurgical changes without new masslike enhancement to suggest residual/recurrent malignancy. 2. Similar calvarial enhancement. Electronically Signed   By: Margaretha Sheffield M.D.   On: 03/28/2022 08:34    Impression/Plan: 1. 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   She has recovered well from the effects of her previous radiation and remains without complaints.  She has continued to tolerate her recent stem cell transplant and remains in observation, under the care of Dr. Irene Limbo.  She also continues in routine follow up with her transplant team at [facility].  Her recent posttreatment brain MRI from 12/26/2021 shows a very stable appearance and no new lesions identified.  We discussed the plan to continue to closely monitor with serial MRI brain scans every 3 months for the first year and pending these scans remain stable, we will then move to 6 month imaging.  We will follow-up by telephone following each scan to review results and recommendations from the multidisciplinary brain conference.  She appears to have a good understanding of these recommendations and is comfortable and in agreement with the stated plan.  She was advised to call at anytime with any questions or concerns in the interim.  I personally spent 20 minutes in this encounter including chart review, reviewing radiological studies, telephone  conversation with the patient, entering orders and completing documentation.    Nicholos Johns, PA-C

## 2022-04-06 ENCOUNTER — Encounter: Payer: Self-pay | Admitting: Hematology

## 2022-04-09 ENCOUNTER — Other Ambulatory Visit: Payer: Self-pay

## 2022-04-09 DIAGNOSIS — C9000 Multiple myeloma not having achieved remission: Secondary | ICD-10-CM

## 2022-04-10 ENCOUNTER — Inpatient Hospital Stay: Payer: Commercial Managed Care - PPO

## 2022-04-10 DIAGNOSIS — F419 Anxiety disorder, unspecified: Secondary | ICD-10-CM | POA: Diagnosis not present

## 2022-04-10 DIAGNOSIS — C7951 Secondary malignant neoplasm of bone: Secondary | ICD-10-CM

## 2022-04-10 DIAGNOSIS — D649 Anemia, unspecified: Secondary | ICD-10-CM | POA: Diagnosis not present

## 2022-04-10 DIAGNOSIS — I10 Essential (primary) hypertension: Secondary | ICD-10-CM | POA: Diagnosis not present

## 2022-04-10 DIAGNOSIS — C9000 Multiple myeloma not having achieved remission: Secondary | ICD-10-CM | POA: Diagnosis not present

## 2022-04-10 DIAGNOSIS — Z7189 Other specified counseling: Secondary | ICD-10-CM

## 2022-04-10 DIAGNOSIS — C9001 Multiple myeloma in remission: Secondary | ICD-10-CM | POA: Diagnosis not present

## 2022-04-10 DIAGNOSIS — R7303 Prediabetes: Secondary | ICD-10-CM | POA: Diagnosis not present

## 2022-04-10 LAB — CBC WITH DIFFERENTIAL (CANCER CENTER ONLY)
Abs Immature Granulocytes: 0.01 10*3/uL (ref 0.00–0.07)
Basophils Absolute: 0 10*3/uL (ref 0.0–0.1)
Basophils Relative: 1 %
Eosinophils Absolute: 0.1 10*3/uL (ref 0.0–0.5)
Eosinophils Relative: 3 %
HCT: 30.2 % — ABNORMAL LOW (ref 36.0–46.0)
Hemoglobin: 10.5 g/dL — ABNORMAL LOW (ref 12.0–15.0)
Immature Granulocytes: 1 %
Lymphocytes Relative: 38 %
Lymphs Abs: 0.8 10*3/uL (ref 0.7–4.0)
MCH: 29.9 pg (ref 26.0–34.0)
MCHC: 34.8 g/dL (ref 30.0–36.0)
MCV: 86 fL (ref 80.0–100.0)
Monocytes Absolute: 0.4 10*3/uL (ref 0.1–1.0)
Monocytes Relative: 19 %
Neutro Abs: 0.8 10*3/uL — ABNORMAL LOW (ref 1.7–7.7)
Neutrophils Relative %: 38 %
Platelet Count: 166 10*3/uL (ref 150–400)
RBC: 3.51 MIL/uL — ABNORMAL LOW (ref 3.87–5.11)
RDW: 16.4 % — ABNORMAL HIGH (ref 11.5–15.5)
WBC Count: 2.1 10*3/uL — ABNORMAL LOW (ref 4.0–10.5)
nRBC: 0 % (ref 0.0–0.2)

## 2022-04-14 ENCOUNTER — Other Ambulatory Visit: Payer: Self-pay | Admitting: Radiation Therapy

## 2022-04-14 DIAGNOSIS — C9031 Solitary plasmacytoma in remission: Secondary | ICD-10-CM

## 2022-04-15 ENCOUNTER — Other Ambulatory Visit: Payer: Self-pay | Admitting: Radiation Therapy

## 2022-04-15 ENCOUNTER — Other Ambulatory Visit: Payer: Self-pay | Admitting: Nurse Practitioner

## 2022-04-15 ENCOUNTER — Other Ambulatory Visit (HOSPITAL_COMMUNITY): Payer: Self-pay

## 2022-04-15 DIAGNOSIS — I10 Essential (primary) hypertension: Secondary | ICD-10-CM

## 2022-04-15 DIAGNOSIS — D801 Nonfamilial hypogammaglobulinemia: Secondary | ICD-10-CM | POA: Insufficient documentation

## 2022-04-15 DIAGNOSIS — Z1231 Encounter for screening mammogram for malignant neoplasm of breast: Secondary | ICD-10-CM

## 2022-04-16 ENCOUNTER — Other Ambulatory Visit: Payer: Self-pay | Admitting: Radiation Therapy

## 2022-04-16 ENCOUNTER — Other Ambulatory Visit (HOSPITAL_COMMUNITY): Payer: Self-pay

## 2022-04-16 ENCOUNTER — Telehealth: Payer: Self-pay | Admitting: Radiation Therapy

## 2022-04-16 IMAGING — CT NM PET TUM IMG INITIAL (PI) WHOLE BODY
7 series · 25 of 25 positions shown · non-contrast
Comparison: None.

CLINICAL DATA: Subsequent treatment strategy for plasmacytoma
versus multiple myeloma.

EXAM:
NUCLEAR MEDICINE PET WHOLE BODY
TECHNIQUE: 8.0 mCi F-18 FDG was injected intravenously. Full-ring PET imaging
was performed from the head to foot after the radiotracer. CT data
was obtained and used for attenuation correction and anatomic
localization.
Fasting blood glucose: 85 mg/dl

[Series 3: pet wb ac · axial · 5.0mm · 4.07mm/px · z∈[-396,+1424]mm · 5 of 456 slices shown]
[im 1/456]
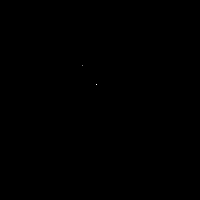
[im 114/456]
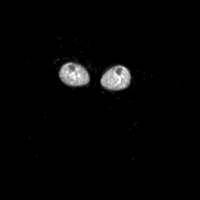
[im 228/456]
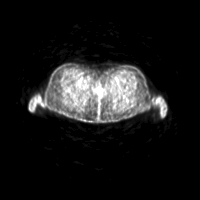
[im 342/456]
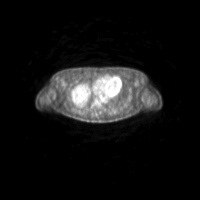
[im 456/456]
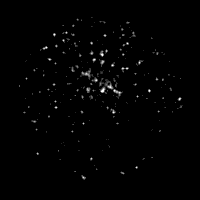

[Series 4: ct wb 5.0 hd_fov · axial · 5.0mm · 1.17mm/px · z∈[-400,+1424]mm · 6 of 457 slices shown]
[im 1/457  full-range]
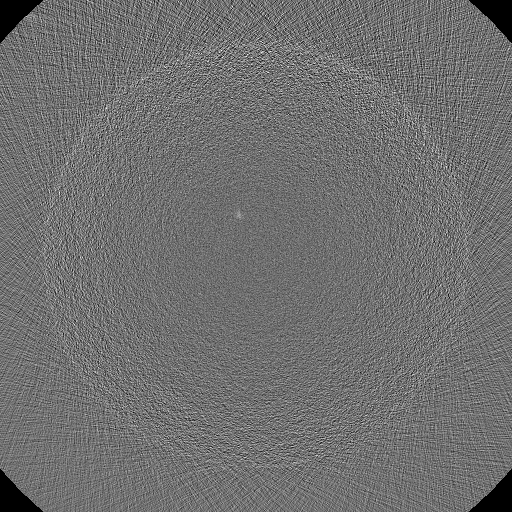
[im 92/457  soft-tissue]
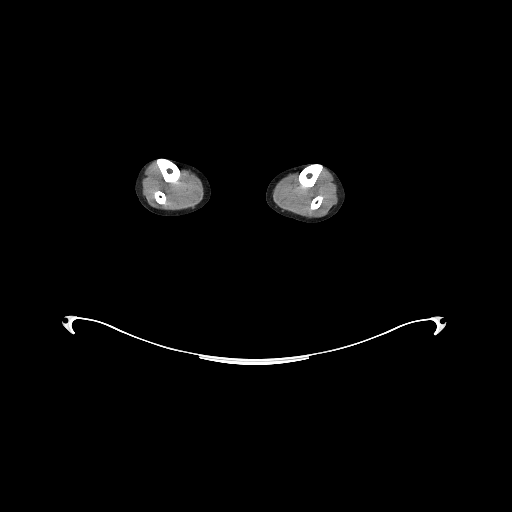
[im 183/457  soft-tissue]
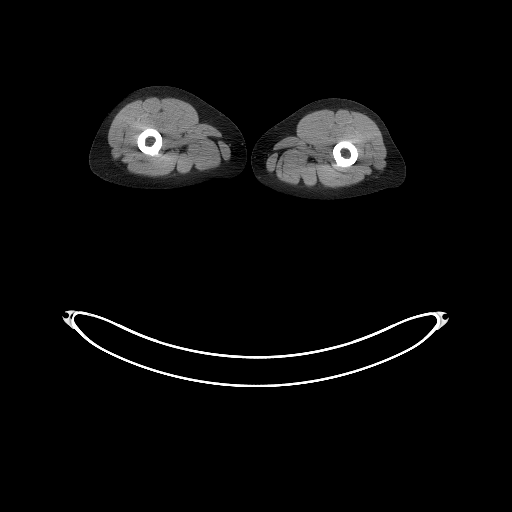
[im 274/457  soft-tissue]
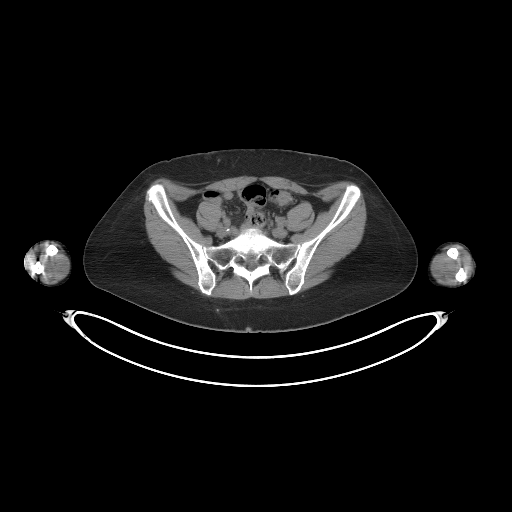
[im 365/457  soft-tissue]
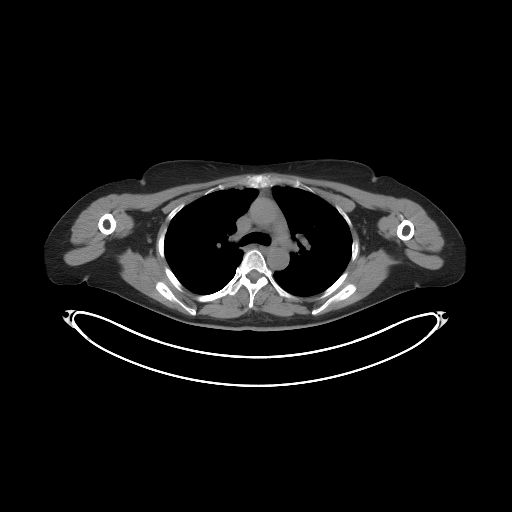
[im 457/457  soft-tissue]
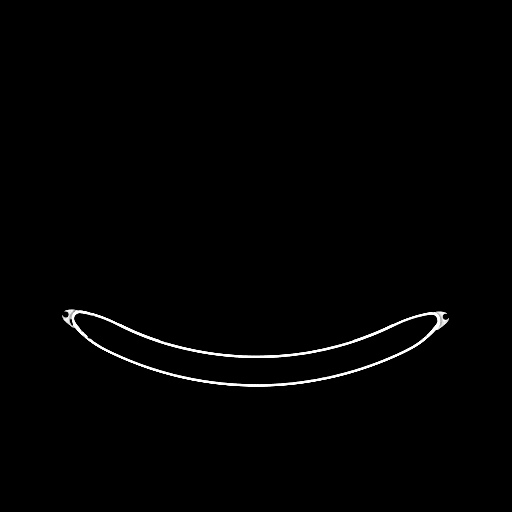

[Series 5: pet wb nac · axial · 5.0mm · 4.07mm/px · z∈[-396,+1424]mm · 6 of 456 slices shown]
[im 1/456  full-range]
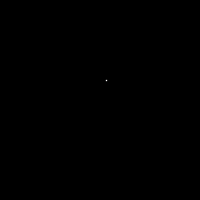
[im 92/456]
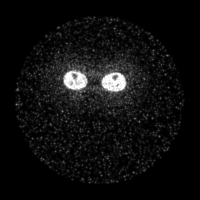
[im 183/456]
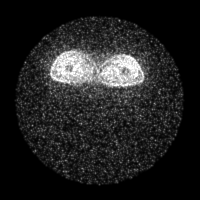
[im 274/456]
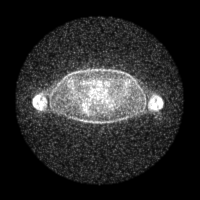
[im 365/456]
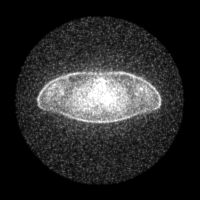
[im 456/456]
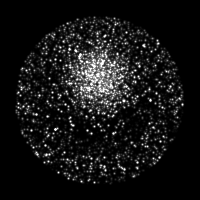

[Series 8: ct wb 5.0 br59 lung_bone · axial · 5.0mm · 0.58mm/px · 1 of 65 slices shown]
[im 1/65  lung]
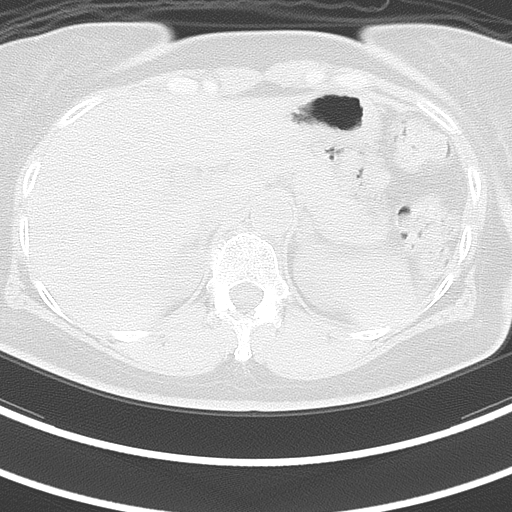

[Series 603: fused cor · 1 of 33 slices shown]
[im 1/33]
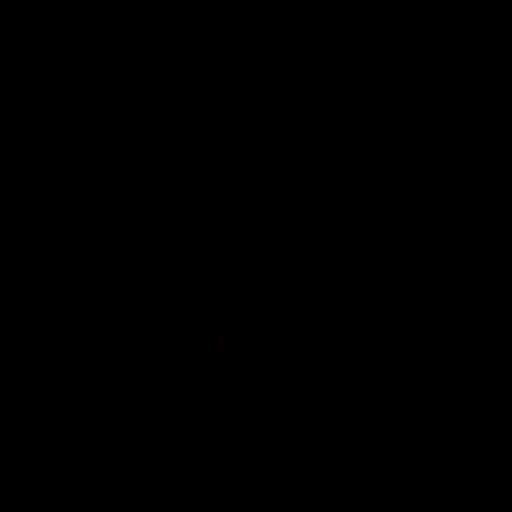

[Series 604: <mip collection> · coronal · 3.78mm/px · 1 of 32 slices shown]
[im 1/32]
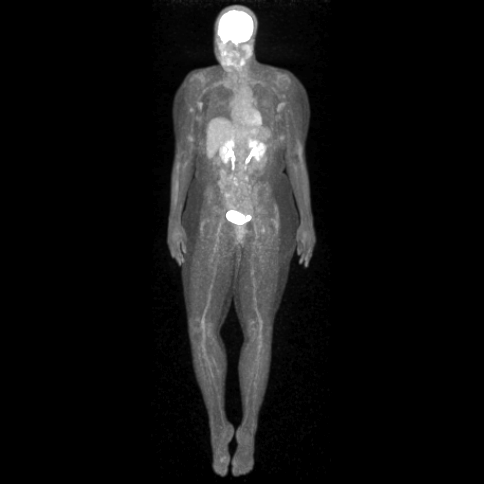

[Series 605: range-ct wb 5.0 hd_fov-tra-<alpha range> · 5 of 422 slices shown]
[im 1/422]
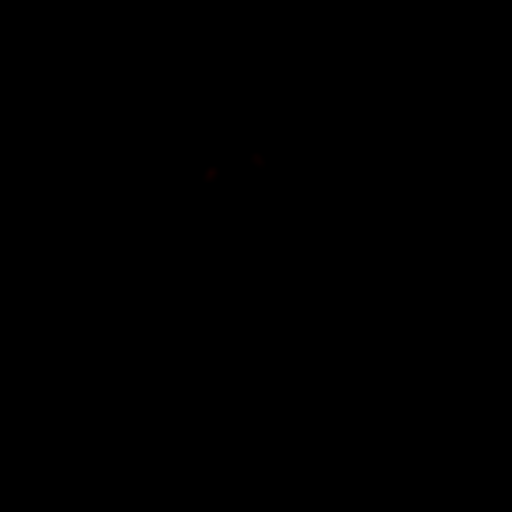
[im 106/422]
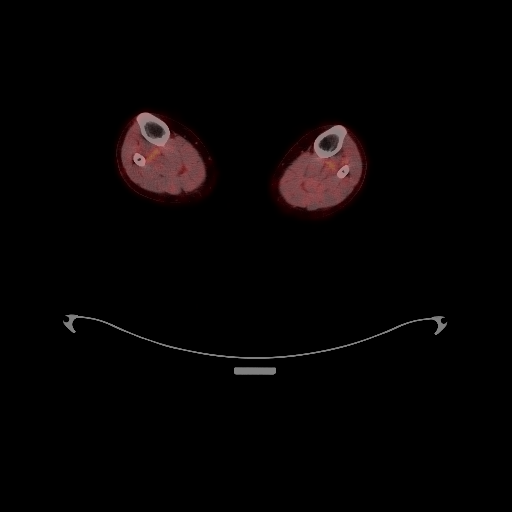
[im 211/422]
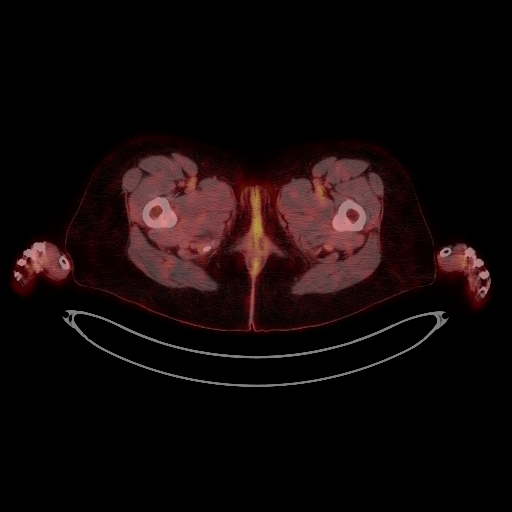
[im 316/422]
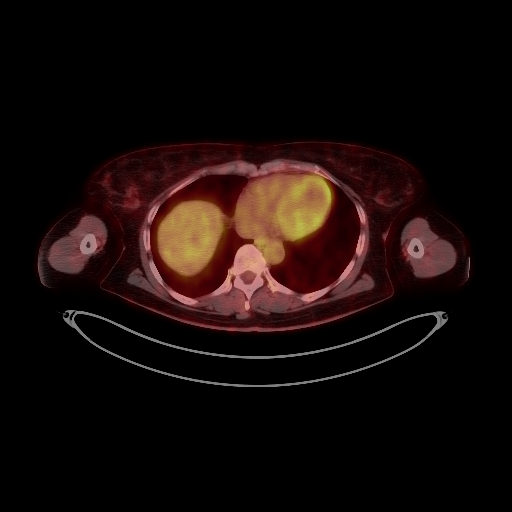
[im 422/422]
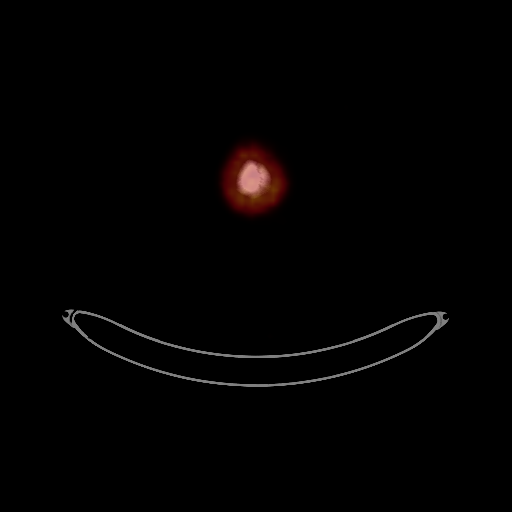

[25 of 25 positions shown; findings below may reference images not displayed]

FINDINGS: Mediastinal blood pool activity: SUV max

HEAD/NECK: No abnormal metabolic activity at the craniectomy site
along the LEFT frontal bone.

Incidental CT findings: none

CHEST: No hypermetabolic mediastinal or hilar nodes. No suspicious
pulmonary nodules on the CT scan.

Incidental CT findings: none

ABDOMEN/PELVIS: No abnormal hypermetabolic activity within the
liver, pancreas, adrenal glands, or spleen. No hypermetabolic lymph
nodes in the abdomen or pelvis.

Incidental CT findings: none

SKELETON: No focal hypermetabolic activity within the marrow to
suggest active multiple myeloma.

Focus of activity in the posterior RIGHT iliac bone presumably
related to bone marrow biopsy performed on 11/08/2019.

Incidental CT findings: No lytic lesions or expansile lesions to
localize multiple myeloma.

EXTREMITIES: No abnormal hypermetabolic activity in the lower
extremities.

Incidental CT findings: No lytic lesion
IMPRESSION: 1. No foci of metabolic activity within the skeleton to localize
active multiple myeloma.
2. No lytic lesions identified on the CT portion exam.
3. No abnormal activity at the LEFT frontal craniectomy site.
4. Benign metabolic activity in the RIGHT iliac bone related to
recent bone marrow biopsy.
5. No evidence of soft tissue plasmacytoma

## 2022-04-16 NOTE — Telephone Encounter (Signed)
I spoke with Alexandra [name] about her upcoming brain MRI and telephone follow-up appointments.   Mont Dutton R.T(R)(T) Radiation Special Procedures Navigator

## 2022-04-17 NOTE — Telephone Encounter (Signed)
Spoke with patient and she states she was previously receiving this medication (Latisse) from a Psychiatric nurse. She would like to continue use for eyelash loss d/t chemotherapy. Advised patient since we have not seen her for this previously she would need OV to discuss. Patient voiced understanding. Appointment scheduled.   FYI - thanks!

## 2022-04-21 ENCOUNTER — Other Ambulatory Visit (HOSPITAL_COMMUNITY): Payer: Self-pay

## 2022-04-23 ENCOUNTER — Other Ambulatory Visit: Payer: Self-pay

## 2022-04-23 ENCOUNTER — Encounter: Payer: Self-pay | Admitting: Hematology

## 2022-04-23 ENCOUNTER — Other Ambulatory Visit: Payer: Self-pay | Admitting: Nurse Practitioner

## 2022-04-23 ENCOUNTER — Other Ambulatory Visit (HOSPITAL_COMMUNITY): Payer: Self-pay

## 2022-04-23 DIAGNOSIS — C9000 Multiple myeloma not having achieved remission: Secondary | ICD-10-CM

## 2022-04-23 DIAGNOSIS — M1711 Unilateral primary osteoarthritis, right knee: Secondary | ICD-10-CM | POA: Diagnosis not present

## 2022-04-23 DIAGNOSIS — M1712 Unilateral primary osteoarthritis, left knee: Secondary | ICD-10-CM | POA: Diagnosis not present

## 2022-04-23 DIAGNOSIS — R262 Difficulty in walking, not elsewhere classified: Secondary | ICD-10-CM | POA: Diagnosis not present

## 2022-04-23 MED ORDER — LEVOCETIRIZINE DIHYDROCHLORIDE 5 MG PO TABS
5.0000 mg | ORAL_TABLET | Freq: Every evening | ORAL | 3 refills | Status: DC
  Filled 2022-04-23: qty 90, 90d supply, fill #0
  Filled 2022-08-25: qty 90, 90d supply, fill #1

## 2022-04-24 ENCOUNTER — Other Ambulatory Visit: Payer: Self-pay

## 2022-04-24 ENCOUNTER — Other Ambulatory Visit (HOSPITAL_COMMUNITY): Payer: Self-pay

## 2022-04-24 DIAGNOSIS — Z79899 Other long term (current) drug therapy: Secondary | ICD-10-CM | POA: Diagnosis not present

## 2022-04-24 DIAGNOSIS — Z92850 Personal history of chimeric antigen receptor t-cell therapy: Secondary | ICD-10-CM | POA: Diagnosis not present

## 2022-04-24 DIAGNOSIS — C9000 Multiple myeloma not having achieved remission: Secondary | ICD-10-CM | POA: Diagnosis not present

## 2022-04-24 DIAGNOSIS — M25561 Pain in right knee: Secondary | ICD-10-CM | POA: Diagnosis not present

## 2022-04-24 DIAGNOSIS — D801 Nonfamilial hypogammaglobulinemia: Secondary | ICD-10-CM | POA: Diagnosis not present

## 2022-04-24 DIAGNOSIS — M25562 Pain in left knee: Secondary | ICD-10-CM | POA: Diagnosis not present

## 2022-04-30 DIAGNOSIS — M6281 Muscle weakness (generalized): Secondary | ICD-10-CM | POA: Diagnosis not present

## 2022-04-30 DIAGNOSIS — M25612 Stiffness of left shoulder, not elsewhere classified: Secondary | ICD-10-CM | POA: Diagnosis not present

## 2022-04-30 DIAGNOSIS — S43422D Sprain of left rotator cuff capsule, subsequent encounter: Secondary | ICD-10-CM | POA: Diagnosis not present

## 2022-04-30 DIAGNOSIS — M25611 Stiffness of right shoulder, not elsewhere classified: Secondary | ICD-10-CM | POA: Diagnosis not present

## 2022-04-30 DIAGNOSIS — S43421D Sprain of right rotator cuff capsule, subsequent encounter: Secondary | ICD-10-CM | POA: Diagnosis not present

## 2022-05-02 ENCOUNTER — Other Ambulatory Visit: Payer: Self-pay | Admitting: Obstetrics and Gynecology

## 2022-05-02 DIAGNOSIS — E2839 Other primary ovarian failure: Secondary | ICD-10-CM

## 2022-05-06 ENCOUNTER — Encounter: Payer: Self-pay | Admitting: Hematology

## 2022-05-06 ENCOUNTER — Telehealth: Payer: Self-pay

## 2022-05-06 NOTE — Telephone Encounter (Signed)
Notified Patient of completion of Attending Physician's Statement-Progress Report. Fax transmission confirmation received. Copy of Form emailed to Patient as requested. No other needs or concerns voiced at this time.

## 2022-05-07 ENCOUNTER — Ambulatory Visit
Admission: RE | Admit: 2022-05-07 | Discharge: 2022-05-07 | Disposition: A | Discharge status: Home / Self Care | Payer: Medicare Other | Source: Ambulatory Visit | Attending: Obstetrics and Gynecology | Admitting: Obstetrics and Gynecology

## 2022-05-07 ENCOUNTER — Encounter: Payer: Self-pay | Admitting: Hematology

## 2022-05-07 DIAGNOSIS — Z780 Asymptomatic menopausal state: Secondary | ICD-10-CM | POA: Diagnosis not present

## 2022-05-07 DIAGNOSIS — E2839 Other primary ovarian failure: Secondary | ICD-10-CM

## 2022-05-07 DIAGNOSIS — M85851 Other specified disorders of bone density and structure, right thigh: Secondary | ICD-10-CM | POA: Diagnosis not present

## 2022-05-07 IMAGING — US IR IMAGING GUIDED PORT INSERTION
1 series · 2 of 2 positions shown · non-contrast
Comparison: None.

INDICATION: 54-year-old female with history of multiple myeloma requiring
central venous access for chemotherapy.

EXAM:
IMPLANTED PORT A CATH PLACEMENT WITH ULTRASOUND AND FLUOROSCOPIC
GUIDANCE

[Series 1: ir imaging guided port insertion · 2 of 2 slices shown]
[im 1/2]
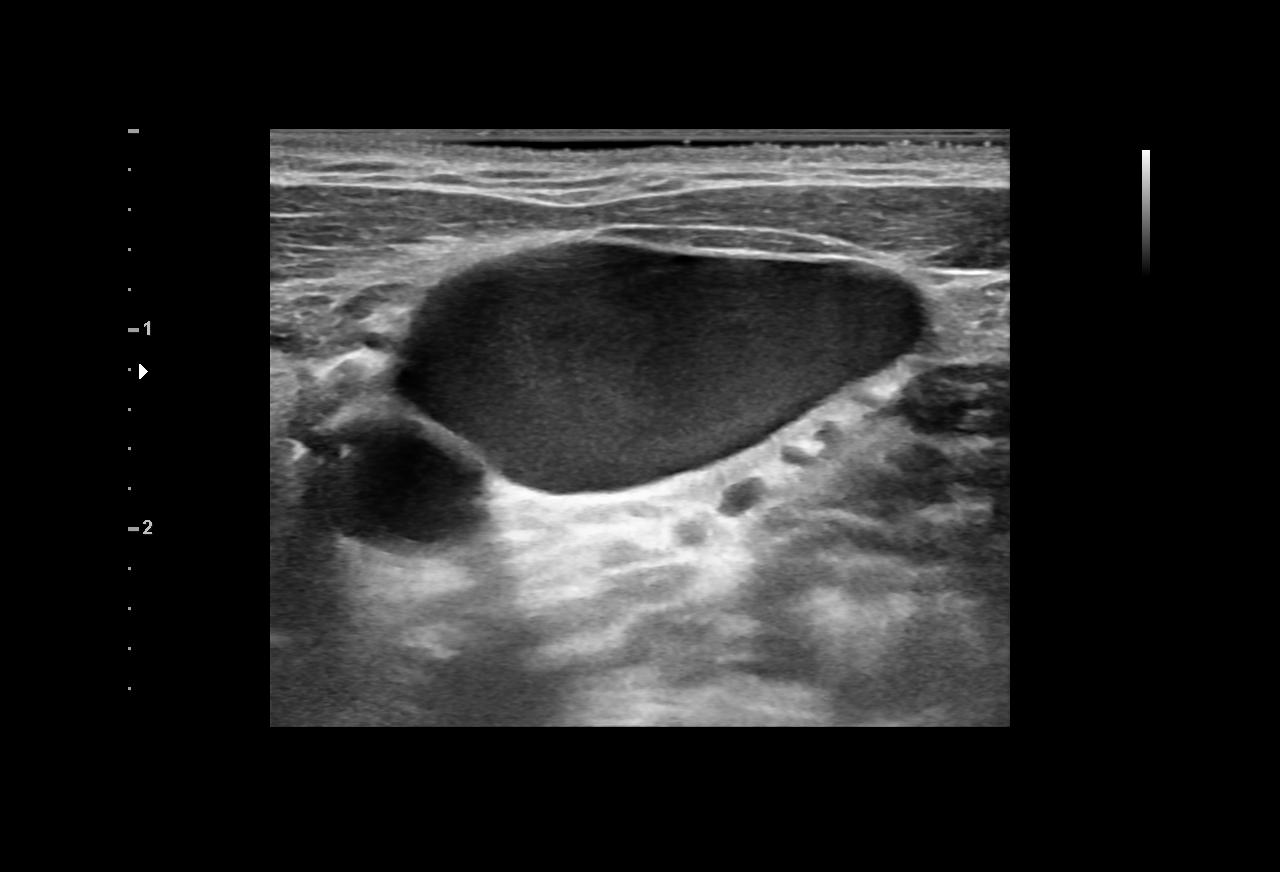
[im 2/2]
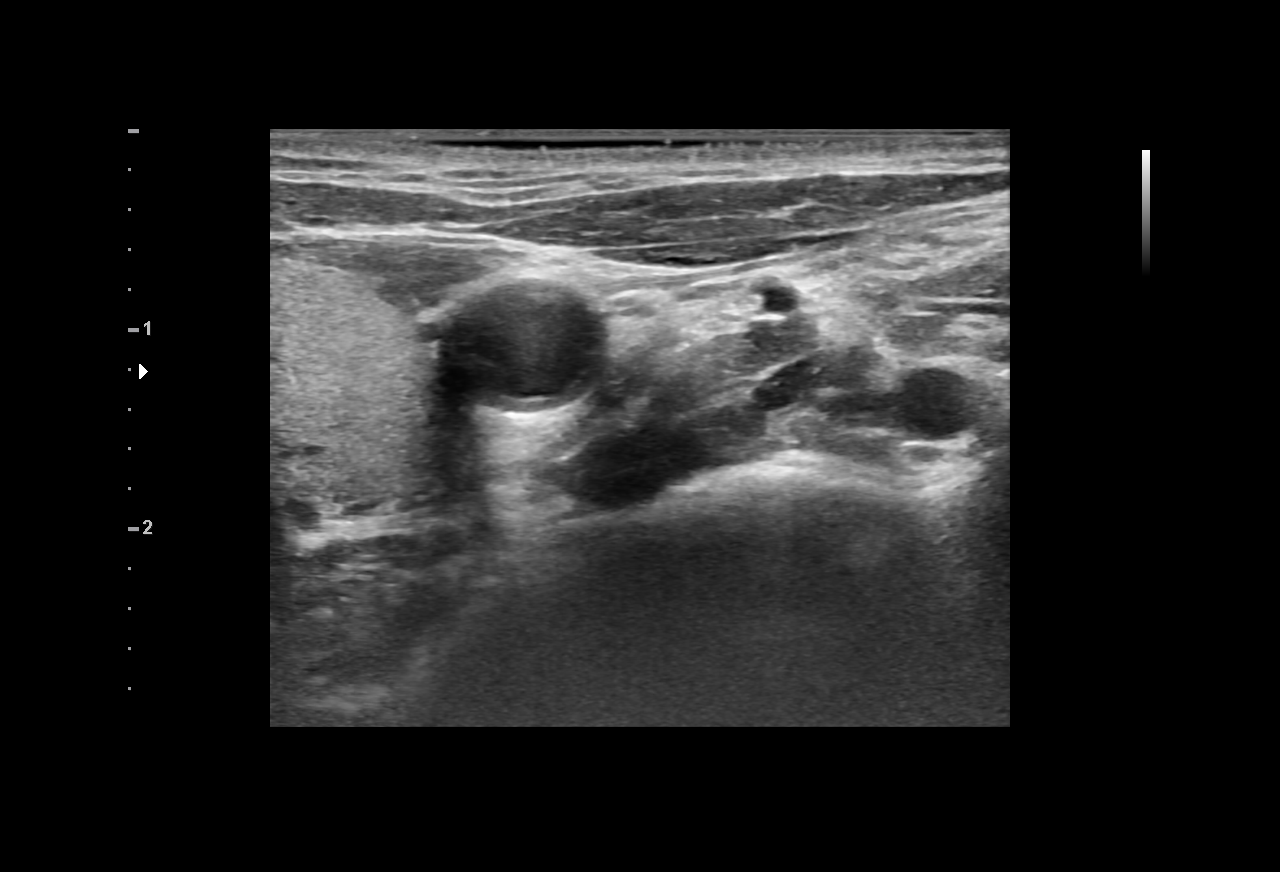

[2 of 2 positions shown; findings below may reference images not displayed]

MEDICATIONS:
Ancef 2 gm IV; The antibiotic was administered within an appropriate
time interval prior to skin puncture.

ANESTHESIA/SEDATION:
Moderate (conscious) sedation was employed during this procedure. A
total of Versed 1 mg and Fentanyl 50 mcg was administered
intravenously.

Moderate Sedation Time: 21 minutes. The patient's level of
consciousness and vital signs were monitored continuously by
radiology nursing throughout the procedure under my direct
supervision.

CONTRAST:  None

FLUOROSCOPY TIME:  0 minutes, 30 seconds (2 mGy)

COMPLICATIONS:
None immediate.

PROCEDURE:
The procedure, risks, benefits, and alternatives were explained to
the patient. Questions regarding the procedure were encouraged and
answered. The patient understands and consents to the procedure.

The right neck and chest were prepped with chlorhexidine in a
sterile fashion, and a sterile drape was applied covering the
operative field. Maximum barrier sterile technique with sterile
gowns and gloves were used for the procedure. A timeout was
performed prior to the initiation of the procedure.

Ultrasound was used to examine the jugular vein which was
compressible and free of internal echoes. A skin marker was used to
demarcate the planned venotomy and port pocket incision sites. Local
anesthesia was provided to these sites and the subcutaneous tunnel
track with 1% lidocaine with [DATE] epinephrine.

A small incision was created at the jugular access site and blunt
dissection was performed of the subcutaneous tissues. Under real
time ultrasound guidance, the jugular vein was accessed with a 21 ga
micropuncture needle and an 0.018" wire was inserted to the superior
vena cava. A 5 Fr micopuncture set was then used, through which a
0.035" Rosen wire was passed under fluoroscopic guidance into the
inferior vena cava. An 8 Fr dilator was then placed over the wire.

A subcutaneous port pocket was then created along the upper chest
wall utilizing a combination of sharp and blunt dissection. The
pocket was irrigated with sterile saline, packed with gauze, and
observed for hemorrhage. A single lumen "ISP" sized power injectable
port was chosen for placement. The 8 Fr catheter was tunneled from
the port pocket site to the venotomy incision. The port was placed
in the pocket. The external catheter was trimmed to appropriate
length. The dilator was exchanged for an 8 Fr peel-away sheath under
fluoroscopic guidance. The catheter was then placed through the
sheath and the sheath was removed. Final catheter positioning was
confirmed and documented with a fluoroscopic spot radiograph. The
port was accessed with Tin Cheung Baui needle, aspirated, and flushed with
heparinized saline.

The deep dermal layer of the port pocket incision was closed with
interrupted 3-0 Vicryl suture. The skin was opposed with a running
subcuticular 4-0 Monocryl suture. Dermabond was then placed over the
port pocket and neck incisions. The patient tolerated the procedure
well without immediate post procedural complication.
FINDINGS: After catheter placement, the tip lies within the cavoatrial
junction the catheter aspirates and flushes normally and is ready
for immediate use.
IMPRESSION: Successful placement of a power injectable Port-A-Cath via the right
internal jugular vein. The catheter is ready for immediate use.

## 2022-05-08 ENCOUNTER — Encounter: Payer: Self-pay | Admitting: Hematology

## 2022-05-15 ENCOUNTER — Ambulatory Visit (INDEPENDENT_AMBULATORY_CARE_PROVIDER_SITE_OTHER): Payer: Medicare Other | Admitting: Nurse Practitioner

## 2022-05-15 ENCOUNTER — Encounter: Payer: Self-pay | Admitting: Hematology

## 2022-05-15 ENCOUNTER — Other Ambulatory Visit (HOSPITAL_COMMUNITY): Payer: Self-pay

## 2022-05-15 VITALS — BP 108/58 | HR 75 | Temp 98.7°F | Ht 66.0 in | Wt 181.0 lb

## 2022-05-15 DIAGNOSIS — I10 Essential (primary) hypertension: Secondary | ICD-10-CM | POA: Diagnosis not present

## 2022-05-15 DIAGNOSIS — R7303 Prediabetes: Secondary | ICD-10-CM

## 2022-05-15 DIAGNOSIS — W19XXXA Unspecified fall, initial encounter: Secondary | ICD-10-CM | POA: Diagnosis not present

## 2022-05-15 DIAGNOSIS — R634 Abnormal weight loss: Secondary | ICD-10-CM | POA: Insufficient documentation

## 2022-05-15 DIAGNOSIS — K5730 Diverticulosis of large intestine without perforation or abscess without bleeding: Secondary | ICD-10-CM | POA: Insufficient documentation

## 2022-05-15 DIAGNOSIS — R194 Change in bowel habit: Secondary | ICD-10-CM | POA: Insufficient documentation

## 2022-05-15 DIAGNOSIS — R197 Diarrhea, unspecified: Secondary | ICD-10-CM | POA: Insufficient documentation

## 2022-05-15 DIAGNOSIS — R141 Gas pain: Secondary | ICD-10-CM | POA: Insufficient documentation

## 2022-05-15 DIAGNOSIS — C9000 Multiple myeloma not having achieved remission: Secondary | ICD-10-CM | POA: Diagnosis not present

## 2022-05-15 DIAGNOSIS — E782 Mixed hyperlipidemia: Secondary | ICD-10-CM | POA: Diagnosis not present

## 2022-05-15 MED ORDER — OZEMPIC (1 MG/DOSE) 4 MG/3ML ~~LOC~~ SOPN
1.0000 mg | PEN_INJECTOR | SUBCUTANEOUS | 1 refills | Status: DC
Start: 2022-05-15 — End: 2022-10-28
  Filled 2022-05-15: qty 3, 28d supply, fill #0
  Filled 2022-07-04: qty 9, 84d supply, fill #0
  Filled 2022-07-07: qty 3, 28d supply, fill #0

## 2022-05-15 NOTE — Progress Notes (Signed)
I,[name],acting as a Neurosurgeon for Alexandra Felts, FNP.,have documented all relevant documentation on the behalf of Alexandra Felts, FNP,as directed by  Alexandra Felts, FNP while in the presence of Alexandra Felts, FNP.    Subjective:     Patient ID: Alexandra [name] , female    DOB: Dec 11, 1965 , 57 y.o.   MRN: [id number]   Chief Complaint  Patient presents with   Medical Management of Chronic Issues   Medication Management    HPI  Patient presents today for medication management. Patient states she has stopped most of her medications during her MML treatments. She was experiencing a lot of joint pain, she is no longer on Levaquin. Her neutrophils had to improve. She has been taking Valacyclovir daily. Patient had also stopped Ozempic, recently restarted at the  dose 2 weeks ago and is doing well. She would like to know which medications she can restart at this time.   Wt Readings from Last 3 Encounters: 05/15/22 : 181 lb (82.1 kg) 03/31/22 : 190 lb (86.2 kg) 03/04/22 : 179 lb 14.4 oz (81.6 kg)  She was on some steroids while getting her treatments.  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    Hypertension This is a chronic problem. The current episode started more than 1 year ago. The problem has been gradually worsening since onset. The problem is controlled. Pertinent negatives include no anxiety, blurred vision, chest pain, headaches or palpitations. There are no associated agents to hypertension. Risk factors for coronary artery disease include family history and sedentary lifestyle. Past treatments include angiotensin blockers. The current treatment provides no improvement. There are no compliance problems.  There is no history of angina. There is no history of chronic renal disease.  Diabetes She presents for her follow-up diabetic visit. She has type 2 diabetes mellitus. No MedicAlert identification noted. Her disease course has been  stable. Pertinent negatives for hypoglycemia include no dizziness or headaches. Pertinent negatives for diabetes include no blurred vision, no chest pain, no fatigue, no polydipsia, no polyphagia and no polyuria. Symptoms are stable. There are no diabetic complications. Risk factors for coronary artery disease include hypertension and diabetes mellitus. Current diabetic treatments: Was on Victoza until May, no insurance since then. She is compliant with treatment all of the time. She is following a generally healthy diet. When asked about meal planning, she reported none. She has not had a previous visit with a dietitian. An ACE inhibitor/angiotensin II receptor blocker is being taken. She does not see a podiatrist.Eye exam is not current.     Past Medical History:  Diagnosis Date   Anxiety    Hypertension    Pre-diabetes      Family History  Problem Relation Age of Onset   Breast cancer Maternal Grandmother    Heart disease Mother    Hypertension Mother    Sarcoidosis Father    Diabetes Maternal Grandfather      Current Outpatient Medications:    bimatoprost (LATISSE) 0.03 % ophthalmic solution, Place one drop on applicator and apply evenly at the base of eyelashes nightly. Repeat procedure for the other eye., Disp: , Rfl:    levocetirizine (XYZAL) 5 MG tablet, Take 1 tablet (5 mg total) by mouth every evening., Disp: 90 tablet, Rfl: 3   meloxicam (MOBIC) 15 MG tablet, Take 1 tablet (15 mg total) by mouth daily., Disp: 30 tablet, Rfl: 11   traZODone (DESYREL) 50 MG tablet,  Take 1 - 2 tablets (50 - 100 mg total) by mouth at bedtime as needed for sleep, Disp: 180 tablet, Rfl: 1   valACYclovir (VALTREX) 500 MG tablet, Take 1 tablet by mouth 2 times a day, Disp: 60 tablet, Rfl: 10   atorvastatin (LIPITOR) 10 MG tablet, Take 1 tablet (10 mg total) by mouth daily. (Patient not taking: Reported on 03/04/2022), Disp: 90 tablet, Rfl: 1   OZEMPIC, 1 MG/DOSE, 4 MG/3ML SOPN, INJECT 1 MG UNDER THE SKIN  ONCE WEEKLY, ON THE SAME DAY., Disp: 9 mL, Rfl: 1   QUEtiapine (SEROQUEL) 25 MG tablet, Take 0.5-1 tablets (12.5-25 mg total) by mouth at bedtime., Disp: 30 tablet, Rfl: 2 No current facility-administered medications for this visit.  Facility-Administered Medications Ordered in Other Visits:    heparin lock flush 100 unit/mL, 500 Units, Intracatheter, Once, Kale, Corene Cornea, MD   sodium chloride flush (NS) 0.9 % injection 10 mL, 10 mL, Intracatheter, Once, Candise Che, Corene Cornea, MD   No Known Allergies   Review of Systems  Constitutional:  Negative for fatigue.  Eyes:  Negative for blurred vision.  Respiratory: Negative.    Cardiovascular: Negative.  Negative for chest pain, palpitations and leg swelling.  Endocrine: Negative for polydipsia, polyphagia and polyuria.  Neurological:  Negative for dizziness and headaches.  Psychiatric/Behavioral: Negative.    All other systems reviewed and are negative.    Today's Vitals   05/15/22 1408  BP: (!) 108/58  Pulse: 75  Temp: 98.7 F (37.1 C)  TempSrc: Oral  SpO2: 99%  Weight: 181 lb (82.1 kg)  Height: 5\' 6"  (1.676 m)   Body mass index is 29.21 kg/m.   Objective:  Physical Exam Vitals reviewed.  Constitutional:      General: She is not in acute distress.    Appearance: Normal appearance.  Cardiovascular:     Rate and Rhythm: Normal rate.     Pulses: Normal pulses.     Heart sounds: Normal heart sounds. No murmur heard. Pulmonary:     Effort: Pulmonary effort is normal. No respiratory distress.     Breath sounds: Normal breath sounds. No wheezing.  Neurological:     General: No focal deficit present.     Mental Status: She is alert and oriented to person, place, and time.     Cranial Nerves: No cranial nerve deficit.     Motor: No weakness.  Psychiatric:        Mood and Affect: Mood normal.        Behavior: Behavior normal.        Thought Content: Thought content normal.        Judgment: Judgment normal.          Assessment And Plan:     1. Prediabetes Comments: She has restarted Ozempic, I have discussed if her A1c remains less than 6.4 she may not be able to get Ozempic. - Hemoglobin A1c - OZEMPIC, 1 MG/DOSE, 4 MG/3ML SOPN; INJECT 1 MG UNDER THE SKIN ONCE WEEKLY, ON THE SAME DAY.  Dispense: 9 mL; Refill: 1  2. Mixed hyperlipidemia Comments: At this time will hold her statin. Will see what her lipid panel reveals - Lipid panel  3. Essential hypertension Comments: Blood pressure is well controlled, she can hold her blood pressure medications.  4. Fall, initial encounter Comments: Larey Seat in February after her treatments due to her joint pain. No injuries.  5. Multiple myeloma, remission status unspecified (HCC) Comments: Continue f/u with Oncology, no longer on  any treatments     Patient was given opportunity to ask questions. Patient verbalized understanding of the plan and was able to repeat key elements of the plan. All questions were answered to their satisfaction.  Alexandra Felts, FNP   I, Alexandra Felts, FNP, have reviewed all documentation for this visit. The documentation on 05/15/22 for the exam, diagnosis, procedures, and orders are all accurate and complete.   IF YOU HAVE BEEN REFERRED TO A SPECIALIST, IT MAY TAKE 1-2 WEEKS TO SCHEDULE/PROCESS THE REFERRAL. IF YOU HAVE NOT HEARD FROM US/SPECIALIST IN TWO WEEKS, PLEASE GIVE [place] A CALL AT [phone] X [phone].   THE PATIENT IS ENCOURAGED TO PRACTICE SOCIAL DISTANCING DUE TO THE COVID-19 PANDEMIC.

## 2022-05-16 ENCOUNTER — Other Ambulatory Visit: Payer: Self-pay

## 2022-05-16 LAB — LIPID PANEL
Chol/HDL Ratio: 3.7 ratio (ref 0.0–4.4)
Cholesterol, Total: 239 mg/dL — ABNORMAL HIGH (ref 100–199)
HDL: 65 mg/dL (ref >39)
LDL Chol Calc (NIH): 160 mg/dL — ABNORMAL HIGH (ref 0–99)
Triglycerides: 80 mg/dL (ref 0–149)
VLDL Cholesterol Cal: 14 mg/dL (ref 5–40)

## 2022-05-16 LAB — HEMOGLOBIN A1C
Est. average glucose Bld gHb Est-mCnc: 117 mg/dL
Hgb A1c MFr Bld: 5.7 % — ABNORMAL HIGH (ref 4.8–5.6)

## 2022-05-17 ENCOUNTER — Encounter: Payer: Self-pay | Admitting: Hematology

## 2022-05-19 ENCOUNTER — Other Ambulatory Visit (HOSPITAL_COMMUNITY): Payer: Self-pay

## 2022-05-19 DIAGNOSIS — Z634 Disappearance and death of family member: Secondary | ICD-10-CM | POA: Diagnosis not present

## 2022-05-19 DIAGNOSIS — G893 Neoplasm related pain (acute) (chronic): Secondary | ICD-10-CM | POA: Diagnosis not present

## 2022-05-19 DIAGNOSIS — G4700 Insomnia, unspecified: Secondary | ICD-10-CM | POA: Diagnosis not present

## 2022-05-19 MED ORDER — QUETIAPINE FUMARATE 25 MG PO TABS
ORAL_TABLET | ORAL | 2 refills | Status: DC
  Filled 2022-05-19: qty 30, 30d supply, fill #0
  Filled 2022-07-04 (×2): qty 30, 30d supply, fill #1

## 2022-05-22 ENCOUNTER — Other Ambulatory Visit: Payer: Self-pay

## 2022-05-22 DIAGNOSIS — C9000 Multiple myeloma not having achieved remission: Secondary | ICD-10-CM

## 2022-05-23 DIAGNOSIS — C9000 Multiple myeloma not having achieved remission: Secondary | ICD-10-CM | POA: Diagnosis not present

## 2022-05-23 DIAGNOSIS — Z92850 Personal history of chimeric antigen receptor t-cell therapy: Secondary | ICD-10-CM | POA: Diagnosis not present

## 2022-05-27 ENCOUNTER — Other Ambulatory Visit (HOSPITAL_COMMUNITY): Payer: Self-pay

## 2022-05-28 ENCOUNTER — Ambulatory Visit
Admission: RE | Admit: 2022-05-28 | Discharge: 2022-05-28 | Disposition: A | Discharge status: Home / Self Care | Payer: Medicare Other | Source: Ambulatory Visit | Attending: Nurse Practitioner | Admitting: Nurse Practitioner

## 2022-05-28 DIAGNOSIS — Z1231 Encounter for screening mammogram for malignant neoplasm of breast: Secondary | ICD-10-CM | POA: Diagnosis not present

## 2022-05-29 ENCOUNTER — Encounter: Payer: Self-pay | Admitting: Radiation Oncology

## 2022-05-31 ENCOUNTER — Encounter: Payer: Self-pay | Admitting: Hematology

## 2022-05-31 ENCOUNTER — Other Ambulatory Visit (HOSPITAL_COMMUNITY): Payer: Self-pay

## 2022-06-02 ENCOUNTER — Inpatient Hospital Stay: Payer: Medicare Other | Admitting: Hematology

## 2022-06-02 ENCOUNTER — Other Ambulatory Visit: Payer: Self-pay | Admitting: *Deleted

## 2022-06-02 ENCOUNTER — Other Ambulatory Visit: Payer: Self-pay

## 2022-06-02 ENCOUNTER — Inpatient Hospital Stay: Payer: Medicare Other | Attending: Hematology

## 2022-06-02 VITALS — BP 120/94 | HR 75 | Temp 97.5°F | Resp 18 | Ht 66.0 in | Wt 183.7 lb

## 2022-06-02 DIAGNOSIS — C9000 Multiple myeloma not having achieved remission: Secondary | ICD-10-CM

## 2022-06-02 DIAGNOSIS — C9001 Multiple myeloma in remission: Secondary | ICD-10-CM | POA: Diagnosis not present

## 2022-06-02 LAB — CMP (CANCER CENTER ONLY)
ALT: 11 U/L (ref 0–44)
AST: 16 U/L (ref 15–41)
Albumin: 4.3 g/dL (ref 3.5–5.0)
Alkaline Phosphatase: 43 U/L (ref 38–126)
Anion gap: 9 (ref 5–15)
BUN: 11 mg/dL (ref 6–20)
CO2: 28 mmol/L (ref 22–32)
Calcium: 9.1 mg/dL (ref 8.9–10.3)
Chloride: 106 mmol/L (ref 98–111)
Creatinine: 0.87 mg/dL (ref 0.44–1.00)
GFR, Estimated: >60 mL/min (ref >60)
Glucose, Bld: 89 mg/dL (ref 70–99)
Potassium: 4.2 mmol/L (ref 3.5–5.1)
Sodium: 143 mmol/L (ref 135–145)
Total Bilirubin: 0.5 mg/dL (ref 0.3–1.2)
Total Protein: 6.1 g/dL — ABNORMAL LOW (ref 6.5–8.1)

## 2022-06-02 LAB — CBC WITH DIFFERENTIAL (CANCER CENTER ONLY)
Abs Immature Granulocytes: 0 10*3/uL (ref 0.00–0.07)
Basophils Absolute: 0 10*3/uL (ref 0.0–0.1)
Basophils Relative: 1 %
Eosinophils Absolute: 0 10*3/uL (ref 0.0–0.5)
Eosinophils Relative: 1 %
HCT: 35.4 % — ABNORMAL LOW (ref 36.0–46.0)
Hemoglobin: 12.1 g/dL (ref 12.0–15.0)
Immature Granulocytes: 0 %
Lymphocytes Relative: 31 %
Lymphs Abs: 0.7 10*3/uL (ref 0.7–4.0)
MCH: 28.7 pg (ref 26.0–34.0)
MCHC: 34.2 g/dL (ref 30.0–36.0)
MCV: 84.1 fL (ref 80.0–100.0)
Monocytes Absolute: 0.3 10*3/uL (ref 0.1–1.0)
Monocytes Relative: 13 %
Neutro Abs: 1.2 10*3/uL — ABNORMAL LOW (ref 1.7–7.7)
Neutrophils Relative %: 54 %
Platelet Count: 215 10*3/uL (ref 150–400)
RBC: 4.21 MIL/uL (ref 3.87–5.11)
RDW: 15.6 % — ABNORMAL HIGH (ref 11.5–15.5)
WBC Count: 2.2 10*3/uL — ABNORMAL LOW (ref 4.0–10.5)
nRBC: 0 % (ref 0.0–0.2)

## 2022-06-02 LAB — MAGNESIUM: Magnesium: 1.9 mg/dL (ref 1.7–2.4)

## 2022-06-02 LAB — LACTATE DEHYDROGENASE: LDH: 124 U/L (ref 98–192)

## 2022-06-02 NOTE — Progress Notes (Signed)
HEMATOLOGY/ONCOLOGY CLNIC NOTE  Date of Service: 03/31/22   Patient Care Team: [name], FNP as PCP - General (General Practice)  CHIEF COMPLAINTS/PURPOSE OF CONSULTATION:  Follow-up for continued evaluation and management of multiple myeloma.  HISTORY OF PRESENTING ILLNESS:   Please see previous note for details  INTERVAL HISTORY: [name] is here for continued evaluation and management of her multiple myeloma. She received CAR-T therapy around 6 months ago.   Patient was last seen by me on 03/31/2022 and she complained of bilateral knee pain, joint pain, left elbow pain, bilateral shoulder pain, weight gain, body stiffness, bilateral ankle swelling, and occasional SOB while walking.   Patient reports she has been doing fairly well since out last visit. She complains of shoulder pain due to arthritis, joint pain, and bilateral knee pain but mainly right knee. She notes that she is unable to bend her right knee. She is currently taking Meloxicam for pain management.   She notes she recently had PET scan and her physician at [facility] has scheduled her bone marrow biopsy in June. Her brain MRI is scheduled next week.   She denies fever, chills, nausea, night sweats, infection issues, abdominal pain, chest pain, shortness of breath, back pain, bone pain, or leg swelling.   Patient notes she is not working currently.   She is taking Bactrim, but has discontinued Levaquin.   Patient continues to follow-up with her transplant team at [facility] and her next visit with them is on 06/20/2022.    ONCOLOGIC HISTORY   Lambda Light Chain Multiple Myeloma  A. 09/27/19: CT Maxillofacial enhancing dural-based anterior cranial fossa mass traversing the calvarium and extending into the extracalvarial soft tissues along with a 7.4 mm rightward midline shift at the level of anterior cranial fossa. MRI head with and without contrast is recommended for further evaluation. B.  10/05/19: MRI Head large extra-axial mass anterior left frontal region with bone erosion and extension into the scalp soft tissues. Considerations include hemangiopericytoma, metastasis, and aggressive hemangioma. 2.6 mm enhancing lesion in the clivus.  C. 10/10/19: Left craniectomy with frontal brain tumor/bone resection, path + plasma cell neoplasm, lambda restricted, plasmacytoma favored. Hgb 9.9,  D. 10/31/19: Initial Hem-Onc consultation; M-spike 0; IgG 627, IgQ 51, IgM 11; SFLC: Kappa 1.12 mg/dL, Lambda 295.62, ratio 1.30; IFE: serum + monoclonal free Lambda light chain; Hgb 10.1,  E. 11/04/19: 24 hour UPEP Total protein 1318, Free lambda light chains Ur 1332.46, ratio 0.02, M-spike 27.4, M-spike 24 hr at 361; Bence Jones positive, Lambda type F. 11/08/19: BMBx 22% atypical plasma cells, 30% by IHC; FISH Dup(1q) and Del(13q) detected G. 11/10/19: PET CT no metabolic activity within the skeleton to localize active myeloma; no lytic lesions identified; no abnormal activity at the left craniectomy site; no evidence of soft tissue plasmacytoma. H. 12/05/19: Started KRD; SFLC: Kappa 1.42, Lambda 177.97, ratio 0.01; Hgb 11.0 I. 12//13/21: SFLC: Kappa 1.45, Lambda 8.92, ratio 0.16; Hgb 11.1 J. 02/06/20: SFLC: Kappa 1.10, Lambda 2.08, ratio 0.53; Hgb 11.3 K. 02/26/20: Cycle 4 of KRD; SFLC: Kappa 1.14, Lambda 1.90, ratio 0.60; Hgb 10.8 L. 04/02/20: SFLC: Kappa 1.05, Lambda 1.56, ratio 0.67; Hgb 11.2 M. 04/30/20: SFLC: Kappa 1.01, Lambda 1.40, ratio 0.72; Hgb 11.5  N. 05/05/20: MRI Brain no evidence of left frontal bone lesion recurrence; Multiple subcentimeter area of nodular enhancement in the calvarium are nonspecific but could related to myeloma given nonvisualization on prior. O. 05/11/20: PET CT no findings to suggest FDG avid lesions  of multiple myeloma. P. 05/14/20: BMBx 1% plasma cells Q. 05/28/20: SFLC: Kappa 0.84, Lambda 1.16, ratio 0.72; Hgb 11.6  R. 06/11/20: EKG QT 408/QTc 424; PFT's: FEV1 110%, DLCO  58.8% predicted, 64.4% hgb corrected; ECHO: EF 55% est, 61% calc; GLS -20%; SFLC: Kappa 1.16, Lambda 1.40, ratio 0.83; Hgb 10.9-VGPR S. 07/06/20: SFLC: Kappa 1.16, Lambda 2.14, ratio 0.54; . Hgb 11.7, Plt 118 T. 6/20-7/12/22: Hospital admission for Melphalan 200 mg/m2 followed by reinfusion of 4.71 x 10^6/kg CD34+ autologous cells  MEDICAL HISTORY:  Past Medical History:  Diagnosis Date   Anxiety    Hypertension    Pre-diabetes     SURGICAL HISTORY: Past Surgical History:  Procedure Laterality Date   ABLATION     APPLICATION OF CRANIAL NAVIGATION N/A 10/10/2019   Procedure: APPLICATION OF CRANIAL NAVIGATION;  Surgeon: Lisbeth Renshaw, MD;  Location: [facility] OR;  Service: Neurosurgery;  Laterality: N/A;  APPLICATION OF CRANIAL NAVIGATION   CRANIOPLASTY Left 10/14/2019   Procedure: LEFT CRANIOPLASTY WITH PLACEMENT OF CUSTOM CRANIAL IMPLANT;  Surgeon: Lisbeth Renshaw, MD;  Location: [facility] OR;  Service: Neurosurgery;  Laterality: Left;   CRANIOTOMY Left 10/10/2019   Procedure: Left frontal Stereotactic craniectomy for resection of tumor;  Surgeon: Lisbeth Renshaw, MD;  Location: [facility] OR;  Service: Neurosurgery;  Laterality: Left;  Left frontal Stereotactic craniectomy for resection of tumor   HAMMER TOE SURGERY Left    IR IMAGING GUIDED PORT INSERTION  12/01/2019   IR IMAGING GUIDED PORT INSERTION  08/06/2021   TUBAL LIGATION      SOCIAL HISTORY: Social History   Socioeconomic History   Marital status: Single    Spouse name: Not on file   Number of children: Not on file   Years of education: Not on file   Highest education level: 12th grade  Occupational History   Not on file  Tobacco Use   Smoking status: Never   Smokeless tobacco: Never  Vaping Use   Vaping Use: Never used  Substance and Sexual Activity   Alcohol use: Yes    Comment: occassionally   Drug use: Not Currently   Sexual activity: Not on file  Other Topics Concern   Not on file  Social History Narrative   Not on  file   Social Determinants of Health   Financial Resource Strain: Medium Risk (08/13/2020)   Overall Financial Resource Strain (CARDIA)    Difficulty of Paying Living Expenses: Somewhat hard  Food Insecurity: Food Insecurity Present (08/13/2020)   Hunger Vital Sign    Worried About Running Out of Food in the Last Year: Sometimes true    Ran Out of Food in the Last Year: Never true  Transportation Needs: Unmet Transportation Needs (08/13/2020)   PRAPARE - Transportation    Lack of Transportation (Medical): No    Lack of Transportation (Non-Medical): Yes  Physical Activity: Sufficiently Active (08/13/2020)   Exercise Vital Sign    Days of Exercise per Week: 5 days    Minutes of Exercise per Session: 30 min  Stress: No Stress Concern Present (08/13/2020)   [employer] of Occupational Health - Occupational Stress Questionnaire    Feeling of Stress : Only a little  Social Connections: Moderately Integrated (08/13/2020)   Social Connection and Isolation Panel [NHANES]    Frequency of Communication with Friends and Family: More than three times a week    Frequency of Social Gatherings with Friends and Family: Once a week    Attends Religious Services: More than 4 times per year  Active Member of Clubs or Organizations: Yes    Attends Banker Meetings: 1 to 4 times per year    Marital Status: Divorced  Catering manager Violence: Not on file    FAMILY HISTORY: Family History  Problem Relation Age of Onset   Breast cancer Maternal Grandmother    Heart disease Mother    Hypertension Mother    Sarcoidosis Father    Diabetes Maternal Grandfather     ALLERGIES:  has No Known Allergies.  MEDICATIONS:  Current Outpatient Medications  Medication Sig Dispense Refill   atorvastatin (LIPITOR) 10 MG tablet Take 1 tablet (10 mg total) by mouth daily. (Patient not taking: Reported on 03/04/2022) 90 tablet 1   bimatoprost (LATISSE) 0.03 % ophthalmic solution Place one drop on  applicator and apply evenly at the base of eyelashes nightly. Repeat procedure for the other eye.     docusate sodium (COLACE) 100 MG capsule Take 100 mg by mouth 2 (two) times daily. (Patient not taking: Reported on 03/04/2022)     fluconazole (DIFLUCAN) 200 MG tablet Take 2 tablets (400 mg total) by mouth daily. 30 tablet 0   hydrochlorothiazide (HYDRODIURIL) 12.5 MG tablet Take 1 tablet by mouth daily as needed for leg swelling (Patient not taking: Reported on 10/30/2021) 90 tablet 1   levETIRAcetam (KEPPRA) 500 MG tablet Take 1 tablet (500 mg total) by mouth every 12 (twelve) hours. Start on day prior to CAR-T cell infusion and continue for 60 days after CAR-T cell infusion. 60 tablet 0   levocetirizine (XYZAL) 5 MG tablet TAKE 1 TABLET BY MOUTH EVERY DAY IN THE EVENING 90 tablet 3   meloxicam (MOBIC) 15 MG tablet Take 1 tablet (15 mg total) by mouth daily. 30 tablet 11   OZEMPIC, 1 MG/DOSE, 4 MG/3ML SOPN INJECT 1 MG UNDER THE SKIN ONCE WEEKLY, ON THE SAME DAY. (Patient not taking: Reported on 03/04/2022) 9 mL 1   pomalidomide (POMALYST) 2 MG capsule TAKE 1 CAPSULE BY MOUTH ONCE DAILY FOR 21 DAYS ON AND 7 DAYS OFF (Patient not taking: Reported on 03/04/2022) 21 capsule 0   POMALYST 2 MG capsule TAKE 1 CAPSULE BY MOUTH ONCE DAILY FOR 21 DAYS ON AND 7 DAYS OFF (Patient not taking: Reported on 03/04/2022) 21 capsule 0   sulfamethoxazole-trimethoprim (BACTRIM) 400-80 MG tablet Take 1 tablet by mouth once.     sulfamethoxazole-trimethoprim (BACTRIM) 400-80 MG tablet Take 1 tablet by mouth once daily. Continue until 6 months post CAR-T therapy. 30 tablet 5   traZODone (DESYREL) 50 MG tablet Take 1 - 2 tablets (50 - 100 mg total) by mouth at bedtime as needed for sleep 180 tablet 1   valACYclovir (VALTREX) 500 MG tablet Take 1 tablet by mouth 2 times a day 60 tablet 10   No current facility-administered medications for this visit.    REVIEW OF SYSTEMS:   .Marland Kitchen10 Point review of Systems was done is negative  except as noted above.  PHYSICAL EXAMINATION: ECOG PERFORMANCE STATUS: 1 - Symptomatic but completely ambulatory .BP (!) 140/77 (BP Location: Left Arm, Patient Position: Sitting)   Pulse 64   Temp (!) 97.5 F (36.4 C) (Oral)   Resp 16   Wt 190 lb (86.2 kg)   LMP 07/09/2015   SpO2 100%   BMI 30.67 kg/m  NAD GENERAL:alert, in no acute distress and comfortable SKIN: no acute rashes, no significant lesions EYES: conjunctiva are pink and non-injected, sclera anicteric OROPHARYNX: MMM, no exudates, no oropharyngeal erythema or ulceration  NECK: supple, no JVD LYMPH:  no palpable lymphadenopathy in the cervical, axillary or inguinal regions LUNGS: clear to auscultation b/l with normal respiratory effort HEART: regular rate & rhythm ABDOMEN:  normoactive bowel sounds , non tender, not distended. Extremity: no pedal edema PSYCH: alert & oriented x 3 with fluent speech NEURO: no focal motor/sensory deficits   LABORATORY DATA:  I have reviewed the data as listed     Latest Ref Rng & Units 06/02/2022   10:16 AM 04/10/2022   10:37 AM 03/31/2022   12:56 PM  CBC  WBC 4.0 - 10.5 K/uL 2.2  2.1  2.3   Hemoglobin 12.0 - 15.0 g/dL 16.1  09.6  04.5   Hematocrit 36.0 - 46.0 % 35.4  30.2  28.5   Platelets 150 - 400 K/uL 215  166  153   ANC 500     Latest Ref Rng & Units 06/02/2022   11:06 AM 03/31/2022   12:56 PM 03/04/2022    8:34 AM  CMP  Glucose 70 - 99 mg/dL 89  92  89   BUN 6 - 20 mg/dL 11  19  16    Creatinine 0.44 - 1.00 mg/dL 4.09  8.11  9.14   Sodium 135 - 145 mmol/L 143  140  140   Potassium 3.5 - 5.1 mmol/L 4.2  4.5  4.1   Chloride 98 - 111 mmol/L 106  107  106   CO2 22 - 32 mmol/L 28  27  28    Calcium 8.9 - 10.3 mg/dL 9.1  9.1  9.0   Total Protein 6.5 - 8.1 g/dL 6.1  5.6  5.6   Total Bilirubin 0.3 - 1.2 mg/dL 0.5  0.4  0.5   Alkaline Phos 38 - 126 U/L 43  32  40   AST 15 - 41 U/L 16  20  18    ALT 0 - 44 U/L 11  15  15      11/08/2019 FISH Plasma Cell Myeloma Prognostic  Panel:    11/08/2019 Cytogenetics:    11/08/2019 Bone Marrow Report [id number]):   10/10/2019 Surgical Pathology [phone]):   05/14/2020 Surgical Pathology "-Normocellular bone marrow for age with trilineage hematopoiesis and 1%  plasma cells  -See comment   PERIPHERAL BLOOD:  -Normocytic-normochromic anemia  -Leukopenia   COMMENT:   The bone marrow is generally normocellular for age with trilineage  hematopoiesis but with predominance of erythroid precursors.  The  myeloid changes are not considered specific and likely related to  therapy.  In this background, the plasma cells represent 1% of all cells  associated with scattered small clusters and display slight lambda light  chain excess.  The findings are limited but most suggestive of minimal  residual plasma cell neoplasm.  Correlation with cytogenetic and FISH  studies is recommended. "  RADIOGRAPHIC STUDIES:  I have personally reviewed the radiological images as listed and agreed with the findings in the report. MR Brain W Wo Contrast  Result Date: 03/28/2022 CLINICAL DATA:  Brain metastases, assess treatment response 3T SRS Protocol EXAM: MRI HEAD WITHOUT AND WITH CONTRAST TECHNIQUE: Multiplanar, multiecho pulse sequences of the brain and surrounding structures were obtained without and with intravenous contrast. CONTRAST:  8 mL of Vueway COMPARISON:  MRI head 12/26/2021. FINDINGS: Brain: Similar postsurgical change bifrontal craniectomy and cranioplasty with unchanged linear dural enhancement and thickening along the resection cavity. No new masslike enhancement to suggest recurrent/residual malignancy. Similar encephalomalacia/gliosis. No evidence of acute infarct, acute hemorrhage, mass lesion, midline  shift or hydrocephalus. Scattered T2/FLAIR hyperintensities in the white matter, likely the sequela of chronic microvascular ischemic disease. Vascular: Major arterial flow voids are maintained at the skull base.  Skull and upper cervical spine: Similar subcentimeter foci of enhancement within the Co barium. Sinuses/Orbits: Clear sinuses.  No acute orbital findings. Other: No mastoid effusions. IMPRESSION: 1. Similar postsurgical changes without new masslike enhancement to suggest residual/recurrent malignancy. 2. Similar calvarial enhancement. Electronically Signed   By: Feliberto Harts M.D.   On: 03/28/2022 08:34       Bone marrow biopsy on 10/14/2021:  DIAGNOSIS    A-C. Bone marrow, aspirate smear, touch prep, clot section, core biopsy and peripheral blood smear: - Plasma cell neoplasm, based on 3% atypical plasma cells on aspirate smear and MRD flow cytometry results with 0.2% abnormal lambda-monotypic plasma cells - Aspirate smear with erythroid predominance, no increase in blasts - Core biopsy and clot section limited for evaluation; touch prep unsatisfactory for evaluation - Congo Red stain negative for amyloid deposition - See comment  Electronically signed by Valarie Merino, MD on 10/17/2021 at  6:35 PM  Diagnostic Comment    Corresponding flow cytometry analysis, [id number], shows a plasma cell population with aberrant immunophenotype comprising 0.2% of analyzed events, supporting the diagnosis above.     PET scan on 10/14/2021: IMPRESSION:   1.  No definite hypermetabolic disease.   2.  Indeterminate focus of hypermetabolic activity in the right proximal  arm musculature which is favored posttraumatic or related to spasm. This is  without CT correlate, but assessment is limited given lack of IV contrast.  Recommend attention on follow-up given the focal nature of this activity.    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 No M spike. Lambda light chain myeloma + Anemia + bone lesion on skull-- resected No renal failure, hypercalcemia  BMBx- 22-30% lambda restricted plasma cell MolCy Dup (1q), del (13q)  Completed 7 cycles of KRD  The pt recently had a transplant at [facility]. DISEASE Lambda Light Chain Multiple Myeloma  - She will receive Melphalan 200 mg/m2 conditioning regimen, followed by autologous stem cell rescue (see details below).  TRANSPLANT: Preparative regimen:  - Melphalan 200 mg/m2 (355mg ) IV: 07/09/20  Stem Cells:  - Autologous stem cell reinfusion on 07/10/20. Patient received 4.71 x 10^6 CD34+/kg. - Her course was complicated by neutropenic fevers and Bcx 6/27 positive for E coli. She received IV cefepime from 6/27 until 6/30, which cefepime was switched to zosyn in the setting of new abdominal pain. CT abdomen/pelvis was performed that showed acute uncomplicated multifocal diverticulitis involving the transverse and descending colon. She was treated with bowel rest, prn pain medication, and IV zosyn, which was continued until engraftment on 7/5 and was transitioned to ppx ceftriaxone. Unfortunately, with the transition to ceftriaxone she had recurrent fevers and as a result was restarted on zosyn. Zosyn was transitioned to oral cipro/flagyl on 7/10 with the plan to continue for a 7 day course.    Bone marrow biopsy done on 01/18/2021 showed normocellular bone marrow with no morphologic evidence of plasma cell neoplasm. 24-hour UPEP on 01/22/2021 showed unremarkable immunofixation pattern and no monoclonal protein. PET CT scan 01/16/2021-negative for FDG avid lesions of myeloma MRI brain 01/12/2021-showed no  overt active myeloma lesions.  S/p CAR-T therapy - She received Flu/Cy lymph-depletion, followed by CAR-T (Abecma) infusion.  TRANSPLANT: Preparative Regimen:  - Fludarabine (30 mg/m2) 60 mg IV on 12/2121 - 1223/23 (Day -5 thru Day -3). - Cytoxan (300 mg/m2) 600 mg IV on  01/09/22 - 01/11/22 (Day -5 thru Day -3).  Stem Cells:  - CAR-T cell reinfusion on 01/14/22    3) prediabetes 4) hypertension 5) anxiety 6) dyslipidemia 7) s/p ABnormal LFts -- azole toxicity vs CRS  PLAN: -Discussed lab results from today, 06/02/2022, with the patient. CBC shows decreased WBC at 2.2 K and slightly decreased hemoglobin at 35.4%.  -Discussed PET scan results from 05/28/2022 with the patient, which did not show any evidence of hypermetabolic myeloma.  -Recommended to continue to follow-up with her transplant team and for vaccination and prophylactic Antimicrobials recommendations  -Answered all of patient's questions.    FOLLOW-UP: RTC with Dr Candise Che with labs in 2 months   The total time spent in the appointment was 25 minutes* .  All of the patient's questions were answered with apparent satisfaction. The patient knows to call the clinic with any problems, questions or concerns.   Wyvonnia Lora MD MS AAHIVMS [facility] Hematology/Oncology Physician [facility]  .*Total Encounter Time as defined by the Centers for Medicare and Medicaid Services includes, in addition to the face-to-face time of a patient visit (documented in the note above) non-face-to-face time: obtaining and reviewing outside history, ordering and reviewing medications, tests or procedures, care coordination (communications with other health care professionals or caregivers) and documentation in the medical record.   I, Ok Edwards, am acting as a Neurosurgeon for Wyvonnia Lora, MD. .I have reviewed the above documentation for accuracy and completeness, and I agree with the above. Johney Maine MD

## 2022-06-03 ENCOUNTER — Telehealth: Payer: Self-pay | Admitting: Hematology

## 2022-06-08 ENCOUNTER — Encounter: Payer: Self-pay | Admitting: Hematology

## 2022-06-09 ENCOUNTER — Other Ambulatory Visit (HOSPITAL_COMMUNITY): Payer: Self-pay

## 2022-06-11 ENCOUNTER — Telehealth: Payer: Self-pay

## 2022-06-11 ENCOUNTER — Other Ambulatory Visit (HOSPITAL_COMMUNITY): Payer: Self-pay

## 2022-06-11 NOTE — Telephone Encounter (Signed)
Per pharmacy Ozempic requires PA.  PA started via [facility] Key: [id number]

## 2022-06-12 ENCOUNTER — Other Ambulatory Visit (HOSPITAL_COMMUNITY): Payer: Self-pay

## 2022-06-12 ENCOUNTER — Other Ambulatory Visit: Payer: Self-pay

## 2022-07-01 ENCOUNTER — Ambulatory Visit
Admission: RE | Admit: 2022-07-01 | Discharge: 2022-07-01 | Disposition: A | Discharge status: Home / Self Care | Payer: Medicare Other | Source: Ambulatory Visit | Attending: Radiation Oncology | Admitting: Radiation Oncology

## 2022-07-01 DIAGNOSIS — C9031 Solitary plasmacytoma in remission: Secondary | ICD-10-CM | POA: Diagnosis not present

## 2022-07-01 DIAGNOSIS — G893 Neoplasm related pain (acute) (chronic): Secondary | ICD-10-CM | POA: Diagnosis not present

## 2022-07-01 DIAGNOSIS — G4700 Insomnia, unspecified: Secondary | ICD-10-CM | POA: Diagnosis not present

## 2022-07-01 MED ORDER — GADOPICLENOL 0.5 MMOL/ML IV SOLN
9.0000 mL | Freq: Once | INTRAVENOUS | Status: AC | PRN
  Administered 2022-07-01: 9 mL via INTRAVENOUS

## 2022-07-04 ENCOUNTER — Other Ambulatory Visit (HOSPITAL_COMMUNITY): Payer: Self-pay

## 2022-07-04 ENCOUNTER — Encounter: Payer: Self-pay | Admitting: Hematology

## 2022-07-04 DIAGNOSIS — I10 Essential (primary) hypertension: Secondary | ICD-10-CM | POA: Diagnosis not present

## 2022-07-04 DIAGNOSIS — E782 Mixed hyperlipidemia: Secondary | ICD-10-CM | POA: Diagnosis not present

## 2022-07-04 DIAGNOSIS — E1169 Type 2 diabetes mellitus with other specified complication: Secondary | ICD-10-CM | POA: Diagnosis not present

## 2022-07-04 DIAGNOSIS — G4709 Other insomnia: Secondary | ICD-10-CM | POA: Diagnosis not present

## 2022-07-04 MED ORDER — BENAZEPRIL-HYDROCHLOROTHIAZIDE 5-6.25 MG PO TABS
1.0000 | ORAL_TABLET | Freq: Every morning | ORAL | 1 refills | Status: DC
  Filled 2022-07-05: qty 90, 90d supply, fill #0
  Filled 2022-10-02: qty 90, 90d supply, fill #1

## 2022-07-05 ENCOUNTER — Other Ambulatory Visit (HOSPITAL_COMMUNITY): Payer: Self-pay

## 2022-07-07 ENCOUNTER — Ambulatory Visit
Admission: RE | Admit: 2022-07-07 | Discharge: 2022-07-07 | Disposition: A | Discharge status: Home / Self Care | Payer: Medicare Other | Source: Ambulatory Visit | Attending: Radiation Oncology | Admitting: Radiation Oncology

## 2022-07-07 ENCOUNTER — Other Ambulatory Visit: Payer: Self-pay

## 2022-07-07 ENCOUNTER — Other Ambulatory Visit (HOSPITAL_COMMUNITY): Payer: Self-pay

## 2022-07-07 DIAGNOSIS — C9000 Multiple myeloma not having achieved remission: Secondary | ICD-10-CM

## 2022-07-07 DIAGNOSIS — M25561 Pain in right knee: Secondary | ICD-10-CM | POA: Diagnosis not present

## 2022-07-07 DIAGNOSIS — C9002 Multiple myeloma in relapse: Secondary | ICD-10-CM | POA: Diagnosis not present

## 2022-07-07 DIAGNOSIS — D72819 Decreased white blood cell count, unspecified: Secondary | ICD-10-CM | POA: Diagnosis not present

## 2022-07-07 DIAGNOSIS — D630 Anemia in neoplastic disease: Secondary | ICD-10-CM | POA: Diagnosis not present

## 2022-07-07 DIAGNOSIS — Z92850 Personal history of chimeric antigen receptor t-cell therapy: Secondary | ICD-10-CM | POA: Diagnosis not present

## 2022-07-07 DIAGNOSIS — Z792 Long term (current) use of antibiotics: Secondary | ICD-10-CM | POA: Diagnosis not present

## 2022-07-07 DIAGNOSIS — D696 Thrombocytopenia, unspecified: Secondary | ICD-10-CM | POA: Diagnosis not present

## 2022-07-07 DIAGNOSIS — Z79899 Other long term (current) drug therapy: Secondary | ICD-10-CM | POA: Diagnosis not present

## 2022-07-07 DIAGNOSIS — M25562 Pain in left knee: Secondary | ICD-10-CM | POA: Diagnosis not present

## 2022-07-07 NOTE — Progress Notes (Signed)
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   Interval Since Last Radiation:  13 months  06/03/21 - 06/14/21:   The recurrent tumor nodule in the left frontotemporal skull was treated to 30 Gy in 10 fractions and the cranioplasty surgical bed was simultaneously treated to 20 Gy in 10 fractions.   Narrative:  I contacted the patient to conduct her routine scheduled 3 month follow up visit to review the results of her recent MRI brain scan via telephone to spare the patient unnecessary potential exposure in the healthcare setting during the current COVID-19 pandemic.  The patient was notified in advance and gave permission to proceed with this visit format.   She tolerated radiation treatment relatively well without any acute ill side effects.                               On review of systems, she is doing very well in general and without complaints.  She tolerated the treatment very well with some mild alopecia as expected but otherwise denies any ill side effects associated with treatment. She noted some headaches since being on Seroquel, but noticed they stopped when she stopped taking the medication. She denies changes in visual or auditory acuity, difficulty with speech, imbalance, tremors or seizure activity.  She has continued in observation, under the care of Dr. Candise Che, s/p a successful stem cell transplant at [facility] in 12/2021. Her most recent PET on 05/28/2022 showed no evidence of hypermetabolic myeloma. Aside from some joint aches and pains, she has tolerated the transplant well and continues in follow up with the transplant team at [facility] as well. She denies any paresthesias or focal weakness.  She had a recent  posttreatment brain MRI on 07/01/22 which shows no new or progressive enhancement since her last scan on 03/27/2022. We reviewed these results today.   ALLERGIES:  has No Known Allergies.  Meds: Current Outpatient Medications  Medication Sig Dispense Refill   atorvastatin (LIPITOR) 10 MG tablet Take 1 tablet (10 mg total) by mouth daily. (Patient not taking: Reported on 03/04/2022) 90 tablet 1   benazepril-hydrochlorthiazide (LOTENSIN HCT) 5-6.25 MG tablet Take 1 tablet by mouth in the morning. 90 tablet 1   bimatoprost (LATISSE) 0.03 % ophthalmic solution Place one drop on applicator and apply evenly at the base of eyelashes nightly. Repeat procedure for the other eye.     levocetirizine (XYZAL) 5 MG tablet Take 1 tablet (5 mg total) by mouth every evening. 90 tablet 3   meloxicam (MOBIC) 15 MG tablet Take 1 tablet (15 mg total) by mouth daily. 30 tablet 11   OZEMPIC, 1 MG/DOSE, 4 MG/3ML SOPN Inject 1 mg into the skin once a week on the same day each week. 9 mL 1   QUEtiapine (SEROQUEL) 25 MG tablet Take 0.5-1 tablets (12.5-25 mg total) by mouth at bedtime. 30 tablet 2   traZODone (DESYREL) 50 MG tablet Take 1 - 2 tablets (50 - 100 mg total) by mouth at bedtime as needed for sleep 180 tablet 1   valACYclovir (VALTREX) 500 MG tablet Take 1 tablet by mouth 2 times a day 60 tablet 10  No current facility-administered medications for this encounter.   Facility-Administered Medications Ordered in Other Encounters  Medication Dose Route Frequency Provider Last Rate Last Admin   heparin lock flush 100 unit/mL  500 Units Intracatheter Once Johney Maine, MD       sodium chloride flush (NS) 0.9 % injection 10 mL  10 mL Intracatheter Once Johney Maine, MD        Physical Findings:  vitals were not taken for this visit.   /10 Unable to assess due to telephone follow-up visit format.  Lab Findings: Lab Results  Component Value Date   WBC 2.2 (L) 06/02/2022   HGB 12.1 06/02/2022    HCT 35.4 (L) 06/02/2022   MCV 84.1 06/02/2022   PLT 215 06/02/2022     Radiographic Findings: MR Brain W Wo Contrast  Result Date: 07/07/2022 CLINICAL DATA:  Follow-up plasmacytoma. EXAM: MRI HEAD WITHOUT AND WITH CONTRAST TECHNIQUE: Multiplanar, multiecho pulse sequences of the brain and surrounding structures were obtained without and with intravenous contrast. CONTRAST:  9 cc Vueway COMPARISON:  Brain MRI 03/27/2022. FINDINGS: Brain: Postsurgical changes reflecting bifrontal craniotomy and cranioplasty are again seen. The fluid collection fluid collection underlying the cranioplasty measuring up to 5 mm in thickness in the axial plane is unchanged. Mild dural thickening and enhancement underlying the craniotomy is unchanged. There is no new, progressive, or masslike enhancement. A small region of encephalomalacia and gliosis in the underlying left frontal lobe is unchanged. There is no acute intracranial hemorrhage, extra-axial fluid collection, or acute infarct. Mild background chronic small-vessel ischemic changes in the supratentorial white matter and pons are stable. There is no new or suspicious parenchymal signal abnormality. The pituitary and suprasellar region are normal. There is no midline shift. Vascular: Normal flow voids. Skull and upper cervical spine: Scattered subcentimeter enhancing lesions in the calvarium are stable. Postsurgical changes reflecting bifrontal craniotomy and cranioplasty as above. Sinuses/Orbits: The paranasal sinuses are clear. The globes and orbits are unremarkable Other: The mastoid air cells and middle ear cavities are clear. IMPRESSION: Stable exam since 03/27/2022, with no new or progressive enhancement at the cranioplasty site. Electronically Signed   By: Lesia Hausen M.D.   On: 07/07/2022 14:44    Impression/Plan: 1. 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   She has recovered well from the effects of her  previous radiation and remains without complaints.  She has continued to tolerate her recent stem cell transplant and remains in observation, under the care of Dr. Candise Che. She also continues in routine follow up with her transplant team at [facility].  Her recent posttreatment brain MRI from 06/30/2021 shows a very stable appearance and no new lesions identified.  We discussed the plan to continue to closely monitor with serial MRI brain scans every 6 months since she is now 1 year out from treatment with stable scans.  We will follow-up by telephone following each scan to review results and recommendations from the multidisciplinary brain conference.  She appears to have a good understanding of these recommendations and is comfortable and in agreement with the stated plan.  She was advised to call at anytime with any questions or concerns in the interim.  I personally spent 20 minutes in this encounter including chart review, reviewing radiological studies, telephone conversation with the patient, entering orders and completing documentation.    Joyice Faster, PA-C

## 2022-07-08 ENCOUNTER — Other Ambulatory Visit: Payer: Self-pay

## 2022-07-08 DIAGNOSIS — C9002 Multiple myeloma in relapse: Secondary | ICD-10-CM | POA: Diagnosis not present

## 2022-07-10 ENCOUNTER — Other Ambulatory Visit (HOSPITAL_COMMUNITY): Payer: Self-pay

## 2022-07-21 DIAGNOSIS — C9001 Multiple myeloma in remission: Secondary | ICD-10-CM | POA: Diagnosis not present

## 2022-07-21 DIAGNOSIS — D801 Nonfamilial hypogammaglobulinemia: Secondary | ICD-10-CM | POA: Diagnosis not present

## 2022-07-21 DIAGNOSIS — C9000 Multiple myeloma not having achieved remission: Secondary | ICD-10-CM | POA: Diagnosis not present

## 2022-07-21 DIAGNOSIS — C7951 Secondary malignant neoplasm of bone: Secondary | ICD-10-CM | POA: Diagnosis not present

## 2022-07-22 ENCOUNTER — Other Ambulatory Visit (HOSPITAL_COMMUNITY): Payer: Self-pay

## 2022-07-28 ENCOUNTER — Ambulatory Visit: Payer: Medicare Other | Admitting: Hematology

## 2022-07-28 ENCOUNTER — Other Ambulatory Visit: Payer: Medicare Other

## 2022-08-01 ENCOUNTER — Other Ambulatory Visit: Payer: Self-pay

## 2022-08-01 DIAGNOSIS — C9000 Multiple myeloma not having achieved remission: Secondary | ICD-10-CM

## 2022-08-02 ENCOUNTER — Emergency Department (HOSPITAL_COMMUNITY)
Admission: EM | Admit: 2022-08-02 | Discharge: 2022-08-03 | Disposition: A | Discharge status: Home / Self Care | Payer: Medicare Other | Attending: Emergency Medicine | Admitting: Emergency Medicine

## 2022-08-02 ENCOUNTER — Encounter (HOSPITAL_COMMUNITY): Payer: Self-pay

## 2022-08-02 DIAGNOSIS — Z85830 Personal history of malignant neoplasm of bone: Secondary | ICD-10-CM | POA: Diagnosis not present

## 2022-08-02 DIAGNOSIS — U071 COVID-19: Secondary | ICD-10-CM | POA: Diagnosis not present

## 2022-08-02 DIAGNOSIS — R7303 Prediabetes: Secondary | ICD-10-CM | POA: Insufficient documentation

## 2022-08-02 DIAGNOSIS — R509 Fever, unspecified: Secondary | ICD-10-CM | POA: Diagnosis present

## 2022-08-02 DIAGNOSIS — I10 Essential (primary) hypertension: Secondary | ICD-10-CM | POA: Insufficient documentation

## 2022-08-02 MED ORDER — ACETAMINOPHEN 325 MG PO TABS
650.0000 mg | ORAL_TABLET | Freq: Once | ORAL | Status: AC | PRN
  Administered 2022-08-02: 650 mg via ORAL
  Filled 2022-08-02: qty 2

## 2022-08-02 NOTE — ED Triage Notes (Signed)
Pt. Arrives POV c/o covid symptoms. Pt. States that she took a home test and it came back positive. She endorses chills, head pressure and fatigue.

## 2022-08-03 ENCOUNTER — Other Ambulatory Visit: Payer: Self-pay

## 2022-08-03 LAB — RESP PANEL BY RT-PCR (RSV, FLU A&B, COVID)  RVPGX2
Influenza A by PCR: NEGATIVE
Influenza B by PCR: NEGATIVE
Resp Syncytial Virus by PCR: NEGATIVE
SARS Coronavirus 2 by RT PCR: POSITIVE — AB

## 2022-08-03 MED ORDER — ONDANSETRON 4 MG PO TBDP: 4.0000 mg | ORAL_TABLET | Freq: Three times a day (TID) | ORAL | 0 refills | Status: AC | PRN

## 2022-08-03 NOTE — Discharge Instructions (Signed)
You may take over-the-counter medicine for symptomatic relief, such as Tylenol, Motrin, TheraFlu, Alka seltzer , black elderberry, etc. Please limit acetaminophen (Tylenol) to 4000 mg and Ibuprofen (Motrin, Advil, etc.) to 2400 mg for a 24hr period. Please note that other over-the-counter medicine may contain acetaminophen or ibuprofen as a component of their ingredients.   Take your Paxlovid as prescribed.

## 2022-08-03 NOTE — ED Provider Notes (Signed)
[place] EMERGENCY DEPARTMENT AT [facility] Provider Note  CSN: [id number] Arrival date & time: 08/02/22 2332  Chief Complaint(s) Fever, Cough, and Headache  HPI Alexandra [name] is a 57 y.o. female with a past medical history listed below including multiple myeloma currently in remission who presents to the emergency department with 1 day of generalized malaise, myalgias, headache and fever.  Reports taken at home COVID test that was positive.  She called her primary care provider who prescribed her Paxlovid.  She called her oncologist and the on-call nurse recommended she present to the emergency department for evaluation.  She has some mild cough but denying any shortness of breath.  No nausea or vomiting.  No other physical complaints.  The history is provided by the patient.    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   Epigastric pain 05/20/2021   Diverticulitis of colon 05/20/2021   Constipation 05/20/2021   Conditioning chemotherapy prior to peripheral blood stem cell transplant 06/27/2020   Port-A-Cath in place 01/30/2020   Multiple myeloma (HCC) 11/28/2019   Counseling regarding advance care planning and goals of care 11/28/2019   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 Authorizing Provider  atorvastatin (LIPITOR) 10 MG tablet Take 1 tablet (10 mg total) by mouth daily. Patient not taking: Reported on 03/04/2022 02/28/22   [name], FNP  benazepril-hydrochlorthiazide (LOTENSIN HCT) 5-6.25 MG tablet Take 1 tablet by mouth in the morning. 07/04/22     bimatoprost (LATISSE) 0.03 % ophthalmic solution Place one drop on applicator and apply evenly at the base of eyelashes nightly. Repeat procedure for the other eye. 01/08/22   [provider]  levocetirizine (XYZAL) 5 MG tablet Take 1 tablet (5 mg total) by mouth every evening. 04/23/22   [name], FNP  meloxicam (MOBIC) 15 MG tablet Take 1 tablet (15 mg total) by mouth daily. 03/25/22     OZEMPIC, 1 MG/DOSE, 4 MG/3ML SOPN Inject 1 mg into the skin once a week on the same day each week. 05/15/22   [name], FNP  traZODone (DESYREL) 50 MG tablet Take 1 - 2 tablets (50 - 100 mg total) by mouth at bedtime as needed for sleep 01/02/22   Johney Maine, MD  valACYclovir (VALTREX) 500 MG tablet Take 1 tablet by mouth 2 times a day 09/17/21     QUEtiapine (SEROQUEL) 25 MG tablet Take 0.5-1 tablets (12.5-25 mg total) by mouth at bedtime. 05/19/22 07/21/22  Allergies Patient has no known allergies.  Review of Systems Review of Systems As noted in HPI  Physical Exam Vital Signs  I have reviewed the triage vital signs BP 118/81   Pulse 75   Temp 100.2 F (37.9 C) (Oral)   Resp 17   LMP 07/09/2015   SpO2 98%   Physical Exam Vitals reviewed.  Constitutional:      General: She is not in acute distress.    Appearance: She is well-developed. She is not diaphoretic.  HENT:     Head: Normocephalic and atraumatic.      Nose: Nose normal.  Eyes:     General: No scleral icterus.       Right eye: No discharge.        Left eye: No discharge.     Conjunctiva/sclera: Conjunctivae normal.     Pupils: Pupils are equal, round, and reactive to light.  Cardiovascular:     Rate and Rhythm: Normal rate and regular rhythm.     Heart sounds: No murmur heard.    No friction rub. No gallop.  Pulmonary:     Effort: Pulmonary effort is normal. No respiratory distress.     Breath sounds: Normal breath sounds. No stridor. No rales.  Abdominal:     General: There is no distension.     Palpations: Abdomen is soft.     Tenderness: There is no abdominal tenderness.  Musculoskeletal:        General: No tenderness.     Cervical back: Normal range of motion and neck supple.  Skin:    General: Skin is warm and dry.     Findings: No erythema or rash.  Neurological:     Mental Status: She is alert and oriented to person, place, and time.     ED Results and Treatments Labs (all labs ordered are listed, but only abnormal results are displayed) Labs Reviewed  RESP PANEL BY RT-PCR (RSV, FLU A&B, COVID)  [id number] - Abnormal; Notable for the following components:      Result Value   SARS Coronavirus 2 by RT PCR POSITIVE (*)    All other components within normal limits                                                                                                                         EKG  EKG Interpretation Date/Time:    Ventricular Rate:    PR Interval:    QRS Duration:    QT Interval:    QTC Calculation:   R Axis:      Text Interpretation:         Radiology No results found.  Medications Ordered in ED Medications  acetaminophen (TYLENOL) tablet 650 mg (650 mg Oral Given 08/02/22 2349)   Procedures Procedures  (including critical care time) Medical Decision Making / ED Course   Medical Decision Making Risk OTC drugs.    Patient presents with viral symptoms for 1 day. Adequate oral hydration.  Rest of history as above.  Patient appears well. No signs of toxicity, patient is interactive. No hypoxia, tachypnea or other signs of respiratory distress. No sign of clinical dehydration. Lung exam clear. Rest of exam as above.  Most consistent with viral illness.  Confirmed COVID-positive testing.  Chest x-ray not indicated at this time.  Discussed symptomatic treatment with the patient and they will follow closely with their PCP.      Final Clinical Impression(s) / ED Diagnoses Final diagnoses:  COVID-19 virus infection   The patient appears reasonably screened and/or stabilized for discharge and I doubt any other medical condition or other [facility] requiring further screening, evaluation, or treatment in the ED at this time. I have discussed the findings, Dx and Tx plan with the patient/family who expressed understanding and agree(s) with the plan. Discharge instructions discussed at length. The patient/family was given strict return precautions who verbalized understanding of the instructions. No further questions at time of discharge.  Disposition: Discharge  Condition: Good  ED Discharge Orders     None        Follow Up: [name], FNP [address] [place] [address] [phone]  Call  to schedule an appointment for close follow up    This chart was dictated using voice recognition software.  Despite best efforts to proofread,  errors can occur which can change the documentation meaning.    Nira Conn, MD 08/03/22 [phone]

## 2022-08-04 ENCOUNTER — Other Ambulatory Visit (HOSPITAL_COMMUNITY): Payer: Self-pay

## 2022-08-04 ENCOUNTER — Inpatient Hospital Stay: Payer: Medicare Other

## 2022-08-04 ENCOUNTER — Encounter: Payer: 59 | Admitting: Nurse Practitioner

## 2022-08-04 ENCOUNTER — Inpatient Hospital Stay: Payer: Medicare Other | Admitting: Hematology

## 2022-08-04 ENCOUNTER — Telehealth: Payer: Self-pay

## 2022-08-04 NOTE — Transitions of Care (Post Inpatient/ED Visit) (Signed)
   08/04/2022  Name: Alexandra [name] MRN: [id number] DOB: December 30, 1965  Today's TOC FU Call Status: Today's TOC FU Call Status:: Successful TOC FU Call Competed TOC FU Call Complete Date: 08/04/22  Transition Care Management Follow-up Telephone Call Date of Discharge: 08/02/22 Discharge Facility: [facility]) Type of Discharge: Inpatient Admission Primary Inpatient Discharge Diagnosis:: covid How have you been since you were released from the hospital?: Same Any questions or concerns?: No  Items Reviewed: Did you receive and understand the discharge instructions provided?: Yes Medications obtained,verified, and reconciled?: Yes (Medications Reviewed) Any new allergies since your discharge?: Yes Dietary orders reviewed?: No Do you have support at home?: Yes People in Home: child(ren), adult  Medications Reviewed Today: Medications Reviewed Today     Reviewed by Marlyn Corporal, CMA (Certified Medical Assistant) on 08/04/22 at [phone]  Med List Status: <None>   Medication Order Taking? Sig Documenting Provider Last Dose Status Informant  atorvastatin (LIPITOR) 10 MG tablet [id number] No Take 1 tablet (10 mg total) by mouth daily.  Patient not taking: Reported on 03/04/2022   [name], FNP Not Taking Active   benazepril-hydrochlorthiazide (LOTENSIN HCT) 5-6.25 MG tablet [id number] Yes Take 1 tablet by mouth in the morning.  Taking Active   bimatoprost (LATISSE) 0.03 % ophthalmic solution [id number] Yes Place one drop on applicator and apply evenly at the base of eyelashes nightly. Repeat procedure for the other eye. [provider] Taking Active   levocetirizine (XYZAL) 5 MG tablet [id number] Yes Take 1 tablet (5 mg total) by mouth every evening. [name], FNP Taking Active   meloxicam [facility]) 15 MG tablet [id number] Yes Take 1 tablet (15 mg total) by mouth daily.  Taking Active   ondansetron (ZOFRAN-ODT) 4 MG disintegrating tablet [id number] Yes Take 1 tablet (4 mg total)  by mouth every 8 (eight) hours as needed for up to 3 days for nausea or vomiting. Nira Conn, MD Taking Active   OZEMPIC, 1 MG/DOSE, 4 MG/3ML Namon Cirri [id number] Yes Inject 1 mg into the skin once a week on the same day each week. [name], FNP Taking Active      Discontinued 07/21/22 1040 (External Source Cancellation)   traZODone (DESYREL) 50 MG tablet [id number] Yes Take 1 - 2 tablets (50 - 100 mg total) by mouth at bedtime as needed for sleep Johney Maine, MD Taking Active   valACYclovir (VALTREX) 500 MG tablet [id number] Yes Take 1 tablet by mouth 2 times a day  Taking Active   Med List Note Otis Peak, [facility] 11/26/21 1202): Pomalyst filled through [facility]            Home Care and Equipment/Supplies: Were Home Health Services Ordered?: No Any new equipment or medical supplies ordered?: No  Functional Questionnaire: Do you need assistance with bathing/showering or dressing?: No Do you need assistance with meal preparation?: No Do you need assistance with eating?: No Do you have difficulty maintaining continence: No Do you need assistance with getting out of bed/getting out of a chair/moving?: No Do you have difficulty managing or taking your medications?: No  Follow up appointments reviewed: PCP Follow-up appointment confirmed?: No MD Provider Line Number:[phone] Given: Yes Specialist Hospital Follow-up appointment confirmed?: No Do you need transportation to your follow-up appointment?: No Do you understand care options if your condition(s) worsen?: Yes-patient verbalized understanding    SIGNATURE Lisabeth Devoid, CMA

## 2022-08-08 ENCOUNTER — Telehealth: Payer: Self-pay | Admitting: Hematology

## 2022-08-08 NOTE — Telephone Encounter (Signed)
Patient is aware of upcoming appointment times/dates.  

## 2022-08-11 DIAGNOSIS — M25512 Pain in left shoulder: Secondary | ICD-10-CM | POA: Diagnosis not present

## 2022-08-11 DIAGNOSIS — E782 Mixed hyperlipidemia: Secondary | ICD-10-CM | POA: Diagnosis not present

## 2022-08-11 DIAGNOSIS — I10 Essential (primary) hypertension: Secondary | ICD-10-CM | POA: Diagnosis not present

## 2022-08-11 DIAGNOSIS — U099 Post covid-19 condition, unspecified: Secondary | ICD-10-CM | POA: Diagnosis not present

## 2022-08-17 ENCOUNTER — Other Ambulatory Visit: Payer: Self-pay | Admitting: Hematology

## 2022-08-17 NOTE — Progress Notes (Signed)
IVIG orders placed. Next dose to be co-ordinated with [facility]. Message my RN to confirm and send scheduling message accordingly

## 2022-08-19 ENCOUNTER — Telehealth: Payer: Self-pay | Admitting: Hematology

## 2022-08-19 NOTE — Telephone Encounter (Signed)
Patient is aware of upcoming appointments times/dates 

## 2022-08-25 ENCOUNTER — Other Ambulatory Visit: Payer: Self-pay | Admitting: Physician Assistant

## 2022-08-25 DIAGNOSIS — C9000 Multiple myeloma not having achieved remission: Secondary | ICD-10-CM

## 2022-08-26 ENCOUNTER — Other Ambulatory Visit (HOSPITAL_COMMUNITY): Payer: Self-pay

## 2022-08-26 ENCOUNTER — Inpatient Hospital Stay (HOSPITAL_BASED_OUTPATIENT_CLINIC_OR_DEPARTMENT_OTHER): Payer: Medicare Other | Admitting: Physician Assistant

## 2022-08-26 ENCOUNTER — Other Ambulatory Visit: Payer: Self-pay

## 2022-08-26 ENCOUNTER — Inpatient Hospital Stay: Payer: Medicare Other | Attending: Hematology

## 2022-08-26 VITALS — BP 116/76 | HR 64 | Temp 97.9°F | Resp 16 | Wt 182.9 lb

## 2022-08-26 DIAGNOSIS — C9001 Multiple myeloma in remission: Secondary | ICD-10-CM | POA: Insufficient documentation

## 2022-08-26 DIAGNOSIS — D801 Nonfamilial hypogammaglobulinemia: Secondary | ICD-10-CM | POA: Diagnosis not present

## 2022-08-26 DIAGNOSIS — C9000 Multiple myeloma not having achieved remission: Secondary | ICD-10-CM

## 2022-08-26 DIAGNOSIS — T451X5A Adverse effect of antineoplastic and immunosuppressive drugs, initial encounter: Secondary | ICD-10-CM | POA: Insufficient documentation

## 2022-08-26 LAB — CMP (CANCER CENTER ONLY)
ALT: 16 U/L (ref 0–44)
AST: 18 U/L (ref 15–41)
Albumin: 4.3 g/dL (ref 3.5–5.0)
Alkaline Phosphatase: 54 U/L (ref 38–126)
Anion gap: 6 (ref 5–15)
BUN: 14 mg/dL (ref 6–20)
CO2: 29 mmol/L (ref 22–32)
Calcium: 9.2 mg/dL (ref 8.9–10.3)
Chloride: 106 mmol/L (ref 98–111)
Creatinine: 0.91 mg/dL (ref 0.44–1.00)
GFR, Estimated: >60 mL/min (ref >60)
Glucose, Bld: 93 mg/dL (ref 70–99)
Potassium: 4.1 mmol/L (ref 3.5–5.1)
Sodium: 141 mmol/L (ref 135–145)
Total Bilirubin: 0.7 mg/dL (ref 0.3–1.2)
Total Protein: 6.4 g/dL — ABNORMAL LOW (ref 6.5–8.1)

## 2022-08-26 LAB — CBC WITH DIFFERENTIAL (CANCER CENTER ONLY)
Abs Immature Granulocytes: 0.01 10*3/uL (ref 0.00–0.07)
Basophils Absolute: 0 10*3/uL (ref 0.0–0.1)
Basophils Relative: 0 %
Eosinophils Absolute: 0 10*3/uL (ref 0.0–0.5)
Eosinophils Relative: 1 %
HCT: 33.3 % — ABNORMAL LOW (ref 36.0–46.0)
Hemoglobin: 11.3 g/dL — ABNORMAL LOW (ref 12.0–15.0)
Immature Granulocytes: 0 %
Lymphocytes Relative: 26 %
Lymphs Abs: 0.8 10*3/uL (ref 0.7–4.0)
MCH: 29.6 pg (ref 26.0–34.0)
MCHC: 33.9 g/dL (ref 30.0–36.0)
MCV: 87.2 fL (ref 80.0–100.0)
Monocytes Absolute: 0.4 10*3/uL (ref 0.1–1.0)
Monocytes Relative: 14 %
Neutro Abs: 1.9 10*3/uL (ref 1.7–7.7)
Neutrophils Relative %: 59 %
Platelet Count: 213 10*3/uL (ref 150–400)
RBC: 3.82 MIL/uL — ABNORMAL LOW (ref 3.87–5.11)
RDW: 16.9 % — ABNORMAL HIGH (ref 11.5–15.5)
WBC Count: 3.2 10*3/uL — ABNORMAL LOW (ref 4.0–10.5)
nRBC: 0 % (ref 0.0–0.2)

## 2022-08-26 LAB — LACTATE DEHYDROGENASE: LDH: 138 U/L (ref 98–192)

## 2022-08-26 NOTE — Progress Notes (Unsigned)
HEMATOLOGY/ONCOLOGY CLNIC NOTE  Date of Service: 08/26/22   Patient Care Team: [name], FNP as PCP - General (General Practice)  CHIEF COMPLAINTS/PURPOSE OF CONSULTATION:  Follow-up for continued evaluation and management of multiple myeloma.  ONCOLOGIC HISTORY: 1. Lambda Light Chain Multiple Myeloma A. 09/27/19: CT Maxillofacial enhancing dural-based anterior cranial fossa mass traversing the calvarium and extending into the extracalvarial soft tissues along with a 7.4 mm rightward midline shift at the level of anterior cranial fossa. MRI head with and without contrast is recommended for further evaluation. B. 10/05/19: MRI Head large extra-axial mass anterior left frontal region with bone erosion and extension into the scalp soft tissues. Considerations include hemangiopericytoma, metastasis, and aggressive hemangioma. 2.6 mm enhancing lesion in the clivus.  C. 10/10/19: Left craniectomy with frontal brain tumor/bone resection, path + plasma cell neoplasm, lambda restricted, plasmacytoma favored. Hgb 9.9,  D. 10/31/19: Initial Hem-Onc consultation; M-spike 0; IgG 627, IgQ 51, IgM 11; SFLC: Kappa 1.12 mg/dL, Lambda 161.09, ratio 6.04; IFE: serum + monoclonal free Lambda light chain; Hgb 10.1,  E. 11/04/19: 24 hour UPEP Total protein 1318, Free lambda light chains Ur 1332.46, ratio 0.02, M-spike 27.4, M-spike 24 hr at 361; Bence Jones positive, Lambda type F. 11/08/19: BMBx 22% atypical plasma cells, 30% by IHC; FISH Dup(1q) and Del(13q) detected G. 11/10/19: PET CT no metabolic activity within the skeleton to localize active myeloma; no lytic lesions identified; no abnormal activity at the left craniectomy site; no evidence of soft tissue plasmacytoma. H. 12/05/19: Started KRD; SFLC: Kappa 1.42, Lambda 177.97, ratio 0.01; Hgb 11.0 I. 12//13/21: SFLC: Kappa 1.45, Lambda 8.92, ratio 0.16; Hgb 11.1 J. 02/06/20: SFLC: Kappa 1.10, Lambda 2.08, ratio 0.53; Hgb 11.3 K. 02/26/20: Cycle 4 of KRD;  SFLC: Kappa 1.14, Lambda 1.90, ratio 0.60; Hgb 10.8 L. /14/22: SFLC: Kappa 1.05, Lambda 1.56, ratio 0.67; Hgb 11.2 M. 04/30/20: SFLC: Kappa 1.01, Lambda 1.40, ratio 0.72; Hgb 11.5  N. 05/05/20: MRI Brain no evidence of left frontal bone lesion recurrence; Multiple subcentimeter area of nodular enhancement in the calvarium are nonspecific but could related to myeloma given nonvisualization on prior. O. 05/11/20: PET CT no findings to suggest FDG avid lesions of multiple myeloma. P. 05/14/20: BMBx 1% plasma cells Q. 05/28/20: SFLC: Kappa 0.84, Lambda 1.16, ratio 0.72; Hgb 11.6  R. 06/11/20: EKG QT 408/QTc 424; PFT's: FEV1 110%, DLCO 58.8% predicted, 64.4% hgb corrected; ECHO: EF 55% est, 61% calc; GLS -20%; SFLC: Kappa 1.16, Lambda 1.40, ratio 0.83; Hgb 10.9-VGPR S. 07/06/20: SFLC: Kappa 1.16, Lambda 2.14, ratio 0.54; . Hgb 11.7, Plt 118 T. 6/20-7/12/22: Hospital admission for Melphalan 200 mg/m2 followed by reinfusion of 4.71 x 10^6/kg CD34+ autologous cells U. 08/22/20: M-spike 0; SFLC wnl; IFE IgG kappa w/ co-migrating lambda light chain. V. 09/26/20: M-spike 0; SFLC wnl; IFE faint IgG lambda component. W. 10/22: Started maintenance Revlimid 10 mg po qd 21/28 X. 01/18/21: BMBx negative for plasma cell neoplasm; M-spike 0; SFLC wnl; PET CT no abnormal foci of increased uptake within the skeleton or lymph nodes to suggest metabolically active multiple myeloma; MRI Brain scattered sub-centimeter calvarium enhancing lesions are stable but suspicious for myeloma. Still, a larger enhancing lesion at the clivus seen in 2021 remains resolved. No evidence of metastatic disease to the brain.  Y. 01/22/21: 24 hour urine 140 mg/24 hr, negative for monoclonal protein Z. 03/07/21: M-spike 0; SFLC wnl; IFE negative Aa. 03/21/21: Due to aggressive nature of pt's MM, Carfilzomib added every other week to Revlimid maintenance Ab.  04/04/21: Developed new lump growing on left forehead shortly after Kyprolis start with headache Ac.  05/03/21: MRI Brain recurrent tumor anterior left temporalis, along the inferior margin of craniectomy flap. Close association with the lateral wall left orbit without evidence of invasion. No evidence of intracranial spread.  Ad. 05/24/21: PET CT hypermetabolic soft tissue nodule along anterior aspect of the left temporalis muscle, suspicious for plasmacytoma recurrence. No abnormal foci of increased uptake within the skeleton to suggest metabolically active osseous multiple myeloma involvement; M-spike 0; Lambda 3.72. Ae: 06/03/21: XRT Left temple mass Ad. 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 10/14/21: No M-spike; SFLC wnl; IFE faint IgG kappa; BMBx 3% abnormal plasma cells on aspirate; FISH unable to result; normal female karyotype; PET scan No definitive hypermetabolic disease Af. 10/30/21: M-spike 0.1; Lambda 2.74 Ag. 12/19/21: M-spike 0; Lambda 8.62 Ah. 01/08/22: No M-spike pending; SFLC kappa 0.93, lambda 25.18, ratio 0.04; IFE with lambda light chain in the beta region which cannot be quantified due to overlap with background beta globulins; a region of constricted electrophoretic mobility is present in IgG kappa. Ai. 01/09/2022 - 01/11/2022: Fludarabine (30 mg/m2) 60 mg IV and Cytoxan (300 mg/m2) 600 mg IV on Day -5 thru Day -3. Aj. 01/14/2022: Abecma infusion 426.16 x 10^6 anti-BCMA02 CAR+ T cells (per [facility]ecma COA document) given 9100/10B DCT. Ak. 3. CAR-T Toxicities (date/grade): 01/16/23 CRS G1 First temp spike 12/27 in early AM, Tmax 39.4. FFWU grossly negative. She met criteria for CRS G1 and received 2 doses of Toci (12/28, 12/29). Cefepime (12/27-12/31) and Vancomycin (12/27-12/28). She was deescalated back to levaquin with no infectious source and defevesced fevers. Her last fever was on 12/29. All culture data negative. Al. Post CAR-T severe joint pain. 07/21/2022 resolving and remaining symptoms could potentially be  arthritis. Am. 07/07/2022 bone marrow biopsy MRD negative FISH and karyotype pending. SPEP shows a M spike of 0.34 in the gamma region. IFE shows no clear monoclonal component, free light chains kappa 0.18 lambda 0.28 with a ratio of 0.64. Beta-2 1.92   INTERVAL HISTORY: Ms. Slate is here for continued evaluation and management of her multiple myeloma. She was last seen by Dr. Candise Che on 06/02/2022. In the interim, he had a follow up with [facility] team on 07/21/2022 with recommendations for monthly IVIG for IgG levels less than 400.   Ms. Dechristopher reports having good energy and appetite. She is able to complete her ADLs on her own. Her joint pain has greatly improved especially in her knees. She still has left shoulder arthritic pain and is planning to recent steroid injection. She denies nausea, vomiting or bowel habit changes. She denies easy bruising or signs of active bleeding. She denies fevers, chills, sweats, shortness of breath, chest pain or cough. She has no other complaints.      MEDICAL HISTORY:  Past Medical History:  Diagnosis Date   Anxiety    Hypertension    Pre-diabetes     SURGICAL HISTORY: Past Surgical History:  Procedure Laterality Date   ABLATION     APPLICATION OF CRANIAL NAVIGATION N/A 10/10/2019   Procedure: APPLICATION OF CRANIAL NAVIGATION;  Surgeon: Lisbeth Renshaw, MD;  Location: [facility] OR;  Service: Neurosurgery;  Laterality: N/A;  APPLICATION OF CRANIAL NAVIGATION   CRANIOPLASTY Left 10/14/2019   Procedure: LEFT CRANIOPLASTY WITH PLACEMENT OF CUSTOM CRANIAL IMPLANT;  Surgeon: Lisbeth Renshaw, MD;  Location: [facility] OR;  Service: Neurosurgery;  Laterality: Left;  CRANIOTOMY Left 10/10/2019   Procedure: Left frontal Stereotactic craniectomy for resection of tumor;  Surgeon: Lisbeth Renshaw, MD;  Location: [facility] OR;  Service: Neurosurgery;  Laterality: Left;  Left frontal Stereotactic craniectomy for resection of tumor   HAMMER TOE SURGERY Left    IR IMAGING GUIDED  PORT INSERTION  12/01/2019   IR IMAGING GUIDED PORT INSERTION  08/06/2021   TUBAL LIGATION      SOCIAL HISTORY: Social History   Socioeconomic History   Marital status: Single    Spouse name: Not on file   Number of children: Not on file   Years of education: Not on file   Highest education level: Associate degree: academic program  Occupational History   Not on file  Tobacco Use   Smoking status: Never   Smokeless tobacco: Never  Vaping Use   Vaping status: Never Used  Substance and Sexual Activity   Alcohol use: Yes    Comment: occassionally   Drug use: Not Currently   Sexual activity: Not on file  Other Topics Concern   Not on file  Social History Narrative   Not on file   Social Determinants of Health   Financial Resource Strain: Medium Risk (05/15/2022)   Overall Financial Resource Strain (CARDIA)    Difficulty of Paying Living Expenses: Somewhat hard  Food Insecurity: Food Insecurity Present (05/15/2022)   Hunger Vital Sign    Worried About Running Out of Food in the Last Year: Sometimes true    Ran Out of Food in the Last Year: Never true  Transportation Needs: No Transportation Needs (05/15/2022)   PRAPARE - Administrator, Civil Service (Medical): No    Lack of Transportation (Non-Medical): No  Physical Activity: Insufficiently Active (05/15/2022)   Exercise Vital Sign    Days of Exercise per Week: 1 day    Minutes of Exercise per Session: 30 min  Stress: Stress Concern Present (05/15/2022)   [employer] of Occupational Health - Occupational Stress Questionnaire    Feeling of Stress : To some extent  Social Connections: Moderately Isolated (05/15/2022)   Social Connection and Isolation Panel [NHANES]    Frequency of Communication with Friends and Family: Once a week    Frequency of Social Gatherings with Friends and Family: Once a week    Attends Religious Services: More than 4 times per year    Active Member of [employer] or Organizations: Yes     Attends Engineer, structural: More than 4 times per year    Marital Status: Divorced  Catering manager Violence: Not on file    FAMILY HISTORY: Family History  Problem Relation Age of Onset   Breast cancer Maternal Grandmother    Heart disease Mother    Hypertension Mother    Sarcoidosis Father    Diabetes Maternal Grandfather     ALLERGIES:  has No Known Allergies.  MEDICATIONS:  Current Outpatient Medications  Medication Sig Dispense Refill   atorvastatin (LIPITOR) 10 MG tablet Take 1 tablet (10 mg total) by mouth daily. 90 tablet 1   benazepril-hydrochlorthiazide (LOTENSIN HCT) 5-6.25 MG tablet Take 1 tablet by mouth in the morning. 90 tablet 1   Bioflavonoid Products (BIOFLEX PO) Take by mouth.     Cholecalciferol (VITAMIN D-3 PO) Take by mouth daily.     ECHINACEA PO Take by mouth.     GLUCOSAMINE-CHONDROITIN PO Take by mouth daily. Two tablets daily     levocetirizine (XYZAL) 5 MG tablet Take 1 tablet (5 mg  total) by mouth every evening. 90 tablet 3   meloxicam (MOBIC) 15 MG tablet Take 1 tablet (15 mg total) by mouth daily. 30 tablet 11   Multiple Vitamins-Minerals (EMERGEN-C IMMUNE PO) Take by mouth daily. Two tablets     OZEMPIC, 1 MG/DOSE, 4 MG/3ML SOPN Inject 1 mg into the skin once a week on the same day each week. 9 mL 1   traMADol (ULTRAM) 50 MG tablet TAKE 1 TABLET BY MOUTH EVERY DAY AS NEEDED FOR PAIN     traZODone (DESYREL) 50 MG tablet Take 1 - 2 tablets (50 - 100 mg total) by mouth at bedtime as needed for sleep 180 tablet 1   valACYclovir (VALTREX) 500 MG tablet Take 1 tablet by mouth 2 times a day 60 tablet 10   bimatoprost (LATISSE) 0.03 % ophthalmic solution Place one drop on applicator and apply evenly at the base of eyelashes nightly. Repeat procedure for the other eye. (Patient not taking: Reported on 08/26/2022)     No current facility-administered medications for this visit.   Facility-Administered Medications Ordered in Other Visits   Medication Dose Route Frequency Provider Last Rate Last Admin   heparin lock flush 100 unit/mL  500 Units Intracatheter Once Johney Maine, MD       sodium chloride flush (NS) 0.9 % injection 10 mL  10 mL Intracatheter Once Johney Maine, MD        REVIEW OF SYSTEMS:   .Marland Kitchen10 Point review of Systems was done is negative except as noted above.  PHYSICAL EXAMINATION: ECOG PERFORMANCE STATUS: 1 - Symptomatic but completely ambulatory .BP 116/76 (BP Location: Left Arm, Patient Position: Sitting)   Pulse 64   Temp 97.9 F (36.6 C) (Oral)   Resp 16   Wt 182 lb 14.4 oz (83 kg)   LMP 07/09/2015   SpO2 100%   BMI 29.52 kg/m  NAD GENERAL:alert, in no acute distress and comfortable SKIN: no acute rashes, no significant lesions EYES: conjunctiva are pink and non-injected, sclera anicteric OROPHARYNX: MMM, no exudates, no oropharyngeal erythema or ulceration NECK: supple, no JVD LYMPH:  no palpable lymphadenopathy in the cervical, axillary or inguinal regions LUNGS: clear to auscultation b/l with normal respiratory effort HEART: regular rate & rhythm ABDOMEN:  normoactive bowel sounds , non tender, not distended. Extremity: no pedal edema PSYCH: alert & oriented x 3 with fluent speech NEURO: no focal motor/sensory deficits   LABORATORY DATA:  I have reviewed the data as listed     Latest Ref Rng & Units 08/26/2022   11:12 AM 06/02/2022   10:16 AM 04/10/2022   10:37 AM  CBC  WBC 4.0 - 10.5 K/uL 3.2  2.2  2.1   Hemoglobin 12.0 - 15.0 g/dL 16.1  09.6  04.5   Hematocrit 36.0 - 46.0 % 33.3  35.4  30.2   Platelets 150 - 400 K/uL 213  215  166   ANC 500     Latest Ref Rng & Units 06/02/2022   11:06 AM 03/31/2022   12:56 PM 03/04/2022    8:34 AM  CMP  Glucose 70 - 99 mg/dL 89  92  89   BUN 6 - 20 mg/dL 11  19  16    Creatinine 0.44 - 1.00 mg/dL 4.09  8.11  9.14   Sodium 135 - 145 mmol/L 143  140  140   Potassium 3.5 - 5.1 mmol/L 4.2  4.5  4.1   Chloride 98 - 111 mmol/L  106  107  106   CO2 22 - 32 mmol/L 28  27  28    Calcium 8.9 - 10.3 mg/dL 9.1  9.1  9.0   Total Protein 6.5 - 8.1 g/dL 6.1  5.6  5.6   Total Bilirubin 0.3 - 1.2 mg/dL 0.5  0.4  0.5   Alkaline Phos 38 - 126 U/L 43  32  40   AST 15 - 41 U/L 16  20  18    ALT 0 - 44 U/L 11  15  15      11/08/2019 FISH Plasma Cell Myeloma Prognostic Panel:    11/08/2019 Cytogenetics:    11/08/2019 Bone Marrow Report [phone]):   10/10/2019 Surgical Pathology [phone]):   05/14/2020 Surgical Pathology "-Normocellular bone marrow for age with trilineage hematopoiesis and 1%  plasma cells  -See comment   PERIPHERAL BLOOD:  -Normocytic-normochromic anemia  -Leukopenia   COMMENT:   The bone marrow is generally normocellular for age with trilineage  hematopoiesis but with predominance of erythroid precursors.  The  myeloid changes are not considered specific and likely related to  therapy.  In this background, the plasma cells represent 1% of all cells  associated with scattered small clusters and display slight lambda light  chain excess.  The findings are limited but most suggestive of minimal  residual plasma cell neoplasm.  Correlation with cytogenetic and FISH  studies is recommended. "  RADIOGRAPHIC STUDIES:  I have personally reviewed the radiological images as listed and agreed with the findings in the report. No results found.     Bone marrow biopsy on 10/14/2021:  DIAGNOSIS    A-C. Bone marrow, aspirate smear, touch prep, clot section, core biopsy and peripheral blood smear: - Plasma cell neoplasm, based on 3% atypical plasma cells on aspirate smear and MRD flow cytometry results with 0.2% abnormal lambda-monotypic plasma cells - Aspirate smear with erythroid predominance, no increase in blasts - Core biopsy and clot section limited for evaluation; touch prep unsatisfactory for evaluation - Congo Red stain negative for amyloid deposition - See comment  Electronically  signed by Valarie Merino, MD on 10/17/2021 at  6:35 PM  Diagnostic Comment    Corresponding flow cytometry analysis, [id number], shows a plasma cell population with aberrant immunophenotype comprising 0.2% of analyzed events, supporting the diagnosis above.     PET scan on 10/14/2021: IMPRESSION:   1.  No definite hypermetabolic disease.   2.  Indeterminate focus of hypermetabolic activity in the right proximal  arm musculature which is favored posttraumatic or related to spasm. This is  without CT correlate, but assessment is limited given lack of IV contrast.  Recommend attention on follow-up given the focal nature of this activity.    ASSESSMENT & PLAN:  LILIAS DUERR is a 57 y.o. female who presents for a follow up for continued management of multiple myeloma.   1. Lambda Light Chain Multiple Myeloma -Patient was last seen by [facility] team on 07/21/2022. Recommendations included proceed with monthly IVIG if IgG levels are <400.  -Labs today were reviewed with patient. WBC 3.2, Hgb 11.3, Plt 213, Creatinine and LFTs normal. IgG level 363, IgA 9, IgM 19.  --Proceed with monthly IVIG infusion that is scheduled for next week.   FOLLOW-UP: RTC with Dr Candise Che in 4 weeks with repeat labs.   All of the patient's questions were answered with apparent satisfaction. The patient knows to call the clinic with any problems, questions or concerns.  I have spent a total of 25 minutes  minutes of face-to-face and non-face-to-face time, preparing to see the patient, performing a medically appropriate examination, counseling and educating the patient, documenting clinical information in the electronic health record, independently interpreting results and communicating results to the patient, and care coordination.   Georga Kaufmann PA-C Dept of Hematology and Oncology [facility] Phone: [phone]

## 2022-08-27 DIAGNOSIS — M25512 Pain in left shoulder: Secondary | ICD-10-CM | POA: Diagnosis not present

## 2022-08-27 DIAGNOSIS — I10 Essential (primary) hypertension: Secondary | ICD-10-CM | POA: Diagnosis not present

## 2022-08-27 DIAGNOSIS — Z0001 Encounter for general adult medical examination with abnormal findings: Secondary | ICD-10-CM | POA: Diagnosis not present

## 2022-08-27 DIAGNOSIS — Z634 Disappearance and death of family member: Secondary | ICD-10-CM | POA: Diagnosis not present

## 2022-08-28 ENCOUNTER — Telehealth: Payer: Self-pay

## 2022-08-28 ENCOUNTER — Encounter: Payer: Self-pay | Admitting: Hematology

## 2022-08-28 NOTE — Telephone Encounter (Signed)
-----   Message from Briant Cedar sent at 08/28/2022  1:16 PM EDT ----- Please notify patient that IgG levels are less than 400 so we will proceed with IVIG infusion scheduled for next week. ----- Message ----- From: Interface, Lab In [facility] Sent: 08/26/2022  11:26 AM EDT To: Briant Cedar, PA-C

## 2022-08-28 NOTE — Telephone Encounter (Signed)
Pt advised of lab results and recommendations.  8/12 appt date and time confirmed with pt.

## 2022-09-01 ENCOUNTER — Other Ambulatory Visit: Payer: Self-pay

## 2022-09-01 ENCOUNTER — Inpatient Hospital Stay: Payer: Medicare Other

## 2022-09-01 VITALS — BP 124/78 | HR 59 | Temp 98.2°F | Resp 18 | Wt 184.2 lb

## 2022-09-01 DIAGNOSIS — Z95828 Presence of other vascular implants and grafts: Secondary | ICD-10-CM

## 2022-09-01 DIAGNOSIS — C7951 Secondary malignant neoplasm of bone: Secondary | ICD-10-CM

## 2022-09-01 DIAGNOSIS — D801 Nonfamilial hypogammaglobulinemia: Secondary | ICD-10-CM | POA: Diagnosis not present

## 2022-09-01 DIAGNOSIS — C9000 Multiple myeloma not having achieved remission: Secondary | ICD-10-CM

## 2022-09-01 DIAGNOSIS — C9001 Multiple myeloma in remission: Secondary | ICD-10-CM | POA: Diagnosis not present

## 2022-09-01 DIAGNOSIS — Z7189 Other specified counseling: Secondary | ICD-10-CM

## 2022-09-01 DIAGNOSIS — T451X5A Adverse effect of antineoplastic and immunosuppressive drugs, initial encounter: Secondary | ICD-10-CM | POA: Diagnosis not present

## 2022-09-01 MED ORDER — CETIRIZINE HCL 10 MG/ML IV SOLN
10.0000 mg | Freq: Once | INTRAVENOUS | Status: AC
  Administered 2022-09-01: 10 mg via INTRAVENOUS
  Filled 2022-09-01: qty 1

## 2022-09-01 MED ORDER — DEXTROSE 5 % IV SOLN: Freq: Once | INTRAVENOUS | Status: AC

## 2022-09-01 MED ORDER — FAMOTIDINE IN NACL 20-0.9 MG/50ML-% IV SOLN
20.0000 mg | Freq: Once | INTRAVENOUS | Status: AC
  Administered 2022-09-01: 20 mg via INTRAVENOUS

## 2022-09-01 MED ORDER — ACETAMINOPHEN 325 MG PO TABS
650.0000 mg | ORAL_TABLET | Freq: Once | ORAL | Status: AC
  Administered 2022-09-01: 650 mg via ORAL
  Filled 2022-09-01: qty 2

## 2022-09-01 MED ORDER — IMMUNE GLOBULIN (HUMAN) 10 GM/100ML IV SOLN
400.0000 mg/kg | Freq: Once | INTRAVENOUS | Status: AC
  Administered 2022-09-01: 35 g via INTRAVENOUS
  Filled 2022-09-01: qty 350

## 2022-09-01 NOTE — Patient Instructions (Signed)

## 2022-09-02 DIAGNOSIS — M25512 Pain in left shoulder: Secondary | ICD-10-CM | POA: Diagnosis not present

## 2022-09-02 DIAGNOSIS — M25571 Pain in right ankle and joints of right foot: Secondary | ICD-10-CM | POA: Diagnosis not present

## 2022-09-10 ENCOUNTER — Ambulatory Visit (INDEPENDENT_AMBULATORY_CARE_PROVIDER_SITE_OTHER): Payer: Medicare Other

## 2022-09-10 ENCOUNTER — Encounter: Payer: Self-pay | Admitting: Podiatry

## 2022-09-10 ENCOUNTER — Ambulatory Visit: Payer: Medicare Other | Admitting: Podiatry

## 2022-09-10 DIAGNOSIS — M722 Plantar fascial fibromatosis: Secondary | ICD-10-CM | POA: Diagnosis not present

## 2022-09-10 DIAGNOSIS — M79671 Pain in right foot: Secondary | ICD-10-CM

## 2022-09-10 MED ORDER — TRIAMCINOLONE ACETONIDE 10 MG/ML IJ SUSP
10.0000 mg | Freq: Once | INTRAMUSCULAR | Status: AC
Start: 2022-09-10 — End: 2022-09-10
  Administered 2022-09-10: 10 mg via INTRA_ARTICULAR

## 2022-09-10 NOTE — Patient Instructions (Signed)

## 2022-09-11 NOTE — Progress Notes (Signed)
Subjective:   Patient ID: Alexandra [name], female   DOB: 57 y.o.   MRN: [id number]   HPI Patient presents stating having a lot of pain in her right heel and states has been worse when she gets up after periods of sitting and patient tries to be active history of multiple myeloma does not smoke   Review of Systems  All other systems reviewed and are negative.       Objective:  Physical Exam Vitals and nursing note reviewed.  Constitutional:      Appearance: She is well-developed.  Pulmonary:     Effort: Pulmonary effort is normal.  Musculoskeletal:        General: Normal range of motion.  Skin:    General: Skin is warm.  Neurological:     Mental Status: She is alert.     Neurovascular status intact muscle strength was found to be adequate range of motion adequate with patient found to have exquisite discomfort plantar aspect right heel at the insertional point tendon calcaneus inflammation fluid around the medial band.  Patient is noted to have moderate flatfoot deformity good digital perfusion well-oriented     Assessment:  Acute plantar fasciitis right history of having had an x-ray that I was able to review with fluid accumulation      Plan:  H&P reviewed condition at great length.  I went ahead today I did sterile prep I injected the fascia at insertion 3 mg Kenalog 5 mg Xylocaine I applied a fascial taping to elevate the arch I instructed on shoe gear modifications physical therapy and reviewed her x-rays indicating spur formation.  May require other treatments depending on response reappoint 2 weeks

## 2022-09-17 ENCOUNTER — Encounter: Payer: Medicare Other | Admitting: Nurse Practitioner

## 2022-09-17 DIAGNOSIS — M25512 Pain in left shoulder: Secondary | ICD-10-CM | POA: Diagnosis not present

## 2022-09-24 ENCOUNTER — Ambulatory Visit: Payer: Medicare Other | Admitting: Podiatry

## 2022-09-24 ENCOUNTER — Encounter: Payer: Self-pay | Admitting: Podiatry

## 2022-09-24 DIAGNOSIS — M722 Plantar fascial fibromatosis: Secondary | ICD-10-CM

## 2022-09-25 ENCOUNTER — Telehealth: Payer: Self-pay | Admitting: Radiation Oncology

## 2022-09-25 NOTE — Progress Notes (Signed)
Subjective:   Patient ID: Alexandra [name], female   DOB: 57 y.o.   MRN: [id number]   HPI Patient states feeling quite a bit better and her heel pain still present but mild   ROS      Objective:  Physical Exam  Neurovascular status intact improvement of discomfort right plantar fascia at the insertional point tendon calcaneus     Assessment:  Acute plantar fasciitis right improved with conservative care     Plan:  H&P reviewed recommended stretching and continued shoe gear modifications and will be seen back as symptoms indicate

## 2022-09-25 NOTE — Telephone Encounter (Signed)
Pt called requesting repeat MRI. She states she is having cramping to L hand and twitching to L eye and L arm x 2 weeks. Pt was advised message would be passed to Dr. Broadus John team to discuss this. Pt verbalized understanding.

## 2022-09-26 ENCOUNTER — Ambulatory Visit (HOSPITAL_COMMUNITY)
Admission: RE | Admit: 2022-09-26 | Discharge: 2022-09-26 | Disposition: A | Discharge status: Home / Self Care | Payer: Medicare Other | Source: Ambulatory Visit | Attending: Radiation Oncology | Admitting: Radiation Oncology

## 2022-09-26 ENCOUNTER — Other Ambulatory Visit: Payer: Self-pay | Admitting: Radiation Therapy

## 2022-09-26 DIAGNOSIS — C9030 Solitary plasmacytoma not having achieved remission: Secondary | ICD-10-CM | POA: Insufficient documentation

## 2022-09-26 DIAGNOSIS — G9389 Other specified disorders of brain: Secondary | ICD-10-CM | POA: Diagnosis not present

## 2022-09-26 DIAGNOSIS — M25512 Pain in left shoulder: Secondary | ICD-10-CM | POA: Diagnosis not present

## 2022-09-26 MED ORDER — GADOBUTROL 1 MMOL/ML IV SOLN
7.0000 mL | Freq: Once | INTRAVENOUS | Status: AC | PRN
  Administered 2022-09-26: 7 mL via INTRAVENOUS

## 2022-09-29 ENCOUNTER — Ambulatory Visit
Admission: RE | Admit: 2022-09-29 | Discharge: 2022-09-29 | Disposition: A | Discharge status: Home / Self Care | Payer: Medicare Other | Source: Ambulatory Visit | Attending: Radiation Oncology | Admitting: Radiation Oncology

## 2022-09-29 ENCOUNTER — Inpatient Hospital Stay: Payer: Medicare Other | Attending: Hematology

## 2022-09-29 VITALS — BP 120/76 | HR 62 | Temp 98.0°F | Resp 17

## 2022-09-29 DIAGNOSIS — C9000 Multiple myeloma not having achieved remission: Secondary | ICD-10-CM | POA: Diagnosis not present

## 2022-09-29 DIAGNOSIS — Z7189 Other specified counseling: Secondary | ICD-10-CM

## 2022-09-29 DIAGNOSIS — C9002 Multiple myeloma in relapse: Secondary | ICD-10-CM | POA: Diagnosis not present

## 2022-09-29 DIAGNOSIS — C7951 Secondary malignant neoplasm of bone: Secondary | ICD-10-CM

## 2022-09-29 DIAGNOSIS — Z95828 Presence of other vascular implants and grafts: Secondary | ICD-10-CM

## 2022-09-29 MED ORDER — IMMUNE GLOBULIN (HUMAN) 10 GM/100ML IV SOLN
35.0000 g | Freq: Once | INTRAVENOUS | Status: AC
  Administered 2022-09-29: 35 g via INTRAVENOUS
  Filled 2022-09-29: qty 350

## 2022-09-29 MED ORDER — CETIRIZINE HCL 10 MG/ML IV SOLN
10.0000 mg | Freq: Once | INTRAVENOUS | Status: AC
  Administered 2022-09-29: 10 mg via INTRAVENOUS
  Filled 2022-09-29: qty 1

## 2022-09-29 MED ORDER — FAMOTIDINE IN NACL 20-0.9 MG/50ML-% IV SOLN
20.0000 mg | Freq: Once | INTRAVENOUS | Status: AC
  Administered 2022-09-29: 20 mg via INTRAVENOUS
  Filled 2022-09-29: qty 50

## 2022-09-29 MED ORDER — ACETAMINOPHEN 325 MG PO TABS
650.0000 mg | ORAL_TABLET | Freq: Once | ORAL | Status: AC
  Administered 2022-09-29: 650 mg via ORAL
  Filled 2022-09-29: qty 2

## 2022-09-29 NOTE — Progress Notes (Signed)
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   Interval Since Last Radiation:  1 year and 4 months  06/03/21 - 06/14/21:   The recurrent tumor nodule in the left frontotemporal skull was treated to 30 Gy in 10 fractions and the cranioplasty surgical bed was simultaneously treated to 20 Gy in 10 fractions.   Narrative:  I contacted the patient to review the results of her recent MRI brain scan via telephone to spare the patient unnecessary potential exposure in the healthcare setting during the current COVID-19 pandemic.  The patient was notified in advance and gave permission to proceed with this visit format.                             Patient called our office on 09/25/2022 requesting a repeat MRI. She was having cramping to her left hand and twitching to her left eye for 2 weeks. She states she has recently tried a new diet that may have caused her to be dehydrated. Since drinking more water she noticed her eye twitching has improved.   MRI of the brain on 09/26/2022 showed postoperative changes of bifrontal craniotomy and cranioplasty; subjacent small fluid collection is unchanged; mild overlying dural thickening and enhancement is also unchanged; no new suspicious/nodular enhancement; similar focus of subjacent gliosis/encephalomalacia in the anterior left frontal lobe.   ALLERGIES:  has No Known Allergies.  Meds: Current Outpatient Medications  Medication Sig Dispense Refill   atorvastatin (LIPITOR) 10 MG tablet Take 1 tablet (10 mg total) by mouth daily. 90 tablet 1   benazepril-hydrochlorthiazide (LOTENSIN HCT) 5-6.25 MG tablet Take 1 tablet by mouth in the morning. 90 tablet 1   bimatoprost (LATISSE) 0.03 %  ophthalmic solution Place one drop on applicator and apply evenly at the base of eyelashes nightly. Repeat procedure for the other eye. (Patient not taking: Reported on 08/26/2022)     Bioflavonoid Products (BIOFLEX PO) Take by mouth.     Cholecalciferol (VITAMIN D-3 PO) Take by mouth daily.     ECHINACEA PO Take by mouth.     GLUCOSAMINE-CHONDROITIN PO Take by mouth daily. Two tablets daily     levocetirizine (XYZAL) 5 MG tablet Take 1 tablet (5 mg total) by mouth every evening. 90 tablet 3   meloxicam (MOBIC) 15 MG tablet Take 1 tablet (15 mg total) by mouth daily. 30 tablet 11   Multiple Vitamins-Minerals (EMERGEN-C IMMUNE PO) Take by mouth daily. Two tablets     OZEMPIC, 1 MG/DOSE, 4 MG/3ML SOPN Inject 1 mg into the skin once a week on the same day each week. 9 mL 1   traMADol (ULTRAM) 50 MG tablet TAKE 1 TABLET BY MOUTH EVERY DAY AS NEEDED FOR PAIN     traZODone (DESYREL) 50 MG tablet Take 1 - 2 tablets (50 - 100 mg total) by mouth at bedtime as needed for sleep 180 tablet 1   valACYclovir (VALTREX) 500 MG tablet Take 1 tablet by mouth 2 times a day 60 tablet 10   No current facility-administered medications for this encounter.   Facility-Administered Medications Ordered in Other Encounters  Medication Dose Route Frequency Provider Last Rate Last Admin  heparin lock flush 100 unit/mL  500 Units Intracatheter Once Johney Maine, MD       sodium chloride flush (NS) 0.9 % injection 10 mL  10 mL Intracatheter Once Johney Maine, MD        Physical Findings:  vitals were not taken for this visit.   /10 Unable to assess due to telephone follow-up visit format.  Lab Findings: Lab Results  Component Value Date   WBC 3.2 (L) 08/26/2022   HGB 11.3 (L) 08/26/2022   HCT 33.3 (L) 08/26/2022   MCV 87.2 08/26/2022   PLT 213 08/26/2022     Radiographic Findings: MR Brain W Wo Contrast  Result Date: 09/26/2022 CLINICAL DATA:  Brain/CNS neoplasm, assess treatment response  Follow-up plasmacytoma.New symptoms-cramping to L hand and twitching to L eye and L arm x 2 weeks EXAM: MRI HEAD WITHOUT AND WITH CONTRAST TECHNIQUE: Multiplanar, multiecho pulse sequences of the brain and surrounding structures were obtained without and with intravenous contrast. CONTRAST:  7mL GADAVIST GADOBUTROL 1 MMOL/ML IV SOLN COMPARISON:  MRI head July 01, 2022. FINDINGS: Brain: Postoperative changes of bifrontal craniotomy and cranioplasty. Subjacent small fluid collection is unchanged. Mild overlying dural thickening and enhancement is also unchanged. No new suspicious/nodular enhancement. Similar focus of subjacent gliosis/encephalomalacia in the anterior left frontal lobe. No evidence of acute infarct, acute hemorrhage, mass lesion or midline shift. No hydrocephalus. Vascular: Major arterial flow voids are maintained. Skull and upper cervical spine: Normal marrow signal. Sinuses/Orbits: Negative. IMPRESSION: Stable exam since July 01, 2022. No new or progressive enhancement at the cranioplasty site. Electronically Signed   By: Feliberto Harts M.D.   On: 09/26/2022 17:32   DG Foot 2 Views Right  Result Date: 09/15/2022 Please see detailed radiograph report in office note.   Impression/Plan: 1. 57 yo woman with recurrent left frontal skull plasmacytoma; s/p surgical resection in 09/2019 with new left hand numbness and left eye twitching.   Patient presented with symptoms concerning for possible seizure-like activity. We discussed her case at tumor board this morning. Brain MRI showed stable findings from her last scan with no evidence of disease progression. The consensus was to place the patient on Keppra for possible seizure with a follow-up with Dr. Barbaraann Cao for a full seizure work-up. I reviewed this plan with her and she would like to wait until her evaluation with Dr. Barbaraann Cao on 10/06/2022 before starting Keppra. This is a very reasonable plan given the mild nature of her symptoms at this time.  ***  We also discussed the plan to continue to closely monitor with serial MRI brain scans every 6 months since she is now 1 year out from treatment with stable scans.  We will follow-up by telephone following each scan to review results and recommendations from the multidisciplinary brain conference.  She appears to have a good understanding of these recommendations and is comfortable and in agreement with the stated plan.  She was advised to call at anytime with any questions or concerns in the interim.  We personally spent 30 minutes in this encounter including chart review, reviewing radiological studies, telephone conversation with the patient, entering orders and completing documentation.    Joyice Faster, PA-C    Margaretmary Dys, MD  [facility]  Radiation Oncology Direct Dial: [phone]  Fax: [phone] [email or web address]  Skype  LinkedIn

## 2022-09-29 NOTE — Patient Instructions (Signed)

## 2022-10-02 ENCOUNTER — Other Ambulatory Visit (HOSPITAL_COMMUNITY): Payer: Self-pay

## 2022-10-02 ENCOUNTER — Inpatient Hospital Stay: Payer: Medicare Other | Admitting: Internal Medicine

## 2022-10-03 ENCOUNTER — Other Ambulatory Visit (HOSPITAL_COMMUNITY): Payer: Self-pay

## 2022-10-03 DIAGNOSIS — L910 Hypertrophic scar: Secondary | ICD-10-CM | POA: Diagnosis not present

## 2022-10-03 MED ORDER — VALACYCLOVIR HCL 500 MG PO TABS
500.0000 mg | ORAL_TABLET | Freq: Two times a day (BID) | ORAL | 10 refills | Status: AC
  Filled 2022-10-03: qty 60, 30d supply, fill #0
  Filled 2022-11-12: qty 60, 30d supply, fill #1
  Filled 2022-12-22: qty 60, 30d supply, fill #2
  Filled 2023-01-26: qty 60, 30d supply, fill #3
  Filled 2023-03-03: qty 60, 30d supply, fill #4
  Filled 2023-04-20: qty 60, 30d supply, fill #5
  Filled 2023-06-10: qty 60, 30d supply, fill #6
  Filled 2023-08-17: qty 60, 30d supply, fill #7

## 2022-10-04 ENCOUNTER — Other Ambulatory Visit (HOSPITAL_COMMUNITY): Payer: Self-pay

## 2022-10-06 ENCOUNTER — Other Ambulatory Visit (HOSPITAL_COMMUNITY): Payer: Self-pay

## 2022-10-06 ENCOUNTER — Inpatient Hospital Stay (HOSPITAL_BASED_OUTPATIENT_CLINIC_OR_DEPARTMENT_OTHER): Payer: Medicare Other | Admitting: Internal Medicine

## 2022-10-06 ENCOUNTER — Other Ambulatory Visit: Payer: Self-pay

## 2022-10-06 VITALS — BP 121/86 | HR 85 | Temp 97.5°F | Resp 20 | Wt 182.6 lb

## 2022-10-06 DIAGNOSIS — C9000 Multiple myeloma not having achieved remission: Secondary | ICD-10-CM

## 2022-10-06 DIAGNOSIS — D496 Neoplasm of unspecified behavior of brain: Secondary | ICD-10-CM | POA: Diagnosis not present

## 2022-10-06 DIAGNOSIS — N62 Hypertrophy of breast: Secondary | ICD-10-CM | POA: Diagnosis not present

## 2022-10-06 MED ORDER — TRAMADOL HCL 50 MG PO TABS: 100.0000 mg | ORAL_TABLET | Freq: Four times a day (QID) | ORAL | 1 refills | Status: DC | PRN

## 2022-10-06 MED ORDER — ATORVASTATIN CALCIUM 10 MG PO TABS: 10.0000 mg | ORAL_TABLET | Freq: Every day | ORAL | 1 refills | Status: AC

## 2022-10-06 MED ORDER — BENAZEPRIL-HYDROCHLOROTHIAZIDE 5-6.25 MG PO TABS: 1.0000 | ORAL_TABLET | Freq: Every morning | ORAL | 1 refills | Status: AC

## 2022-10-06 MED ORDER — MELOXICAM 15 MG PO TABS: 15.0000 mg | ORAL_TABLET | Freq: Every day | ORAL | 11 refills | Status: DC

## 2022-10-06 MED ORDER — LEVOCETIRIZINE DIHYDROCHLORIDE 5 MG PO TABS: 5.0000 mg | ORAL_TABLET | Freq: Every evening | ORAL | 3 refills | Status: AC

## 2022-10-06 NOTE — Progress Notes (Signed)
[facility] [address]  [place], [place] [address] [phone]   New Patient Evaluation  Date of Service: 10/06/22 Patient Name: Alexandra [name] Patient MRN: [id number] Patient DOB: 03-12-1965 Provider: Henreitta Leber, MD  Identifying Statement:  Alexandra [name] is a 57 y.o. female with Brain tumor [facility]) who presents for initial consultation and evaluation regarding cancer associated neurologic deficits.    Referring Provider: [name], FNP [address] [place],  [place] [address]  Primary Cancer:  Oncologic History: Oncology History  Multiple myeloma (HCC)  11/28/2019 Initial Diagnosis   Multiple myeloma (HCC)   12/05/2019 - 06/13/2021 Chemotherapy   Patient is on Treatment Plan : MYELOMA NEWLY DIAGNOSED TRANSPLANT CANDIDATE Carfilzomib (20/36)/ Lenalidomide / Dexamethasone (40/20) (KRd) q28d     06/28/2021 - 09/04/2021 Chemotherapy   Patient is on Treatment Plan : MYELOMA RELAPSED/REFRACTORY Isatuximab-irfc (Sarclisa) + Pomalidomide + Dexamethasone q28d (Isa-Pd)     06/28/2021 - 12/11/2021 Chemotherapy   Patient is on Treatment Plan : MYELOMA RELAPSED/REFRACTORY Isatuximab-irfc (Sarclisa) + Pomalidomide + Dexamethasone q28d (Isa-Pd)     Malignant neoplasm metastatic to bone (HCC)  11/28/2019 Initial Diagnosis   Bone metastasis (HCC)   12/05/2019 - 06/13/2021 Chemotherapy   Patient is on Treatment Plan : MYELOMA NEWLY DIAGNOSED TRANSPLANT CANDIDATE Carfilzomib (20/36)/ Lenalidomide / Dexamethasone (40/20) (KRd) q28d     06/28/2021 - 09/04/2021 Chemotherapy   Patient is on Treatment Plan : MYELOMA RELAPSED/REFRACTORY Isatuximab-irfc (Sarclisa) + Pomalidomide + Dexamethasone q28d (Isa-Pd)     06/28/2021 - 12/11/2021 Chemotherapy   Patient is on Treatment Plan : MYELOMA RELAPSED/REFRACTORY Isatuximab-irfc (Sarclisa) + Pomalidomide + Dexamethasone q28d (Isa-Pd)       History of Present Illness: The patient's records from  the referring physician were obtained and reviewed and the patient interviewed to confirm this HPI.  Alexandra [name] presents to review recent neurologic symptoms.  She describes 1-2 weeks history of cramping in her left hand.  It will typically affect her pinkie, never the whole hand, lasts for up to 1 minute, resolves on its own.  Also had 1 week history of twitching in left eye, eyelid, resolved last week without recurrence.  The two movement episodes are not connected in time.  No headaches, stroke like symptoms, balance issues.  Continues to follow with Dr. Candise Che for multiple myeloma, s/p CAR-T at [facility].  Medications: Current Outpatient Medications on File Prior to Visit  Medication Sig Dispense Refill   bimatoprost (LATISSE) 0.03 % ophthalmic solution Place one drop on applicator and apply evenly at the base of eyelashes nightly. Repeat procedure for the other eye. (Patient not taking: Reported on 08/26/2022)     Bioflavonoid Products (BIOFLEX PO) Take by mouth.     Cholecalciferol (VITAMIN D-3 PO) Take by mouth daily.     ECHINACEA PO Take by mouth.     GLUCOSAMINE-CHONDROITIN PO Take by mouth daily. Two tablets daily     Multiple Vitamins-Minerals (EMERGEN-C IMMUNE PO) Take by mouth daily. Two tablets     OZEMPIC, 1 MG/DOSE, 4 MG/3ML SOPN Inject 1 mg into the skin once a week on the same day each week. 9 mL 1   traZODone (DESYREL) 50 MG tablet Take 1 - 2 tablets (50 - 100 mg total) by mouth at bedtime as needed for sleep 180 tablet 1   valACYclovir (VALTREX) 500 MG tablet Take 1 tablet by mouth 2 times a day 60 tablet 10   [DISCONTINUED] QUEtiapine (SEROQUEL) 25 MG  tablet Take 0.5-1 tablets (12.5-25 mg total) by mouth at bedtime. 30 tablet 2   Current Facility-Administered Medications on File Prior to Visit  Medication Dose Route Frequency Provider Last Rate Last Admin   heparin lock flush 100 unit/mL  500 Units Intracatheter Once Johney Maine, MD       sodium chloride flush (NS) 0.9 %  injection 10 mL  10 mL Intracatheter Once Johney Maine, MD        Allergies: No Known Allergies Past Medical History:  Past Medical History:  Diagnosis Date   Anxiety    Hypertension    Pre-diabetes    Past Surgical History:  Past Surgical History:  Procedure Laterality Date   ABLATION     APPLICATION OF CRANIAL NAVIGATION N/A 10/10/2019   Procedure: APPLICATION OF CRANIAL NAVIGATION;  Surgeon: Lisbeth Renshaw, MD;  Location: [facility] OR;  Service: Neurosurgery;  Laterality: N/A;  APPLICATION OF CRANIAL NAVIGATION   CRANIOPLASTY Left 10/14/2019   Procedure: LEFT CRANIOPLASTY WITH PLACEMENT OF CUSTOM CRANIAL IMPLANT;  Surgeon: Lisbeth Renshaw, MD;  Location: [facility] OR;  Service: Neurosurgery;  Laterality: Left;   CRANIOTOMY Left 10/10/2019   Procedure: Left frontal Stereotactic craniectomy for resection of tumor;  Surgeon: Lisbeth Renshaw, MD;  Location: [facility] OR;  Service: Neurosurgery;  Laterality: Left;  Left frontal Stereotactic craniectomy for resection of tumor   HAMMER TOE SURGERY Left    IR IMAGING GUIDED PORT INSERTION  12/01/2019   IR IMAGING GUIDED PORT INSERTION  08/06/2021   TUBAL LIGATION     Social History:  Social History   Socioeconomic History   Marital status: Single    Spouse name: Not on file   Number of children: Not on file   Years of education: Not on file   Highest education level: Associate degree: academic program  Occupational History   Not on file  Tobacco Use   Smoking status: Never   Smokeless tobacco: Never  Vaping Use   Vaping status: Never Used  Substance and Sexual Activity   Alcohol use: Yes    Comment: occassionally   Drug use: Not Currently   Sexual activity: Not on file  Other Topics Concern   Not on file  Social History Narrative   Not on file   Social Determinants of Health   Financial Resource Strain: Medium Risk (05/15/2022)   Overall Financial Resource Strain (CARDIA)    Difficulty of Paying Living Expenses: Somewhat  hard  Food Insecurity: Food Insecurity Present (05/15/2022)   Hunger Vital Sign    Worried About Running Out of Food in the Last Year: Sometimes true    Ran Out of Food in the Last Year: Never true  Transportation Needs: No Transportation Needs (05/15/2022)   PRAPARE - Administrator, Civil Service (Medical): No    Lack of Transportation (Non-Medical): No  Physical Activity: Insufficiently Active (05/15/2022)   Exercise Vital Sign    Days of Exercise per Week: 1 day    Minutes of Exercise per Session: 30 min  Stress: Stress Concern Present (05/15/2022)   [employer] of Occupational Health - Occupational Stress Questionnaire    Feeling of Stress : To some extent  Social Connections: Moderately Isolated (05/15/2022)   Social Connection and Isolation Panel [NHANES]    Frequency of Communication with Friends and Family: Once a week    Frequency of Social Gatherings with Friends and Family: Once a week    Attends Religious Services: More than 4 times per year  Active Member of Clubs or Organizations: Yes    Attends Engineer, structural: More than 4 times per year    Marital Status: Divorced  Catering manager Violence: Not on file   Family History:  Family History  Problem Relation Age of Onset   Breast cancer Maternal Grandmother    Heart disease Mother    Hypertension Mother    Sarcoidosis Father    Diabetes Maternal Grandfather     Review of Systems: Constitutional: Doesn't report fevers, chills or abnormal weight loss Eyes: Doesn't report blurriness of vision Ears, nose, mouth, throat, and face: Doesn't report sore throat Respiratory: Doesn't report cough, dyspnea or wheezes Cardiovascular: Doesn't report palpitation, chest discomfort  Gastrointestinal:  Doesn't report nausea, constipation, diarrhea GU: Doesn't report incontinence Skin: Doesn't report skin rashes Neurological: Per HPI Musculoskeletal: Doesn't report joint pain Behavioral/Psych:  Doesn't report anxiety  Physical Exam: Vitals:   10/06/22 1528  BP: 121/86  Pulse: 85  Resp: 20  Temp: (!) 97.5 F (36.4 C)  SpO2: 100%   KPS: 90. General: Alert, cooperative, pleasant, in no acute distress Head: Normal EENT: No conjunctival injection or scleral icterus.  Lungs: Resp effort normal Cardiac: Regular rate Abdomen: Non-distended abdomen Skin: No rashes cyanosis or petechiae. Extremities: No clubbing or edema  Neurologic Exam: Mental Status: Awake, alert, attentive to examiner. Oriented to self and environment. Language is fluent with intact comprehension.  Cranial Nerves: Visual acuity is grossly normal. Visual fields are full. Extra-ocular movements intact. No ptosis. Face is symmetric Motor: Tone and bulk are normal. Power is full in both arms and legs. Reflexes are symmetric, no pathologic reflexes present.  Sensory: Intact to light touch Gait: Normal.   Labs: I have reviewed the data as listed    Component Value Date/Time   NA 141 08/26/2022 1112   NA 143 11/01/2020 0936   K 4.1 08/26/2022 1112   CL 106 08/26/2022 1112   CO2 29 08/26/2022 1112   GLUCOSE 93 08/26/2022 1112   BUN 14 08/26/2022 1112   BUN 14 11/01/2020 0936   CREATININE 0.91 08/26/2022 1112   CALCIUM 9.2 08/26/2022 1112   PROT 6.4 (L) 08/26/2022 1112   PROT 6.7 11/01/2020 0936   ALBUMIN 4.3 08/26/2022 1112   ALBUMIN 4.9 11/01/2020 0936   AST 18 08/26/2022 1112   ALT 16 08/26/2022 1112   ALKPHOS 54 08/26/2022 1112   BILITOT 0.7 08/26/2022 1112   GFRNONAA >60 08/26/2022 1112   GFRAA >60 10/11/2019 0500   Lab Results  Component Value Date   WBC 3.2 (L) 08/26/2022   NEUTROABS 1.9 08/26/2022   HGB 11.3 (L) 08/26/2022   HCT 33.3 (L) 08/26/2022   MCV 87.2 08/26/2022   PLT 213 08/26/2022    Imaging:  MR Brain W Wo Contrast  Result Date: 09/26/2022 CLINICAL DATA:  Brain/CNS neoplasm, assess treatment response Follow-up plasmacytoma.New symptoms-cramping to L hand and twitching  to L eye and L arm x 2 weeks EXAM: MRI HEAD WITHOUT AND WITH CONTRAST TECHNIQUE: Multiplanar, multiecho pulse sequences of the brain and surrounding structures were obtained without and with intravenous contrast. CONTRAST:  7mL GADAVIST GADOBUTROL 1 MMOL/ML IV SOLN COMPARISON:  MRI head July 01, 2022. FINDINGS: Brain: Postoperative changes of bifrontal craniotomy and cranioplasty. Subjacent small fluid collection is unchanged. Mild overlying dural thickening and enhancement is also unchanged. No new suspicious/nodular enhancement. Similar focus of subjacent gliosis/encephalomalacia in the anterior left frontal lobe. No evidence of acute infarct, acute hemorrhage, mass lesion or midline shift.  No hydrocephalus. Vascular: Major arterial flow voids are maintained. Skull and upper cervical spine: Normal marrow signal. Sinuses/Orbits: Negative. IMPRESSION: Stable exam since July 01, 2022. No new or progressive enhancement at the cranioplasty site. Electronically Signed   By: Feliberto Harts M.D.   On: 09/26/2022 17:32   DG Foot 2 Views Right  Result Date: 09/15/2022 Please see detailed radiograph report in office note.    Assessment/Plan Brain tumor (HCC)  Alexandra [name] presents with clinical syndrome which does not localize to the brain, but rather to peripheral nerve/plexus on the left.  The eyelid twitching was likely fasciculation.  No suspicion for epilepsy at this time.  Ok with PRN muscle rexant use prescribed by orthopedics.  We spent twenty additional minutes teaching regarding the natural history, biology, and historical experience in the treatment of neurologic complications of cancer.   We appreciate the opportunity to participate in the care of Alexandra [name].  She may RTC as needed.  All questions were answered. The patient knows to call the clinic with any problems, questions or concerns. No barriers to learning were detected.  The total time spent in the encounter was 40 minutes  and more than 50% was on counseling and review of test results   Henreitta Leber, MD Medical Director of Neuro-Oncology [facility] [place] 10/06/22 3:34 PM

## 2022-10-07 DIAGNOSIS — Z1159 Encounter for screening for other viral diseases: Secondary | ICD-10-CM | POA: Diagnosis not present

## 2022-10-09 ENCOUNTER — Other Ambulatory Visit (HOSPITAL_COMMUNITY): Payer: Self-pay

## 2022-10-10 ENCOUNTER — Other Ambulatory Visit: Payer: Self-pay | Admitting: Radiation Therapy

## 2022-10-10 DIAGNOSIS — C9030 Solitary plasmacytoma not having achieved remission: Secondary | ICD-10-CM

## 2022-10-13 DIAGNOSIS — D801 Nonfamilial hypogammaglobulinemia: Secondary | ICD-10-CM | POA: Diagnosis not present

## 2022-10-13 DIAGNOSIS — C9000 Multiple myeloma not having achieved remission: Secondary | ICD-10-CM | POA: Diagnosis not present

## 2022-10-13 DIAGNOSIS — C9001 Multiple myeloma in remission: Secondary | ICD-10-CM | POA: Diagnosis not present

## 2022-10-16 ENCOUNTER — Telehealth: Payer: Self-pay | Admitting: Radiation Therapy

## 2022-10-16 NOTE — Telephone Encounter (Signed)
I spoke with pt about her upcoming brain MRI and telephone follow-up with Ellie in March 2025. She was seen by Dr. Barbaraann Cao for evaluation of possible seizures, but he did not feel the symptoms she experienced what caused by anything brain related, but from a previous shoulder injury. Based on the patient's understanding she will continue follow-up with Dr. Broadus John team.   Alexandra [name] R.T.(R)(T) Radiation Special Procedures Navigator

## 2022-10-27 ENCOUNTER — Inpatient Hospital Stay: Payer: Medicare Other | Attending: Hematology

## 2022-10-27 ENCOUNTER — Other Ambulatory Visit: Payer: Self-pay | Admitting: Physician Assistant

## 2022-10-27 VITALS — BP 129/81 | HR 61 | Temp 98.2°F | Resp 17 | Wt 177.5 lb

## 2022-10-27 DIAGNOSIS — C9000 Multiple myeloma not having achieved remission: Secondary | ICD-10-CM | POA: Insufficient documentation

## 2022-10-27 DIAGNOSIS — C9001 Multiple myeloma in remission: Secondary | ICD-10-CM | POA: Diagnosis not present

## 2022-10-27 DIAGNOSIS — Z7189 Other specified counseling: Secondary | ICD-10-CM

## 2022-10-27 DIAGNOSIS — Z79899 Other long term (current) drug therapy: Secondary | ICD-10-CM | POA: Diagnosis not present

## 2022-10-27 DIAGNOSIS — C7951 Secondary malignant neoplasm of bone: Secondary | ICD-10-CM

## 2022-10-27 DIAGNOSIS — Z95828 Presence of other vascular implants and grafts: Secondary | ICD-10-CM

## 2022-10-27 MED ORDER — ACETAMINOPHEN 325 MG PO TABS
650.0000 mg | ORAL_TABLET | Freq: Once | ORAL | Status: AC
  Administered 2022-10-27: 650 mg via ORAL
  Filled 2022-10-27: qty 2

## 2022-10-27 MED ORDER — CETIRIZINE HCL 10 MG/ML IV SOLN
10.0000 mg | Freq: Once | INTRAVENOUS | Status: AC
  Administered 2022-10-27: 10 mg via INTRAVENOUS
  Filled 2022-10-27: qty 1

## 2022-10-27 MED ORDER — FAMOTIDINE IN NACL 20-0.9 MG/50ML-% IV SOLN
20.0000 mg | Freq: Once | INTRAVENOUS | Status: AC
  Administered 2022-10-27: 20 mg via INTRAVENOUS
  Filled 2022-10-27: qty 50

## 2022-10-27 MED ORDER — DEXTROSE 5 % IV SOLN: INTRAVENOUS | Status: DC

## 2022-10-27 MED ORDER — IMMUNE GLOBULIN (HUMAN) 10 GM/100ML IV SOLN
400.0000 mg/kg | Freq: Once | INTRAVENOUS | Status: AC
  Administered 2022-10-27: 30 g via INTRAVENOUS
  Filled 2022-10-27: qty 300

## 2022-10-27 NOTE — Progress Notes (Signed)
Patient declined 30 min post IVIG observation.  Tolerated treatment well without incident.  VSS at discharge.  Ambulated to lobby.

## 2022-10-27 NOTE — Patient Instructions (Signed)

## 2022-10-28 ENCOUNTER — Inpatient Hospital Stay: Payer: Medicare Other | Admitting: Hematology

## 2022-10-28 ENCOUNTER — Inpatient Hospital Stay: Payer: Medicare Other

## 2022-10-28 VITALS — BP 122/86 | HR 70 | Temp 97.5°F | Resp 20 | Wt 180.6 lb

## 2022-10-28 DIAGNOSIS — C9000 Multiple myeloma not having achieved remission: Secondary | ICD-10-CM

## 2022-10-28 DIAGNOSIS — D801 Nonfamilial hypogammaglobulinemia: Secondary | ICD-10-CM

## 2022-10-28 DIAGNOSIS — Z79899 Other long term (current) drug therapy: Secondary | ICD-10-CM | POA: Diagnosis not present

## 2022-10-28 LAB — CMP (CANCER CENTER ONLY)
ALT: 13 U/L (ref 0–44)
AST: 17 U/L (ref 15–41)
Albumin: 4.2 g/dL (ref 3.5–5.0)
Alkaline Phosphatase: 47 U/L (ref 38–126)
Anion gap: 6 (ref 5–15)
BUN: 12 mg/dL (ref 6–20)
CO2: 28 mmol/L (ref 22–32)
Calcium: 9.5 mg/dL (ref 8.9–10.3)
Chloride: 104 mmol/L (ref 98–111)
Creatinine: 0.83 mg/dL (ref 0.44–1.00)
GFR, Estimated: >60 mL/min (ref >60)
Glucose, Bld: 82 mg/dL (ref 70–99)
Potassium: 3.9 mmol/L (ref 3.5–5.1)
Sodium: 138 mmol/L (ref 135–145)
Total Bilirubin: 0.5 mg/dL (ref 0.3–1.2)
Total Protein: 7.6 g/dL (ref 6.5–8.1)

## 2022-10-28 LAB — CBC WITH DIFFERENTIAL (CANCER CENTER ONLY)
Abs Immature Granulocytes: 0 10*3/uL (ref 0.00–0.07)
Basophils Absolute: 0 10*3/uL (ref 0.0–0.1)
Basophils Relative: 1 %
Eosinophils Absolute: 0 10*3/uL (ref 0.0–0.5)
Eosinophils Relative: 1 %
HCT: 37.2 % (ref 36.0–46.0)
Hemoglobin: 12.5 g/dL (ref 12.0–15.0)
Immature Granulocytes: 0 %
Lymphocytes Relative: 34 %
Lymphs Abs: 1 10*3/uL (ref 0.7–4.0)
MCH: 29.9 pg (ref 26.0–34.0)
MCHC: 33.6 g/dL (ref 30.0–36.0)
MCV: 89 fL (ref 80.0–100.0)
Monocytes Absolute: 0.3 10*3/uL (ref 0.1–1.0)
Monocytes Relative: 11 %
Neutro Abs: 1.5 10*3/uL — ABNORMAL LOW (ref 1.7–7.7)
Neutrophils Relative %: 53 %
Platelet Count: 223 10*3/uL (ref 150–400)
RBC: 4.18 MIL/uL (ref 3.87–5.11)
RDW: 15.9 % — ABNORMAL HIGH (ref 11.5–15.5)
WBC Count: 2.8 10*3/uL — ABNORMAL LOW (ref 4.0–10.5)
nRBC: 0 % (ref 0.0–0.2)

## 2022-10-28 LAB — MAGNESIUM: Magnesium: 2.1 mg/dL (ref 1.7–2.4)

## 2022-10-28 LAB — LACTATE DEHYDROGENASE: LDH: 153 U/L (ref 98–192)

## 2022-10-28 NOTE — Progress Notes (Signed)
HEMATOLOGY/ONCOLOGY CLNIC NOTE  Date of Service: 10/28/22   Patient Care Team: [name], FNP as PCP - General (General Practice)  CHIEF COMPLAINTS/PURPOSE OF CONSULTATION:  Follow-up for continued evaluation and management of multiple myeloma.  HISTORY OF PRESENTING ILLNESS:   Please see previous note for details  INTERVAL HISTORY: Alexandra [name] is here for continued evaluation and management of her multiple myeloma. She received CAR-T therapy around 6 months ago.   Patient was last seen by me on 06/02/2022 and complained of shoulder pain due to arthritis, joint pain, bilateral knee pain R>L, and inability to bend right knee.   She was seen by [facility] transplant team on 07/21/2022 and reported ongoing shoulder stiffness, right knee stiffness, some plantar fasciitis symptoms in her feet, and occasional stiffness in hands. She was planned to continue IVIG for IgG less than 400.  She was seen by [facility] on 08/26/2022 and complained of left shoulder pain related to arthritis and her knee pain had improved.   She was seen by Dr. Barbaraann Cao on 10/06/2022 and complained of left hand cramping for 1-2 weeks, usually involving her pinkie. She also complained of left eye/eyelid twitching. Her eyelid twitching was thought to be fasciculation.  Today, she reports that she has been feeling well overall since her last clinical visit. Patient complains of shoulder pain and bursa in her heel. She denies any infection issues or abdominal pain. Patient last received IVIG yesterday.   Patient reports having earlobe repair surgery yesterday. She is planning on receiving breast reduction surgery on December 02, 2022 due to shoulder and back discomfort. She is scheduled to have rotator cuff surgery on December 22, 2022 due to nerve tear. Her previous cramping symptoms were thought to be related to a nerve issue in her shoulder.   Patient continues to be on her blood pressure medication, Xyzal, and Seroquel. She is  not on Ozempic or Meloxicam at this time. She uses Trazodone as needed and notes that she generally does not take any pain medications.   MRI in September showed stable findings. Patient has no other infection issues. She has recently received the flu and COVID-19 vaccines. She is currently working part-time. Patient's next scheduled appointment with [facility] oncology will be in January.   ONCOLOGIC HISTORY   Lambda Light Chain Multiple Myeloma  A. 09/27/19: CT Maxillofacial enhancing dural-based anterior cranial fossa mass traversing the calvarium and extending into the extracalvarial soft tissues along with a 7.4 mm rightward midline shift at the level of anterior cranial fossa. MRI head with and without contrast is recommended for further evaluation. B. 10/05/19: MRI Head large extra-axial mass anterior left frontal region with bone erosion and extension into the scalp soft tissues. Considerations include hemangiopericytoma, metastasis, and aggressive hemangioma. 2.6 mm enhancing lesion in the clivus.  C. 10/10/19: Left craniectomy with frontal brain tumor/bone resection, path + plasma cell neoplasm, lambda restricted, plasmacytoma favored. Hgb 9.9,  D. 10/31/19: Initial Hem-Onc consultation; M-spike 0; IgG 627, IgQ 51, IgM 11; SFLC: Kappa 1.12 mg/dL, Lambda 161.09, ratio 6.04; IFE: serum + monoclonal free Lambda light chain; Hgb 10.1,  E. 11/04/19: 24 hour UPEP Total protein 1318, Free lambda light chains Ur 1332.46, ratio 0.02, M-spike 27.4, M-spike 24 hr at 361; Bence Jones positive, Lambda type F. 11/08/19: BMBx 22% atypical plasma cells, 30% by IHC; FISH Dup(1q) and Del(13q) detected G. 11/10/19: PET CT no metabolic activity within the skeleton to localize active myeloma; no lytic lesions identified; no abnormal activity at  the left craniectomy site; no evidence of soft tissue plasmacytoma. H. 12/05/19: Started KRD; SFLC: Kappa 1.42, Lambda 177.97, ratio 0.01; Hgb 11.0 I. 12//13/21: SFLC: Kappa  1.45, Lambda 8.92, ratio 0.16; Hgb 11.1 J. 02/06/20: SFLC: Kappa 1.10, Lambda 2.08, ratio 0.53; Hgb 11.3 K. 02/26/20: Cycle 4 of KRD; SFLC: Kappa 1.14, Lambda 1.90, ratio 0.60; Hgb 10.8 L. 04/02/20: SFLC: Kappa 1.05, Lambda 1.56, ratio 0.67; Hgb 11.2 M. 04/30/20: SFLC: Kappa 1.01, Lambda 1.40, ratio 0.72; Hgb 11.5  N. 05/05/20: MRI Brain no evidence of left frontal bone lesion recurrence; Multiple subcentimeter area of nodular enhancement in the calvarium are nonspecific but could related to myeloma given nonvisualization on prior. O. 05/11/20: PET CT no findings to suggest FDG avid lesions of multiple myeloma. P. 05/14/20: BMBx 1% plasma cells Q. 05/28/20: SFLC: Kappa 0.84, Lambda 1.16, ratio 0.72; Hgb 11.6  R. 06/11/20: EKG QT 408/QTc 424; PFT's: FEV1 110%, DLCO 58.8% predicted, 64.4% hgb corrected; ECHO: EF 55% est, 61% calc; GLS -20%; SFLC: Kappa 1.16, Lambda 1.40, ratio 0.83; Hgb 10.9-VGPR S. 07/06/20: SFLC: Kappa 1.16, Lambda 2.14, ratio 0.54; . Hgb 11.7, Plt 118 T. 6/20-7/12/22: Hospital admission for Melphalan 200 mg/m2 followed by reinfusion of 4.71 x 10^6/kg CD34+ autologous cells  MEDICAL HISTORY:  Past Medical History:  Diagnosis Date   Anxiety    Hypertension    Pre-diabetes     SURGICAL HISTORY: Past Surgical History:  Procedure Laterality Date   ABLATION     APPLICATION OF CRANIAL NAVIGATION N/A 10/10/2019   Procedure: APPLICATION OF CRANIAL NAVIGATION;  Surgeon: Lisbeth Renshaw, MD;  Location: [facility] OR;  Service: Neurosurgery;  Laterality: N/A;  APPLICATION OF CRANIAL NAVIGATION   CRANIOPLASTY Left 10/14/2019   Procedure: LEFT CRANIOPLASTY WITH PLACEMENT OF CUSTOM CRANIAL IMPLANT;  Surgeon: Lisbeth Renshaw, MD;  Location: [facility] OR;  Service: Neurosurgery;  Laterality: Left;   CRANIOTOMY Left 10/10/2019   Procedure: Left frontal Stereotactic craniectomy for resection of tumor;  Surgeon: Lisbeth Renshaw, MD;  Location: [facility] OR;  Service: Neurosurgery;  Laterality: Left;  Left frontal  Stereotactic craniectomy for resection of tumor   HAMMER TOE SURGERY Left    IR IMAGING GUIDED PORT INSERTION  12/01/2019   IR IMAGING GUIDED PORT INSERTION  08/06/2021   TUBAL LIGATION      SOCIAL HISTORY: Social History   Socioeconomic History   Marital status: Single    Spouse name: Not on file   Number of children: Not on file   Years of education: Not on file   Highest education level: Associate degree: academic program  Occupational History   Not on file  Tobacco Use   Smoking status: Never   Smokeless tobacco: Never  Vaping Use   Vaping status: Never Used  Substance and Sexual Activity   Alcohol use: Yes    Comment: occassionally   Drug use: Not Currently   Sexual activity: Not on file  Other Topics Concern   Not on file  Social History Narrative   Not on file   Social Determinants of Health   Financial Resource Strain: Medium Risk (05/15/2022)   Overall Financial Resource Strain (CARDIA)    Difficulty of Paying Living Expenses: Somewhat hard  Food Insecurity: Food Insecurity Present (05/15/2022)   Hunger Vital Sign    Worried About Running Out of Food in the Last Year: Sometimes true    Ran Out of Food in the Last Year: Never true  Transportation Needs: No Transportation Needs (05/15/2022)   PRAPARE - Transportation  Lack of Transportation (Medical): No    Lack of Transportation (Non-Medical): No  Physical Activity: Insufficiently Active (05/15/2022)   Exercise Vital Sign    Days of Exercise per Week: 1 day    Minutes of Exercise per Session: 30 min  Stress: Stress Concern Present (05/15/2022)   [employer] of Occupational Health - Occupational Stress Questionnaire    Feeling of Stress : To some extent  Social Connections: Moderately Isolated (05/15/2022)   Social Connection and Isolation Panel [NHANES]    Frequency of Communication with Friends and Family: Once a week    Frequency of Social Gatherings with Friends and Family: Once a week    Attends  Religious Services: More than 4 times per year    Active Member of [employer] or Organizations: Yes    Attends Engineer, structural: More than 4 times per year    Marital Status: Divorced  Catering manager Violence: Not on file    FAMILY HISTORY: Family History  Problem Relation Age of Onset   Breast cancer Maternal Grandmother    Heart disease Mother    Hypertension Mother    Sarcoidosis Father    Diabetes Maternal Grandfather     ALLERGIES:  has No Known Allergies.  MEDICATIONS:  Current Outpatient Medications  Medication Sig Dispense Refill   atorvastatin (LIPITOR) 10 MG tablet Take 1 tablet (10 mg total) by mouth daily. 90 tablet 1   benazepril-hydrochlorthiazide (LOTENSIN HCT) 5-6.25 MG tablet Take 1 tablet by mouth in the morning. 90 tablet 1   bimatoprost (LATISSE) 0.03 % ophthalmic solution Place one drop on applicator and apply evenly at the base of eyelashes nightly. Repeat procedure for the other eye. (Patient not taking: Reported on 08/26/2022)     Bioflavonoid Products (BIOFLEX PO) Take by mouth.     Cholecalciferol (VITAMIN D-3 PO) Take by mouth daily.     ECHINACEA PO Take by mouth.     GLUCOSAMINE-CHONDROITIN PO Take by mouth daily. Two tablets daily     levocetirizine (XYZAL) 5 MG tablet Take 1 tablet (5 mg total) by mouth every evening. 90 tablet 3   meloxicam (MOBIC) 15 MG tablet Take 1 tablet (15 mg total) by mouth daily. 30 tablet 11   Multiple Vitamins-Minerals (EMERGEN-C IMMUNE PO) Take by mouth daily. Two tablets     OZEMPIC, 1 MG/DOSE, 4 MG/3ML SOPN Inject 1 mg into the skin once a week on the same day each week. 9 mL 1   traMADol (ULTRAM) 50 MG tablet Take 2 tablets (100 mg total) by mouth every 6 (six) hours as needed. 30 tablet 1   traZODone (DESYREL) 50 MG tablet Take 1 - 2 tablets (50 - 100 mg total) by mouth at bedtime as needed for sleep 180 tablet 1   valACYclovir (VALTREX) 500 MG tablet Take 1 tablet by mouth 2 times a day 60 tablet 10   No  current facility-administered medications for this visit.   Facility-Administered Medications Ordered in Other Visits  Medication Dose Route Frequency Provider Last Rate Last Admin   heparin lock flush 100 unit/mL  500 Units Intracatheter Once Johney Maine, MD       sodium chloride flush (NS) 0.9 % injection 10 mL  10 mL Intracatheter Once Johney Maine, MD        REVIEW OF SYSTEMS:    10 Point review of Systems was done is negative except as noted above.   PHYSICAL EXAMINATION: ECOG PERFORMANCE STATUS: 1 - Symptomatic but completely  ambulatory .BP 122/86   Pulse 70   Temp (!) 97.5 F (36.4 C)   Resp 20   Wt 180 lb 9.6 oz (81.9 kg)   LMP 07/09/2015   BMI 29.15 kg/m    GENERAL:alert, in no acute distress and comfortable SKIN: no acute rashes, no significant lesions EYES: conjunctiva are pink and non-injected, sclera anicteric OROPHARYNX: MMM, no exudates, no oropharyngeal erythema or ulceration NECK: supple, no JVD LYMPH:  no palpable lymphadenopathy in the cervical, axillary or inguinal regions LUNGS: clear to auscultation b/l with normal respiratory effort HEART: regular rate & rhythm ABDOMEN:  normoactive bowel sounds , non tender, not distended. Extremity: no pedal edema PSYCH: alert & oriented x 3 with fluent speech NEURO: no focal motor/sensory deficits    LABORATORY DATA:  I have reviewed the data as listed     Latest Ref Rng & Units 10/28/2022    2:47 PM 08/26/2022   11:12 AM 06/02/2022   10:16 AM  CBC  WBC 4.0 - 10.5 K/uL 2.8  3.2  2.2   Hemoglobin 12.0 - 15.0 g/dL 13.0  86.5  78.4   Hematocrit 36.0 - 46.0 % 37.2  33.3  35.4   Platelets 150 - 400 K/uL 223  213  215   ANC 500     Latest Ref Rng & Units 08/26/2022   11:12 AM 06/02/2022   11:06 AM 03/31/2022   12:56 PM  CMP  Glucose 70 - 99 mg/dL 93  89  92   BUN 6 - 20 mg/dL 14  11  19    Creatinine 0.44 - 1.00 mg/dL 6.96  2.95  2.84   Sodium 135 - 145 mmol/L 141  143  140   Potassium 3.5 -  5.1 mmol/L 4.1  4.2  4.5   Chloride 98 - 111 mmol/L 106  106  107   CO2 22 - 32 mmol/L 29  28  27    Calcium 8.9 - 10.3 mg/dL 9.2  9.1  9.1   Total Protein 6.5 - 8.1 g/dL 6.4  6.1  5.6   Total Bilirubin 0.3 - 1.2 mg/dL 0.7  0.5  0.4   Alkaline Phos 38 - 126 U/L 54  43  32   AST 15 - 41 U/L 18  16  20    ALT 0 - 44 U/L 16  11  15      11/08/2019 FISH Plasma Cell Myeloma Prognostic Panel:    11/08/2019 Cytogenetics:    11/08/2019 Bone Marrow Report [id number]):   10/10/2019 Surgical Pathology [phone]):   05/14/2020 Surgical Pathology "-Normocellular bone marrow for age with trilineage hematopoiesis and 1%  plasma cells  -See comment   PERIPHERAL BLOOD:  -Normocytic-normochromic anemia  -Leukopenia   COMMENT:   The bone marrow is generally normocellular for age with trilineage  hematopoiesis but with predominance of erythroid precursors.  The  myeloid changes are not considered specific and likely related to  therapy.  In this background, the plasma cells represent 1% of all cells  associated with scattered small clusters and display slight lambda light  chain excess.  The findings are limited but most suggestive of minimal  residual plasma cell neoplasm.  Correlation with cytogenetic and FISH  studies is recommended. "  RADIOGRAPHIC STUDIES:  I have personally reviewed the radiological images as listed and agreed with the findings in the report. No results found.     Bone marrow biopsy on 10/14/2021:  DIAGNOSIS    A-C. Bone marrow, aspirate smear, touch prep,  clot section, core biopsy and peripheral blood smear: - Plasma cell neoplasm, based on 3% atypical plasma cells on aspirate smear and MRD flow cytometry results with 0.2% abnormal lambda-monotypic plasma cells - Aspirate smear with erythroid predominance, no increase in blasts - Core biopsy and clot section limited for evaluation; touch prep unsatisfactory for evaluation - Congo Red stain negative  for amyloid deposition - See comment  Electronically signed by Valarie Merino, MD on 10/17/2021 at  6:35 PM  Diagnostic Comment    Corresponding flow cytometry analysis, [id number], shows a plasma cell population with aberrant immunophenotype comprising 0.2% of analyzed events, supporting the diagnosis above.     PET scan on 10/14/2021: IMPRESSION:   1.  No definite hypermetabolic disease.   2.  Indeterminate focus of hypermetabolic activity in the right proximal  arm musculature which is favored posttraumatic or related to spasm. This is  without CT correlate, but assessment is limited given lack of IV contrast.  Recommend attention on follow-up given the focal nature of this activity.    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 No M spike. Lambda light chain myeloma + Anemia + bone lesion on skull-- resected No renal failure, hypercalcemia  BMBx- 22-30% lambda restricted plasma cell MolCy Dup (1q), del (13q)  Completed 7 cycles of KRD  The pt recently had a transplant at [facility]. DISEASE Lambda Light Chain Multiple Myeloma  - She will receive Melphalan 200 mg/m2 conditioning regimen, followed by autologous stem cell rescue (see details below).  TRANSPLANT: Preparative regimen:  - Melphalan 200 mg/m2 (355mg ) IV: 07/09/20  Stem Cells:  - Autologous stem cell reinfusion on 07/10/20. Patient received 4.71 x 10^6 CD34+/kg. - Her course was complicated by neutropenic fevers and Bcx 6/27 positive for E coli. She received IV cefepime from 6/27 until 6/30, which cefepime was switched to zosyn in the setting of new abdominal pain. CT abdomen/pelvis was performed that showed acute uncomplicated multifocal  diverticulitis involving the transverse and descending colon. She was treated with bowel rest, prn pain medication, and IV zosyn, which was continued until engraftment on 7/5 and was transitioned to ppx ceftriaxone. Unfortunately, with the transition to ceftriaxone she had recurrent fevers and as a result was restarted on zosyn. Zosyn was transitioned to oral cipro/flagyl on 7/10 with the plan to continue for a 7 day course.    Bone marrow biopsy done on 01/18/2021 showed normocellular bone marrow with no morphologic evidence of plasma cell neoplasm. 24-hour UPEP on 01/22/2021 showed unremarkable immunofixation pattern and no monoclonal protein. PET CT scan 01/16/2021-negative for FDG avid lesions of myeloma MRI brain 01/12/2021-showed no overt active myeloma lesions.  S/p CAR-T therapy - She received Flu/Cy lymph-depletion, followed by CAR-T ([place]ma) infusion.  TRANSPLANT: Preparative Regimen:  - Fludarabine (30 mg/m2) 60 mg IV on 12/2121 - 1223/23 (Day -5 thru Day -3). - Cytoxan (300 mg/m2) 600 mg IV on 01/09/22 - 01/11/22 (Day -5 thru Day -3).  Stem Cells:  - CAR-T cell reinfusion on 01/14/22    3) prediabetes 4) hypertension 5) anxiety 6) dyslipidemia 7) s/p ABnormal LFts -- azole toxicity vs CRS  PLAN:  -patient is 10 months out from receiving CAR T-cell therapy -patients labs from [facility] oncology in July were stable -Discussed  lab results on 10/28/22 in detail with patient. CBC showed WBC of 2.8K, hemoglobin of 12.5, and platelets of 223K. -WBCs sightly low at 2.8. She is not neutropenic at this time.  She is not on bactrim at this time as her CD4 count was more than 200. -CMP stable -continue IVIG for IgG less than 400-500 -Will re-check antibodies in 6 weeks, and if levels are still more than 400-500, we shall stop IVIG -not recommending any other treatments at this time besides monitoring -will reach out to navigator to confirm immunization requirements including potentially  tetanus vaccines -Patient continues to follow-up with her transplant team at [facility] and her next visit with them is in January. -answered all of patient's questions in detail  FOLLOW-UP: Plz cancel IVIG infusion appointment scheduled for 11/24/2022 Labs in 6 weeks PHone visit with Dr Candise Che in 8 weeks  The total time spent in the appointment was 30 minutes* .  All of the patient's questions were answered with apparent satisfaction. The patient knows to call the clinic with any problems, questions or concerns.   Wyvonnia Lora MD MS AAHIVMS [facility] Hematology/Oncology Physician [facility]  .*Total Encounter Time as defined by the Centers for Medicare and Medicaid Services includes, in addition to the face-to-face time of a patient visit (documented in the note above) non-face-to-face time: obtaining and reviewing outside history, ordering and reviewing medications, tests or procedures, care coordination (communications with other health care professionals or caregivers) and documentation in the medical record.   I,Mitra Faeizi,acting as a Neurosurgeon for Wyvonnia Lora, MD.,have documented all relevant documentation on the behalf of Wyvonnia Lora, MD,as directed by  Wyvonnia Lora, MD while in the presence of Wyvonnia Lora, MD.  .I have reviewed the above documentation for accuracy and completeness, and I agree with the above. Johney Maine MD

## 2022-10-29 DIAGNOSIS — R310 Gross hematuria: Secondary | ICD-10-CM | POA: Diagnosis not present

## 2022-10-30 ENCOUNTER — Ambulatory Visit: Payer: Medicare Other | Admitting: Podiatry

## 2022-10-30 DIAGNOSIS — M722 Plantar fascial fibromatosis: Secondary | ICD-10-CM

## 2022-10-30 MED ORDER — TRIAMCINOLONE ACETONIDE 10 MG/ML IJ SUSP
10.0000 mg | Freq: Once | INTRAMUSCULAR | Status: AC
Start: 2022-10-30 — End: 2022-10-30
  Administered 2022-10-30: 10 mg via INTRA_ARTICULAR

## 2022-10-31 NOTE — Progress Notes (Signed)
Subjective:   Patient ID: Alexandra [name], female   DOB: 57 y.o.   MRN: [id number]   HPI Patient presents stating having a lot of pain in the right heel stating that it is worsened recently and states it is worse when she gets up in the morning after periods of sitting   ROS      Objective:  Physical Exam  Acute pain on the right plantar fascia with a very tight plantar fascia with pain extending into the arch right with most of it though at the insertion     Assessment:  Acute plantar fasciitis right with a tight plantar fascial band     Plan:  H&P reviewed I did go ahead today did sterile prep reinjected the fascia 3 mg Kenalog 5 mg Xylocaine and I did dispense a prefab over-the-counter at night splint to properly fitted to her lower leg to stretch the plantar fascia and take all pressure off the arch and will do heat ice treatments with this.  Reappoint as symptoms indicate

## 2022-11-04 ENCOUNTER — Encounter: Payer: Self-pay | Admitting: Hematology

## 2022-11-11 DIAGNOSIS — M25512 Pain in left shoulder: Secondary | ICD-10-CM | POA: Diagnosis not present

## 2022-11-11 DIAGNOSIS — I10 Essential (primary) hypertension: Secondary | ICD-10-CM | POA: Diagnosis not present

## 2022-11-11 DIAGNOSIS — Z01818 Encounter for other preprocedural examination: Secondary | ICD-10-CM | POA: Diagnosis not present

## 2022-11-14 ENCOUNTER — Other Ambulatory Visit (HOSPITAL_COMMUNITY): Payer: Self-pay

## 2022-11-17 DIAGNOSIS — N62 Hypertrophy of breast: Secondary | ICD-10-CM | POA: Diagnosis not present

## 2022-11-24 ENCOUNTER — Ambulatory Visit: Payer: Medicare Other

## 2022-11-28 DIAGNOSIS — R310 Gross hematuria: Secondary | ICD-10-CM | POA: Diagnosis not present

## 2022-12-02 ENCOUNTER — Other Ambulatory Visit: Payer: Self-pay | Admitting: Plastic Surgery

## 2022-12-02 DIAGNOSIS — R21 Rash and other nonspecific skin eruption: Secondary | ICD-10-CM | POA: Diagnosis not present

## 2022-12-02 DIAGNOSIS — N62 Hypertrophy of breast: Secondary | ICD-10-CM | POA: Diagnosis not present

## 2022-12-02 DIAGNOSIS — M25512 Pain in left shoulder: Secondary | ICD-10-CM | POA: Diagnosis not present

## 2022-12-02 DIAGNOSIS — M25511 Pain in right shoulder: Secondary | ICD-10-CM | POA: Diagnosis not present

## 2022-12-04 LAB — SURGICAL PATHOLOGY

## 2022-12-12 ENCOUNTER — Other Ambulatory Visit: Payer: Self-pay

## 2022-12-12 DIAGNOSIS — C9000 Multiple myeloma not having achieved remission: Secondary | ICD-10-CM

## 2022-12-15 ENCOUNTER — Inpatient Hospital Stay: Payer: Medicare Other | Attending: Hematology

## 2022-12-15 DIAGNOSIS — C9000 Multiple myeloma not having achieved remission: Secondary | ICD-10-CM

## 2022-12-15 DIAGNOSIS — C9001 Multiple myeloma in remission: Secondary | ICD-10-CM | POA: Diagnosis not present

## 2022-12-15 LAB — CBC WITH DIFFERENTIAL (CANCER CENTER ONLY)
Abs Immature Granulocytes: 0.01 10*3/uL (ref 0.00–0.07)
Basophils Absolute: 0 10*3/uL (ref 0.0–0.1)
Basophils Relative: 1 %
Eosinophils Absolute: 0.1 10*3/uL (ref 0.0–0.5)
Eosinophils Relative: 1 %
HCT: 31.4 % — ABNORMAL LOW (ref 36.0–46.0)
Hemoglobin: 10.2 g/dL — ABNORMAL LOW (ref 12.0–15.0)
Immature Granulocytes: 0 %
Lymphocytes Relative: 32 %
Lymphs Abs: 1.3 10*3/uL (ref 0.7–4.0)
MCH: 29 pg (ref 26.0–34.0)
MCHC: 32.5 g/dL (ref 30.0–36.0)
MCV: 89.2 fL (ref 80.0–100.0)
Monocytes Absolute: 0.4 10*3/uL (ref 0.1–1.0)
Monocytes Relative: 11 %
Neutro Abs: 2.2 10*3/uL (ref 1.7–7.7)
Neutrophils Relative %: 55 %
Platelet Count: 248 10*3/uL (ref 150–400)
RBC: 3.52 MIL/uL — ABNORMAL LOW (ref 3.87–5.11)
RDW: 16.4 % — ABNORMAL HIGH (ref 11.5–15.5)
WBC Count: 3.9 10*3/uL — ABNORMAL LOW (ref 4.0–10.5)
nRBC: 0 % (ref 0.0–0.2)

## 2022-12-15 LAB — CMP (CANCER CENTER ONLY)
ALT: 14 U/L (ref 0–44)
AST: 16 U/L (ref 15–41)
Albumin: 3.7 g/dL (ref 3.5–5.0)
Alkaline Phosphatase: 63 U/L (ref 38–126)
Anion gap: 3 — ABNORMAL LOW (ref 5–15)
BUN: 13 mg/dL (ref 6–20)
CO2: 31 mmol/L (ref 22–32)
Calcium: 9.1 mg/dL (ref 8.9–10.3)
Chloride: 108 mmol/L (ref 98–111)
Creatinine: 0.92 mg/dL (ref 0.44–1.00)
GFR, Estimated: >60 mL/min (ref >60)
Glucose, Bld: 87 mg/dL (ref 70–99)
Potassium: 4.4 mmol/L (ref 3.5–5.1)
Sodium: 142 mmol/L (ref 135–145)
Total Bilirubin: 0.4 mg/dL (ref <1.2)
Total Protein: 6.2 g/dL — ABNORMAL LOW (ref 6.5–8.1)

## 2022-12-15 LAB — MAGNESIUM: Magnesium: 2.1 mg/dL (ref 1.7–2.4)

## 2022-12-15 LAB — LACTATE DEHYDROGENASE: LDH: 137 U/L (ref 98–192)

## 2022-12-23 ENCOUNTER — Other Ambulatory Visit (HOSPITAL_COMMUNITY): Payer: Self-pay

## 2022-12-23 DIAGNOSIS — Z411 Encounter for cosmetic surgery: Secondary | ICD-10-CM | POA: Diagnosis not present

## 2022-12-23 DIAGNOSIS — I10 Essential (primary) hypertension: Secondary | ICD-10-CM | POA: Diagnosis not present

## 2022-12-29 ENCOUNTER — Inpatient Hospital Stay: Payer: Medicare Other | Attending: Hematology | Admitting: Hematology

## 2022-12-29 DIAGNOSIS — C9000 Multiple myeloma not having achieved remission: Secondary | ICD-10-CM

## 2022-12-29 NOTE — Progress Notes (Signed)
HEMATOLOGY/ONCOLOGY TELE-MED VISIT NOTE  Date of Service: 12/29/22   Patient Care Team: [name], FNP as PCP - General (General Practice)  CHIEF COMPLAINTS/PURPOSE OF CONSULTATION:  Follow-up for continued evaluation and management of multiple myeloma.  HISTORY OF PRESENTING ILLNESS:   Please see previous note for details  INTERVAL HISTORY:  [name] is connected via phone for continued evaluation and management of her multiple myeloma.  .I connected with Alexandra [name] on 12/29/2022 at  3:30 PM EST by telephone visit and verified that I am speaking with the correct person using two identifiers.   Patient was last seen by me on 10/28/2022 and she complained of shoulder pain and bursa in her heel.  Patient notes she has been doing well overall since our last visit. She recently had breast-reduction surgery without any complications.   She denies any new infection issues, fever, chills, night sweats, SOB, chest pain, back pain, abdominal pain, or leg swelling. She does complain of shoulder pain. She notes she most likely needs shoulder surgery, but she is not planning to get the surgery right now.  Patient notes that she has heel spur on her right heel.   She regularly follows-up with her transplant team every 74-months.   Discussed lab results from 12/15/2022 with the patient.    I discussed the limitations, risks, security and privacy concerns of performing an evaluation and management service by telemedicine and the availability of in-person appointments. I also discussed with the patient that there may be a patient responsible charge related to this service. The patient expressed understanding and agreed to proceed.   Other persons participating in the visit and their role in the encounter: None   Patient's location: Home  Provider's location: [facility]   Chief Complaint: multiple myeloma.   ONCOLOGIC HISTORY   Lambda Light Chain Multiple Myeloma  A. 09/27/19: CT  Maxillofacial enhancing dural-based anterior cranial fossa mass traversing the calvarium and extending into the extracalvarial soft tissues along with a 7.4 mm rightward midline shift at the level of anterior cranial fossa. MRI head with and without contrast is recommended for further evaluation. B. 10/05/19: MRI Head large extra-axial mass anterior left frontal region with bone erosion and extension into the scalp soft tissues. Considerations include hemangiopericytoma, metastasis, and aggressive hemangioma. 2.6 mm enhancing lesion in the clivus.  C. 10/10/19: Left craniectomy with frontal brain tumor/bone resection, path + plasma cell neoplasm, lambda restricted, plasmacytoma favored. Hgb 9.9,  D. 10/31/19: Initial Hem-Onc consultation; M-spike 0; IgG 627, IgQ 51, IgM 11; SFLC: Kappa 1.12 mg/dL, Lambda 161.09, ratio 6.04; IFE: serum + monoclonal free Lambda light chain; Hgb 10.1,  E. 11/04/19: 24 hour UPEP Total protein 1318, Free lambda light chains Ur 1332.46, ratio 0.02, M-spike 27.4, M-spike 24 hr at 361; Bence Jones positive, Lambda type F. 11/08/19: BMBx 22% atypical plasma cells, 30% by IHC; FISH Dup(1q) and Del(13q) detected G. 11/10/19: PET CT no metabolic activity within the skeleton to localize active myeloma; no lytic lesions identified; no abnormal activity at the left craniectomy site; no evidence of soft tissue plasmacytoma. H. 12/05/19: Started KRD; SFLC: Kappa 1.42, Lambda 177.97, ratio 0.01; Hgb 11.0 I. 12//13/21: SFLC: Kappa 1.45, Lambda 8.92, ratio 0.16; Hgb 11.1 J. 02/06/20: SFLC: Kappa 1.10, Lambda 2.08, ratio 0.53; Hgb 11.3 K. 02/26/20: Cycle 4 of KRD; SFLC: Kappa 1.14, Lambda 1.90, ratio 0.60; Hgb 10.8 L. 04/02/20: SFLC: Kappa 1.05, Lambda 1.56, ratio 0.67; Hgb 11.2 M. 04/30/20: SFLC: Kappa 1.01, Lambda 1.40, ratio 0.72;  Hgb 11.5  N. 05/05/20: MRI Brain no evidence of left frontal bone lesion recurrence; Multiple subcentimeter area of nodular enhancement in the calvarium are  nonspecific but could related to myeloma given nonvisualization on prior. O. 05/11/20: PET CT no findings to suggest FDG avid lesions of multiple myeloma. P. 05/14/20: BMBx 1% plasma cells Q. 05/28/20: SFLC: Kappa 0.84, Lambda 1.16, ratio 0.72; Hgb 11.6  R. 06/11/20: EKG QT 408/QTc 424; PFT's: FEV1 110%, DLCO 58.8% predicted, 64.4% hgb corrected; ECHO: EF 55% est, 61% calc; GLS -20%; SFLC: Kappa 1.16, Lambda 1.40, ratio 0.83; Hgb 10.9-VGPR S. 07/06/20: SFLC: Kappa 1.16, Lambda 2.14, ratio 0.54; . Hgb 11.7, Plt 118 T. 6/20-7/12/22: Hospital admission for Melphalan 200 mg/m2 followed by reinfusion of 4.71 x 10^6/kg CD34+ autologous cells  MEDICAL HISTORY:  Past Medical History:  Diagnosis Date   Anxiety    Hypertension    Pre-diabetes     SURGICAL HISTORY: Past Surgical History:  Procedure Laterality Date   ABLATION     APPLICATION OF CRANIAL NAVIGATION N/A 10/10/2019   Procedure: APPLICATION OF CRANIAL NAVIGATION;  Surgeon: Lisbeth Renshaw, MD;  Location: [facility] OR;  Service: Neurosurgery;  Laterality: N/A;  APPLICATION OF CRANIAL NAVIGATION   CRANIOPLASTY Left 10/14/2019   Procedure: LEFT CRANIOPLASTY WITH PLACEMENT OF CUSTOM CRANIAL IMPLANT;  Surgeon: Lisbeth Renshaw, MD;  Location: [facility] OR;  Service: Neurosurgery;  Laterality: Left;   CRANIOTOMY Left 10/10/2019   Procedure: Left frontal Stereotactic craniectomy for resection of tumor;  Surgeon: Lisbeth Renshaw, MD;  Location: [facility] OR;  Service: Neurosurgery;  Laterality: Left;  Left frontal Stereotactic craniectomy for resection of tumor   HAMMER TOE SURGERY Left    IR IMAGING GUIDED PORT INSERTION  12/01/2019   IR IMAGING GUIDED PORT INSERTION  08/06/2021   TUBAL LIGATION      SOCIAL HISTORY: Social History   Socioeconomic History   Marital status: Single    Spouse name: Not on file   Number of children: Not on file   Years of education: Not on file   Highest education level: Associate degree: academic program  Occupational History    Not on file  Tobacco Use   Smoking status: Never   Smokeless tobacco: Never  Vaping Use   Vaping status: Never Used  Substance and Sexual Activity   Alcohol use: Yes    Comment: occassionally   Drug use: Not Currently   Sexual activity: Not on file  Other Topics Concern   Not on file  Social History Narrative   Not on file   Social Determinants of Health   Financial Resource Strain: Medium Risk (05/15/2022)   Overall Financial Resource Strain (CARDIA)    Difficulty of Paying Living Expenses: Somewhat hard  Food Insecurity: Food Insecurity Present (05/15/2022)   Hunger Vital Sign    Worried About Running Out of Food in the Last Year: Sometimes true    Ran Out of Food in the Last Year: Never true  Transportation Needs: No Transportation Needs (05/15/2022)   PRAPARE - Administrator, Civil Service (Medical): No    Lack of Transportation (Non-Medical): No  Physical Activity: Insufficiently Active (05/15/2022)   Exercise Vital Sign    Days of Exercise per Week: 1 day    Minutes of Exercise per Session: 30 min  Stress: Stress Concern Present (05/15/2022)   [employer] of Occupational Health - Occupational Stress Questionnaire    Feeling of Stress : To some extent  Social Connections: Moderately Isolated (05/15/2022)   Social  Connection and Isolation Panel [NHANES]    Frequency of Communication with Friends and Family: Once a week    Frequency of Social Gatherings with Friends and Family: Once a week    Attends Religious Services: More than 4 times per year    Active Member of [employer] or Organizations: Yes    Attends Engineer, structural: More than 4 times per year    Marital Status: Divorced  Catering manager Violence: Not on file    FAMILY HISTORY: Family History  Problem Relation Age of Onset   Breast cancer Maternal Grandmother    Heart disease Mother    Hypertension Mother    Sarcoidosis Father    Diabetes Maternal Grandfather     ALLERGIES:   has No Known Allergies.  MEDICATIONS:  Current Outpatient Medications  Medication Sig Dispense Refill   atorvastatin (LIPITOR) 10 MG tablet Take 1 tablet (10 mg total) by mouth daily. 90 tablet 1   benazepril-hydrochlorthiazide (LOTENSIN HCT) 5-6.25 MG tablet Take 1 tablet by mouth in the morning. 90 tablet 1   bimatoprost (LATISSE) 0.03 % ophthalmic solution      Bioflavonoid Products (BIOFLEX PO) Take by mouth.     Cholecalciferol (VITAMIN D-3 PO) Take by mouth daily.     ECHINACEA PO Take by mouth.     GLUCOSAMINE-CHONDROITIN PO Take by mouth daily. Two tablets daily     levocetirizine (XYZAL) 5 MG tablet Take 1 tablet (5 mg total) by mouth every evening. 90 tablet 3   meloxicam (MOBIC) 15 MG tablet Take 1 tablet (15 mg total) by mouth daily. (Patient not taking: Reported on 10/28/2022) 30 tablet 11   Multiple Vitamins-Minerals (EMERGEN-C IMMUNE PO) Take by mouth daily. Two tablets     traMADol (ULTRAM) 50 MG tablet Take 2 tablets (100 mg total) by mouth every 6 (six) hours as needed. 30 tablet 1   traZODone (DESYREL) 50 MG tablet Take 1 - 2 tablets (50 - 100 mg total) by mouth at bedtime as needed for sleep 180 tablet 1   valACYclovir (VALTREX) 500 MG tablet Take 1 tablet by mouth 2 times a day 60 tablet 10   No current facility-administered medications for this visit.   Facility-Administered Medications Ordered in Other Visits  Medication Dose Route Frequency Provider Last Rate Last Admin   heparin lock flush 100 unit/mL  500 Units Intracatheter Once Johney Maine, MD       sodium chloride flush (NS) 0.9 % injection 10 mL  10 mL Intracatheter Once Johney Maine, MD        REVIEW OF SYSTEMS:    10 Point review of Systems was done is negative except as noted above.   PHYSICAL EXAMINATION: TELE-MED VISIT   LABORATORY DATA:  I have reviewed the data as listed     Latest Ref Rng & Units 12/15/2022   10:55 AM 10/28/2022    2:47 PM 08/26/2022   11:12 AM  CBC  WBC  4.0 - 10.5 K/uL 3.9  2.8  3.2   Hemoglobin 12.0 - 15.0 g/dL 08.6  57.8  46.9   Hematocrit 36.0 - 46.0 % 31.4  37.2  33.3   Platelets 150 - 400 K/uL 248  223  213   ANC 500     Latest Ref Rng & Units 12/15/2022   10:55 AM 10/28/2022    2:47 PM 08/26/2022   11:12 AM  CMP  Glucose 70 - 99 mg/dL 87  82  93   BUN  6 - 20 mg/dL 13  12  14    Creatinine 0.44 - 1.00 mg/dL 6.21  3.08  6.57   Sodium 135 - 145 mmol/L 142  138  141   Potassium 3.5 - 5.1 mmol/L 4.4  3.9  4.1   Chloride 98 - 111 mmol/L 108  104  106   CO2 22 - 32 mmol/L 31  28  29    Calcium 8.9 - 10.3 mg/dL 9.1  9.5  9.2   Total Protein 6.5 - 8.1 g/dL 6.2  7.6  6.4   Total Bilirubin <1.2 mg/dL 0.4  0.5  0.7   Alkaline Phos 38 - 126 U/L 63  47  54   AST 15 - 41 U/L 16  17  18    ALT 0 - 44 U/L 14  13  16      11/08/2019 FISH Plasma Cell Myeloma Prognostic Panel:    11/08/2019 Cytogenetics:    11/08/2019 Bone Marrow Report [phone]):   10/10/2019 Surgical Pathology [phone]):   05/14/2020 Surgical Pathology "-Normocellular bone marrow for age with trilineage hematopoiesis and 1%  plasma cells  -See comment   PERIPHERAL BLOOD:  -Normocytic-normochromic anemia  -Leukopenia   COMMENT:   The bone marrow is generally normocellular for age with trilineage  hematopoiesis but with predominance of erythroid precursors.  The  myeloid changes are not considered specific and likely related to  therapy.  In this background, the plasma cells represent 1% of all cells  associated with scattered small clusters and display slight lambda light  chain excess.  The findings are limited but most suggestive of minimal  residual plasma cell neoplasm.  Correlation with cytogenetic and FISH  studies is recommended. "  RADIOGRAPHIC STUDIES:  I have personally reviewed the radiological images as listed and agreed with the findings in the report. No results found.     Bone marrow biopsy on 10/14/2021:  DIAGNOSIS    A-C.  Bone marrow, aspirate smear, touch prep, clot section, core biopsy and peripheral blood smear: - Plasma cell neoplasm, based on 3% atypical plasma cells on aspirate smear and MRD flow cytometry results with 0.2% abnormal lambda-monotypic plasma cells - Aspirate smear with erythroid predominance, no increase in blasts - Core biopsy and clot section limited for evaluation; touch prep unsatisfactory for evaluation - Congo Red stain negative for amyloid deposition - See comment  Electronically signed by Valarie Merino, MD on 10/17/2021 at  6:35 PM  Diagnostic Comment    Corresponding flow cytometry analysis, [id number], shows a plasma cell population with aberrant immunophenotype comprising 0.2% of analyzed events, supporting the diagnosis above.     PET scan on 10/14/2021: IMPRESSION:   1.  No definite hypermetabolic disease.   2.  Indeterminate focus of hypermetabolic activity in the right proximal  arm musculature which is favored posttraumatic or related to spasm. This is  without CT correlate, but assessment is limited given lack of IV contrast.  Recommend attention on follow-up given the focal nature of this activity.    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 No M spike. Lambda light chain myeloma + Anemia + bone lesion on skull-- resected No renal failure, hypercalcemia  BMBx- 22-30% lambda restricted plasma cell MolCy Dup (1q), del (13q)  Completed 7 cycles of KRD  The pt recently had a transplant at [facility]. DISEASE Lambda Light Chain Multiple Myeloma  - She will receive Melphalan 200 mg/m2 conditioning regimen, followed by autologous stem cell rescue (see details  below).  TRANSPLANT: Preparative regimen:  - Melphalan 200 mg/m2 (355mg ) IV: 07/09/20  Stem Cells:  - Autologous stem cell reinfusion on 07/10/20. Patient received 4.71 x 10^6 CD34+/kg. - Her course was complicated by neutropenic fevers and Bcx 6/27 positive for E coli. She received IV cefepime from 6/27 until 6/30, which cefepime was switched to zosyn in the setting of new abdominal pain. CT abdomen/pelvis was performed that showed acute uncomplicated multifocal diverticulitis involving the transverse and descending colon. She was treated with bowel rest, prn pain medication, and IV zosyn, which was continued until engraftment on 7/5 and was transitioned to ppx ceftriaxone. Unfortunately, with the transition to ceftriaxone she had recurrent fevers and as a result was restarted on zosyn. Zosyn was transitioned to oral cipro/flagyl on 7/10 with the plan to continue for a 7 day course.    Bone marrow biopsy done on 01/18/2021 showed normocellular bone marrow with no morphologic evidence of plasma cell neoplasm. 24-hour UPEP on 01/22/2021 showed unremarkable immunofixation pattern and no monoclonal protein. PET CT scan 01/16/2021-negative for FDG avid lesions of myeloma MRI brain 01/12/2021-showed no overt active myeloma lesions.  S/p CAR-T therapy - She received Flu/Cy lymph-depletion, followed by CAR-T (Abecma) infusion.  TRANSPLANT: Preparative Regimen:  - Fludarabine (30 mg/m2) 60 mg IV on 12/2121 - 1223/23 (Day -5 thru Day -3). - Cytoxan (300 mg/m2) 600 mg IV on 01/09/22 - 01/11/22 (Day -5 thru Day -3).  Stem Cells:  - CAR-T cell reinfusion on 01/14/22    3) prediabetes 4) hypertension 5) anxiety 6) dyslipidemia 7) s/p ABnormal LFts -- azole toxicity vs CRS  PLAN: -Discussed lab results from 12/15/2022 in detail with the patient. CBC shows patient is anemic with hemoglobin of 10.2 g/dL and hematocrit of 09.8%. CMP stable. LDH in the normal range at 137. Magnesium level in the normal  range of 2.1. -Discussed with the patient that she is anemic during this visit due to her recent surgery. -not recommending any other treatments at this time besides monitoring -Patient continues to follow-up with her transplant team at [facility] and her next visit with them is in January. -answered all of patient's questions in detail  FOLLOW-UP: RTC with Dr Candise Che in 3 months Labs 1 week prior to clinic visit  The total time spent in the appointment was 20 minutes* .  All of the patient's questions were answered with apparent satisfaction. The patient knows to call the clinic with any problems, questions or concerns.   Wyvonnia Lora MD MS AAHIVMS [facility] Hematology/Oncology Physician [facility]  .*Total Encounter Time as defined by the Centers for Medicare and Medicaid Services includes, in addition to the face-to-face time of a patient visit (documented in the note above) non-face-to-face time: obtaining and reviewing outside history, ordering and reviewing medications, tests or procedures, care coordination (communications with other health care professionals or caregivers) and documentation in the medical record.   I,Param Shah,acting as a Neurosurgeon for Wyvonnia Lora, MD.,have documented all relevant documentation on the behalf of Wyvonnia Lora, MD,as directed by  Wyvonnia Lora, MD while in the presence of Wyvonnia Lora, MD.  .I have reviewed the above documentation for accuracy and completeness, and I agree with the above. Johney Maine MD

## 2023-01-05 ENCOUNTER — Encounter: Payer: Self-pay | Admitting: Hematology

## 2023-01-26 DIAGNOSIS — C9001 Multiple myeloma in remission: Secondary | ICD-10-CM | POA: Diagnosis not present

## 2023-01-28 ENCOUNTER — Other Ambulatory Visit (HOSPITAL_COMMUNITY): Payer: Self-pay

## 2023-01-28 ENCOUNTER — Encounter: Payer: Self-pay | Admitting: Hematology

## 2023-01-29 ENCOUNTER — Other Ambulatory Visit (HOSPITAL_COMMUNITY): Payer: Self-pay

## 2023-01-30 ENCOUNTER — Ambulatory Visit: Payer: Medicare HMO | Admitting: Podiatry

## 2023-01-30 ENCOUNTER — Encounter: Payer: Self-pay | Admitting: Hematology

## 2023-01-30 ENCOUNTER — Encounter: Payer: Self-pay | Admitting: Podiatry

## 2023-01-30 ENCOUNTER — Ambulatory Visit (INDEPENDENT_AMBULATORY_CARE_PROVIDER_SITE_OTHER): Payer: Medicare HMO

## 2023-01-30 DIAGNOSIS — M722 Plantar fascial fibromatosis: Secondary | ICD-10-CM

## 2023-01-30 MED ORDER — TRIAMCINOLONE ACETONIDE 10 MG/ML IJ SUSP
10.0000 mg | Freq: Once | INTRAMUSCULAR | Status: AC
  Administered 2023-01-30: 10 mg via INTRA_ARTICULAR

## 2023-02-02 DIAGNOSIS — I10 Essential (primary) hypertension: Secondary | ICD-10-CM | POA: Diagnosis not present

## 2023-02-02 DIAGNOSIS — J309 Allergic rhinitis, unspecified: Secondary | ICD-10-CM | POA: Diagnosis not present

## 2023-02-02 DIAGNOSIS — Z7985 Long-term (current) use of injectable non-insulin antidiabetic drugs: Secondary | ICD-10-CM | POA: Diagnosis not present

## 2023-02-02 DIAGNOSIS — E559 Vitamin D deficiency, unspecified: Secondary | ICD-10-CM | POA: Diagnosis not present

## 2023-02-02 DIAGNOSIS — G4700 Insomnia, unspecified: Secondary | ICD-10-CM | POA: Diagnosis not present

## 2023-02-02 DIAGNOSIS — E785 Hyperlipidemia, unspecified: Secondary | ICD-10-CM | POA: Diagnosis not present

## 2023-02-02 DIAGNOSIS — E669 Obesity, unspecified: Secondary | ICD-10-CM | POA: Diagnosis not present

## 2023-02-02 DIAGNOSIS — M2550 Pain in unspecified joint: Secondary | ICD-10-CM | POA: Diagnosis not present

## 2023-02-02 DIAGNOSIS — C9001 Multiple myeloma in remission: Secondary | ICD-10-CM | POA: Diagnosis not present

## 2023-02-02 NOTE — Progress Notes (Signed)
 Subjective:   Patient ID: Alexandra [name], female   DOB: 58 y.o.   MRN: [id number]   HPI Patient presents stating she has developed a lot of pain again in the plantar of the right heel.  States it is worse after sitting and when getting up in the morning   ROS      Objective:  Physical Exam  Neurovascular status intact reoccurrence exquisite discomfort plantar aspect right heel insertional point tendon into the calcaneus with fluid buildup     Assessment:  Chronic fasciitis of the right heel that is not responded so far conservatively     Plan:  Reviewed that ultimately this may require surgery but today I did go ahead and I did sterile prep reinjected the fascia at insertion 3 mg Kenalog  5 mg Xylocaine  and dispensed a static night splint that we will hold the foot at 90 degrees with instructions on usage and properly fitted to the lower leg  Precautionary x-rays today did not indicate a stress fracture or arthritis associated with the increasing pain

## 2023-02-09 DIAGNOSIS — J309 Allergic rhinitis, unspecified: Secondary | ICD-10-CM | POA: Diagnosis not present

## 2023-02-09 DIAGNOSIS — M2550 Pain in unspecified joint: Secondary | ICD-10-CM | POA: Diagnosis not present

## 2023-02-09 DIAGNOSIS — E785 Hyperlipidemia, unspecified: Secondary | ICD-10-CM | POA: Diagnosis not present

## 2023-02-09 DIAGNOSIS — Z7985 Long-term (current) use of injectable non-insulin antidiabetic drugs: Secondary | ICD-10-CM | POA: Diagnosis not present

## 2023-02-09 DIAGNOSIS — E559 Vitamin D deficiency, unspecified: Secondary | ICD-10-CM | POA: Diagnosis not present

## 2023-02-09 DIAGNOSIS — C9001 Multiple myeloma in remission: Secondary | ICD-10-CM | POA: Diagnosis not present

## 2023-02-09 DIAGNOSIS — I10 Essential (primary) hypertension: Secondary | ICD-10-CM | POA: Diagnosis not present

## 2023-02-09 DIAGNOSIS — E669 Obesity, unspecified: Secondary | ICD-10-CM | POA: Diagnosis not present

## 2023-02-09 DIAGNOSIS — G4700 Insomnia, unspecified: Secondary | ICD-10-CM | POA: Diagnosis not present

## 2023-02-18 ENCOUNTER — Other Ambulatory Visit: Payer: Self-pay | Admitting: Family Medicine

## 2023-02-18 ENCOUNTER — Ambulatory Visit
Admission: RE | Admit: 2023-02-18 | Discharge: 2023-02-18 | Disposition: A | Discharge status: Home / Self Care | Payer: Medicare HMO | Source: Ambulatory Visit | Attending: Family Medicine | Admitting: Family Medicine

## 2023-02-18 ENCOUNTER — Encounter: Payer: Self-pay | Admitting: Hematology

## 2023-02-18 DIAGNOSIS — G4700 Insomnia, unspecified: Secondary | ICD-10-CM | POA: Diagnosis not present

## 2023-02-18 DIAGNOSIS — E785 Hyperlipidemia, unspecified: Secondary | ICD-10-CM | POA: Diagnosis not present

## 2023-02-18 DIAGNOSIS — J309 Allergic rhinitis, unspecified: Secondary | ICD-10-CM | POA: Diagnosis not present

## 2023-02-18 DIAGNOSIS — R059 Cough, unspecified: Secondary | ICD-10-CM

## 2023-02-18 DIAGNOSIS — I10 Essential (primary) hypertension: Secondary | ICD-10-CM | POA: Diagnosis not present

## 2023-02-18 DIAGNOSIS — R0602 Shortness of breath: Secondary | ICD-10-CM

## 2023-02-18 DIAGNOSIS — M2550 Pain in unspecified joint: Secondary | ICD-10-CM | POA: Diagnosis not present

## 2023-02-18 DIAGNOSIS — F4321 Adjustment disorder with depressed mood: Secondary | ICD-10-CM | POA: Diagnosis not present

## 2023-02-18 DIAGNOSIS — E559 Vitamin D deficiency, unspecified: Secondary | ICD-10-CM | POA: Diagnosis not present

## 2023-02-18 DIAGNOSIS — Z7985 Long-term (current) use of injectable non-insulin antidiabetic drugs: Secondary | ICD-10-CM | POA: Diagnosis not present

## 2023-02-18 DIAGNOSIS — C9001 Multiple myeloma in remission: Secondary | ICD-10-CM | POA: Diagnosis not present

## 2023-02-18 DIAGNOSIS — E669 Obesity, unspecified: Secondary | ICD-10-CM | POA: Diagnosis not present

## 2023-02-23 DIAGNOSIS — I10 Essential (primary) hypertension: Secondary | ICD-10-CM | POA: Diagnosis not present

## 2023-02-23 DIAGNOSIS — E785 Hyperlipidemia, unspecified: Secondary | ICD-10-CM | POA: Diagnosis not present

## 2023-02-23 DIAGNOSIS — M2550 Pain in unspecified joint: Secondary | ICD-10-CM | POA: Diagnosis not present

## 2023-02-23 DIAGNOSIS — Z7985 Long-term (current) use of injectable non-insulin antidiabetic drugs: Secondary | ICD-10-CM | POA: Diagnosis not present

## 2023-02-23 DIAGNOSIS — C9001 Multiple myeloma in remission: Secondary | ICD-10-CM | POA: Diagnosis not present

## 2023-02-23 DIAGNOSIS — E669 Obesity, unspecified: Secondary | ICD-10-CM | POA: Diagnosis not present

## 2023-02-23 DIAGNOSIS — E559 Vitamin D deficiency, unspecified: Secondary | ICD-10-CM | POA: Diagnosis not present

## 2023-02-23 DIAGNOSIS — J309 Allergic rhinitis, unspecified: Secondary | ICD-10-CM | POA: Diagnosis not present

## 2023-02-23 DIAGNOSIS — G4700 Insomnia, unspecified: Secondary | ICD-10-CM | POA: Diagnosis not present

## 2023-02-25 DIAGNOSIS — F4321 Adjustment disorder with depressed mood: Secondary | ICD-10-CM | POA: Diagnosis not present

## 2023-03-02 DIAGNOSIS — E559 Vitamin D deficiency, unspecified: Secondary | ICD-10-CM | POA: Diagnosis not present

## 2023-03-02 DIAGNOSIS — Z7985 Long-term (current) use of injectable non-insulin antidiabetic drugs: Secondary | ICD-10-CM | POA: Diagnosis not present

## 2023-03-02 DIAGNOSIS — M2550 Pain in unspecified joint: Secondary | ICD-10-CM | POA: Diagnosis not present

## 2023-03-02 DIAGNOSIS — I10 Essential (primary) hypertension: Secondary | ICD-10-CM | POA: Diagnosis not present

## 2023-03-02 DIAGNOSIS — J309 Allergic rhinitis, unspecified: Secondary | ICD-10-CM | POA: Diagnosis not present

## 2023-03-02 DIAGNOSIS — E785 Hyperlipidemia, unspecified: Secondary | ICD-10-CM | POA: Diagnosis not present

## 2023-03-02 DIAGNOSIS — C9001 Multiple myeloma in remission: Secondary | ICD-10-CM | POA: Diagnosis not present

## 2023-03-02 DIAGNOSIS — E669 Obesity, unspecified: Secondary | ICD-10-CM | POA: Diagnosis not present

## 2023-03-02 DIAGNOSIS — G4700 Insomnia, unspecified: Secondary | ICD-10-CM | POA: Diagnosis not present

## 2023-03-04 DIAGNOSIS — F4321 Adjustment disorder with depressed mood: Secondary | ICD-10-CM | POA: Diagnosis not present

## 2023-03-09 DIAGNOSIS — M2550 Pain in unspecified joint: Secondary | ICD-10-CM | POA: Diagnosis not present

## 2023-03-09 DIAGNOSIS — E559 Vitamin D deficiency, unspecified: Secondary | ICD-10-CM | POA: Diagnosis not present

## 2023-03-09 DIAGNOSIS — Z7985 Long-term (current) use of injectable non-insulin antidiabetic drugs: Secondary | ICD-10-CM | POA: Diagnosis not present

## 2023-03-09 DIAGNOSIS — G4700 Insomnia, unspecified: Secondary | ICD-10-CM | POA: Diagnosis not present

## 2023-03-09 DIAGNOSIS — C9001 Multiple myeloma in remission: Secondary | ICD-10-CM | POA: Diagnosis not present

## 2023-03-09 DIAGNOSIS — E785 Hyperlipidemia, unspecified: Secondary | ICD-10-CM | POA: Diagnosis not present

## 2023-03-09 DIAGNOSIS — E669 Obesity, unspecified: Secondary | ICD-10-CM | POA: Diagnosis not present

## 2023-03-09 DIAGNOSIS — I10 Essential (primary) hypertension: Secondary | ICD-10-CM | POA: Diagnosis not present

## 2023-03-09 DIAGNOSIS — J309 Allergic rhinitis, unspecified: Secondary | ICD-10-CM | POA: Diagnosis not present

## 2023-03-16 DIAGNOSIS — G4700 Insomnia, unspecified: Secondary | ICD-10-CM | POA: Diagnosis not present

## 2023-03-16 DIAGNOSIS — E785 Hyperlipidemia, unspecified: Secondary | ICD-10-CM | POA: Diagnosis not present

## 2023-03-16 DIAGNOSIS — M2550 Pain in unspecified joint: Secondary | ICD-10-CM | POA: Diagnosis not present

## 2023-03-16 DIAGNOSIS — I10 Essential (primary) hypertension: Secondary | ICD-10-CM | POA: Diagnosis not present

## 2023-03-16 DIAGNOSIS — E559 Vitamin D deficiency, unspecified: Secondary | ICD-10-CM | POA: Diagnosis not present

## 2023-03-16 DIAGNOSIS — C9001 Multiple myeloma in remission: Secondary | ICD-10-CM | POA: Diagnosis not present

## 2023-03-16 DIAGNOSIS — Z7985 Long-term (current) use of injectable non-insulin antidiabetic drugs: Secondary | ICD-10-CM | POA: Diagnosis not present

## 2023-03-16 DIAGNOSIS — E669 Obesity, unspecified: Secondary | ICD-10-CM | POA: Diagnosis not present

## 2023-03-16 DIAGNOSIS — C7951 Secondary malignant neoplasm of bone: Secondary | ICD-10-CM | POA: Diagnosis not present

## 2023-03-16 DIAGNOSIS — J309 Allergic rhinitis, unspecified: Secondary | ICD-10-CM | POA: Diagnosis not present

## 2023-03-18 DIAGNOSIS — F4321 Adjustment disorder with depressed mood: Secondary | ICD-10-CM | POA: Diagnosis not present

## 2023-03-23 ENCOUNTER — Inpatient Hospital Stay: Payer: Medicare Other | Attending: Hematology

## 2023-03-23 DIAGNOSIS — J309 Allergic rhinitis, unspecified: Secondary | ICD-10-CM | POA: Diagnosis not present

## 2023-03-23 DIAGNOSIS — C9001 Multiple myeloma in remission: Secondary | ICD-10-CM | POA: Insufficient documentation

## 2023-03-23 DIAGNOSIS — C9000 Multiple myeloma not having achieved remission: Secondary | ICD-10-CM

## 2023-03-23 DIAGNOSIS — Z7985 Long-term (current) use of injectable non-insulin antidiabetic drugs: Secondary | ICD-10-CM | POA: Diagnosis not present

## 2023-03-23 DIAGNOSIS — E559 Vitamin D deficiency, unspecified: Secondary | ICD-10-CM | POA: Diagnosis not present

## 2023-03-23 DIAGNOSIS — E785 Hyperlipidemia, unspecified: Secondary | ICD-10-CM | POA: Diagnosis not present

## 2023-03-23 DIAGNOSIS — M2550 Pain in unspecified joint: Secondary | ICD-10-CM | POA: Diagnosis not present

## 2023-03-23 DIAGNOSIS — E669 Obesity, unspecified: Secondary | ICD-10-CM | POA: Diagnosis not present

## 2023-03-23 DIAGNOSIS — I10 Essential (primary) hypertension: Secondary | ICD-10-CM | POA: Diagnosis not present

## 2023-03-23 DIAGNOSIS — G4700 Insomnia, unspecified: Secondary | ICD-10-CM | POA: Diagnosis not present

## 2023-03-23 DIAGNOSIS — Z79899 Other long term (current) drug therapy: Secondary | ICD-10-CM | POA: Insufficient documentation

## 2023-03-23 LAB — CMP (CANCER CENTER ONLY)
ALT: 11 U/L (ref 0–44)
AST: 17 U/L (ref 15–41)
Albumin: 4.5 g/dL (ref 3.5–5.0)
Alkaline Phosphatase: 58 U/L (ref 38–126)
Anion gap: 4 — ABNORMAL LOW (ref 5–15)
BUN: 7 mg/dL (ref 6–20)
CO2: 30 mmol/L (ref 22–32)
Calcium: 9.5 mg/dL (ref 8.9–10.3)
Chloride: 105 mmol/L (ref 98–111)
Creatinine: 0.72 mg/dL (ref 0.44–1.00)
GFR, Estimated: >60 mL/min (ref >60)
Glucose, Bld: 91 mg/dL (ref 70–99)
Potassium: 3.7 mmol/L (ref 3.5–5.1)
Sodium: 139 mmol/L (ref 135–145)
Total Bilirubin: 0.8 mg/dL (ref 0.0–1.2)
Total Protein: 7.1 g/dL (ref 6.5–8.1)

## 2023-03-23 LAB — CBC WITH DIFFERENTIAL (CANCER CENTER ONLY)
Abs Immature Granulocytes: 0 10*3/uL (ref 0.00–0.07)
Basophils Absolute: 0 10*3/uL (ref 0.0–0.1)
Basophils Relative: 0 %
Eosinophils Absolute: 0 10*3/uL (ref 0.0–0.5)
Eosinophils Relative: 0 %
HCT: 38.8 % (ref 36.0–46.0)
Hemoglobin: 12.9 g/dL (ref 12.0–15.0)
Immature Granulocytes: 0 %
Lymphocytes Relative: 30 %
Lymphs Abs: 1 10*3/uL (ref 0.7–4.0)
MCH: 28.5 pg (ref 26.0–34.0)
MCHC: 33.2 g/dL (ref 30.0–36.0)
MCV: 85.8 fL (ref 80.0–100.0)
Monocytes Absolute: 0.3 10*3/uL (ref 0.1–1.0)
Monocytes Relative: 9 %
Neutro Abs: 2.1 10*3/uL (ref 1.7–7.7)
Neutrophils Relative %: 61 %
Platelet Count: 244 10*3/uL (ref 150–400)
RBC: 4.52 MIL/uL (ref 3.87–5.11)
RDW: 18.2 % — ABNORMAL HIGH (ref 11.5–15.5)
WBC Count: 3.4 10*3/uL — ABNORMAL LOW (ref 4.0–10.5)
nRBC: 0 % (ref 0.0–0.2)

## 2023-03-24 ENCOUNTER — Ambulatory Visit (HOSPITAL_COMMUNITY)
Admission: RE | Admit: 2023-03-24 | Discharge: 2023-03-24 | Disposition: A | Discharge status: Home / Self Care | Payer: Medicare HMO | Source: Ambulatory Visit | Attending: Radiation Oncology | Admitting: Radiation Oncology

## 2023-03-24 DIAGNOSIS — R9082 White matter disease, unspecified: Secondary | ICD-10-CM | POA: Diagnosis not present

## 2023-03-24 DIAGNOSIS — C9030 Solitary plasmacytoma not having achieved remission: Secondary | ICD-10-CM | POA: Insufficient documentation

## 2023-03-24 LAB — KAPPA/LAMBDA LIGHT CHAINS
Kappa free light chain: 7.4 mg/L (ref 3.3–19.4)
Kappa, lambda light chain ratio: 0.65 (ref 0.26–1.65)
Lambda free light chains: 11.3 mg/L (ref 5.7–26.3)

## 2023-03-24 MED ORDER — GADOBUTROL 1 MMOL/ML IV SOLN
8.0000 mL | Freq: Once | INTRAVENOUS | Status: AC | PRN
  Administered 2023-03-24: 8 mL via INTRAVENOUS

## 2023-03-26 LAB — MULTIPLE MYELOMA PANEL, SERUM
Albumin SerPl Elph-Mcnc: 3.9 g/dL (ref 2.9–4.4)
Albumin/Glob SerPl: 1.6 (ref 0.7–1.7)
Alpha 1: 0.3 g/dL (ref 0.0–0.4)
Alpha2 Glob SerPl Elph-Mcnc: 0.7 g/dL (ref 0.4–1.0)
B-Globulin SerPl Elph-Mcnc: 1 g/dL (ref 0.7–1.3)
Gamma Glob SerPl Elph-Mcnc: 0.6 g/dL (ref 0.4–1.8)
Globulin, Total: 2.6 g/dL (ref 2.2–3.9)
IgA: 43 mg/dL — ABNORMAL LOW (ref 87–352)
IgG (Immunoglobin G), Serum: 619 mg/dL (ref 586–1602)
IgM (Immunoglobulin M), Srm: 88 mg/dL (ref 26–217)
Total Protein ELP: 6.5 g/dL (ref 6.0–8.5)

## 2023-03-30 ENCOUNTER — Ambulatory Visit: Payer: Medicare Other | Admitting: Radiology

## 2023-03-31 ENCOUNTER — Inpatient Hospital Stay: Payer: Medicare Other | Admitting: Hematology

## 2023-03-31 VITALS — BP 95/76 | HR 77 | Temp 97.5°F | Resp 17 | Ht 67.0 in | Wt 176.4 lb

## 2023-03-31 DIAGNOSIS — C9000 Multiple myeloma not having achieved remission: Secondary | ICD-10-CM

## 2023-03-31 DIAGNOSIS — R300 Dysuria: Secondary | ICD-10-CM | POA: Diagnosis not present

## 2023-03-31 NOTE — Progress Notes (Signed)
 HEMATOLOGY/ONCOLOGY CLINIC NOTE  Date of Service: 03/31/23   Patient Care Team: [name], FNP as PCP - General (General Practice)  CHIEF COMPLAINTS/PURPOSE OF CONSULTATION:  Follow-up for continued evaluation and management of multiple myeloma.  HISTORY OF PRESENTING ILLNESS:   Please see previous note for details  INTERVAL HISTORY:  Alexandra [name] is connected via phone for continued evaluation and management of her multiple myeloma.  I last connected with the patient, via tele-med visit, on 12/29/2022 and she complained of b/I shoulder pain and spur on her right heel.  Patient notes she has been doing well overall since our last visit. Patient is currently on weight-loss medication, which has helped her loss weight.   She denies any new infection issues, fever, chills, night sweats, unexpected weight loss, back pain, chest pain, abdominal pain, or leg swelling.   Patient had an Brain MRI on 03/24/2023, which has not resulted yet. She continues to follow-up with her transplant team at [facility] and has been scheduled for repeat PET scan at [facility] in May 2025.   She has discontinued Bactrium, but continues to be on acyclovir.   ONCOLOGIC HISTORY   Lambda Light Chain Multiple Myeloma  A. 09/27/19: CT Maxillofacial enhancing dural-based anterior cranial fossa mass traversing the calvarium and extending into the extracalvarial soft tissues along with a 7.4 mm rightward midline shift at the level of anterior cranial fossa. MRI head with and without contrast is recommended for further evaluation. B. 10/05/19: MRI Head large extra-axial mass anterior left frontal region with bone erosion and extension into the scalp soft tissues. Considerations include hemangiopericytoma, metastasis, and aggressive hemangioma. 2.6 mm enhancing lesion in the clivus.  C. 10/10/19: Left craniectomy with frontal brain tumor/bone resection, path + plasma cell neoplasm, lambda restricted, plasmacytoma  favored. Hgb 9.9,  D. 10/31/19: Initial Hem-Onc consultation; M-spike 0; IgG 627, IgQ 51, IgM 11; SFLC: Kappa 1.12 mg/dL, Lambda 027.25, ratio 3.66; IFE: serum + monoclonal free Lambda light chain; Hgb 10.1,  E. 11/04/19: 24 hour UPEP Total protein 1318, Free lambda light chains Ur 1332.46, ratio 0.02, M-spike 27.4, M-spike 24 hr at 361; Bence Jones positive, Lambda type F. 11/08/19: BMBx 22% atypical plasma cells, 30% by IHC; FISH Dup(1q) and Del(13q) detected G. 11/10/19: PET CT no metabolic activity within the skeleton to localize active myeloma; no lytic lesions identified; no abnormal activity at the left craniectomy site; no evidence of soft tissue plasmacytoma. H. 12/05/19: Started KRD; SFLC: Kappa 1.42, Lambda 177.97, ratio 0.01; Hgb 11.0 I. 12//13/21: SFLC: Kappa 1.45, Lambda 8.92, ratio 0.16; Hgb 11.1 J. 02/06/20: SFLC: Kappa 1.10, Lambda 2.08, ratio 0.53; Hgb 11.3 K. 02/26/20: Cycle 4 of KRD; SFLC: Kappa 1.14, Lambda 1.90, ratio 0.60; Hgb 10.8 L. 04/02/20: SFLC: Kappa 1.05, Lambda 1.56, ratio 0.67; Hgb 11.2 M. 04/30/20: SFLC: Kappa 1.01, Lambda 1.40, ratio 0.72; Hgb 11.5  N. 05/05/20: MRI Brain no evidence of left frontal bone lesion recurrence; Multiple subcentimeter area of nodular enhancement in the calvarium are nonspecific but could related to myeloma given nonvisualization on prior. O. 05/11/20: PET CT no findings to suggest FDG avid lesions of multiple myeloma. P. 05/14/20: BMBx 1% plasma cells Q. 05/28/20: SFLC: Kappa 0.84, Lambda 1.16, ratio 0.72; Hgb 11.6  R. 06/11/20: EKG QT 408/QTc 424; PFT's: FEV1 110%, DLCO 58.8% predicted, 64.4% hgb corrected; ECHO: EF 55% est, 61% calc; GLS -20%; SFLC: Kappa 1.16, Lambda 1.40, ratio 0.83; Hgb 10.9-VGPR S. 07/06/20: SFLC: Kappa 1.16, Lambda 2.14, ratio 0.54; . Hgb 11.7, Plt  118 T. 6/20-7/12/22: Hospital admission for Melphalan 200 mg/m2 followed by reinfusion of 4.71 x 10^6/kg CD34+ autologous cells  MEDICAL HISTORY:  Past Medical History:   Diagnosis Date   Anxiety    Hypertension    Pre-diabetes     SURGICAL HISTORY: Past Surgical History:  Procedure Laterality Date   ABLATION     APPLICATION OF CRANIAL NAVIGATION N/A 10/10/2019   Procedure: APPLICATION OF CRANIAL NAVIGATION;  Surgeon: Lisbeth Renshaw, MD;  Location: [facility] OR;  Service: Neurosurgery;  Laterality: N/A;  APPLICATION OF CRANIAL NAVIGATION   CRANIOPLASTY Left 10/14/2019   Procedure: LEFT CRANIOPLASTY WITH PLACEMENT OF CUSTOM CRANIAL IMPLANT;  Surgeon: Lisbeth Renshaw, MD;  Location: [facility] OR;  Service: Neurosurgery;  Laterality: Left;   CRANIOTOMY Left 10/10/2019   Procedure: Left frontal Stereotactic craniectomy for resection of tumor;  Surgeon: Lisbeth Renshaw, MD;  Location: [facility] OR;  Service: Neurosurgery;  Laterality: Left;  Left frontal Stereotactic craniectomy for resection of tumor   HAMMER TOE SURGERY Left    IR IMAGING GUIDED PORT INSERTION  12/01/2019   IR IMAGING GUIDED PORT INSERTION  08/06/2021   TUBAL LIGATION      SOCIAL HISTORY: Social History   Socioeconomic History   Marital status: Single    Spouse name: Not on file   Number of children: Not on file   Years of education: Not on file   Highest education level: Associate degree: academic program  Occupational History   Not on file  Tobacco Use   Smoking status: Never   Smokeless tobacco: Never  Vaping Use   Vaping status: Never Used  Substance and Sexual Activity   Alcohol use: Yes    Comment: occassionally   Drug use: Not Currently   Sexual activity: Not on file  Other Topics Concern   Not on file  Social History Narrative   Not on file   Social Drivers of Health   Financial Resource Strain: Medium Risk (05/15/2022)   Overall Financial Resource Strain (CARDIA)    Difficulty of Paying Living Expenses: Somewhat hard  Food Insecurity: Food Insecurity Present (05/15/2022)   Hunger Vital Sign    Worried About Running Out of Food in the Last Year: Sometimes true    Ran Out  of Food in the Last Year: Never true  Transportation Needs: No Transportation Needs (05/15/2022)   PRAPARE - Administrator, Civil Service (Medical): No    Lack of Transportation (Non-Medical): No  Physical Activity: Insufficiently Active (05/15/2022)   Exercise Vital Sign    Days of Exercise per Week: 1 day    Minutes of Exercise per Session: 30 min  Stress: Stress Concern Present (05/15/2022)   [employer] of Occupational Health - Occupational Stress Questionnaire    Feeling of Stress : To some extent  Social Connections: Moderately Isolated (05/15/2022)   Social Connection and Isolation Panel [NHANES]    Frequency of Communication with Friends and Family: Once a week    Frequency of Social Gatherings with Friends and Family: Once a week    Attends Religious Services: More than 4 times per year    Active Member of [employer] or Organizations: Yes    Attends Engineer, structural: More than 4 times per year    Marital Status: Divorced  Catering manager Violence: Not on file    FAMILY HISTORY: Family History  Problem Relation Age of Onset   Breast cancer Maternal Grandmother    Heart disease Mother    Hypertension Mother  Sarcoidosis Father    Diabetes Maternal Grandfather     ALLERGIES:  has no known allergies.  MEDICATIONS:  Current Outpatient Medications  Medication Sig Dispense Refill   atorvastatin (LIPITOR) 10 MG tablet Take 1 tablet (10 mg total) by mouth daily. 90 tablet 1   benazepril-hydrochlorthiazide (LOTENSIN HCT) 5-6.25 MG tablet Take 1 tablet by mouth in the morning. 90 tablet 1   bimatoprost (LATISSE) 0.03 % ophthalmic solution      Bioflavonoid Products (BIOFLEX PO) Take by mouth.     Cholecalciferol (VITAMIN D-3 PO) Take by mouth daily.     ECHINACEA PO Take by mouth.     GLUCOSAMINE-CHONDROITIN PO Take by mouth daily. Two tablets daily     levocetirizine (XYZAL) 5 MG tablet Take 1 tablet (5 mg total) by mouth every evening. 90 tablet 3    meloxicam (MOBIC) 15 MG tablet Take 1 tablet (15 mg total) by mouth daily. 30 tablet 11   Multiple Vitamins-Minerals (EMERGEN-C IMMUNE PO) Take by mouth daily. Two tablets     traMADol (ULTRAM) 50 MG tablet Take 2 tablets (100 mg total) by mouth every 6 (six) hours as needed. 30 tablet 1   traZODone (DESYREL) 50 MG tablet Take 1 - 2 tablets (50 - 100 mg total) by mouth at bedtime as needed for sleep 180 tablet 1   valACYclovir (VALTREX) 500 MG tablet Take 1 tablet by mouth 2 times a day 60 tablet 10   No current facility-administered medications for this visit.   Facility-Administered Medications Ordered in Other Visits  Medication Dose Route Frequency Provider Last Rate Last Admin   heparin lock flush 100 unit/mL  500 Units Intracatheter Once Johney Maine, MD       sodium chloride flush (NS) 0.9 % injection 10 mL  10 mL Intracatheter Once Johney Maine, MD        REVIEW OF SYSTEMS:    10 Point review of Systems was done is negative except as noted above.   PHYSICAL EXAMINATION: .BP 95/76 (BP Location: Left Arm, Patient Position: Sitting)   Pulse 77   Temp (!) 97.5 F (36.4 C) (Temporal)   Resp 17   Ht 5\' 7"  (1.702 m)   Wt 176 lb 6.4 oz (80 kg)   LMP 07/09/2015   SpO2 100%   BMI 27.63 kg/m   GENERAL:alert, in no acute distress and comfortable SKIN: no acute rashes, no significant lesions EYES: conjunctiva are pink and non-injected, sclera anicteric OROPHARYNX: MMM, no exudates, no oropharyngeal erythema or ulceration NECK: supple, no JVD LYMPH:  no palpable lymphadenopathy in the cervical, axillary or inguinal regions LUNGS: clear to auscultation b/l with normal respiratory effort HEART: regular rate & rhythm ABDOMEN:  normoactive bowel sounds , non tender, not distended. Extremity: no pedal edema PSYCH: alert & oriented x 3 with fluent speech NEURO: no focal motor/sensory deficits   LABORATORY DATA:  I have reviewed the data as listed     Latest Ref  Rng & Units 03/23/2023   10:22 AM 12/15/2022   10:55 AM 10/28/2022    2:47 PM  CBC  WBC 4.0 - 10.5 K/uL 3.4  3.9  2.8   Hemoglobin 12.0 - 15.0 g/dL 46.9  62.9  52.8   Hematocrit 36.0 - 46.0 % 38.8  31.4  37.2   Platelets 150 - 400 K/uL 244  248  223   ANC 500     Latest Ref Rng & Units 03/23/2023   10:22 AM 12/15/2022  10:55 AM 10/28/2022    2:47 PM  CMP  Glucose 70 - 99 mg/dL 91  87  82   BUN 6 - 20 mg/dL 7  13  12    Creatinine 0.44 - 1.00 mg/dL 1.61  0.96  0.45   Sodium 135 - 145 mmol/L 139  142  138   Potassium 3.5 - 5.1 mmol/L 3.7  4.4  3.9   Chloride 98 - 111 mmol/L 105  108  104   CO2 22 - 32 mmol/L 30  31  28    Calcium 8.9 - 10.3 mg/dL 9.5  9.1  9.5   Total Protein 6.5 - 8.1 g/dL 7.1  6.2  7.6   Total Bilirubin 0.0 - 1.2 mg/dL 0.8  0.4  0.5   Alkaline Phos 38 - 126 U/L 58  63  47   AST 15 - 41 U/L 17  16  17    ALT 0 - 44 U/L 11  14  13      11/08/2019 FISH Plasma Cell Myeloma Prognostic Panel:    11/08/2019 Cytogenetics:    11/08/2019 Bone Marrow Report [phone]):   10/10/2019 Surgical Pathology [phone]):   05/14/2020 Surgical Pathology "-Normocellular bone marrow for age with trilineage hematopoiesis and 1%  plasma cells  -See comment   PERIPHERAL BLOOD:  -Normocytic-normochromic anemia  -Leukopenia   COMMENT:   The bone marrow is generally normocellular for age with trilineage  hematopoiesis but with predominance of erythroid precursors.  The  myeloid changes are not considered specific and likely related to  therapy.  In this background, the plasma cells represent 1% of all cells  associated with scattered small clusters and display slight lambda light  chain excess.  The findings are limited but most suggestive of minimal  residual plasma cell neoplasm.  Correlation with cytogenetic and FISH  studies is recommended. "  RADIOGRAPHIC STUDIES:  I have personally reviewed the radiological images as listed and agreed with the findings in  the report. No results found.     Bone marrow biopsy on 10/14/2021:  DIAGNOSIS    A-C. Bone marrow, aspirate smear, touch prep, clot section, core biopsy and peripheral blood smear: - Plasma cell neoplasm, based on 3% atypical plasma cells on aspirate smear and MRD flow cytometry results with 0.2% abnormal lambda-monotypic plasma cells - Aspirate smear with erythroid predominance, no increase in blasts - Core biopsy and clot section limited for evaluation; touch prep unsatisfactory for evaluation - Congo Red stain negative for amyloid deposition - See comment  Electronically signed by Valarie Merino, MD on 10/17/2021 at  6:35 PM  Diagnostic Comment    Corresponding flow cytometry analysis, [id number], shows a plasma cell population with aberrant immunophenotype comprising 0.2% of analyzed events, supporting the diagnosis above.     PET scan on 10/14/2021: IMPRESSION:   1.  No definite hypermetabolic disease.   2.  Indeterminate focus of hypermetabolic activity in the right proximal  arm musculature which is favored posttraumatic or related to spasm. This is  without CT correlate, but assessment is limited given lack of IV contrast.  Recommend attention on follow-up given the focal nature of this activity.    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 No M spike. Lambda light chain  myeloma + Anemia + bone lesion on skull-- resected No renal failure, hypercalcemia  BMBx- 22-30% lambda restricted plasma cell MolCy Dup (1q), del (13q)  Completed 7 cycles of KRD  The pt recently had a transplant at [facility]. DISEASE Lambda Light Chain Multiple Myeloma  - She will receive Melphalan 200 mg/m2 conditioning  regimen, followed by autologous stem cell rescue (see details below).  TRANSPLANT: Preparative regimen:  - Melphalan 200 mg/m2 (355mg ) IV: 07/09/20  Stem Cells:  - Autologous stem cell reinfusion on 07/10/20. Patient received 4.71 x 10^6 CD34+/kg. - Her course was complicated by neutropenic fevers and Bcx 6/27 positive for E coli. She received IV cefepime from 6/27 until 6/30, which cefepime was switched to zosyn in the setting of new abdominal pain. CT abdomen/pelvis was performed that showed acute uncomplicated multifocal diverticulitis involving the transverse and descending colon. She was treated with bowel rest, prn pain medication, and IV zosyn, which was continued until engraftment on 7/5 and was transitioned to ppx ceftriaxone. Unfortunately, with the transition to ceftriaxone she had recurrent fevers and as a result was restarted on zosyn. Zosyn was transitioned to oral cipro/flagyl on 7/10 with the plan to continue for a 7 day course.    Bone marrow biopsy done on 01/18/2021 showed normocellular bone marrow with no morphologic evidence of plasma cell neoplasm. 24-hour UPEP on 01/22/2021 showed unremarkable immunofixation pattern and no monoclonal protein. PET CT scan 01/16/2021-negative for FDG avid lesions of myeloma MRI brain 01/12/2021-showed no overt active myeloma lesions.  S/p CAR-T therapy - She received Flu/Cy lymph-depletion, followed by CAR-T (Abecma) infusion.  TRANSPLANT: Preparative Regimen:  - Fludarabine (30 mg/m2) 60 mg IV on 12/2121 - 1223/23 (Day -5 thru Day -3). - Cytoxan (300 mg/m2) 600 mg IV on 01/09/22 - 01/11/22 (Day -5 thru Day -3).  Stem Cells:  - CAR-T cell reinfusion on 01/14/22    3) prediabetes 4) hypertension 5) anxiety 6) dyslipidemia   PLAN: -Discussed lab results from today, 03/23/2023, in detail with the patient. CBC shows slightly low CBC of 3.4 K. CMP stable.  -Discussed Multiple myeloma panel results from 03/23/2023 in detail with the  patient. Does not show M-protein. Kappa/lambda light chain results are stable.  -MRI brain results available after clinical visit showed - Stable cranioplasty site. No evidence of recurrent disease.  -not recommending any other treatments at this time besides monitoring -Patient continues to follow-up with her transplant team at [facility]. -She notes she has PET/CT scheduled for may 2025 at [facility] and shall also be having labs to check her antibody titers to determine re-vaccination requirements -answered all of patient's questions in detail  FOLLOW-UP: Labs monthly, hold if she has had labs at [facility] the same month RTC with Dr Candise Che in 3 months Labs 1 week prior to clinic visit   The total time spent in the appointment was 30 minutes* .  All of the patient's questions were answered with apparent satisfaction. The patient knows to call the clinic with any problems, questions or concerns.   Wyvonnia Lora MD MS AAHIVMS [facility] Hematology/Oncology Physician [facility]  .*Total Encounter Time as defined by the Centers for Medicare and Medicaid Services includes, in addition to the face-to-face time of a patient visit (documented in the note above) non-face-to-face time: obtaining and reviewing outside history, ordering and reviewing medications, tests or procedures, care coordination (communications with other health care professionals or caregivers) and documentation in the medical record.   I,Param Shah,acting as a Neurosurgeon  for Wyvonnia Lora, MD.,have documented all relevant documentation on the behalf of Wyvonnia Lora, MD,as directed by  Wyvonnia Lora, MD while in the presence of Wyvonnia Lora, MD.  .I have reviewed the above documentation for accuracy and completeness, and I agree with the above. Johney Maine MD

## 2023-04-01 ENCOUNTER — Encounter: Payer: Self-pay | Admitting: Urology

## 2023-04-01 ENCOUNTER — Telehealth: Payer: Self-pay | Admitting: Hematology

## 2023-04-01 ENCOUNTER — Ambulatory Visit
Admission: RE | Admit: 2023-04-01 | Discharge: 2023-04-01 | Disposition: A | Discharge status: Home / Self Care | Payer: Medicare Other | Source: Ambulatory Visit | Attending: Urology | Admitting: Urology

## 2023-04-01 DIAGNOSIS — C7951 Secondary malignant neoplasm of bone: Secondary | ICD-10-CM

## 2023-04-01 DIAGNOSIS — C9002 Multiple myeloma in relapse: Secondary | ICD-10-CM | POA: Diagnosis not present

## 2023-04-01 DIAGNOSIS — R1031 Right lower quadrant pain: Secondary | ICD-10-CM

## 2023-04-01 DIAGNOSIS — D496 Neoplasm of unspecified behavior of brain: Secondary | ICD-10-CM

## 2023-04-01 DIAGNOSIS — C9001 Multiple myeloma in remission: Secondary | ICD-10-CM

## 2023-04-01 DIAGNOSIS — F4321 Adjustment disorder with depressed mood: Secondary | ICD-10-CM | POA: Diagnosis not present

## 2023-04-01 NOTE — Progress Notes (Signed)
 Telephone nursing appointment for review of most recent MRI results. I verified patient's identity x2 and began nursing interview.   Patient reports doing well. Patient denies any related issues at this time.   Meaningful use complete.   Patient aware of their 11:30am 04/01/23 telephone appointment w/ Ashlyn Bruning PA-C. I left my extension [phone] in case patient needs anything. Patient verbalized understanding. This concludes the nursing interview.   Patient contact [phone]     Ruel Favors, LPN

## 2023-04-01 NOTE — Telephone Encounter (Signed)
 Left patient a vm regarding upcoming appointment

## 2023-04-01 NOTE — Progress Notes (Signed)
 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   Interval Since Last Radiation:  1 year and 10 months  06/03/21 - 06/14/21:   The recurrent tumor nodule in the left frontotemporal skull was treated to 30 Gy in 10 fractions and the cranioplasty surgical bed was simultaneously treated to 20 Gy in 10 fractions.   Narrative:  I contacted the patient to review the results of her recent MRI brain scan via telephone to spare the patient unnecessary potential exposure in the healthcare setting during the current COVID-19 pandemic.  The patient was notified in advance and gave permission to proceed with this visit format.                             Patient called our office on 09/25/2022 requesting an early repeat MRI to evaluate some new onset cramping to her left hand and twitching to her left eye for 2 weeks. She had recently tried a new diet that may have caused her to be dehydrated and she noticed her eye twitching improved, spontaneously resolved with increasing her fluid intake. The MRI of the brain from 09/26/2022 showed postoperative changes of bifrontal craniotomy and cranioplasty; subjacent small fluid collection is unchanged; mild overlying dural thickening and enhancement is also unchanged; no new suspicious/nodular enhancement; similar focus of subjacent gliosis/encephalomalacia in the anterior left frontal lobe. She was also evaluated with Dr. 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 The eyelid twitching was likely fasciculation. No suspicion for epilepsy at this time. Recommendation was to continue with serial MRI brain scans every 6 months  to monitor for any evidence of disease recurrence or progression.  She had a repeat MRI brain scan on 03/24/23 that showed stable cranioplasty site without evidence of new or recurrent disease. We reviewed these findings by telephone today.   ALLERGIES:  has no known allergies.  Meds: Current Outpatient Medications  Medication Sig Dispense Refill   atorvastatin (LIPITOR) 10 MG tablet Take 1 tablet (10 mg total) by mouth daily. 90 tablet 1   benazepril-hydrochlorthiazide (LOTENSIN HCT) 5-6.25 MG tablet Take 1 tablet by mouth in the morning. 90 tablet 1   bimatoprost (LATISSE) 0.03 % ophthalmic solution      Bioflavonoid Products (BIOFLEX PO) Take by mouth.     Cholecalciferol (VITAMIN D-3 PO) Take by mouth daily.     ECHINACEA PO Take by mouth.     GLUCOSAMINE-CHONDROITIN PO Take by mouth daily. Two tablets daily     levocetirizine (XYZAL) 5 MG tablet Take 1 tablet (5 mg total) by mouth every evening. 90 tablet 3   meloxicam (MOBIC) 15 MG tablet Take 1 tablet (15 mg total) by mouth daily. 30 tablet 11   Multiple Vitamins-Minerals (EMERGEN-C IMMUNE PO) Take by mouth daily. Two tablets     traMADol (ULTRAM) 50 MG tablet Take 2 tablets (100 mg total) by mouth every 6 (six) hours as needed. 30 tablet 1   traZODone (DESYREL) 50 MG tablet Take 1 - 2 tablets (50 - 100 mg total) by mouth at bedtime as needed for sleep 180 tablet 1  valACYclovir (VALTREX) 500 MG tablet Take 1 tablet by mouth 2 times a day 60 tablet 10   No current facility-administered medications for this encounter.   Facility-Administered Medications Ordered in Other Encounters  Medication Dose Route Frequency Provider Last Rate Last Admin   heparin lock flush 100 unit/mL  500 Units Intracatheter Once Johney Maine, MD       sodium chloride flush (NS) 0.9 % injection 10 mL  10 mL Intracatheter Once Johney Maine, MD        Physical Findings:  vitals were not taken for this visit.   /10 Unable to assess due to  telephone follow-up visit format.  Lab Findings: Lab Results  Component Value Date   WBC 3.4 (L) 03/23/2023   HGB 12.9 03/23/2023   HCT 38.8 03/23/2023   MCV 85.8 03/23/2023   PLT 244 03/23/2023     Radiographic Findings: MR Brain W Wo Contrast Result Date: 04/01/2023 CLINICAL DATA:  Assess treatment response. CNS neoplasm (plasmacytoma with craniectomy). EXAM: MRI HEAD WITHOUT AND WITH CONTRAST TECHNIQUE: Multiplanar, multiecho pulse sequences of the brain and surrounding structures were obtained without and with intravenous contrast. CONTRAST:  8mL GADAVIST GADOBUTROL 1 MMOL/ML IV SOLN COMPARISON:  09/26/2022 FINDINGS: Brain: No abnormal enhancement seen around the frontal cranioplasty site. Mild dural thickening beneath the bone flap is expected and stable. T2 hyperintensity in the left more than right anterior frontal lobes at the operative site does not clearly have volume loss but remains stable and most consistent with gliosis. Chronic T2 hyperintensity elsewhere in the cerebral white matter and in the pons, likely chronic small vessel ischemia. No and interval infarct, hemorrhage, hydrocephalus, or collection. Vascular: Major flow voids and vascular enhancements are preserved. Skull and upper cervical spine: Unremarkable cranioplasty site. No new or aggressive bone lesion. Sinuses/Orbits: Unremarkable IMPRESSION: Stable cranioplasty site.  No evidence of recurrent disease. Electronically Signed   By: Tiburcio Pea M.D.   On: 04/01/2023 08:27    Impression/Plan: 1. 58 yo woman with recurrent left frontal skull plasmacytoma; s/p surgical resection in 09/2019. She has not had any further clinical symptoms concerning for seizure activity and is currently without complaints. Her recent surveillance MRI brain scan from 03/24/23 shows a stable cranioplasty site without evidence of new or recurrent disease so we will continue to closely monitor with serial MRI brain scans every 6 months and  follow-up by telephone following each scan to review results and recommendations from the multidisciplinary brain conference.  She appears to have a good understanding of these recommendations and is comfortable and in agreement with the stated plan.  She was advised to call at anytime with any questions or concerns in the interim.  I personally spent 30 minutes in this encounter including chart review, reviewing radiological studies, telephone conversation with the patient, entering orders and completing documentation.    Marguarite Arbour, MMS, PA-C [place]  [facility] Radiation Oncology Physician Assistant Direct Dial: [phone]  Fax: [phone]

## 2023-04-03 ENCOUNTER — Other Ambulatory Visit: Payer: Self-pay

## 2023-04-03 DIAGNOSIS — C9000 Multiple myeloma not having achieved remission: Secondary | ICD-10-CM

## 2023-04-05 ENCOUNTER — Encounter: Payer: Self-pay | Admitting: Hematology

## 2023-04-06 ENCOUNTER — Inpatient Hospital Stay

## 2023-04-06 DIAGNOSIS — E669 Obesity, unspecified: Secondary | ICD-10-CM | POA: Diagnosis not present

## 2023-04-06 DIAGNOSIS — J309 Allergic rhinitis, unspecified: Secondary | ICD-10-CM | POA: Diagnosis not present

## 2023-04-06 DIAGNOSIS — I10 Essential (primary) hypertension: Secondary | ICD-10-CM | POA: Diagnosis not present

## 2023-04-06 DIAGNOSIS — C9001 Multiple myeloma in remission: Secondary | ICD-10-CM | POA: Diagnosis not present

## 2023-04-06 DIAGNOSIS — E785 Hyperlipidemia, unspecified: Secondary | ICD-10-CM | POA: Diagnosis not present

## 2023-04-06 DIAGNOSIS — Z7985 Long-term (current) use of injectable non-insulin antidiabetic drugs: Secondary | ICD-10-CM | POA: Diagnosis not present

## 2023-04-06 DIAGNOSIS — M2550 Pain in unspecified joint: Secondary | ICD-10-CM | POA: Diagnosis not present

## 2023-04-06 DIAGNOSIS — G4700 Insomnia, unspecified: Secondary | ICD-10-CM | POA: Diagnosis not present

## 2023-04-06 DIAGNOSIS — E559 Vitamin D deficiency, unspecified: Secondary | ICD-10-CM | POA: Diagnosis not present

## 2023-04-13 ENCOUNTER — Inpatient Hospital Stay

## 2023-04-14 ENCOUNTER — Encounter: Payer: Self-pay | Admitting: Nurse Practitioner

## 2023-04-14 ENCOUNTER — Ambulatory Visit: Payer: Medicare HMO | Admitting: Nurse Practitioner

## 2023-04-14 VITALS — BP 136/80 | HR 86 | Temp 98.2°F | Ht 67.0 in | Wt 177.0 lb

## 2023-04-14 DIAGNOSIS — I10 Essential (primary) hypertension: Secondary | ICD-10-CM

## 2023-04-14 DIAGNOSIS — E782 Mixed hyperlipidemia: Secondary | ICD-10-CM

## 2023-04-14 DIAGNOSIS — N3000 Acute cystitis without hematuria: Secondary | ICD-10-CM

## 2023-04-14 DIAGNOSIS — R7303 Prediabetes: Secondary | ICD-10-CM | POA: Diagnosis not present

## 2023-04-14 DIAGNOSIS — Z0000 Encounter for general adult medical examination without abnormal findings: Secondary | ICD-10-CM

## 2023-04-14 DIAGNOSIS — Z79899 Other long term (current) drug therapy: Secondary | ICD-10-CM

## 2023-04-14 LAB — POCT URINALYSIS DIPSTICK
Bilirubin, UA: NEGATIVE
Glucose, UA: NEGATIVE
Ketones, UA: NEGATIVE
Nitrite, UA: NEGATIVE
Protein, UA: NEGATIVE
Spec Grav, UA: 1.02 (ref 1.010–1.025)
Urobilinogen, UA: 0.2 U/dL
pH, UA: 6 (ref 5.0–8.0)

## 2023-04-14 LAB — HEMOGLOBIN A1C
Est. average glucose Bld gHb Est-mCnc: 120 mg/dL
Hgb A1c MFr Bld: 5.8 % — ABNORMAL HIGH (ref 4.8–5.6)

## 2023-04-14 NOTE — Patient Instructions (Signed)

## 2023-04-14 NOTE — Progress Notes (Signed)
 I,Jameka J Llittleton, CMA,acting as a Neurosurgeon for [employer], FNP.,have documented all relevant documentation on the behalf of [name], FNP,as directed by  [name], FNP while in the presence of [name], FNP.  Subjective:    Patient ID: Alexandra [name] , female    DOB: 1965/05/23 , 58 y.o.   MRN: [id number]  Chief Complaint  Patient presents with   Annual Exam    HPI  Patient is here for physical exam. She is followed by [facility] for her GYN care [facility] but is leaving. She was seen last week for a female issue and UTI, does not feel has been totally taken care of. She has been drinking cranberry juice.  Patient is concerned she may still have a uti. Continues to see [facility] - no treatment since December 2023. 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 She had a breast reduction in December and feeling better. She is no longer having shoulder and back pain better.     Past Medical History:  Diagnosis Date   Anxiety    Hypertension    Pre-diabetes      Family History  Problem Relation Age of Onset   Breast cancer Maternal Grandmother    Heart disease Mother    Hypertension Mother    Sarcoidosis Father    Diabetes Maternal Grandfather      Current Outpatient Medications:    atorvastatin (LIPITOR) 10 MG tablet, Take 1 tablet (10 mg total) by mouth daily., Disp: 90 tablet, Rfl: 1   benazepril-hydrochlorthiazide (LOTENSIN HCT) 5-6.25 MG tablet, Take 1 tablet by mouth in the morning., Disp: 90 tablet, Rfl: 1   bimatoprost (LATISSE) 0.03 % ophthalmic solution, , Disp: , Rfl:    Bioflavonoid Products (BIOFLEX PO), Take by mouth., Disp: , Rfl:    Cholecalciferol (VITAMIN D-3 PO), Take by mouth daily., Disp: , Rfl:    ECHINACEA PO, Take by mouth., Disp: , Rfl:    GLUCOSAMINE-CHONDROITIN PO, Take by mouth daily. Two tablets daily, Disp: , Rfl:    levocetirizine (XYZAL) 5 MG tablet, Take 1 tablet (5 mg total) by mouth every evening.,  Disp: 90 tablet, Rfl: 3   meloxicam (MOBIC) 15 MG tablet, Take 1 tablet (15 mg total) by mouth daily., Disp: 30 tablet, Rfl: 11   Multiple Vitamins-Minerals (EMERGEN-C IMMUNE PO), Take by mouth daily. Two tablets, Disp: , Rfl:    traMADol (ULTRAM) 50 MG tablet, Take 2 tablets (100 mg total) by mouth every 6 (six) hours as needed., Disp: 30 tablet, Rfl: 1   traZODone (DESYREL) 50 MG tablet, Take 1 - 2 tablets (50 - 100 mg total) by mouth at bedtime as needed for sleep, Disp: 180 tablet, Rfl: 1   valACYclovir (VALTREX) 500 MG tablet, Take 1 tablet by mouth 2 times a day, Disp: 60 tablet, Rfl: 10   amoxicillin-clavulanate (AUGMENTIN) 875-125 MG tablet, Take 1 tablet by mouth 2 (two) times daily., Disp: 14 tablet, Rfl: 0 No current facility-administered medications for this visit.  Facility-Administered Medications Ordered in Other Visits:    heparin lock flush 100 unit/mL, 500 Units, Intracatheter, Once, Kale, Corene Cornea, MD   sodium chloride flush (NS) 0.9 % injection 10 mL, 10 mL, Intracatheter, Once, Candise Che, Corene Cornea, MD   No Known Allergies    The patient states she uses post menopausal status for birth control. Patient's last menstrual period was 07/09/2015. Negative for Dysmenorrhea and Negative for Menorrhagia. Negative for: breast discharge, breast lump(s),  breast pain and breast self exam. Associated symptoms include abnormal vaginal bleeding. Pertinent negatives include abnormal bleeding (hematology), anxiety, decreased libido, depression, difficulty falling sleep, dyspareunia, history of infertility, nocturia, sexual dysfunction, sleep disturbances, urinary incontinence, urinary urgency, vaginal discharge and vaginal itching. Diet regular; tries to avoid fast food and fried foods. Protein shake and fruit, lunch - premade salad, evening - she may or may not want food but will do another protein shake. She is going to Integrative medicine. The patient states her exercise level is  minimal due to pain to right heel, she is being followed by podiatry and thought to be a tendon.   The patient's tobacco use is:  Social History   Tobacco Use  Smoking Status Never  Smokeless Tobacco Never  . She has been exposed to passive smoke. The patient's alcohol use is:  Social History   Substance and Sexual Activity  Alcohol Use Yes   Comment: occassionally  Additional information: Last pap 03/30/2021, next one scheduled for 03/30/2024.    Review of Systems  Constitutional: Negative.   HENT: Negative.    Eyes: Negative.   Respiratory: Negative.    Cardiovascular: Negative.   Gastrointestinal: Negative.   Endocrine: Negative.   Genitourinary: Negative.   Musculoskeletal: Negative.   Skin: Negative.   Allergic/Immunologic: Negative.   Neurological: Negative.   Hematological: Negative.   Psychiatric/Behavioral: Negative.       Today's Vitals   04/14/23 1024  BP: 136/80  Pulse: 86  Temp: 98.2 F (36.8 C)  TempSrc: Oral  Weight: 177 lb (80.3 kg)  Height: 5\' 7"  (1.702 m)  PainSc: 0-No pain   Body mass index is 27.72 kg/m.  Wt Readings from Last 3 Encounters:  04/14/23 177 lb (80.3 kg)  03/31/23 176 lb 6.4 oz (80 kg)  10/28/22 180 lb 9.6 oz (81.9 kg)     Objective:  Physical Exam Vitals and nursing note reviewed.  Constitutional:      General: She is not in acute distress.    Appearance: Normal appearance. She is well-developed. She is obese.  HENT:     Head: Normocephalic and atraumatic.     Comments: Mild indention on left side of forehead from partial craniotomy    Right Ear: Hearing, tympanic membrane, ear canal and external ear normal.     Left Ear: Hearing, tympanic membrane, ear canal and external ear normal.     Nose: Nose normal.     Mouth/Throat:     Mouth: Mucous membranes are moist.  Eyes:     General: Lids are normal.     Extraocular Movements: Extraocular movements intact.     Conjunctiva/sclera: Conjunctivae normal.     Pupils:  Pupils are equal, round, and reactive to light.     Funduscopic exam:    Right eye: No papilledema.        Left eye: No papilledema.  Neck:     Thyroid: No thyroid mass.     Vascular: No carotid bruit.  Cardiovascular:     Rate and Rhythm: Normal rate and regular rhythm.     Pulses: Normal pulses.     Heart sounds: Normal heart sounds. No murmur heard. Pulmonary:     Effort: Pulmonary effort is normal. No respiratory distress.     Breath sounds: Normal breath sounds. No wheezing.  Chest:     Chest wall: No mass.  Breasts:    Tanner Score is 5.     Right: Normal.     Left: Normal.  Abdominal:     General: Bowel sounds are normal.     Palpations: Abdomen is soft.     Tenderness: There is no abdominal tenderness.  Musculoskeletal:        General: Normal range of motion.     Cervical back: Full passive range of motion without pain, normal range of motion and neck supple.  Lymphadenopathy:     Upper Body:     Right upper body: No supraclavicular, axillary or pectoral adenopathy.     Left upper body: No supraclavicular, axillary or pectoral adenopathy.  Skin:    General: Skin is warm and dry.     Capillary Refill: Capillary refill takes less than 2 seconds.  Neurological:     General: No focal deficit present.     Mental Status: She is alert and oriented to person, place, and time.     Cranial Nerves: No cranial nerve deficit.     Sensory: No sensory deficit.     Motor: No weakness.  Psychiatric:        Mood and Affect: Mood normal.        Behavior: Behavior normal.        Thought Content: Thought content normal.        Judgment: Judgment normal.      Assessment And Plan:     Routine general medical examination at a health care facility Assessment & Plan: Behavior modifications discussed and diet history reviewed.   Pt will continue to exercise regularly and modify diet with low GI, plant based foods and decrease intake of processed foods.  Recommend intake of daily  multivitamin, Vitamin D, and calcium.  Recommend mammogram and colonoscopy for preventive screenings, as well as recommend immunizations that include influenza, TDAP, and Shingles    Mixed hyperlipidemia Assessment & Plan: Cholesterol levels are stable. Continue low fat diet   Essential hypertension Assessment & Plan: Blood pressure is fairly controlled, continue current medications  Orders: -     POCT urinalysis dipstick -     Microalbumin / creatinine urine ratio -     EKG 12-Lead  Prediabetes Assessment & Plan: HgbA1c has been stable. Continue focusing on healthy diet and regular exercise as tolerated  Orders: -     Hemoglobin A1c  Acute cystitis without hematuria Assessment & Plan: She was recently treated with antibiotics, will send urine culture to see if cleared.   Orders: -     Urine Culture  Other long term (current) drug therapy    Return for 1 year physical. Patient was given opportunity to ask questions. Patient verbalized understanding of the plan and was able to repeat key elements of the plan. All questions were answered to their satisfaction.   [name], FNP  I, [name], FNP, have reviewed all documentation for this visit. The documentation on 04/14/23 for the exam, diagnosis, procedures, and orders are all accurate and complete.

## 2023-04-15 DIAGNOSIS — F4321 Adjustment disorder with depressed mood: Secondary | ICD-10-CM | POA: Diagnosis not present

## 2023-04-15 LAB — MICROALBUMIN / CREATININE URINE RATIO
Creatinine, Urine: 179.8 mg/dL
Microalb/Creat Ratio: 21 mg/g{creat} (ref 0–29)
Microalbumin, Urine: 37.3 ug/mL

## 2023-04-17 LAB — URINE CULTURE

## 2023-04-20 ENCOUNTER — Inpatient Hospital Stay

## 2023-04-20 ENCOUNTER — Other Ambulatory Visit (HOSPITAL_COMMUNITY): Payer: Self-pay

## 2023-04-20 ENCOUNTER — Other Ambulatory Visit: Payer: Self-pay | Admitting: Nurse Practitioner

## 2023-04-20 ENCOUNTER — Ambulatory Visit: Payer: Self-pay | Admitting: Nurse Practitioner

## 2023-04-20 DIAGNOSIS — I10 Essential (primary) hypertension: Secondary | ICD-10-CM | POA: Diagnosis not present

## 2023-04-20 DIAGNOSIS — M2550 Pain in unspecified joint: Secondary | ICD-10-CM | POA: Diagnosis not present

## 2023-04-20 DIAGNOSIS — C9001 Multiple myeloma in remission: Secondary | ICD-10-CM | POA: Diagnosis not present

## 2023-04-20 DIAGNOSIS — E669 Obesity, unspecified: Secondary | ICD-10-CM | POA: Diagnosis not present

## 2023-04-20 DIAGNOSIS — E785 Hyperlipidemia, unspecified: Secondary | ICD-10-CM | POA: Diagnosis not present

## 2023-04-20 DIAGNOSIS — Z7985 Long-term (current) use of injectable non-insulin antidiabetic drugs: Secondary | ICD-10-CM | POA: Diagnosis not present

## 2023-04-20 DIAGNOSIS — J309 Allergic rhinitis, unspecified: Secondary | ICD-10-CM | POA: Diagnosis not present

## 2023-04-20 DIAGNOSIS — G4700 Insomnia, unspecified: Secondary | ICD-10-CM | POA: Diagnosis not present

## 2023-04-20 DIAGNOSIS — E559 Vitamin D deficiency, unspecified: Secondary | ICD-10-CM | POA: Diagnosis not present

## 2023-04-20 MED ORDER — AMOXICILLIN-POT CLAVULANATE 875-125 MG PO TABS
1.0000 | ORAL_TABLET | Freq: Two times a day (BID) | ORAL | 0 refills | Status: DC
  Filled 2023-04-20: qty 14, 7d supply, fill #0

## 2023-04-20 NOTE — Telephone Encounter (Signed)
 Patient was seen in office on 04/14/23. Urinalysis results from 04/14/23 have been seen by patient, but not yet reviewed by provider (per chart). Patient is requesting antibiotic to be called in for UTI. Please advise.   Copied from CRM [id number]. Topic: Clinical - Medication Question >> Apr 20, 2023  8:55 AM Marland Kitchen D wrote: Patient got her labs back it says she has a UTI she would like a prescription nothing has been sent Preferred Pharmacy- [id number] - [place], [place] - [address]

## 2023-04-21 ENCOUNTER — Encounter: Payer: Self-pay | Admitting: Nurse Practitioner

## 2023-04-22 DIAGNOSIS — F4321 Adjustment disorder with depressed mood: Secondary | ICD-10-CM | POA: Diagnosis not present

## 2023-04-22 DIAGNOSIS — I10 Essential (primary) hypertension: Secondary | ICD-10-CM | POA: Diagnosis not present

## 2023-04-22 DIAGNOSIS — E6609 Other obesity due to excess calories: Secondary | ICD-10-CM | POA: Diagnosis not present

## 2023-04-22 DIAGNOSIS — Z6825 Body mass index (BMI) 25.0-25.9, adult: Secondary | ICD-10-CM | POA: Diagnosis not present

## 2023-04-22 DIAGNOSIS — K581 Irritable bowel syndrome with constipation: Secondary | ICD-10-CM | POA: Diagnosis not present

## 2023-04-22 DIAGNOSIS — R7303 Prediabetes: Secondary | ICD-10-CM | POA: Diagnosis not present

## 2023-04-22 DIAGNOSIS — R110 Nausea: Secondary | ICD-10-CM | POA: Diagnosis not present

## 2023-04-26 DIAGNOSIS — Z0000 Encounter for general adult medical examination without abnormal findings: Secondary | ICD-10-CM | POA: Insufficient documentation

## 2023-04-26 DIAGNOSIS — N3000 Acute cystitis without hematuria: Secondary | ICD-10-CM | POA: Insufficient documentation

## 2023-04-26 NOTE — Assessment & Plan Note (Signed)
 She was recently treated with antibiotics, will send urine culture to see if cleared.

## 2023-04-26 NOTE — Assessment & Plan Note (Signed)
 HgbA1c has been stable. Continue focusing on healthy diet and regular exercise as tolerated

## 2023-04-26 NOTE — Assessment & Plan Note (Signed)
Cholesterol levels are stable. Continue low fat diet

## 2023-04-26 NOTE — Assessment & Plan Note (Signed)
Blood pressure is fairly controlled, continue current medications.  

## 2023-04-26 NOTE — Assessment & Plan Note (Signed)

## 2023-04-27 ENCOUNTER — Inpatient Hospital Stay: Attending: Hematology

## 2023-04-27 DIAGNOSIS — C9001 Multiple myeloma in remission: Secondary | ICD-10-CM | POA: Insufficient documentation

## 2023-04-28 DIAGNOSIS — J309 Allergic rhinitis, unspecified: Secondary | ICD-10-CM | POA: Diagnosis not present

## 2023-04-28 DIAGNOSIS — C9001 Multiple myeloma in remission: Secondary | ICD-10-CM | POA: Diagnosis not present

## 2023-04-28 DIAGNOSIS — Z7985 Long-term (current) use of injectable non-insulin antidiabetic drugs: Secondary | ICD-10-CM | POA: Diagnosis not present

## 2023-04-28 DIAGNOSIS — I10 Essential (primary) hypertension: Secondary | ICD-10-CM | POA: Diagnosis not present

## 2023-04-28 DIAGNOSIS — M2550 Pain in unspecified joint: Secondary | ICD-10-CM | POA: Diagnosis not present

## 2023-04-28 DIAGNOSIS — G4700 Insomnia, unspecified: Secondary | ICD-10-CM | POA: Diagnosis not present

## 2023-04-28 DIAGNOSIS — E559 Vitamin D deficiency, unspecified: Secondary | ICD-10-CM | POA: Diagnosis not present

## 2023-04-28 DIAGNOSIS — E785 Hyperlipidemia, unspecified: Secondary | ICD-10-CM | POA: Diagnosis not present

## 2023-04-28 DIAGNOSIS — E669 Obesity, unspecified: Secondary | ICD-10-CM | POA: Diagnosis not present

## 2023-04-29 ENCOUNTER — Inpatient Hospital Stay

## 2023-04-29 DIAGNOSIS — C9000 Multiple myeloma not having achieved remission: Secondary | ICD-10-CM

## 2023-04-29 DIAGNOSIS — C9001 Multiple myeloma in remission: Secondary | ICD-10-CM | POA: Diagnosis not present

## 2023-04-29 LAB — CMP (CANCER CENTER ONLY)
ALT: 9 U/L (ref 0–44)
AST: 15 U/L (ref 15–41)
Albumin: 4.8 g/dL (ref 3.5–5.0)
Alkaline Phosphatase: 53 U/L (ref 38–126)
Anion gap: 8 (ref 5–15)
BUN: 14 mg/dL (ref 6–20)
CO2: 29 mmol/L (ref 22–32)
Calcium: 10 mg/dL (ref 8.9–10.3)
Chloride: 104 mmol/L (ref 98–111)
Creatinine: 0.7 mg/dL (ref 0.44–1.00)
GFR, Estimated: >60 mL/min (ref >60)
Glucose, Bld: 90 mg/dL (ref 70–99)
Potassium: 3.9 mmol/L (ref 3.5–5.1)
Sodium: 141 mmol/L (ref 135–145)
Total Bilirubin: 0.5 mg/dL (ref 0.0–1.2)
Total Protein: 7.3 g/dL (ref 6.5–8.1)

## 2023-04-29 LAB — CBC WITH DIFFERENTIAL (CANCER CENTER ONLY)
Abs Immature Granulocytes: 0.01 10*3/uL (ref 0.00–0.07)
Basophils Absolute: 0 10*3/uL (ref 0.0–0.1)
Basophils Relative: 0 %
Eosinophils Absolute: 0 10*3/uL (ref 0.0–0.5)
Eosinophils Relative: 0 %
HCT: 37.1 % (ref 36.0–46.0)
Hemoglobin: 12.7 g/dL (ref 12.0–15.0)
Immature Granulocytes: 0 %
Lymphocytes Relative: 20 %
Lymphs Abs: 1.3 10*3/uL (ref 0.7–4.0)
MCH: 28.4 pg (ref 26.0–34.0)
MCHC: 34.2 g/dL (ref 30.0–36.0)
MCV: 83 fL (ref 80.0–100.0)
Monocytes Absolute: 0.4 10*3/uL (ref 0.1–1.0)
Monocytes Relative: 6 %
Neutro Abs: 4.8 10*3/uL (ref 1.7–7.7)
Neutrophils Relative %: 74 %
Platelet Count: 270 10*3/uL (ref 150–400)
RBC: 4.47 MIL/uL (ref 3.87–5.11)
RDW: 17.1 % — ABNORMAL HIGH (ref 11.5–15.5)
WBC Count: 6.6 10*3/uL (ref 4.0–10.5)
nRBC: 0 % (ref 0.0–0.2)

## 2023-04-30 LAB — KAPPA/LAMBDA LIGHT CHAINS
Kappa free light chain: 7 mg/L (ref 3.3–19.4)
Kappa, lambda light chain ratio: 0.89 (ref 0.26–1.65)
Lambda free light chains: 7.9 mg/L (ref 5.7–26.3)

## 2023-05-03 LAB — MULTIPLE MYELOMA PANEL, SERUM
Albumin SerPl Elph-Mcnc: 3.9 g/dL (ref 2.9–4.4)
Albumin/Glob SerPl: 1.6 (ref 0.7–1.7)
Alpha 1: 0.3 g/dL (ref 0.0–0.4)
Alpha2 Glob SerPl Elph-Mcnc: 0.7 g/dL (ref 0.4–1.0)
B-Globulin SerPl Elph-Mcnc: 1.1 g/dL (ref 0.7–1.3)
Gamma Glob SerPl Elph-Mcnc: 0.5 g/dL (ref 0.4–1.8)
Globulin, Total: 2.6 g/dL (ref 2.2–3.9)
IgA: 50 mg/dL — ABNORMAL LOW (ref 87–352)
IgG (Immunoglobin G), Serum: 615 mg/dL (ref 586–1602)
IgM (Immunoglobulin M), Srm: 55 mg/dL (ref 26–217)
Total Protein ELP: 6.5 g/dL (ref 6.0–8.5)

## 2023-05-04 ENCOUNTER — Inpatient Hospital Stay

## 2023-05-06 DIAGNOSIS — F4321 Adjustment disorder with depressed mood: Secondary | ICD-10-CM | POA: Diagnosis not present

## 2023-05-11 ENCOUNTER — Inpatient Hospital Stay

## 2023-05-13 DIAGNOSIS — Z7985 Long-term (current) use of injectable non-insulin antidiabetic drugs: Secondary | ICD-10-CM | POA: Diagnosis not present

## 2023-05-13 DIAGNOSIS — M2550 Pain in unspecified joint: Secondary | ICD-10-CM | POA: Diagnosis not present

## 2023-05-13 DIAGNOSIS — E669 Obesity, unspecified: Secondary | ICD-10-CM | POA: Diagnosis not present

## 2023-05-13 DIAGNOSIS — I10 Essential (primary) hypertension: Secondary | ICD-10-CM | POA: Diagnosis not present

## 2023-05-13 DIAGNOSIS — E785 Hyperlipidemia, unspecified: Secondary | ICD-10-CM | POA: Diagnosis not present

## 2023-05-13 DIAGNOSIS — F4321 Adjustment disorder with depressed mood: Secondary | ICD-10-CM | POA: Diagnosis not present

## 2023-05-13 DIAGNOSIS — G4700 Insomnia, unspecified: Secondary | ICD-10-CM | POA: Diagnosis not present

## 2023-05-13 DIAGNOSIS — J309 Allergic rhinitis, unspecified: Secondary | ICD-10-CM | POA: Diagnosis not present

## 2023-05-13 DIAGNOSIS — C9001 Multiple myeloma in remission: Secondary | ICD-10-CM | POA: Diagnosis not present

## 2023-05-13 DIAGNOSIS — E559 Vitamin D deficiency, unspecified: Secondary | ICD-10-CM | POA: Diagnosis not present

## 2023-05-18 ENCOUNTER — Inpatient Hospital Stay

## 2023-05-18 DIAGNOSIS — N62 Hypertrophy of breast: Secondary | ICD-10-CM | POA: Diagnosis not present

## 2023-05-22 ENCOUNTER — Other Ambulatory Visit: Payer: Self-pay

## 2023-05-22 DIAGNOSIS — C9000 Multiple myeloma not having achieved remission: Secondary | ICD-10-CM

## 2023-05-25 ENCOUNTER — Inpatient Hospital Stay: Attending: Hematology

## 2023-05-25 ENCOUNTER — Inpatient Hospital Stay

## 2023-05-25 DIAGNOSIS — C9000 Multiple myeloma not having achieved remission: Secondary | ICD-10-CM

## 2023-05-25 DIAGNOSIS — C9001 Multiple myeloma in remission: Secondary | ICD-10-CM | POA: Diagnosis not present

## 2023-05-25 LAB — CBC WITH DIFFERENTIAL (CANCER CENTER ONLY)
Abs Immature Granulocytes: 0.01 10*3/uL (ref 0.00–0.07)
Basophils Absolute: 0 10*3/uL (ref 0.0–0.1)
Basophils Relative: 0 %
Eosinophils Absolute: 0 10*3/uL (ref 0.0–0.5)
Eosinophils Relative: 0 %
HCT: 37.5 % (ref 36.0–46.0)
Hemoglobin: 13.2 g/dL (ref 12.0–15.0)
Immature Granulocytes: 0 %
Lymphocytes Relative: 23 %
Lymphs Abs: 1.3 10*3/uL (ref 0.7–4.0)
MCH: 29.7 pg (ref 26.0–34.0)
MCHC: 35.2 g/dL (ref 30.0–36.0)
MCV: 84.5 fL (ref 80.0–100.0)
Monocytes Absolute: 0.4 10*3/uL (ref 0.1–1.0)
Monocytes Relative: 7 %
Neutro Abs: 3.8 10*3/uL (ref 1.7–7.7)
Neutrophils Relative %: 70 %
Platelet Count: 275 10*3/uL (ref 150–400)
RBC: 4.44 MIL/uL (ref 3.87–5.11)
RDW: 15.8 % — ABNORMAL HIGH (ref 11.5–15.5)
WBC Count: 5.5 10*3/uL (ref 4.0–10.5)
nRBC: 0 % (ref 0.0–0.2)

## 2023-05-25 LAB — CMP (CANCER CENTER ONLY)
ALT: 10 U/L (ref 0–44)
AST: 17 U/L (ref 15–41)
Albumin: 4.6 g/dL (ref 3.5–5.0)
Alkaline Phosphatase: 54 U/L (ref 38–126)
Anion gap: 7 (ref 5–15)
BUN: 10 mg/dL (ref 6–20)
CO2: 28 mmol/L (ref 22–32)
Calcium: 9.7 mg/dL (ref 8.9–10.3)
Chloride: 105 mmol/L (ref 98–111)
Creatinine: 0.95 mg/dL (ref 0.44–1.00)
GFR, Estimated: >60 mL/min (ref >60)
Glucose, Bld: 94 mg/dL (ref 70–99)
Potassium: 3.4 mmol/L — ABNORMAL LOW (ref 3.5–5.1)
Sodium: 140 mmol/L (ref 135–145)
Total Bilirubin: 0.6 mg/dL (ref 0.0–1.2)
Total Protein: 7.1 g/dL (ref 6.5–8.1)

## 2023-05-26 LAB — KAPPA/LAMBDA LIGHT CHAINS
Kappa free light chain: 5.7 mg/L (ref 3.3–19.4)
Kappa, lambda light chain ratio: 1.02 (ref 0.26–1.65)
Lambda free light chains: 5.6 mg/L — ABNORMAL LOW (ref 5.7–26.3)

## 2023-05-27 DIAGNOSIS — F4321 Adjustment disorder with depressed mood: Secondary | ICD-10-CM | POA: Diagnosis not present

## 2023-05-27 LAB — MULTIPLE MYELOMA PANEL, SERUM
Albumin SerPl Elph-Mcnc: 4.1 g/dL (ref 2.9–4.4)
Albumin/Glob SerPl: 1.8 — ABNORMAL HIGH (ref 0.7–1.7)
Alpha 1: 0.3 g/dL (ref 0.0–0.4)
Alpha2 Glob SerPl Elph-Mcnc: 0.7 g/dL (ref 0.4–1.0)
B-Globulin SerPl Elph-Mcnc: 1 g/dL (ref 0.7–1.3)
Gamma Glob SerPl Elph-Mcnc: 0.4 g/dL (ref 0.4–1.8)
Globulin, Total: 2.4 g/dL (ref 2.2–3.9)
IgA: 50 mg/dL — ABNORMAL LOW (ref 87–352)
IgG (Immunoglobin G), Serum: 584 mg/dL — ABNORMAL LOW (ref 586–1602)
IgM (Immunoglobulin M), Srm: 44 mg/dL (ref 26–217)
Total Protein ELP: 6.5 g/dL (ref 6.0–8.5)

## 2023-06-01 ENCOUNTER — Inpatient Hospital Stay

## 2023-06-04 ENCOUNTER — Telehealth: Payer: Self-pay

## 2023-06-04 NOTE — Telephone Encounter (Signed)
 Called patient to see how long she had medicare- if less than a year she needs Welcome to medicare with Provider- If over a year patient needs AWV ( CAN BE ON FRIDAY C[facility])

## 2023-06-05 DIAGNOSIS — N390 Urinary tract infection, site not specified: Secondary | ICD-10-CM | POA: Diagnosis not present

## 2023-06-08 ENCOUNTER — Inpatient Hospital Stay

## 2023-06-10 DIAGNOSIS — F4321 Adjustment disorder with depressed mood: Secondary | ICD-10-CM | POA: Diagnosis not present

## 2023-06-16 ENCOUNTER — Inpatient Hospital Stay

## 2023-06-16 DIAGNOSIS — J309 Allergic rhinitis, unspecified: Secondary | ICD-10-CM | POA: Diagnosis not present

## 2023-06-16 DIAGNOSIS — I10 Essential (primary) hypertension: Secondary | ICD-10-CM | POA: Diagnosis not present

## 2023-06-16 DIAGNOSIS — Z7985 Long-term (current) use of injectable non-insulin antidiabetic drugs: Secondary | ICD-10-CM | POA: Diagnosis not present

## 2023-06-16 DIAGNOSIS — E785 Hyperlipidemia, unspecified: Secondary | ICD-10-CM | POA: Diagnosis not present

## 2023-06-16 DIAGNOSIS — C9001 Multiple myeloma in remission: Secondary | ICD-10-CM | POA: Diagnosis not present

## 2023-06-16 DIAGNOSIS — M2550 Pain in unspecified joint: Secondary | ICD-10-CM | POA: Diagnosis not present

## 2023-06-16 DIAGNOSIS — E669 Obesity, unspecified: Secondary | ICD-10-CM | POA: Diagnosis not present

## 2023-06-16 DIAGNOSIS — E559 Vitamin D deficiency, unspecified: Secondary | ICD-10-CM | POA: Diagnosis not present

## 2023-06-16 DIAGNOSIS — G4700 Insomnia, unspecified: Secondary | ICD-10-CM | POA: Diagnosis not present

## 2023-06-22 ENCOUNTER — Inpatient Hospital Stay

## 2023-06-24 DIAGNOSIS — F4321 Adjustment disorder with depressed mood: Secondary | ICD-10-CM | POA: Diagnosis not present

## 2023-06-29 ENCOUNTER — Inpatient Hospital Stay

## 2023-06-29 DIAGNOSIS — Z79899 Other long term (current) drug therapy: Secondary | ICD-10-CM | POA: Diagnosis not present

## 2023-06-29 DIAGNOSIS — Z79624 Long term (current) use of inhibitors of nucleotide synthesis: Secondary | ICD-10-CM | POA: Diagnosis not present

## 2023-06-29 DIAGNOSIS — Z92850 Personal history of chimeric antigen receptor t-cell therapy: Secondary | ICD-10-CM | POA: Diagnosis not present

## 2023-06-29 DIAGNOSIS — R918 Other nonspecific abnormal finding of lung field: Secondary | ICD-10-CM | POA: Diagnosis not present

## 2023-06-29 DIAGNOSIS — Z1152 Encounter for screening for COVID-19: Secondary | ICD-10-CM | POA: Diagnosis not present

## 2023-06-29 DIAGNOSIS — J111 Influenza due to unidentified influenza virus with other respiratory manifestations: Secondary | ICD-10-CM | POA: Diagnosis not present

## 2023-06-29 DIAGNOSIS — E876 Hypokalemia: Secondary | ICD-10-CM | POA: Diagnosis not present

## 2023-06-29 DIAGNOSIS — M778 Other enthesopathies, not elsewhere classified: Secondary | ICD-10-CM | POA: Diagnosis not present

## 2023-06-29 DIAGNOSIS — Z1383 Encounter for screening for respiratory disorder NEC: Secondary | ICD-10-CM | POA: Diagnosis not present

## 2023-06-29 DIAGNOSIS — R059 Cough, unspecified: Secondary | ICD-10-CM | POA: Diagnosis not present

## 2023-06-29 DIAGNOSIS — C9001 Multiple myeloma in remission: Secondary | ICD-10-CM | POA: Diagnosis not present

## 2023-06-30 ENCOUNTER — Other Ambulatory Visit: Payer: Self-pay | Admitting: Nurse Practitioner

## 2023-06-30 DIAGNOSIS — I10 Essential (primary) hypertension: Secondary | ICD-10-CM | POA: Diagnosis not present

## 2023-06-30 DIAGNOSIS — C9001 Multiple myeloma in remission: Secondary | ICD-10-CM | POA: Diagnosis not present

## 2023-06-30 DIAGNOSIS — E559 Vitamin D deficiency, unspecified: Secondary | ICD-10-CM | POA: Diagnosis not present

## 2023-06-30 DIAGNOSIS — E785 Hyperlipidemia, unspecified: Secondary | ICD-10-CM | POA: Diagnosis not present

## 2023-06-30 DIAGNOSIS — E669 Obesity, unspecified: Secondary | ICD-10-CM | POA: Diagnosis not present

## 2023-06-30 DIAGNOSIS — G4700 Insomnia, unspecified: Secondary | ICD-10-CM | POA: Diagnosis not present

## 2023-06-30 DIAGNOSIS — M2550 Pain in unspecified joint: Secondary | ICD-10-CM | POA: Diagnosis not present

## 2023-06-30 DIAGNOSIS — J309 Allergic rhinitis, unspecified: Secondary | ICD-10-CM | POA: Diagnosis not present

## 2023-06-30 DIAGNOSIS — Z7985 Long-term (current) use of injectable non-insulin antidiabetic drugs: Secondary | ICD-10-CM | POA: Diagnosis not present

## 2023-06-30 DIAGNOSIS — Z1231 Encounter for screening mammogram for malignant neoplasm of breast: Secondary | ICD-10-CM

## 2023-07-06 ENCOUNTER — Inpatient Hospital Stay: Admitting: Hematology

## 2023-07-07 DIAGNOSIS — G4700 Insomnia, unspecified: Secondary | ICD-10-CM | POA: Diagnosis not present

## 2023-07-07 DIAGNOSIS — C9001 Multiple myeloma in remission: Secondary | ICD-10-CM | POA: Diagnosis not present

## 2023-07-07 DIAGNOSIS — I10 Essential (primary) hypertension: Secondary | ICD-10-CM | POA: Diagnosis not present

## 2023-07-07 DIAGNOSIS — J309 Allergic rhinitis, unspecified: Secondary | ICD-10-CM | POA: Diagnosis not present

## 2023-07-07 DIAGNOSIS — E785 Hyperlipidemia, unspecified: Secondary | ICD-10-CM | POA: Diagnosis not present

## 2023-07-07 DIAGNOSIS — E559 Vitamin D deficiency, unspecified: Secondary | ICD-10-CM | POA: Diagnosis not present

## 2023-07-07 DIAGNOSIS — E669 Obesity, unspecified: Secondary | ICD-10-CM | POA: Diagnosis not present

## 2023-07-07 DIAGNOSIS — Z7985 Long-term (current) use of injectable non-insulin antidiabetic drugs: Secondary | ICD-10-CM | POA: Diagnosis not present

## 2023-07-07 DIAGNOSIS — M2550 Pain in unspecified joint: Secondary | ICD-10-CM | POA: Diagnosis not present

## 2023-07-08 DIAGNOSIS — F4321 Adjustment disorder with depressed mood: Secondary | ICD-10-CM | POA: Diagnosis not present

## 2023-07-09 ENCOUNTER — Inpatient Hospital Stay: Attending: Hematology | Admitting: Hematology

## 2023-07-09 VITALS — BP 108/74 | HR 74 | Temp 97.2°F | Resp 20 | Wt 160.5 lb

## 2023-07-09 DIAGNOSIS — Z9481 Bone marrow transplant status: Secondary | ICD-10-CM | POA: Insufficient documentation

## 2023-07-09 DIAGNOSIS — Z9221 Personal history of antineoplastic chemotherapy: Secondary | ICD-10-CM | POA: Insufficient documentation

## 2023-07-09 DIAGNOSIS — C9000 Multiple myeloma not having achieved remission: Secondary | ICD-10-CM

## 2023-07-09 DIAGNOSIS — C9001 Multiple myeloma in remission: Secondary | ICD-10-CM | POA: Insufficient documentation

## 2023-07-09 NOTE — Progress Notes (Signed)
 HEMATOLOGY/ONCOLOGY CLINIC NOTE  Date of Service: 07/09/23   Patient Care Team: Georgina Speaks, FNP as PCP - General (General Practice)  CHIEF COMPLAINTS/PURPOSE OF CONSULTATION:  Follow-up for continued evaluation and management of multiple myeloma.  HISTORY OF PRESENTING ILLNESS:   Please see previous note for details  INTERVAL HISTORY:  Alexandra [name] is a 58 y.o. female here for continued evaluation and management of her multiple myeloma.  Patient was last seen by me on 03/31/2023 and was doing well overall with no new medical complaints.   She reports that her daughter unfortunately passed away in Aug 16, 2021. She also notes that her mother has dementia.   She reports that she completed breast reduction surgery in November and still has pain. She is happy with surgery as it continues to heal  She reports that she endorsed productive cough with her influenza infection. Patient reports that she continues to have phlegm at this time. She notes that she was prescribed antibiotics, though her infection was viral.  Patient denies any fever, chills, new bone pain, new headaches, abdominal pain, or change in bowel habits.   She reports that her foot issue has improved. Patient notes that she uses a vibrating device to manage her joint pain which improves symptoms.   Patient has been on Semaglutide  0.6 MG since January and reports weight loss. She notes that she used to weigh 175 prior to her breast surgery. After her surgery, her weight dropped from 190 pounds and was measured to be 149 pounds a couple weeks ago. She notes that her Semaglutide  injection is administered by integrative medicine.   She notes gaining some weight around Father's day time.   Patient noted to have received CAR-T cell therapy on January 14, 2022. Patient has not needed any additional treatment after receiving CAR-T cell therapy in December 2023. She reports that [facility] did not suggest any other maintance treatment.  Patient will follow-up with [facility] in 3 months.   She reports that she will be moving to [place] at the end of August 17, 2023, but will continue her care with her established oncologic doctors.   ONCOLOGIC HISTORY   Lambda Light Chain Multiple Myeloma  A. 09/27/19: CT Maxillofacial enhancing dural-based anterior cranial fossa mass traversing the calvarium and extending into the extracalvarial soft tissues along with a 7.4 mm rightward midline shift at the level of anterior cranial fossa. MRI head with and without contrast is recommended for further evaluation. B. 10/05/19: MRI Head large extra-axial mass anterior left frontal region with bone erosion and extension into the scalp soft tissues. Considerations include hemangiopericytoma, metastasis, and aggressive hemangioma. 2.6 mm enhancing lesion in the clivus.  C. 10/10/19: Left craniectomy with frontal brain tumor/bone resection, path + plasma cell neoplasm, lambda restricted, plasmacytoma favored. Hgb 9.9,  D. 10/31/19: Initial Hem-Onc consultation; M-spike 0; IgG 627, IgQ 51, IgM 11; SFLC: Kappa 1.12 mg/dL, Lambda 845.49, ratio 9.98; IFE: serum + monoclonal free Lambda light chain; Hgb 10.1,  E. 11/04/19: 24 hour UPEP Total protein 1318, Free lambda light chains Ur 1332.46, ratio 0.02, M-spike 27.4, M-spike 24 hr at 361; Bence Jones positive, Lambda type F. 11/08/19: BMBx 22% atypical plasma cells, 30% by IHC; FISH Dup(1q) and Del(13q) detected G. 11/10/19: PET CT no metabolic activity within the skeleton to localize active myeloma; no lytic lesions identified; no abnormal activity at the left craniectomy site; no evidence of soft tissue plasmacytoma. H. 12/05/19: Started KRD; SFLC: Kappa 1.42, Lambda 177.97, ratio 0.01; Hgb 11.0 I. 12//13/21: SFLC:  Kappa 1.45, Lambda 8.92, ratio 0.16; Hgb 11.1 J. 02/06/20: SFLC: Kappa 1.10, Lambda 2.08, ratio 0.53; Hgb 11.3 K. 02/26/20: Cycle 4 of KRD; SFLC: Kappa 1.14, Lambda 1.90, ratio 0.60; Hgb 10.8 L. 04/02/20: SFLC: Kappa  1.05, Lambda 1.56, ratio 0.67; Hgb 11.2 M. 04/30/20: SFLC: Kappa 1.01, Lambda 1.40, ratio 0.72; Hgb 11.5  N. 05/05/20: MRI Brain no evidence of left frontal bone lesion recurrence; Multiple subcentimeter area of nodular enhancement in the calvarium are nonspecific but could related to myeloma given nonvisualization on prior. O. 05/11/20: PET CT no findings to suggest FDG avid lesions of multiple myeloma. P. 05/14/20: BMBx 1% plasma cells Q. 05/28/20: SFLC: Kappa 0.84, Lambda 1.16, ratio 0.72; Hgb 11.6  R. 06/11/20: EKG QT 408/QTc 424; PFT's: FEV1 110%, DLCO 58.8% predicted, 64.4% hgb corrected; ECHO: EF 55% est, 61% calc; GLS -20%; SFLC: Kappa 1.16, Lambda 1.40, ratio 0.83; Hgb 10.9-VGPR S. 07/06/20: SFLC: Kappa 1.16, Lambda 2.14, ratio 0.54; . Hgb 11.7, Plt 118 T. 6/20-7/12/22: Hospital admission for Melphalan 200 mg/m2 followed by reinfusion of 4.71 x 10^6/kg CD34+ autologous cells  MEDICAL HISTORY:  Past Medical History:  Diagnosis Date   Anxiety    Hypertension    Pre-diabetes     SURGICAL HISTORY: Past Surgical History:  Procedure Laterality Date   ABLATION     APPLICATION OF CRANIAL NAVIGATION N/A 10/10/2019   Procedure: APPLICATION OF CRANIAL NAVIGATION;  Surgeon: Lanis Pupa, MD;  Location: [facility] OR;  Service: Neurosurgery;  Laterality: N/A;  APPLICATION OF CRANIAL NAVIGATION   CRANIOPLASTY Left 10/14/2019   Procedure: LEFT CRANIOPLASTY WITH PLACEMENT OF CUSTOM CRANIAL IMPLANT;  Surgeon: Lanis Pupa, MD;  Location: [facility] OR;  Service: Neurosurgery;  Laterality: Left;   CRANIOTOMY Left 10/10/2019   Procedure: Left frontal Stereotactic craniectomy for resection of tumor;  Surgeon: Lanis Pupa, MD;  Location: [facility] OR;  Service: Neurosurgery;  Laterality: Left;  Left frontal Stereotactic craniectomy for resection of tumor   HAMMER TOE SURGERY Left    IR IMAGING GUIDED PORT INSERTION  12/01/2019   IR IMAGING GUIDED PORT INSERTION  08/06/2021   TUBAL LIGATION      SOCIAL  HISTORY: Social History   Socioeconomic History   Marital status: Single    Spouse name: Not on file   Number of children: Not on file   Years of education: Not on file   Highest education level: Associate degree: academic program  Occupational History   Not on file  Tobacco Use   Smoking status: Never   Smokeless tobacco: Never  Vaping Use   Vaping status: Never Used  Substance and Sexual Activity   Alcohol use: Yes    Comment: occassionally   Drug use: Not Currently   Sexual activity: Not on file  Other Topics Concern   Not on file  Social History Narrative   Not on file   Social Drivers of Health   Financial Resource Strain: Medium Risk (05/15/2022)   Overall Financial Resource Strain (CARDIA)    Difficulty of Paying Living Expenses: Somewhat hard  Food Insecurity: Food Insecurity Present (05/15/2022)   Hunger Vital Sign    Worried About Running Out of Food in the Last Year: Sometimes true    Ran Out of Food in the Last Year: Never true  Transportation Needs: No Transportation Needs (05/15/2022)   PRAPARE - Administrator, Civil Service (Medical): No    Lack of Transportation (Non-Medical): No  Physical Activity: Insufficiently Active (05/15/2022)   Exercise Vital Sign  Days of Exercise per Week: 1 day    Minutes of Exercise per Session: 30 min  Stress: Stress Concern Present (05/15/2022)   [employer] of Occupational Health - Occupational Stress Questionnaire    Feeling of Stress : To some extent  Social Connections: Moderately Isolated (05/15/2022)   Social Connection and Isolation Panel    Frequency of Communication with Friends and Family: Once a week    Frequency of Social Gatherings with Friends and Family: Once a week    Attends Religious Services: More than 4 times per year    Active Member of [employer] or Organizations: Yes    Attends Engineer, structural: More than 4 times per year    Marital Status: Divorced  Catering manager  Violence: Not on file    FAMILY HISTORY: Family History  Problem Relation Age of Onset   Breast cancer Maternal Grandmother    Heart disease Mother    Hypertension Mother    Sarcoidosis Father    Diabetes Maternal Grandfather     ALLERGIES:  has no known allergies.  MEDICATIONS:  Current Outpatient Medications  Medication Sig Dispense Refill   amoxicillin -clavulanate (AUGMENTIN ) 875-125 MG tablet Take 1 tablet by mouth 2 (two) times daily. 14 tablet 0   atorvastatin  (LIPITOR) 10 MG tablet Take 1 tablet (10 mg total) by mouth daily. 90 tablet 1   benazepril -hydrochlorthiazide (LOTENSIN  HCT) 5-6.25 MG tablet Take 1 tablet by mouth in the morning. 90 tablet 1   bimatoprost (LATISSE) 0.03 % ophthalmic solution      Bioflavonoid Products (BIOFLEX PO) Take by mouth.     Cholecalciferol (VITAMIN D-3 PO) Take by mouth daily.     ECHINACEA PO Take by mouth.     GLUCOSAMINE-CHONDROITIN PO Take by mouth daily. Two tablets daily     levocetirizine (XYZAL ) 5 MG tablet Take 1 tablet (5 mg total) by mouth every evening. 90 tablet 3   meloxicam  (MOBIC ) 15 MG tablet Take 1 tablet (15 mg total) by mouth daily. 30 tablet 11   Multiple Vitamins-Minerals (EMERGEN-C IMMUNE PO) Take by mouth daily. Two tablets     traMADol  (ULTRAM ) 50 MG tablet Take 2 tablets (100 mg total) by mouth every 6 (six) hours as needed. 30 tablet 1   traZODone  (DESYREL ) 50 MG tablet Take 1 - 2 tablets (50 - 100 mg total) by mouth at bedtime as needed for sleep 180 tablet 1   valACYclovir  (VALTREX ) 500 MG tablet Take 1 tablet by mouth 2 times a day 60 tablet 10   No current facility-administered medications for this visit.   Facility-Administered Medications Ordered in Other Visits  Medication Dose Route Frequency Provider Last Rate Last Admin   heparin  lock flush 100 unit/mL  500 Units Intracatheter Once Kaniah Rizzolo Kishore, MD       sodium chloride  flush (NS) 0.9 % injection 10 mL  10 mL Intracatheter Once Adair Lemar  Kishore, MD        REVIEW OF SYSTEMS:    10 Point review of Systems was done is negative except as noted above.   PHYSICAL EXAMINATION: .LMP 07/09/2015    GENERAL:alert, in no acute distress and comfortable SKIN: no acute rashes, no significant lesions EYES: conjunctiva are pink and non-injected, sclera anicteric OROPHARYNX: MMM, no exudates, no oropharyngeal erythema or ulceration NECK: supple, no JVD LYMPH:  no palpable lymphadenopathy in the cervical, axillary or inguinal regions LUNGS: clear to auscultation b/l with normal respiratory effort HEART: regular rate & rhythm ABDOMEN:  normoactive bowel sounds , non tender, not distended. Extremity: no pedal edema PSYCH: alert & oriented x 3 with fluent speech NEURO: no focal motor/sensory deficits   LABORATORY DATA:  I have reviewed the data as listed     Latest Ref Rng & Units 05/25/2023    2:18 PM 04/29/2023    1:55 PM 03/23/2023   10:22 AM  CBC  WBC 4.0 - 10.5 K/uL 5.5  6.6  3.4   Hemoglobin 12.0 - 15.0 g/dL 86.7  87.2  87.0   Hematocrit 36.0 - 46.0 % 37.5  37.1  38.8   Platelets 150 - 400 K/uL 275  270  244   ANC 500     Latest Ref Rng & Units 05/25/2023    2:18 PM 04/29/2023    1:55 PM 03/23/2023   10:22 AM  CMP  Glucose 70 - 99 mg/dL 94  90  91   BUN 6 - 20 mg/dL 10  14  7    Creatinine 0.44 - 1.00 mg/dL 9.04  9.29  9.27   Sodium 135 - 145 mmol/L 140  141  139   Potassium 3.5 - 5.1 mmol/L 3.4  3.9  3.7   Chloride 98 - 111 mmol/L 105  104  105   CO2 22 - 32 mmol/L 28  29  30    Calcium  8.9 - 10.3 mg/dL 9.7  89.9  9.5   Total Protein 6.5 - 8.1 g/dL 7.1  7.3  7.1   Total Bilirubin 0.0 - 1.2 mg/dL 0.6  0.5  0.8   Alkaline Phos 38 - 126 U/L 54  53  58   AST 15 - 41 U/L 17  15  17    ALT 0 - 44 U/L 10  9  11      11/08/2019 FISH Plasma Cell Myeloma Prognostic Panel:    11/08/2019 Cytogenetics:    11/08/2019 Bone Marrow Report [phone]):   10/10/2019 Surgical Pathology [phone]):   05/14/2020 Surgical  Pathology -Normocellular bone marrow for age with trilineage hematopoiesis and 1%  plasma cells  -See comment   PERIPHERAL BLOOD:  -Normocytic-normochromic anemia  -Leukopenia   COMMENT:   The bone marrow is generally normocellular for age with trilineage  hematopoiesis but with predominance of erythroid precursors.  The  myeloid changes are not considered specific and likely related to  therapy.  In this background, the plasma cells represent 1% of all cells  associated with scattered small clusters and display slight lambda light  chain excess.  The findings are limited but most suggestive of minimal  residual plasma cell neoplasm.  Correlation with cytogenetic and FISH  studies is recommended.              RADIOGRAPHIC STUDIES:  I have personally reviewed the radiological images as listed and agreed with the findings in the report. No results found.   06/29/2023 PET SCAN:                 Bone marrow biopsy on 10/14/2021:  DIAGNOSIS    A-C. Bone marrow, aspirate smear, touch prep, clot section, core biopsy and peripheral blood smear: - Plasma cell neoplasm, based on 3% atypical plasma cells on aspirate smear and MRD flow cytometry results with 0.2% abnormal lambda-monotypic plasma cells - Aspirate smear with erythroid predominance, no increase in blasts - Core biopsy and clot section limited for evaluation; touch prep unsatisfactory for evaluation - Congo Red stain negative for amyloid deposition - See comment  Electronically signed by Janina Grave,  Unice, MD on 10/17/2021 at  6:35 PM  Diagnostic Comment    Corresponding flow cytometry analysis, [phone], shows a plasma cell population with aberrant immunophenotype comprising 0.2% of analyzed events, supporting the diagnosis above.     PET scan on 10/14/2021: IMPRESSION:   1.  No definite hypermetabolic disease.   2.  Indeterminate focus of hypermetabolic activity in the right proximal  arm  musculature which is favored posttraumatic or related to spasm. This is  without CT correlate, but assessment is limited given lack of IV contrast.  Recommend attention on follow-up given the focal nature of this activity.    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 No M spike. Lambda light chain myeloma + Anemia + bone lesion on skull-- resected No renal failure, hypercalcemia  BMBx- 22-30% lambda restricted plasma cell MolCy Dup (1q), del (13q)  Completed 7 cycles of KRD  The pt recently had a transplant at [facility]. DISEASE Lambda Light Chain Multiple Myeloma  - She will receive Melphalan 200 mg/m2 conditioning regimen, followed by autologous stem cell rescue (see details below).  TRANSPLANT: Preparative regimen:  - Melphalan 200 mg/m2 (355mg ) IV: 07/09/20  Stem Cells:  - Autologous stem cell reinfusion on 07/10/20. Patient received 4.71 x 10^6 CD34+/kg. - Her course was complicated by neutropenic fevers and Bcx 6/27 positive for E coli. She received IV cefepime from 6/27 until 6/30, which cefepime was switched to zosyn in the setting of new abdominal pain. CT abdomen/pelvis was performed that showed acute uncomplicated multifocal diverticulitis involving the transverse and descending colon. She was treated with bowel rest, prn pain medication, and IV zosyn, which was continued until engraftment on 7/5 and was transitioned to ppx ceftriaxone. Unfortunately, with the transition to ceftriaxone she had recurrent fevers and as a result was restarted on zosyn. Zosyn was transitioned to oral cipro /flagyl  on 7/10 with the plan to continue for a 7 day course.    Bone marrow biopsy done on 01/18/2021 showed normocellular bone  marrow with no morphologic evidence of plasma cell neoplasm. 24-hour UPEP on 01/22/2021 showed unremarkable immunofixation pattern and no monoclonal protein. PET CT scan 01/16/2021-negative for FDG avid lesions of myeloma MRI brain 01/12/2021-showed no overt active myeloma lesions.  S/p CAR-T therapy - She received Flu/Cy lymph-depletion, followed by CAR-T ([place]ma) infusion.  TRANSPLANT: Preparative Regimen:  - Fludarabine (30 mg/m2) 60 mg IV on 12/2121 - 1223/23 (Day -5 thru Day -3). - Cytoxan (300 mg/m2) 600 mg IV on 01/09/22 - 01/11/22 (Day -5 thru Day -3).  Stem Cells:  - CAR-T cell reinfusion on 01/14/22    3) prediabetes 4) hypertension 5) anxiety 6) dyslipidemia   PLAN:  -Discussed lab results from 06/29/2023 in detail with patient. CBC showed WBC of 3.6K, hemoglobin of 13.4, and platelets of 253K. -PET scan on 06/29/2023 shows no evidence of myeloma. Discussed that her influenza infection explains her mild lung changes/inflammation.  -her brain MRI in March showed No evidence of recurrent disease  -she recently received myeloma labs at [facility] on 06/29/2023, which showed that she is still in remission -her potassium was noted to be slightly low at 3.2 over a week ago -recommend consuming potassium-rich foods such as coconut water -did not feel any enlarged lymph nodes during physical  examination -continue monthly labs -her next visit with Dr. Guinevere  with [facility] is in 3 months -patient shall return to clinic with us  in 2-3 months  -we will eventually plan to alternate visits with [facility] after 3 months -answered all of patient's questions in detail   FOLLOW-UP: ***  The total time spent in the appointment was *** minutes* .  All of the patient's questions were answered with apparent satisfaction. The patient knows to call the clinic with any problems, questions or concerns.   Emaline Saran MD MS AAHIVMS [facility] Hematology/Oncology Physician [facility]  .*Total Encounter Time as defined by the Centers for Medicare and Medicaid Services includes, in addition to the face-to-face time of a patient visit (documented in the note above) non-face-to-face time: obtaining and reviewing outside history, ordering and reviewing medications, tests or procedures, care coordination (communications with other health care professionals or caregivers) and documentation in the medical record.    I,Mitra Faeizi,acting as a Neurosurgeon for Emaline Saran, MD.,have documented all relevant documentation on the behalf of Emaline Saran, MD,as directed by  Emaline Saran, MD while in the presence of Emaline Saran, MD.  ***

## 2023-07-10 ENCOUNTER — Ambulatory Visit
Admission: RE | Admit: 2023-07-10 | Discharge: 2023-07-10 | Disposition: A | Discharge status: Home / Self Care | Source: Ambulatory Visit | Attending: Nurse Practitioner | Admitting: Nurse Practitioner

## 2023-07-10 DIAGNOSIS — Z1231 Encounter for screening mammogram for malignant neoplasm of breast: Secondary | ICD-10-CM

## 2023-07-10 DIAGNOSIS — B3741 Candidal cystitis and urethritis: Secondary | ICD-10-CM | POA: Diagnosis not present

## 2023-07-10 DIAGNOSIS — R059 Cough, unspecified: Secondary | ICD-10-CM | POA: Diagnosis not present

## 2023-07-16 ENCOUNTER — Encounter: Payer: Self-pay | Admitting: Hematology

## 2023-07-20 DIAGNOSIS — D801 Nonfamilial hypogammaglobulinemia: Secondary | ICD-10-CM | POA: Diagnosis not present

## 2023-07-20 DIAGNOSIS — C9001 Multiple myeloma in remission: Secondary | ICD-10-CM | POA: Diagnosis not present

## 2023-07-20 DIAGNOSIS — Z7185 Encounter for immunization safety counseling: Secondary | ICD-10-CM | POA: Diagnosis not present

## 2023-07-20 DIAGNOSIS — Z92850 Personal history of chimeric antigen receptor t-cell therapy: Secondary | ICD-10-CM | POA: Diagnosis not present

## 2023-07-20 DIAGNOSIS — Z792 Long term (current) use of antibiotics: Secondary | ICD-10-CM | POA: Diagnosis not present

## 2023-07-21 DIAGNOSIS — E785 Hyperlipidemia, unspecified: Secondary | ICD-10-CM | POA: Diagnosis not present

## 2023-07-21 DIAGNOSIS — I10 Essential (primary) hypertension: Secondary | ICD-10-CM | POA: Diagnosis not present

## 2023-07-21 DIAGNOSIS — M2550 Pain in unspecified joint: Secondary | ICD-10-CM | POA: Diagnosis not present

## 2023-07-21 DIAGNOSIS — E559 Vitamin D deficiency, unspecified: Secondary | ICD-10-CM | POA: Diagnosis not present

## 2023-07-21 DIAGNOSIS — C9001 Multiple myeloma in remission: Secondary | ICD-10-CM | POA: Diagnosis not present

## 2023-07-21 DIAGNOSIS — Z7985 Long-term (current) use of injectable non-insulin antidiabetic drugs: Secondary | ICD-10-CM | POA: Diagnosis not present

## 2023-07-21 DIAGNOSIS — J309 Allergic rhinitis, unspecified: Secondary | ICD-10-CM | POA: Diagnosis not present

## 2023-07-21 DIAGNOSIS — G4700 Insomnia, unspecified: Secondary | ICD-10-CM | POA: Diagnosis not present

## 2023-07-21 DIAGNOSIS — E669 Obesity, unspecified: Secondary | ICD-10-CM | POA: Diagnosis not present

## 2023-07-30 ENCOUNTER — Telehealth: Payer: Self-pay | Admitting: Hematology

## 2023-07-30 DIAGNOSIS — G4700 Insomnia, unspecified: Secondary | ICD-10-CM | POA: Diagnosis not present

## 2023-07-30 DIAGNOSIS — M2550 Pain in unspecified joint: Secondary | ICD-10-CM | POA: Diagnosis not present

## 2023-07-30 DIAGNOSIS — E669 Obesity, unspecified: Secondary | ICD-10-CM | POA: Diagnosis not present

## 2023-07-30 DIAGNOSIS — E559 Vitamin D deficiency, unspecified: Secondary | ICD-10-CM | POA: Diagnosis not present

## 2023-07-30 DIAGNOSIS — I10 Essential (primary) hypertension: Secondary | ICD-10-CM | POA: Diagnosis not present

## 2023-07-30 DIAGNOSIS — Z7985 Long-term (current) use of injectable non-insulin antidiabetic drugs: Secondary | ICD-10-CM | POA: Diagnosis not present

## 2023-07-30 DIAGNOSIS — C9001 Multiple myeloma in remission: Secondary | ICD-10-CM | POA: Diagnosis not present

## 2023-07-30 DIAGNOSIS — J309 Allergic rhinitis, unspecified: Secondary | ICD-10-CM | POA: Diagnosis not present

## 2023-07-30 DIAGNOSIS — E785 Hyperlipidemia, unspecified: Secondary | ICD-10-CM | POA: Diagnosis not present

## 2023-07-30 NOTE — Telephone Encounter (Signed)
 Spoke with patient confirming upcoming appointment

## 2023-08-04 DIAGNOSIS — C9001 Multiple myeloma in remission: Secondary | ICD-10-CM | POA: Diagnosis not present

## 2023-08-04 DIAGNOSIS — M2550 Pain in unspecified joint: Secondary | ICD-10-CM | POA: Diagnosis not present

## 2023-08-04 DIAGNOSIS — E785 Hyperlipidemia, unspecified: Secondary | ICD-10-CM | POA: Diagnosis not present

## 2023-08-04 DIAGNOSIS — I10 Essential (primary) hypertension: Secondary | ICD-10-CM | POA: Diagnosis not present

## 2023-08-04 DIAGNOSIS — J309 Allergic rhinitis, unspecified: Secondary | ICD-10-CM | POA: Diagnosis not present

## 2023-08-04 DIAGNOSIS — E559 Vitamin D deficiency, unspecified: Secondary | ICD-10-CM | POA: Diagnosis not present

## 2023-08-04 DIAGNOSIS — Z7985 Long-term (current) use of injectable non-insulin antidiabetic drugs: Secondary | ICD-10-CM | POA: Diagnosis not present

## 2023-08-04 DIAGNOSIS — E669 Obesity, unspecified: Secondary | ICD-10-CM | POA: Diagnosis not present

## 2023-08-04 DIAGNOSIS — G4700 Insomnia, unspecified: Secondary | ICD-10-CM | POA: Diagnosis not present

## 2023-08-07 DIAGNOSIS — Z9189 Other specified personal risk factors, not elsewhere classified: Secondary | ICD-10-CM | POA: Diagnosis not present

## 2023-08-07 DIAGNOSIS — Z202 Contact with and (suspected) exposure to infections with a predominantly sexual mode of transmission: Secondary | ICD-10-CM | POA: Diagnosis not present

## 2023-08-07 DIAGNOSIS — Z114 Encounter for screening for human immunodeficiency virus [HIV]: Secondary | ICD-10-CM | POA: Diagnosis not present

## 2023-08-07 DIAGNOSIS — Z113 Encounter for screening for infections with a predominantly sexual mode of transmission: Secondary | ICD-10-CM | POA: Diagnosis not present

## 2023-08-07 DIAGNOSIS — Z780 Asymptomatic menopausal state: Secondary | ICD-10-CM | POA: Diagnosis not present

## 2023-08-07 DIAGNOSIS — Z1159 Encounter for screening for other viral diseases: Secondary | ICD-10-CM | POA: Diagnosis not present

## 2023-08-07 DIAGNOSIS — C9000 Multiple myeloma not having achieved remission: Secondary | ICD-10-CM | POA: Diagnosis not present

## 2023-08-07 DIAGNOSIS — M8580 Other specified disorders of bone density and structure, unspecified site: Secondary | ICD-10-CM | POA: Diagnosis not present

## 2023-08-11 DIAGNOSIS — E785 Hyperlipidemia, unspecified: Secondary | ICD-10-CM | POA: Diagnosis not present

## 2023-08-11 DIAGNOSIS — G4700 Insomnia, unspecified: Secondary | ICD-10-CM | POA: Diagnosis not present

## 2023-08-11 DIAGNOSIS — M2550 Pain in unspecified joint: Secondary | ICD-10-CM | POA: Diagnosis not present

## 2023-08-11 DIAGNOSIS — E669 Obesity, unspecified: Secondary | ICD-10-CM | POA: Diagnosis not present

## 2023-08-11 DIAGNOSIS — I10 Essential (primary) hypertension: Secondary | ICD-10-CM | POA: Diagnosis not present

## 2023-08-11 DIAGNOSIS — E559 Vitamin D deficiency, unspecified: Secondary | ICD-10-CM | POA: Diagnosis not present

## 2023-08-11 DIAGNOSIS — J309 Allergic rhinitis, unspecified: Secondary | ICD-10-CM | POA: Diagnosis not present

## 2023-08-11 DIAGNOSIS — C9001 Multiple myeloma in remission: Secondary | ICD-10-CM | POA: Diagnosis not present

## 2023-08-11 DIAGNOSIS — Z7985 Long-term (current) use of injectable non-insulin antidiabetic drugs: Secondary | ICD-10-CM | POA: Diagnosis not present

## 2023-08-24 DIAGNOSIS — J302 Other seasonal allergic rhinitis: Secondary | ICD-10-CM | POA: Diagnosis not present

## 2023-08-24 DIAGNOSIS — R87628 Other abnormal cytological findings on specimens from vagina: Secondary | ICD-10-CM | POA: Diagnosis not present

## 2023-09-03 DIAGNOSIS — I10 Essential (primary) hypertension: Secondary | ICD-10-CM | POA: Diagnosis not present

## 2023-09-03 DIAGNOSIS — E785 Hyperlipidemia, unspecified: Secondary | ICD-10-CM | POA: Diagnosis not present

## 2023-09-03 DIAGNOSIS — E669 Obesity, unspecified: Secondary | ICD-10-CM | POA: Diagnosis not present

## 2023-09-03 DIAGNOSIS — G4700 Insomnia, unspecified: Secondary | ICD-10-CM | POA: Diagnosis not present

## 2023-09-03 DIAGNOSIS — M2550 Pain in unspecified joint: Secondary | ICD-10-CM | POA: Diagnosis not present

## 2023-09-03 DIAGNOSIS — C9001 Multiple myeloma in remission: Secondary | ICD-10-CM | POA: Diagnosis not present

## 2023-09-03 DIAGNOSIS — Z7985 Long-term (current) use of injectable non-insulin antidiabetic drugs: Secondary | ICD-10-CM | POA: Diagnosis not present

## 2023-09-03 DIAGNOSIS — E559 Vitamin D deficiency, unspecified: Secondary | ICD-10-CM | POA: Diagnosis not present

## 2023-09-03 DIAGNOSIS — J309 Allergic rhinitis, unspecified: Secondary | ICD-10-CM | POA: Diagnosis not present

## 2023-09-08 DIAGNOSIS — R87810 Cervical high risk human papillomavirus (HPV) DNA test positive: Secondary | ICD-10-CM | POA: Diagnosis not present

## 2023-09-08 DIAGNOSIS — N870 Mild cervical dysplasia: Secondary | ICD-10-CM | POA: Diagnosis not present

## 2023-09-17 DIAGNOSIS — C9001 Multiple myeloma in remission: Secondary | ICD-10-CM | POA: Diagnosis not present

## 2023-09-17 DIAGNOSIS — Z92850 Personal history of chimeric antigen receptor t-cell therapy: Secondary | ICD-10-CM | POA: Diagnosis not present

## 2023-09-17 DIAGNOSIS — D801 Nonfamilial hypogammaglobulinemia: Secondary | ICD-10-CM | POA: Diagnosis not present

## 2023-09-17 DIAGNOSIS — Z7185 Encounter for immunization safety counseling: Secondary | ICD-10-CM | POA: Diagnosis not present

## 2023-09-17 DIAGNOSIS — Z9484 Stem cells transplant status: Secondary | ICD-10-CM | POA: Diagnosis not present

## 2023-09-17 DIAGNOSIS — D649 Anemia, unspecified: Secondary | ICD-10-CM | POA: Diagnosis not present

## 2023-09-24 DIAGNOSIS — E669 Obesity, unspecified: Secondary | ICD-10-CM | POA: Diagnosis not present

## 2023-09-24 DIAGNOSIS — Z7985 Long-term (current) use of injectable non-insulin antidiabetic drugs: Secondary | ICD-10-CM | POA: Diagnosis not present

## 2023-09-24 DIAGNOSIS — I10 Essential (primary) hypertension: Secondary | ICD-10-CM | POA: Diagnosis not present

## 2023-09-24 DIAGNOSIS — C9001 Multiple myeloma in remission: Secondary | ICD-10-CM | POA: Diagnosis not present

## 2023-09-24 DIAGNOSIS — E785 Hyperlipidemia, unspecified: Secondary | ICD-10-CM | POA: Diagnosis not present

## 2023-09-24 DIAGNOSIS — G4700 Insomnia, unspecified: Secondary | ICD-10-CM | POA: Diagnosis not present

## 2023-09-24 DIAGNOSIS — J309 Allergic rhinitis, unspecified: Secondary | ICD-10-CM | POA: Diagnosis not present

## 2023-09-24 DIAGNOSIS — E559 Vitamin D deficiency, unspecified: Secondary | ICD-10-CM | POA: Diagnosis not present

## 2023-09-24 DIAGNOSIS — M2550 Pain in unspecified joint: Secondary | ICD-10-CM | POA: Diagnosis not present

## 2023-09-28 ENCOUNTER — Telehealth: Payer: Self-pay | Admitting: *Deleted

## 2023-09-28 NOTE — Telephone Encounter (Signed)
 Called patient to inform of MRI for 10-02-23- arrival time- 3:30 pm @ [facility], no restrictions to scan, patient to receive results from Ashlyn Bruning on 10-07-23 @ 10 am via telephone, spoke with patient and she is aware of these appts, and the instructions

## 2023-10-01 DIAGNOSIS — E559 Vitamin D deficiency, unspecified: Secondary | ICD-10-CM | POA: Diagnosis not present

## 2023-10-01 DIAGNOSIS — C9001 Multiple myeloma in remission: Secondary | ICD-10-CM | POA: Diagnosis not present

## 2023-10-01 DIAGNOSIS — I10 Essential (primary) hypertension: Secondary | ICD-10-CM | POA: Diagnosis not present

## 2023-10-01 DIAGNOSIS — G4700 Insomnia, unspecified: Secondary | ICD-10-CM | POA: Diagnosis not present

## 2023-10-01 DIAGNOSIS — Z7985 Long-term (current) use of injectable non-insulin antidiabetic drugs: Secondary | ICD-10-CM | POA: Diagnosis not present

## 2023-10-01 DIAGNOSIS — E669 Obesity, unspecified: Secondary | ICD-10-CM | POA: Diagnosis not present

## 2023-10-01 DIAGNOSIS — J309 Allergic rhinitis, unspecified: Secondary | ICD-10-CM | POA: Diagnosis not present

## 2023-10-01 DIAGNOSIS — E785 Hyperlipidemia, unspecified: Secondary | ICD-10-CM | POA: Diagnosis not present

## 2023-10-01 DIAGNOSIS — M2550 Pain in unspecified joint: Secondary | ICD-10-CM | POA: Diagnosis not present

## 2023-10-02 ENCOUNTER — Ambulatory Visit (HOSPITAL_COMMUNITY)
Admission: RE | Admit: 2023-10-02 | Discharge: 2023-10-02 | Disposition: A | Discharge status: Home / Self Care | Source: Ambulatory Visit | Attending: Urology | Admitting: Urology

## 2023-10-02 DIAGNOSIS — C7951 Secondary malignant neoplasm of bone: Secondary | ICD-10-CM | POA: Insufficient documentation

## 2023-10-02 DIAGNOSIS — D496 Neoplasm of unspecified behavior of brain: Secondary | ICD-10-CM | POA: Insufficient documentation

## 2023-10-02 DIAGNOSIS — R9082 White matter disease, unspecified: Secondary | ICD-10-CM | POA: Diagnosis not present

## 2023-10-02 DIAGNOSIS — C9001 Multiple myeloma in remission: Secondary | ICD-10-CM | POA: Diagnosis not present

## 2023-10-02 DIAGNOSIS — G9389 Other specified disorders of brain: Secondary | ICD-10-CM | POA: Diagnosis not present

## 2023-10-02 DIAGNOSIS — Z9889 Other specified postprocedural states: Secondary | ICD-10-CM | POA: Diagnosis not present

## 2023-10-02 DIAGNOSIS — R1031 Right lower quadrant pain: Secondary | ICD-10-CM | POA: Insufficient documentation

## 2023-10-02 MED ORDER — GADOBUTROL 1 MMOL/ML IV SOLN
7.0000 mL | Freq: Once | INTRAVENOUS | Status: AC | PRN
  Administered 2023-10-02: 7 mL via INTRAVENOUS

## 2023-10-06 NOTE — Progress Notes (Signed)
 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   Interval Since Last Radiation:  2 years, 4 months 06/03/21 - 06/14/21:   The recurrent tumor nodule in the left frontotemporal skull was treated to 30 Gy in 10 fractions and the cranioplasty surgical bed was simultaneously treated to 20 Gy in 10 fractions.   Narrative:  I contacted the patient to review the results of her recent MRI brain scan via telephone to spare the patient unnecessary potential exposure in the healthcare setting during the current COVID-19 pandemic.  The patient was notified in advance and gave permission to proceed with this visit format.                             Patient called our office on 09/25/2022 requesting an early repeat MRI to evaluate some new onset cramping to her left hand and twitching to her left eye for 2 weeks. She had recently tried a new diet that may have caused her to be dehydrated and she noticed her eye twitching improved, spontaneously resolved with increasing her fluid intake. The MRI of the brain from 09/26/2022 showed postoperative changes of bifrontal craniotomy and cranioplasty; subjacent small fluid collection is unchanged; mild overlying dural thickening and enhancement is also unchanged; no new suspicious/nodular enhancement; similar focus of subjacent gliosis/encephalomalacia in the anterior left frontal lobe. She was also evaluated with Dr. 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 The eyelid twitching was likely fasciculation. No suspicion for epilepsy at this time. Recommendation was to continue with serial MRI brain scans every 6 months to  monitor for any evidence of disease recurrence or progression.  Disease restaging MRI brain scans have remained stable with her most recent scan on 10/02/23 showing stable cranioplasty site without evidence of new or recurrent disease. We reviewed these findings by telephone today.   Review of Systems: On review of systems, the patient reports that she is doing well overall.  She denies any chest pain, shortness of breath, cough, fevers, chills, night sweats, or recent unintended weight changes.  She has been on semaglutide  for weight loss and has done very well.  She specifically denies any headaches, changes in visual or auditory acuity, issues with balance, focal weakness, or seizure activity.  She denies any bowel or bladder disturbances, and denies abdominal pain, nausea or vomiting.  She denies any new musculoskeletal or joint aches or pains, new skin lesions or concerns. A complete review of systems is obtained and is otherwise negative.   ALLERGIES:  has no known allergies.  Meds: Current Outpatient Medications  Medication Sig Dispense Refill   atorvastatin  (LIPITOR) 10 MG tablet Take 1 tablet (10 mg total) by mouth daily. 90 tablet 1   benazepril -hydrochlorthiazide (LOTENSIN  HCT) 5-6.25 MG tablet Take 1 tablet by mouth in the morning. 90 tablet 1   bimatoprost (LATISSE) 0.03 % ophthalmic solution      Bioflavonoid Products (BIOFLEX PO) Take by mouth.     Cholecalciferol (VITAMIN D-3 PO) Take by mouth daily. (Patient not taking: Reported on 07/09/2023)     ECHINACEA PO Take by  mouth.     GLUCOSAMINE-CHONDROITIN PO Take by mouth daily. Two tablets daily (Patient not taking: Reported on 07/09/2023)     levocetirizine (XYZAL ) 5 MG tablet Take 1 tablet (5 mg total) by mouth every evening. 90 tablet 3   meloxicam  (MOBIC ) 15 MG tablet Take 1 tablet (15 mg total) by mouth daily. 30 tablet 11   Multiple Vitamins-Minerals (EMERGEN-C IMMUNE PO) Take by mouth daily. Two tablets     Semaglutide ,0.25  or 0.5MG /DOS, 2 MG/3ML SOPN Inject 0.4 mg into the skin once a week.     traMADol  (ULTRAM ) 50 MG tablet Take 2 tablets (100 mg total) by mouth every 6 (six) hours as needed. 30 tablet 1   traZODone  (DESYREL ) 50 MG tablet Take 1 - 2 tablets (50 - 100 mg total) by mouth at bedtime as needed for sleep 180 tablet 1   valACYclovir  (VALTREX ) 500 MG tablet Take 1 tablet by mouth 2 times a day 60 tablet 10   No current facility-administered medications for this visit.   Facility-Administered Medications Ordered in Other Visits  Medication Dose Route Frequency Provider Last Rate Last Admin   heparin  lock flush 100 unit/mL  500 Units Intracatheter Once Kale, Gautam Kishore, MD       sodium chloride  flush (NS) 0.9 % injection 10 mL  10 mL Intracatheter Once Kale, Gautam Kishore, MD        Physical Findings:  vitals were not taken for this visit.   /10 Unable to assess due to telephone follow-up visit format.  Lab Findings: Lab Results  Component Value Date   WBC 5.5 05/25/2023   HGB 13.2 05/25/2023   HCT 37.5 05/25/2023   MCV 84.5 05/25/2023   PLT 275 05/25/2023     Radiographic Findings: MR Brain W Wo Contrast Result Date: 10/06/2023 EXAM: MRI BRAIN WITH AND WITHOUT CONTRAST 10/02/2023 05:14:55 PM TECHNIQUE: Multiplanar multisequence MRI of the head/brain was performed with and without the administration of intravenous contrast. 7mL gadobutrol  (GADAVIST ) 1 MMOL/ML injection 7 mL GADOBUTROL  1 MMOL/ML IV SOLN. COMPARISON: MRI of the head dated 03/24/2023. 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 FINDINGS: BRAIN AND VENTRICLES: No acute infarct. No acute intracranial hemorrhage. No mass effect or midline shift. No hydrocephalus. The sella is unremarkable. Normal flow voids. No mass or abnormal enhancement. The patient is again noted to be status post frontal  craniotomy/cranioplasty. There is a stable appearance of the bone flap. There are mild focal encephalomalacia changes present anteriorly within the left frontal lobe with subcortical gliosis, as before. There is mild-to-moderate periventricular and subcortical cerebral white matter disease, also as before. There is no adverse interval change in the appearance of the brain or calvarium. ORBITS: No acute abnormality. SINUSES: No acute abnormality. BONES AND SOFT TISSUES: Normal bone marrow signal and enhancement. No acute soft tissue abnormality. The patient is again noted to be status post frontal craniotomy/cranioplasty. There is a stable appearance of the bone flap. IMPRESSION: 1. No acute intracranial abnormality and no adverse interval change. 2. Stable post-frontal craniotomy/cranioplasty changes and mild focal encephalomalacia in the left frontal lobe with subcortical gliosis. 3. Mild-to-moderate periventricular and subcortical cerebral white matter disease, stable. Electronically signed by: Evalene Coho MD 10/06/2023 01:01 PM EDT RP Workstation: [id number]    Impression/Plan: 1. 59 yo woman with recurrent left frontal skull plasmacytoma; s/p surgical resection in 09/2019. She has not had any further clinical symptoms concerning  for seizure activity and is currently without complaints. Her recent surveillance MRI brain scan from 10/02/23 shows a stable cranioplasty site without evidence of new or recurrent disease so we will continue to closely monitor with serial MRI brain scans every 6 months and follow-up by telephone following each scan to review results and recommendations from the multidisciplinary brain conference.  She appears to have a good understanding of these recommendations and is comfortable and in agreement with the stated plan.  She was advised to call at anytime with any questions or concerns in the interim.  I personally spent 30 minutes in this encounter including chart review,  reviewing radiological studies, telephone conversation with the patient, entering orders and completing documentation.    Sabra MICAEL Rusk, MMS, PA-C [facility] Radiation Oncology Physician Assistant Direct Dial: [phone]  Fax: [phone]

## 2023-10-07 ENCOUNTER — Telehealth: Payer: Self-pay

## 2023-10-07 ENCOUNTER — Ambulatory Visit
Admission: RE | Admit: 2023-10-07 | Discharge: 2023-10-07 | Disposition: A | Discharge status: Home / Self Care | Source: Ambulatory Visit | Attending: Urology | Admitting: Urology

## 2023-10-07 DIAGNOSIS — C9001 Multiple myeloma in remission: Secondary | ICD-10-CM

## 2023-10-07 DIAGNOSIS — C9002 Multiple myeloma in relapse: Secondary | ICD-10-CM | POA: Diagnosis not present

## 2023-10-14 DIAGNOSIS — C9001 Multiple myeloma in remission: Secondary | ICD-10-CM | POA: Diagnosis not present

## 2023-10-14 DIAGNOSIS — D649 Anemia, unspecified: Secondary | ICD-10-CM | POA: Diagnosis not present

## 2023-10-14 DIAGNOSIS — Z92850 Personal history of chimeric antigen receptor t-cell therapy: Secondary | ICD-10-CM | POA: Diagnosis not present

## 2023-10-22 DIAGNOSIS — Z92850 Personal history of chimeric antigen receptor t-cell therapy: Secondary | ICD-10-CM | POA: Diagnosis not present

## 2023-10-22 DIAGNOSIS — B977 Papillomavirus as the cause of diseases classified elsewhere: Secondary | ICD-10-CM | POA: Diagnosis not present

## 2023-10-22 DIAGNOSIS — D801 Nonfamilial hypogammaglobulinemia: Secondary | ICD-10-CM | POA: Diagnosis not present

## 2023-10-22 DIAGNOSIS — Z7185 Encounter for immunization safety counseling: Secondary | ICD-10-CM | POA: Diagnosis not present

## 2023-10-22 DIAGNOSIS — D849 Immunodeficiency, unspecified: Secondary | ICD-10-CM | POA: Diagnosis not present

## 2023-10-22 DIAGNOSIS — C9000 Multiple myeloma not having achieved remission: Secondary | ICD-10-CM | POA: Diagnosis not present

## 2023-10-26 DIAGNOSIS — G4700 Insomnia, unspecified: Secondary | ICD-10-CM | POA: Diagnosis not present

## 2023-10-26 DIAGNOSIS — E785 Hyperlipidemia, unspecified: Secondary | ICD-10-CM | POA: Diagnosis not present

## 2023-10-26 DIAGNOSIS — Z7985 Long-term (current) use of injectable non-insulin antidiabetic drugs: Secondary | ICD-10-CM | POA: Diagnosis not present

## 2023-10-26 DIAGNOSIS — M2550 Pain in unspecified joint: Secondary | ICD-10-CM | POA: Diagnosis not present

## 2023-10-26 DIAGNOSIS — I10 Essential (primary) hypertension: Secondary | ICD-10-CM | POA: Diagnosis not present

## 2023-10-26 DIAGNOSIS — C9001 Multiple myeloma in remission: Secondary | ICD-10-CM | POA: Diagnosis not present

## 2023-10-26 DIAGNOSIS — E559 Vitamin D deficiency, unspecified: Secondary | ICD-10-CM | POA: Diagnosis not present

## 2023-10-26 DIAGNOSIS — J309 Allergic rhinitis, unspecified: Secondary | ICD-10-CM | POA: Diagnosis not present

## 2023-10-26 DIAGNOSIS — N62 Hypertrophy of breast: Secondary | ICD-10-CM | POA: Diagnosis not present

## 2023-10-29 ENCOUNTER — Other Ambulatory Visit: Payer: Self-pay

## 2023-10-29 DIAGNOSIS — C9000 Multiple myeloma not having achieved remission: Secondary | ICD-10-CM

## 2023-10-30 ENCOUNTER — Inpatient Hospital Stay: Attending: Hematology

## 2023-10-30 ENCOUNTER — Inpatient Hospital Stay: Admitting: Hematology

## 2023-10-30 VITALS — BP 132/80 | HR 65 | Temp 97.5°F | Resp 17 | Ht 66.5 in | Wt 150.3 lb

## 2023-10-30 DIAGNOSIS — Z803 Family history of malignant neoplasm of breast: Secondary | ICD-10-CM | POA: Diagnosis not present

## 2023-10-30 DIAGNOSIS — I10 Essential (primary) hypertension: Secondary | ICD-10-CM | POA: Diagnosis not present

## 2023-10-30 DIAGNOSIS — R7303 Prediabetes: Secondary | ICD-10-CM | POA: Insufficient documentation

## 2023-10-30 DIAGNOSIS — C9001 Multiple myeloma in remission: Secondary | ICD-10-CM | POA: Diagnosis not present

## 2023-10-30 DIAGNOSIS — C9000 Multiple myeloma not having achieved remission: Secondary | ICD-10-CM

## 2023-10-30 LAB — CBC WITH DIFFERENTIAL (CANCER CENTER ONLY)
Abs Immature Granulocytes: 0 K/uL (ref 0.00–0.07)
Basophils Absolute: 0 K/uL (ref 0.0–0.1)
Basophils Relative: 0 %
Eosinophils Absolute: 0.1 K/uL (ref 0.0–0.5)
Eosinophils Relative: 1 %
HCT: 37.3 % (ref 36.0–46.0)
Hemoglobin: 13.3 g/dL (ref 12.0–15.0)
Immature Granulocytes: 0 %
Lymphocytes Relative: 17 %
Lymphs Abs: 0.8 K/uL (ref 0.7–4.0)
MCH: 30.9 pg (ref 26.0–34.0)
MCHC: 35.7 g/dL (ref 30.0–36.0)
MCV: 86.7 fL (ref 80.0–100.0)
Monocytes Absolute: 0.4 K/uL (ref 0.1–1.0)
Monocytes Relative: 8 %
Neutro Abs: 3.5 K/uL (ref 1.7–7.7)
Neutrophils Relative %: 74 %
Platelet Count: 211 K/uL (ref 150–400)
RBC: 4.3 MIL/uL (ref 3.87–5.11)
RDW: 14.3 % (ref 11.5–15.5)
WBC Count: 4.7 K/uL (ref 4.0–10.5)
nRBC: 0 % (ref 0.0–0.2)

## 2023-10-30 LAB — CMP (CANCER CENTER ONLY)
ALT: 8 U/L (ref 0–44)
AST: 15 U/L (ref 15–41)
Albumin: 4.5 g/dL (ref 3.5–5.0)
Alkaline Phosphatase: 59 U/L (ref 38–126)
Anion gap: 6 (ref 5–15)
BUN: 9 mg/dL (ref 6–20)
CO2: 31 mmol/L (ref 22–32)
Calcium: 9.7 mg/dL (ref 8.9–10.3)
Chloride: 105 mmol/L (ref 98–111)
Creatinine: 0.77 mg/dL (ref 0.44–1.00)
GFR, Estimated: >60 mL/min (ref >60)
Glucose, Bld: 73 mg/dL (ref 70–99)
Potassium: 4 mmol/L (ref 3.5–5.1)
Sodium: 142 mmol/L (ref 135–145)
Total Bilirubin: 0.8 mg/dL (ref 0.0–1.2)
Total Protein: 7 g/dL (ref 6.5–8.1)

## 2023-10-30 NOTE — Progress Notes (Signed)
 HEMATOLOGY/ONCOLOGY CLINIC NOTE  Date of Service: 10/30/23   Patient Care Team: Alexandra Speaks, FNP as PCP - General (General Practice)  CHIEF COMPLAINTS/PURPOSE OF CONSULTATION:  Follow-up for continued evaluation and management of multiple myeloma.  HISTORY OF PRESENTING ILLNESS:   Please see previous note for details  INTERVAL HISTORY:  Alexandra [name] is a 58 y.o. female here for continued evaluation and management of her multiple myeloma.  Patient was last seen by me on 07/09/2023  Patient notes that she has moved close to [place] in [place] and has been doing well.  No new focal bone pains.  No new headaches.  No new lumps or bumps.  No fevers chills night sweats. Working from home at this time. 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 Notes that she has recently seen her plastic surgeon Dr. Leora at [facility] for follow-up after her reduction mammoplasty's and to evaluate some skin thickening which was thought to be scar tissue from surgery. She has been taking semaglutide  and has reached her desired weight and is now on a maintenance dosing. She was seen by Dr. Oscar for post CAR-T cell follow-up in August 2025.  She will be completing 2 years post CAR-T cell therapy in December of this year. She had a bone marrow biopsy on 10/14/2023 at [facility].   ONCOLOGIC HISTORY   Lambda Light Chain Multiple Myeloma  A. 09/27/19: CT Maxillofacial enhancing dural-based anterior cranial fossa mass traversing the calvarium and extending into the extracalvarial soft tissues along with a 7.4 mm rightward midline shift at the level of anterior cranial fossa. MRI head with and without contrast is recommended for further evaluation. B. 10/05/19: MRI Head large extra-axial mass anterior left frontal region with bone erosion and extension into the scalp soft tissues. Considerations include hemangiopericytoma, metastasis, and aggressive hemangioma. 2.6 mm enhancing lesion in the clivus.  C. 10/10/19: Left  craniectomy with frontal brain tumor/bone resection, path + plasma cell neoplasm, lambda restricted, plasmacytoma favored. Hgb 9.9,  D. 10/31/19: Initial Hem-Onc consultation; M-spike 0; IgG 627, IgQ 51, IgM 11; SFLC: Kappa 1.12 mg/dL, Lambda 845.49, ratio 9.98; IFE: serum + monoclonal free Lambda light chain; Hgb 10.1,  E. 11/04/19: 24 hour UPEP Total protein 1318, Free lambda light chains Ur 1332.46, ratio 0.02, M-spike 27.4, M-spike 24 hr at 361; Bence Jones positive, Lambda type F. 11/08/19: BMBx 22% atypical plasma cells, 30% by IHC; FISH Dup(1q) and Del(13q) detected G. 11/10/19: PET CT no metabolic activity within the skeleton to localize active myeloma; no lytic lesions identified; no abnormal activity at the left craniectomy site; no evidence of soft tissue plasmacytoma. H. 12/05/19: Started KRD; SFLC: Kappa 1.42, Lambda 177.97, ratio 0.01; Hgb 11.0 I. 12//13/21: SFLC: Kappa 1.45, Lambda 8.92, ratio 0.16; Hgb 11.1 J. 02/06/20: SFLC: Kappa 1.10, Lambda 2.08, ratio 0.53; Hgb 11.3 K. 02/26/20: Cycle 4 of KRD; SFLC: Kappa 1.14, Lambda 1.90, ratio 0.60; Hgb 10.8 L. 04/02/20: SFLC: Kappa 1.05, Lambda 1.56, ratio 0.67; Hgb 11.2 -. 04/30/20: SFLC: Kappa 1.01, Lambda 1.40, ratio 0.72; Hgb 11.5  -. 05/05/20: MRI Brain no evidence of left frontal bone lesion recurrence; Multiple subcentimeter area of nodular enhancement in the calvarium are nonspecific but could related to myeloma given nonvisualization on prior. -. 05/11/20: PET CT no findings to suggest FDG avid lesions of multiple myeloma. -. 05/14/20: BMBx 1% plasma cells -. 05/28/20: SFLC: Kappa 0.84, Lambda 1.16, ratio 0.72; Hgb 11.6  -. 06/11/20: EKG QT 408/QTc 424; PFT's: FEV1 110%, DLCO 58.8% predicted, 64.4% hgb corrected; ECHO:  EF 55% est, 61% calc; GLS -20%; SFLC: Kappa 1.16, Lambda 1.40, ratio 0.83; Hgb 10.9-VGPR -. 07/06/20: SFLC: Kappa 1.16, Lambda 2.14, ratio 0.54; . Hgb 11.7, Plt 118 -. 6/20-7/12/22: Hospital admission for Melphalan 200 mg/m2  followed by reinfusion of 4.71 x 10^6/kg CD34+ autologous cells -. 08/22/20: M-spike 0; SFLC wnl; IFE IgG kappa w/ co-migrating lambda light chain. -. 09/26/20: M-spike 0; SFLC wnl; IFE faint IgG lambda component. - 10/22: Started maintenance Revlimid  10 mg po qd 21/28 -. 01/18/21: BMBx negative for plasma cell neoplasm; M-spike 0; SFLC wnl; PET CT no abnormal foci of increased uptake within the skeleton or lymph nodes to suggest metabolically active multiple myeloma; MRI Brain scattered sub-centimeter calvarium enhancing lesions are stable but suspicious for myeloma. Still, a larger enhancing lesion at the clivus seen in 2021 remains resolved. No evidence of metastatic disease to the brain.  - 01/22/21: 24 hour urine 140 mg/24 hr, negative for monoclonal protein - 03/07/21: M-spike 0; SFLC wnl; IFE negative -. 03/21/21: Due to aggressive nature of pt's MM, Carfilzomib  added every other week to Revlimid  maintenance - 04/04/21: Developed new lump growing on left forehead shortly after Kyprolis  start with headache -. 05/03/21: MRI Brain recurrent tumor anterior left temporalis, along the inferior margin of craniectomy flap. Close association with the lateral wall left orbit without evidence of invasion. No evidence of intracranial spread.  - 05/24/21: PET CT hypermetabolic soft tissue nodule along anterior aspect of the left temporalis muscle, suspicious for plasmacytoma recurrence. No abnormal foci of increased uptake within the skeleton to suggest metabolically active osseous multiple myeloma involvement; M-spike 0; Lambda 3.72. -: 06/03/21: XRT Left temple mass -. 06/18/21: Therapy changed to Sarclisa  as prescribed and Pomalyst  3 mg po qd 21/28 due to recurrent plasmacytoma -. 10/14/21: No M-spike; SFLC wnl; IFE faint IgG kappa; BMBx 3% abnormal plasma cells on aspirate; FISH unable to result; normal female karyotype; PET scan No definitive hypermetabolic disease - 10/30/21: M-spike 0.1; Lambda 2.74 -. 12/19/21:  M-spike 0; Lambda 8.62 -. 01/08/22: No M-spike pending; SFLC kappa 0.93, lambda 25.18, ratio 0.04; IFE with lambda light chain in the beta region which cannot be quantified due to overlap with background beta globulins; a region of constricted electrophoretic mobility is present in IgG kappa. 01/08/22: Echo EF 66%, GLS -21.9% -. 01/09/2022 - 01/11/2022: Fludarabine (30 mg/m2) 60 mg IV and Cytoxan (300 mg/m2) 600 mg IV on Day -5 thru Day -3. -. 01/14/2022: Abecma infusion 426.16 x 10^6 anti-[id number] CAR+ T cells (per [facility]ec[facility] COA document) given 9100/10B DCT. -. 3. CAR-T Toxicities (date/grade): 01/16/23 CRS G1 First temp spike 12/27 in early AM, Tmax 39.4. FFWU grossly negative. She met criteria for CRS G1 and received 2 doses of Toci (12/28, 12/29). Cefepime (12/27-12/31) and Vancomycin (12/27-12/28). She was deescalated back to levaquin with no infectious source and defevesced fevers. Her last fever was on 12/29. All culture data negative. -. Post CAR-T severe joint pain. 07/21/2022 resolving and remaining symptoms could potentially be arthritis. -. 07/07/2022 bone marrow biopsy MRD negative FISH and karyotype pending. SPEP shows a M spike of 0.34 in the gamma region. IFE shows no clear monoclonal component, free light chains kappa 0.18 lambda 0.28 with a ratio of 0.64. Beta-2  1.92 -. 01/26/2023, SPEP no m spike, IFE IGG kappa electrophoretic mobility in IgG-kappa with a co-migrating lambda light chain, which is of unclear significance , SFLC Kappa0.73, Lambda 0.61, ratio 1.20,  CBC normal. -. 03/01/2022 IgG level was 514. Protein electrophoresis results were normal,  with no M spike detected. SFLC Kappa 0.59 mg/dL. Lambda 0.65mg /dL ratio 0.9.  - 5/0/7975 Labs reviewed-CBC Hgb 10.8 wbc 4 plts 199 Creatinine 0.7 calcium  9.1 LFTs wnl Sed rate 9 CRP 0.75 Ferritin 11 CD4 210 IgG 163 CMV negative MM labs Kappa <0.07 lambda <01.5 (down from 25.18) SPEP negative IFE neg -. 01/26/2023, SPEP no m spike, IFE IGG kappa  electrophoretic mobility in IgG-kappa with a co-migrating lambda light chain, which is of unclear significance , SFLC Kappa0.73, Lambda 0.61, ratio 1.20, CBC normal. -. 04/29/2023 showed normal blood counts, chemistries, and no M spike IFE no monoclonal component. Free light Kappa 7 lambda 7.9 mg/l ratio were normal. IgG 615. white count of 6.9, hemoglobin of 12.7, and platelets at 270.  MEDICAL HISTORY:  Past Medical History:  Diagnosis Date   Anxiety    Hypertension    Pre-diabetes     SURGICAL HISTORY: Past Surgical History:  Procedure Laterality Date   ABLATION     APPLICATION OF CRANIAL NAVIGATION N/A 10/10/2019   Procedure: APPLICATION OF CRANIAL NAVIGATION;  Surgeon: Lanis Pupa, MD;  Location: [facility] OR;  Service: Neurosurgery;  Laterality: N/A;  APPLICATION OF CRANIAL NAVIGATION   CRANIOPLASTY Left 10/14/2019   Procedure: LEFT CRANIOPLASTY WITH PLACEMENT OF CUSTOM CRANIAL IMPLANT;  Surgeon: Lanis Pupa, MD;  Location: [facility] OR;  Service: Neurosurgery;  Laterality: Left;   CRANIOTOMY Left 10/10/2019   Procedure: Left frontal Stereotactic craniectomy for resection of tumor;  Surgeon: Lanis Pupa, MD;  Location[facility];  Service: Neurosurgery;  Laterality: Left;  Left frontal Stereotactic craniectomy for resection of tumor   HAMMER TOE SURGERY Left    IR IMAGING GUIDED PORT INSERTION  12/01/2019   IR IMAGING GUIDED PORT INSERTION  08/06/2021   TUBAL LIGATION      SOCIAL HISTORY: Social History   Socioeconomic History   Marital status: Single    Spouse name: Not on file   Number of children: Not on file   Years of education: Not on file   Highest education level: Associate degree: academic program  Occupational History   Not on file  Tobacco Use   Smoking status: Never   Smokeless tobacco: Never  Vaping Use   Vaping status: Never Used  Substance and Sexual Activity   Alcohol use: Yes    Comment: occassionally   Drug use: Not Currently   Sexual activity: Not  on file  Other Topics Concern   Not on file  Social History Narrative   Not on file   Social Drivers of Health   Financial Resource Strain: Medium Risk (05/15/2022)   Overall Financial Resource Strain (CARDIA)    Difficulty of Paying Living Expenses: Somewhat hard  Food Insecurity: Food Insecurity Present (05/15/2022)   Hunger Vital Sign    Worried About Running Out of Food in the Last Year: Sometimes true    Ran Out of Food in the Last Year: Never true  Transportation Needs: No Transportation Needs (05/15/2022)   PRAPARE - Administrator, Civil Service (Medical): No    Lack of Transportation (Non-Medical): No  Physical Activity: Insufficiently Active (05/15/2022)   Exercise Vital Sign    Days of Exercise per Week: 1 day    Minutes of Exercise per Session: 30 min  Stress: Stress Concern Present (05/15/2022)   [employer] of Occupational Health - Occupational Stress Questionnaire    Feeling of Stress : To some extent  Social Connections: Moderately Isolated (05/15/2022)   Social Connection and Isolation Panel  Frequency of Communication with Friends and Family: Once a week    Frequency of Social Gatherings with Friends and Family: Once a week    Attends Religious Services: More than 4 times per year    Active Member of [employer] or Organizations: Yes    Attends Engineer, structural: More than 4 times per year    Marital Status: Divorced  Catering manager Violence: Not on file    FAMILY HISTORY: Family History  Problem Relation Age of Onset   Breast cancer Maternal Grandmother    Heart disease Mother    Hypertension Mother    Sarcoidosis Father    Diabetes Maternal Grandfather     ALLERGIES:  has no known allergies.  MEDICATIONS:  Current Outpatient Medications  Medication Sig Dispense Refill   atorvastatin  (LIPITOR) 10 MG tablet Take 1 tablet (10 mg total) by mouth daily. 90 tablet 1   benazepril -hydrochlorthiazide (LOTENSIN  HCT) 5-6.25 MG tablet  Take 1 tablet by mouth in the morning. 90 tablet 1   bimatoprost (LATISSE) 0.03 % ophthalmic solution      Cholecalciferol (VITAMIN D-3 PO) Take by mouth daily. (Patient not taking: Reported on 07/09/2023)     ECHINACEA PO Take by mouth.     levocetirizine (XYZAL ) 5 MG tablet Take 1 tablet (5 mg total) by mouth every evening. 90 tablet 3   Multiple Vitamins-Minerals (EMERGEN-C IMMUNE PO) Take by mouth daily. Two tablets     Semaglutide ,0.25 or 0.5MG /DOS, 2 MG/3ML SOPN Inject 0.4 mg into the skin once a week.     traZODone  (DESYREL ) 50 MG tablet Take 1 - 2 tablets (50 - 100 mg total) by mouth at bedtime as needed for sleep 180 tablet 1   valACYclovir  (VALTREX ) 500 MG tablet Take 1 tablet by mouth 2 times a day 60 tablet 10   No current facility-administered medications for this visit.   Facility-Administered Medications Ordered in Other Visits  Medication Dose Route Frequency Provider Last Rate Last Admin   heparin  lock flush 100 unit/mL  500 Units Intracatheter Once Kale, Gautam Kishore, MD       sodium chloride  flush (NS) 0.9 % injection 10 mL  10 mL Intracatheter Once Kale, Gautam Kishore, MD        REVIEW OF SYSTEMS:   No infection issues .10 Point review of Systems was done is negative except as noted above.   PHYSICAL EXAMINATION: .LMP 07/09/2015  .BP 132/80 (BP Location: Right Arm, Patient Position: Sitting, Cuff Size: Normal)   Pulse 65   Temp (!) 97.5 F (36.4 C)   Resp 17   Ht 5' 6.5 (1.689 m)   Wt 150 lb 4.8 oz (68.2 kg)   LMP 07/09/2015   SpO2 96%   BMI 23.90 kg/m   GENERAL:alert, in no acute distress and comfortable SKIN: no acute rashes, no significant lesions EYES: conjunctiva are pink and non-injected, sclera anicteric OROPHARYNX: MMM, no exudates, no oropharyngeal erythema or ulceration NECK: supple, no JVD LYMPH:  no palpable lymphadenopathy in the cervical, axillary or inguinal regions LUNGS: clear to auscultation b/l with normal respiratory effort HEART:  regular rate & rhythm ABDOMEN:  normoactive bowel sounds , non tender, not distended. Extremity: no pedal edema PSYCH: alert & oriented x 3 with fluent speech NEURO: no focal motor/sensory deficits   LABORATORY DATA:  I have reviewed the data as listed     Latest Ref Rng & Units 10/30/2023    8:53 AM 05/25/2023    2:18 PM 04/29/2023  1:55 PM  CBC  WBC 4.0 - 10.5 K/uL 4.7  5.5  6.6   Hemoglobin 12.0 - 15.0 g/dL 86.6  86.7  87.2   Hematocrit 36.0 - 46.0 % 37.3  37.5  37.1   Platelets 150 - 400 K/uL 211  275  270        Latest Ref Rng & Units 10/30/2023    8:53 AM 05/25/2023    2:18 PM 04/29/2023    1:55 PM  CMP  Glucose 70 - 99 mg/dL 73  94  90   BUN 6 - 20 mg/dL 9  10  14    Creatinine 0.44 - 1.00 mg/dL 9.22  9.04  9.29   Sodium 135 - 145 mmol/L 142  140  141   Potassium 3.5 - 5.1 mmol/L 4.0  3.4  3.9   Chloride 98 - 111 mmol/L 105  105  104   CO2 22 - 32 mmol/L 31  28  29    Calcium  8.9 - 10.3 mg/dL 9.7  9.7  89.9   Total Protein 6.5 - 8.1 g/dL 7.0  7.1  7.3   Total Bilirubin 0.0 - 1.2 mg/dL 0.8  0.6  0.5   Alkaline Phos 38 - 126 U/L 59  54  53   AST 15 - 41 U/L 15  17  15    ALT 0 - 44 U/L 8  10  9      11/08/2019 FISH Plasma Cell Myeloma Prognostic Panel:    11/08/2019 Cytogenetics:    11/08/2019 Bone Marrow Report [phone]):   10/10/2019 Surgical Pathology [phone]):   05/14/2020 Surgical Pathology -Normocellular bone marrow for age with trilineage hematopoiesis and 1%  plasma cells  -See comment   PERIPHERAL BLOOD:  -Normocytic-normochromic anemia  -Leukopenia   COMMENT:   The bone marrow is generally normocellular for age with trilineage  hematopoiesis but with predominance of erythroid precursors.  The  myeloid changes are not considered specific and likely related to  therapy.  In this background, the plasma cells represent 1% of all cells  associated with scattered small clusters and display slight lambda light  chain excess.  The  findings are limited but most suggestive of minimal  residual plasma cell neoplasm.  Correlation with cytogenetic and FISH  studies is recommended.       Bone marrow biopsy on 10/14/2021:  DIAGNOSIS    A-C. Bone marrow, aspirate smear, touch prep, clot section, core biopsy and peripheral blood smear: - Plasma cell neoplasm, based on 3% atypical plasma cells on aspirate smear and MRD flow cytometry results with 0.2% abnormal lambda-monotypic plasma cells - Aspirate smear with erythroid predominance, no increase in blasts - Core biopsy and clot section limited for evaluation; touch prep unsatisfactory for evaluation - Congo Red stain negative for amyloid deposition - See comment  Electronically signed by Janina Dal Shape, MD on 10/17/2021 at  6:35 PM  Diagnostic Comment    Corresponding flow cytometry analysis, [id number], shows a plasma cell population with aberrant immunophenotype comprising 0.2% of analyzed events, supporting the diagnosis above.   DIAGNOSIS   A. Bone marrow, flow cytometric analysis for multiple myeloma minimal residual disease detection:    Negative. No phenotypically abnormal plasma cells at or above the limit of detection identified.     No monotypic B-cell population identified. See comment.   Comment: See corresponding surgical pathology report, [id number] for final integrated diagnosis. Flow cytometry often underestimates the total number of plasma cells.      RADIOGRAPHIC STUDIES:  I have personally reviewed the radiological images as listed and agreed with the findings in the report. MR Brain W Wo Contrast Result Date: 10/06/2023 EXAM: MRI BRAIN WITH AND WITHOUT CONTRAST 10/02/2023 05:14:55 PM TECHNIQUE: Multiplanar multisequence MRI of the head/brain was performed with and without the administration of intravenous contrast. 7mL gadobutrol  (GADAVIST ) 1 MMOL/ML injection 7 mL GADOBUTROL  1 MMOL/ML IV SOLN. COMPARISON: MRI of the head dated 03/24/2023.  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 FINDINGS: BRAIN AND VENTRICLES: No acute infarct. No acute intracranial hemorrhage. No mass effect or midline shift. No hydrocephalus. The sella is unremarkable. Normal flow voids. No mass or abnormal enhancement. The patient is again noted to be status post frontal craniotomy/cranioplasty. There is a stable appearance of the bone flap. There are mild focal encephalomalacia changes present anteriorly within the left frontal lobe with subcortical gliosis, as before. There is mild-to-moderate periventricular and subcortical cerebral white matter disease, also as before. There is no adverse interval change in the appearance of the brain or calvarium. ORBITS: No acute abnormality. SINUSES: No acute abnormality. BONES AND SOFT TISSUES: Normal bone marrow signal and enhancement. No acute soft tissue abnormality. The patient is again noted to be status post frontal craniotomy/cranioplasty. There is a stable appearance of the bone flap. IMPRESSION: 1. No acute intracranial abnormality and no adverse interval change. 2. Stable post-frontal craniotomy/cranioplasty changes and mild focal encephalomalacia in the left frontal lobe with subcortical gliosis. 3. Mild-to-moderate periventricular and subcortical cerebral white matter disease, stable. Electronically signed by: Evalene Coho MD 10/06/2023 01:01 PM EDT RP Workstation: [id number]     06/29/2023 PET SCAN:   IMPRESSION:  1. No evidence of hypermetabolic myeloma.  2.  Mild asymmetric activity in the right lower lobe associated with  bronchial wall thickening, likely infectious or inflammatory. Attention on  follow up.   Electronically Reviewed by:  Delon LITTIE Sharps, MD, [facility]  Electronically Reviewed on:  06/29/2023 12:41 PM   PET scan on 10/14/2021: IMPRESSION:   1.  No  definite hypermetabolic disease.   2.  Indeterminate focus of hypermetabolic activity in the right proximal  arm musculature which is favored posttraumatic or related to spasm. This is  without CT correlate, but assessment is limited given lack of IV contrast.  Recommend attention on follow-up given the focal nature of this activity.    [facility] Outside Information   Pathology - Bone Marrow 10/15/2023  Component 3 wk ago  Case Report Surgical Pathology                                Case: [id number]                               Authorizing Provider:  Crisoforo Levander Rouse, [place]            Collected:           10/14/2023 0815             Ordering Location:     [facility] and   Received:            10/14/2023 9058  [facility]                                                   Pathologist:           Sophie Edsel Peri Margarite, MD                                                       Specimens:   A) - Bone Marrow, Aspirate                                                                        B) - Bone Marrow, Biopsy                                                                          C) - Bone Marrow, Clot                                                                DIAGNOSIS   A-C. Bone marrow, (aspirate smear, touch preparation, clot section, core biopsy):   Normocellular bone marrow for age (40% cellular) with maturing trilineage hematopoiesis and no increase in plasma cells (1% by differential count and <5% by CD138 immunostain). No morphologic or immunophenotypic evidence of plasma cell neoplasm. See comment.   Comment: Concurrent flow cytometry for minimal residual disease was negative, with no phenotypically abnormal plasma cells at or above the limit of detection identified. No monotypic B-cell population identified.   Plasma cells comprise of <5% by CD138 immunostain. Overall, there is no morphologic or immunophenotypic evidence of plasma cell neoplasm.  Correlation with karyotype and FISH studies is recommended.      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 No M spike.  Lambda light chain myeloma + Anemia + bone lesion on skull-- resected No renal failure, hypercalcemia  BMBx- 22-30% lambda restricted plasma cell MolCy Dup (1q), del (13q)  Completed 7 cycles of KRD  The pt recently had a transplant at [facility]. DISEASE Lambda Light Chain Multiple Myeloma  - She will receive Melphalan 200 mg/m2 conditioning regimen, followed by autologous stem cell rescue (see details below).  TRANSPLANT: Preparative regimen:  - Melphalan 200 mg/m2 (355mg ) IV: 07/09/20  Stem Cells:  - Autologous stem cell reinfusion on 07/10/20. Patient received 4.71 x 10^6 CD34+/kg. - Her course was complicated by neutropenic fevers and Bcx 6/27 positive for E coli. She received IV cefepime from 6/27 until 6/30, which cefepime was switched to zosyn in the setting of new abdominal pain. CT abdomen/pelvis was performed that showed acute uncomplicated multifocal diverticulitis involving the transverse and descending colon. She was treated with bowel rest, prn pain medication, and IV zosyn, which was continued until engraftment on 7/5 and was transitioned to ppx ceftriaxone. Unfortunately, with the transition to ceftriaxone she had recurrent fevers and as a result was restarted on zosyn. Zosyn was transitioned to oral cipro /flagyl  on 7/10 with the plan to continue for a 7 day course.    Bone marrow biopsy done on 01/18/2021 showed normocellular bone marrow with no  morphologic evidence of plasma cell neoplasm. 24-hour UPEP on 01/22/2021 showed unremarkable immunofixation pattern and no monoclonal protein. PET CT scan 01/16/2021-negative for FDG avid lesions of myeloma MRI brain 01/12/2021-showed no overt active myeloma lesions.  S/p CAR-T therapy - She received Flu/Cy lymph-depletion, followed by CAR-T ([facility]ec[facility]) infusion.  TRANSPLANT: Preparative Regimen:  - Fludarabine (30 mg/m2) 60 mg IV on 12/2121 - 1223/23 (Day -5 thru Day -3). - Cytoxan (300 mg/m2) 600 mg IV on 01/09/22 - 01/11/22 (Day -5 thru Day -3).  Stem Cells:  - CAR-T cell reinfusion on 01/14/22    3) prediabetes 4) hypertension 5) anxiety 6) dyslipidemia   PLAN: - Discussed lab results from 10/30/2023 CBC shows normal hemoglobin of 13.3 with normal WBC count of 4.7k normal platelets of 211k CMP is within normal limits Myeloma panel shows no notable M spike and negative IFE Serum kappa lambda free light chains within normal limits with normal ratio Recent bone marrow biopsy at [facility] on 10/15/2023 showed complete remission and MRD negative status. Recent PET/CT scan in June 2025 at [facility] showed no evidence of active myeloma. MRI brain done on 10/02/2023 shows no evidence of acute intracranial abnormality.  Normal bone marrow signal and enhancement.  Stable post frontal craniotomy/cranioplasty changes with mild focal encephalomalacia in the left frontal lobe with subcortical gliosis.  Patient will continue to follow with Dr. Vickie Devon for post CAR-T cell therapy cares.  And she has a next appointment the next 2 to 3 months. We shall see her back in 6 months with repeat labs.  FOLLOW-UP: Return to clinic with Dr. Onesimo with labs in 6 months   The total time spent in the appointment was 32 minutes*.  All of the patient's questions were answered with apparent satisfaction. The patient knows to call the clinic with any problems, questions or concerns.   Alexandra Onesimo MD MS AAHIVMS [facility]  [facility] Hematology/Oncology Physician [facility]  .*Total Encounter Time as defined by the Centers for Medicare and Medicaid Services includes, in addition to the face-to-face time of a patient visit (documented in the note above) non-face-to-face time: obtaining and reviewing outside history, ordering and reviewing medications, tests or  procedures, care coordination (communications with other health care professionals or caregivers) and documentation in the medical record.    I, Alexandra [name],acting as a scribe for Alexandra Saran, MD.,have documented all relevant documentation on the behalf of Alexandra Saran, MD,as directed by  Alexandra Saran, MD while in the presence of Alexandra Saran, MD.  .I have reviewed the above documentation for accuracy and completeness, and I agree with the above. .Gautam Kishore Kale MD

## 2023-11-02 LAB — KAPPA/LAMBDA LIGHT CHAINS
Kappa free light chain: 8 mg/L (ref 3.3–19.4)
Kappa, lambda light chain ratio: 0.85 (ref 0.26–1.65)
Lambda free light chains: 9.4 mg/L (ref 5.7–26.3)

## 2023-11-03 DIAGNOSIS — C9001 Multiple myeloma in remission: Secondary | ICD-10-CM | POA: Diagnosis not present

## 2023-11-04 LAB — MULTIPLE MYELOMA PANEL, SERUM
Albumin SerPl Elph-Mcnc: 3.9 g/dL (ref 2.9–4.4)
Albumin/Glob SerPl: 1.5 (ref 0.7–1.7)
Alpha 1: 0.3 g/dL (ref 0.0–0.4)
Alpha2 Glob SerPl Elph-Mcnc: 0.8 g/dL (ref 0.4–1.0)
B-Globulin SerPl Elph-Mcnc: 1 g/dL (ref 0.7–1.3)
Gamma Glob SerPl Elph-Mcnc: 0.6 g/dL (ref 0.4–1.8)
Globulin, Total: 2.7 g/dL (ref 2.2–3.9)
IgA: 53 mg/dL — ABNORMAL LOW (ref 87–352)
IgG (Immunoglobin G), Serum: 659 mg/dL (ref 586–1602)
IgM (Immunoglobulin M), Srm: 64 mg/dL (ref 26–217)
Total Protein ELP: 6.6 g/dL (ref 6.0–8.5)

## 2023-11-09 ENCOUNTER — Encounter: Payer: Self-pay | Admitting: Hematology

## 2023-11-13 ENCOUNTER — Ambulatory Visit

## 2023-11-13 DIAGNOSIS — Z0000 Encounter for general adult medical examination without abnormal findings: Secondary | ICD-10-CM

## 2023-11-13 NOTE — Patient Instructions (Signed)

## 2023-11-13 NOTE — Progress Notes (Signed)
 Subjective:   Alexandra [name] is a 58 y.o. female who presents for Medicare Annual (Subsequent) preventive examination.  Visit Complete: Virtual I connected with  Alexandra [name] on 11/13/23 by a audio enabled telemedicine application and verified that I am speaking with the correct person using two identifiers.  Patient Location: Home  Provider Location: Office/Clinic  I discussed the limitations of evaluation and management by telemedicine. The patient expressed understanding and agreed to proceed.  Vital Signs: Because this visit was a virtual/telehealth visit, some criteria may be missing or patient reported. Any vitals not documented were not able to be obtained and vitals that have been documented are patient reported.  Patient Medicare AWV questionnaire was completed by the patient on 11/12/23; I have confirmed that all information answered by patient is correct and no changes since this date.  Cardiac Risk Factors include: hypertension     Objective:    Today's Vitals   11/13/23 1005  PainSc: 5    There is no height or weight on file to calculate BMI.     10/30/2023    9:25 AM 04/01/2023    9:26 AM 08/02/2022   11:46 PM 04/01/2022   10:06 AM 12/31/2021   10:37 AM 12/04/2021    4:14 PM 09/20/2021    8:06 AM  Advanced Directives  Does Patient Have a Medical Advance Directive? No;Yes Yes No Yes Yes Yes Yes  Type of Estate agent of [place];Living will   Living will;Healthcare Power of Attorney   Living will;Healthcare Power of Attorney  Does patient want to make changes to medical advance directive? No - Patient declined        Would patient like information on creating a medical advance directive? No - Patient declined  No - Patient declined        Current Medications (verified) Outpatient Encounter Medications as of 11/13/2023  Medication Sig   atorvastatin  (LIPITOR) 10 MG tablet Take 1 tablet (10 mg total) by mouth daily.    benazepril -hydrochlorthiazide (LOTENSIN  HCT) 5-6.25 MG tablet Take 1 tablet by mouth in the morning.   bimatoprost (LATISSE) 0.03 % ophthalmic solution    Cholecalciferol (VITAMIN D-3 PO) Take by mouth daily.   ECHINACEA PO Take by mouth.   fluconazole  (DIFLUCAN ) 100 MG tablet Take by mouth.   GARDASIL 9 0.5 ML prefilled syringe    ibuprofen (ADVIL) 800 MG tablet Take 800 mg by mouth 2 (two) times daily as needed.   levocetirizine (XYZAL ) 5 MG tablet Take 1 tablet (5 mg total) by mouth every evening.   Multiple Vitamins-Minerals (EMERGEN-C IMMUNE PO) Take by mouth daily. Two tablets   Semaglutide ,0.25 or 0.5MG /DOS, 2 MG/3ML SOPN Inject 0.4 mg into the skin once a week.   SPIKEVAX syringe    traZODone  (DESYREL ) 50 MG tablet Take 1 - 2 tablets (50 - 100 mg total) by mouth at bedtime as needed for sleep   valACYclovir  (VALTREX ) 500 MG tablet Take 1 tablet by mouth 2 times a day   azithromycin  (ZITHROMAX ) 250 MG tablet Take by mouth. (Patient not taking: Reported on 11/13/2023)   Facility-Administered Encounter Medications as of 11/13/2023  Medication   heparin  lock flush 100 unit/mL   sodium chloride  flush (NS) 0.9 % injection 10 mL    Allergies (verified) Patient has no known allergies.   History: Past Medical History:  Diagnosis Date   Anxiety    Hypertension    Pre-diabetes    Past Surgical History:  Procedure Laterality Date  ABLATION     APPLICATION OF CRANIAL NAVIGATION N/A 10/10/2019   Procedure: APPLICATION OF CRANIAL NAVIGATION;  Surgeon: Lanis Pupa, MD;  Location: [facility] OR;  Service: Neurosurgery;  Laterality: N/A;  APPLICATION OF CRANIAL NAVIGATION   CRANIOPLASTY Left 10/14/2019   Procedure: LEFT CRANIOPLASTY WITH PLACEMENT OF CUSTOM CRANIAL IMPLANT;  Surgeon: Lanis Pupa, MD;  Location: [facility] OR;  Service: Neurosurgery;  Laterality: Left;   CRANIOTOMY Left 10/10/2019   Procedure: Left frontal Stereotactic craniectomy for resection of tumor;  Surgeon: Lanis Pupa, MD;  Location: [facility] OR;  Service: Neurosurgery;  Laterality: Left;  Left frontal Stereotactic craniectomy for resection of tumor   HAMMER TOE SURGERY Left    IR IMAGING GUIDED PORT INSERTION  12/01/2019   IR IMAGING GUIDED PORT INSERTION  08/06/2021   TUBAL LIGATION     Family History  Problem Relation Age of Onset   Breast cancer Maternal Grandmother    Heart disease Mother    Hypertension Mother    Sarcoidosis Father    Diabetes Maternal Grandfather    Social History   Socioeconomic History   Marital status: Single    Spouse name: Not on file   Number of children: Not on file   Years of education: Not on file   Highest education level: Associate degree: academic program  Occupational History   Not on file  Tobacco Use   Smoking status: Never   Smokeless tobacco: Never  Vaping Use   Vaping status: Never Used  Substance and Sexual Activity   Alcohol use: Yes    Comment: occassionally   Drug use: Not Currently   Sexual activity: Not on file  Other Topics Concern   Not on file  Social History Narrative   Not on file   Social Drivers of Health   Financial Resource Strain: Medium Risk (11/13/2023)   Overall Financial Resource Strain (CARDIA)    Difficulty of Paying Living Expenses: Somewhat hard  Food Insecurity: No Food Insecurity (11/13/2023)   Hunger Vital Sign    Worried About Running Out of Food in the Last Year: Never true    Ran Out of Food in the Last Year: Never true  Transportation Needs: No Transportation Needs (11/13/2023)   PRAPARE - Administrator, Civil Service (Medical): No    Lack of Transportation (Non-Medical): No  Physical Activity: Insufficiently Active (11/13/2023)   Exercise Vital Sign    Days of Exercise per Week: 3 days    Minutes of Exercise per Session: 30 min  Stress: Stress Concern Present (11/13/2023)   [employer] of Occupational Health - Occupational Stress Questionnaire    Feeling of Stress: To some extent   Social Connections: Moderately Integrated (11/13/2023)   Social Connection and Isolation Panel    Frequency of Communication with Friends and Family: More than three times a week    Frequency of Social Gatherings with Friends and Family: Once a week    Attends Religious Services: More than 4 times per year    Active Member of [employer] or Organizations: Yes    Attends Engineer, structural: More than 4 times per year    Marital Status: Divorced    Tobacco Counseling Counseling given: Not Answered   Clinical Intake:  Pre-visit preparation completed: Yes  Pain : 0-10 Pain Score: 5  Pain Type: Chronic pain Pain Location: Hip Pain Orientation: Right Pain Descriptors / Indicators: Sharp Pain Onset: More than a month ago Pain Frequency: Occasional Pain Relieving Factors: meloxicam   Pain Relieving Factors: meloxicam   Nutritional Risks: None Diabetes: No  How often do you need to have someone help you when you read instructions, pamphlets, or other written materials from your doctor or pharmacy?: 1 - Never What is the last grade level you completed in school?: 13th grade  Interpreter Needed?: No  Information entered by :: Leanna Hamid h   Activities of Daily Living    11/13/2023   10:07 AM  In your present state of health, do you have any difficulty performing the following activities:  Hearing? 0  Vision? 0  Difficulty concentrating or making decisions? 0  Walking or climbing stairs? 0  Dressing or bathing? 0  Doing errands, shopping? 0  Preparing Food and eating ? N  Using the Toilet? N  In the past six months, have you accidently leaked urine? N  Do you have problems with loss of bowel control? N  Managing your Medications? N  Managing your Finances? N  Housekeeping or managing your Housekeeping? N    Patient Care Team: Georgina Speaks, FNP as PCP - General (General Practice)  Indicate any recent Medical Services you may have received from other than [facility]  providers in the past year (date may be approximate).     Assessment:   This is a routine wellness examination for Zakiah.  Hearing/Vision screen No results found.   Goals Addressed   None    Depression Screen    10/30/2023    9:27 AM 04/14/2023   10:28 AM 12/24/2021    4:08 PM 09/05/2020    9:49 AM 05/03/2020    9:34 AM 04/27/2019    4:18 PM 02/01/2019    9:36 AM  PHQ 2/9 Scores  PHQ - 2 Score 0 0 0 0 0 0 0  PHQ- 9 Score  2         Fall Risk    11/13/2023   10:12 AM 05/15/2022    2:07 PM 12/24/2021    4:09 PM 04/27/2019    4:18 PM 02/01/2019    9:36 AM  Fall Risk   Falls in the past year? 0 1 0 1  0   Number falls in past yr: 0 0 0    Injury with Fall? 0 1 0    Risk for fall due to : No Fall Risks No Fall Risks No Fall Risks    Follow up Falls evaluation completed  Falls evaluation completed        Data saved with a previous flowsheet row definition    MEDICARE RISK AT HOME: Medicare Risk at Home Any stairs in or around the home?: No If so, are there any without handrails?: No Home free of loose throw rugs in walkways, pet beds, electrical cords, etc?: Yes Adequate lighting in your home to reduce risk of falls?: Yes Life alert?: Yes Use of a cane, walker or w/c?: No Grab bars in the bathroom?: No Shower chair or bench in shower?: No Elevated toilet seat or a handicapped toilet?: No   Cognitive Function:        11/13/2023   10:13 AM  6CIT Screen  What Year? 0 points  What month? 0 points  What time? 0 points  Count back from 20 0 points  Months in reverse 0 points  Repeat phrase 0 points  Total Score 0 points    Immunizations Immunization History  Administered Date(s) Administered   DTaP / HiB / IPV 05/22/2021, 09/18/2021   Hepatitis B, ADULT 05/22/2021, 09/18/2021  Influenza Inj Mdck Quad Pf 10/14/2018   Influenza Whole 10/27/2021   Influenza, Quadrivalent, Recombinant, Inj, Pf 10/08/2020   Influenza,inj,Quad PF,6+ Mos 10/23/2017, 10/11/2019    Influenza-Unspecified 10/15/2018, 09/24/2022   Janssen (J&J) SARS-COV-2 Vaccination 04/28/2019   Meningococcal Mcv4o 05/22/2021   Moderna Covid-19 Fall Seasonal Vaccine 50yrs & older 08/27/2023   Moderna Sars-Covid-2 Vaccination 11/24/2019   PFIZER Comirnaty(Gray Top)Covid-19 Tri-Sucrose Vaccine 10/03/2020, 10/31/2020   PNEUMOCOCCAL CONJUGATE-20 02/21/2021   Pfizer Covid-19 Vaccine Bivalent Booster 6yrs & up 02/21/2021   Pfizer(Comirnaty)Fall Seasonal Vaccine 12 years and older 11/03/2021, 09/24/2022   Pneumococcal Conjugate-13 05/22/2021   Tdap 11/03/2018   Zoster Recombinant(Shingrix) 09/04/2020, 11/06/2020    TDAP status: Up to date  Flu Vaccine status: Up to date  Pneumococcal vaccine status: Up to date  Covid-19 vaccine status: Completed vaccines  Qualifies for Shingles Vaccine? Yes   Zostavax completed Yes   Shingrix Completed?: Yes  Screening Tests Health Maintenance  Topic Date Due   Hepatitis B Vaccines 19-59 Average Risk (3 of 3 - 19+ 3-dose series) 11/22/2021   COVID-19 Vaccine (3 - Moderna risk series) 09/24/2023   Colonoscopy  01/06/2024   Cervical Cancer Screening (HPV/Pap Cotest)  04/30/2024   Medicare Annual Wellness (AWV)  11/12/2024   Mammogram  07/09/2025   DTaP/Tdap/Td (4 - Td or Tdap) 09/19/2031   Pneumococcal Vaccine: 50+ Years  Completed   Influenza Vaccine  Completed   Hepatitis C Screening  Completed   HIV Screening  Completed   Zoster Vaccines- Shingrix  Completed   HPV VACCINES  Aged Out   Meningococcal B Vaccine  Aged Out    Health Maintenance  Health Maintenance Due  Topic Date Due   Hepatitis B Vaccines 19-59 Average Risk (3 of 3 - 19+ 3-dose series) 11/22/2021   COVID-19 Vaccine (3 - Moderna risk series) 09/24/2023   Colonoscopy  01/06/2024    Mammogram status: Completed 07/10/23. Repeat every year  Lung Cancer Screening: (Low Dose CT Chest recommended if Age 38-80 years, 20 pack-year currently smoking OR have quit w/in 15years.)  does not qualify.   Lung Cancer Screening Referral: n/a  Additional Screening:  Hepatitis C Screening: does qualify; Completed 11/06/21  Dental Screening: Recommended annual dental exams for proper oral hygiene   Community Resource Referral / Chronic Care Management: CRR required this visit?  No   CCM required this visit?  No     Plan:     I have personally reviewed and noted the following in the patient's chart:   Medical and social history Use of alcohol, tobacco or illicit drugs  Current medications and supplements including opioid prescriptions. Patient is not currently taking opioid prescriptions. Functional ability and status Nutritional status Physical activity Advanced directives List of other physicians Hospitalizations, surgeries, and ER visits in previous 12 months Vitals Screenings to include cognitive, depression, and falls Referrals and appointments  In addition, I have reviewed and discussed with patient certain preventive protocols, quality metrics, and best practice recommendations. A written personalized care plan for preventive services as well as general preventive health recommendations were provided to patient.     Richerd ONEIDA Lemmings, CMA   11/13/2023   After Visit Summary: (MyChart) Due to this being a telephonic visit, the after visit summary with patients personalized plan was offered to patient via MyChart   Nurse Notes: Richerd Lemmings, CMA

## 2023-11-19 DIAGNOSIS — G4700 Insomnia, unspecified: Secondary | ICD-10-CM | POA: Diagnosis not present

## 2023-11-19 DIAGNOSIS — E559 Vitamin D deficiency, unspecified: Secondary | ICD-10-CM | POA: Diagnosis not present

## 2023-11-19 DIAGNOSIS — Z113 Encounter for screening for infections with a predominantly sexual mode of transmission: Secondary | ICD-10-CM | POA: Diagnosis not present

## 2023-11-19 DIAGNOSIS — C9001 Multiple myeloma in remission: Secondary | ICD-10-CM | POA: Diagnosis not present

## 2023-11-19 DIAGNOSIS — M2550 Pain in unspecified joint: Secondary | ICD-10-CM | POA: Diagnosis not present

## 2023-11-19 DIAGNOSIS — I10 Essential (primary) hypertension: Secondary | ICD-10-CM | POA: Diagnosis not present

## 2023-11-19 DIAGNOSIS — E785 Hyperlipidemia, unspecified: Secondary | ICD-10-CM | POA: Diagnosis not present

## 2023-11-19 DIAGNOSIS — J309 Allergic rhinitis, unspecified: Secondary | ICD-10-CM | POA: Diagnosis not present

## 2023-11-19 DIAGNOSIS — Z7985 Long-term (current) use of injectable non-insulin antidiabetic drugs: Secondary | ICD-10-CM | POA: Diagnosis not present

## 2023-11-19 DIAGNOSIS — N760 Acute vaginitis: Secondary | ICD-10-CM | POA: Diagnosis not present

## 2023-12-02 ENCOUNTER — Other Ambulatory Visit: Payer: Self-pay | Admitting: Radiation Therapy

## 2023-12-02 DIAGNOSIS — C9031 Solitary plasmacytoma in remission: Secondary | ICD-10-CM

## 2023-12-09 DIAGNOSIS — E559 Vitamin D deficiency, unspecified: Secondary | ICD-10-CM | POA: Diagnosis not present

## 2023-12-09 DIAGNOSIS — J309 Allergic rhinitis, unspecified: Secondary | ICD-10-CM | POA: Diagnosis not present

## 2023-12-09 DIAGNOSIS — G4700 Insomnia, unspecified: Secondary | ICD-10-CM | POA: Diagnosis not present

## 2023-12-09 DIAGNOSIS — I10 Essential (primary) hypertension: Secondary | ICD-10-CM | POA: Diagnosis not present

## 2023-12-09 DIAGNOSIS — E785 Hyperlipidemia, unspecified: Secondary | ICD-10-CM | POA: Diagnosis not present

## 2023-12-09 DIAGNOSIS — M2550 Pain in unspecified joint: Secondary | ICD-10-CM | POA: Diagnosis not present

## 2023-12-09 DIAGNOSIS — C9001 Multiple myeloma in remission: Secondary | ICD-10-CM | POA: Diagnosis not present

## 2023-12-09 DIAGNOSIS — Z7985 Long-term (current) use of injectable non-insulin antidiabetic drugs: Secondary | ICD-10-CM | POA: Diagnosis not present

## 2023-12-15 DIAGNOSIS — M2550 Pain in unspecified joint: Secondary | ICD-10-CM | POA: Diagnosis not present

## 2023-12-15 DIAGNOSIS — I10 Essential (primary) hypertension: Secondary | ICD-10-CM | POA: Diagnosis not present

## 2023-12-15 DIAGNOSIS — G4700 Insomnia, unspecified: Secondary | ICD-10-CM | POA: Diagnosis not present

## 2023-12-15 DIAGNOSIS — K5730 Diverticulosis of large intestine without perforation or abscess without bleeding: Secondary | ICD-10-CM | POA: Diagnosis not present

## 2023-12-15 DIAGNOSIS — E559 Vitamin D deficiency, unspecified: Secondary | ICD-10-CM | POA: Diagnosis not present

## 2023-12-15 DIAGNOSIS — C9001 Multiple myeloma in remission: Secondary | ICD-10-CM | POA: Diagnosis not present

## 2023-12-15 DIAGNOSIS — E785 Hyperlipidemia, unspecified: Secondary | ICD-10-CM | POA: Diagnosis not present

## 2023-12-15 DIAGNOSIS — Z7985 Long-term (current) use of injectable non-insulin antidiabetic drugs: Secondary | ICD-10-CM | POA: Diagnosis not present

## 2023-12-15 DIAGNOSIS — Z1211 Encounter for screening for malignant neoplasm of colon: Secondary | ICD-10-CM | POA: Diagnosis not present

## 2023-12-15 DIAGNOSIS — J309 Allergic rhinitis, unspecified: Secondary | ICD-10-CM | POA: Diagnosis not present

## 2023-12-21 DIAGNOSIS — Z7985 Long-term (current) use of injectable non-insulin antidiabetic drugs: Secondary | ICD-10-CM | POA: Diagnosis not present

## 2023-12-21 DIAGNOSIS — I10 Essential (primary) hypertension: Secondary | ICD-10-CM | POA: Diagnosis not present

## 2023-12-21 DIAGNOSIS — M2550 Pain in unspecified joint: Secondary | ICD-10-CM | POA: Diagnosis not present

## 2023-12-21 DIAGNOSIS — E785 Hyperlipidemia, unspecified: Secondary | ICD-10-CM | POA: Diagnosis not present

## 2023-12-21 DIAGNOSIS — J309 Allergic rhinitis, unspecified: Secondary | ICD-10-CM | POA: Diagnosis not present

## 2023-12-21 DIAGNOSIS — E559 Vitamin D deficiency, unspecified: Secondary | ICD-10-CM | POA: Diagnosis not present

## 2023-12-21 DIAGNOSIS — C9001 Multiple myeloma in remission: Secondary | ICD-10-CM | POA: Diagnosis not present

## 2023-12-21 DIAGNOSIS — G4700 Insomnia, unspecified: Secondary | ICD-10-CM | POA: Diagnosis not present

## 2024-01-05 DIAGNOSIS — I10 Essential (primary) hypertension: Secondary | ICD-10-CM | POA: Diagnosis not present

## 2024-01-05 DIAGNOSIS — B379 Candidiasis, unspecified: Secondary | ICD-10-CM | POA: Diagnosis not present

## 2024-01-05 DIAGNOSIS — T3695XA Adverse effect of unspecified systemic antibiotic, initial encounter: Secondary | ICD-10-CM | POA: Diagnosis not present

## 2024-01-05 DIAGNOSIS — N3000 Acute cystitis without hematuria: Secondary | ICD-10-CM | POA: Diagnosis not present

## 2024-01-05 DIAGNOSIS — R350 Frequency of micturition: Secondary | ICD-10-CM | POA: Diagnosis not present

## 2024-01-19 NOTE — Telephone Encounter (Signed)
 Lft mg ok to come 1/2 appt

## 2024-01-22 NOTE — Progress Notes (Signed)
 Subjective Patient ID: Alexandra [name] is a 59 y.o. female. [address] - AMBULATORY [facility] [place] [facility] [address] [place] [id number]  Chief Complaint  Patient presents with   left side abdominal pain   swelling left side around eye     The following information was reviewed by members of the visit team:  Tobacco  Allergies  Meds      HPI History of Present Illness The patient is a 59 year old female who presents to establish care.  #General She relocated from [place] at the end of 07/2023 and is seeking to establish care in her new location. She is a cancer survivor, having been diagnosed with multiple myeloma, which is currently in remission following CAR-T treatment. 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 She reports a floating sensation on the same side as the lesion, which occasionally swells, causing concern. Her oncology team is based in [place], with Dr. Sharlynn POUR. serving as her primary oncologist during her time there. Currently, she is under the care of Dr. Wanda Castor at [facility] in [place], approximately 2 hours away. She is not on any suppressive medication such as immunotherapy or chemotherapy but is taking Valtrex  for shingles. She undergoes lab tests every 3 months to monitor her lambda and kappa levels. She has experienced weight loss, which she attributes to GLP-1 injections, even though she has not received one in over a month. She maintains a healthy diet and experiences occasional pain, described as a pulling sensation, when bending over and sitting back up. She reports no feverish symptoms.  #Slow transit constipation She has a history of constipation, with bowel movements occurring every 3 to 4 days, and describes her stools as small  balls. She was scheduled for a colonoscopy last Monday but had to cancel due to influenza. She is now seeking a gastroenterologist as her previous doctor does not accept [facility]. She reports no history of reflux.  #HCM Her last Pap smear was conducted in 04/2023. 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 She has selected a new gynecologist but has not yet had a consultation.  Social History: Diet: She maintains a healthy diet.  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    Current Outpatient Medications  Medication Instructions   atorvastatin  (LIPITOR) 10 mg tablet TAKE 1 TABLET BY MOUTH EVERY DAY   benazepriL -hydrochlorothiazide  (LOTENSIN  HCT) 20-12.5 mg tablet TAKE 1 TABLET BY MOUTH EVERY DAY   ECHINACEA ORAL Take by mouth.   estradioL (ESTRACE) 0.01 % (0.1 mg/gram) vaginal cream    levocetirizine (XYZAL ) 5 mg tablet    melatonin 10 mg cap Take by mouth.   meloxicam  (MOBIC ) 15 mg, Daily   valACYclovir  (VALTREX ) 500 mg   zolpidem  (AMBIEN ) 10 mg, oral, At  bedtime PRN     Objective Vitals:   01/22/24 1330  BP: 100/68  Pulse: 69   Review of Systems  Constitutional:  Negative for activity change, appetite change, chills and fever.  HENT:  Negative for congestion, ear discharge, ear pain, hearing loss, sinus pressure and sinus pain.   Eyes:  Negative for pain, discharge and itching.  Respiratory:  Negative for cough, chest tightness and shortness of breath.   Cardiovascular:  Negative for chest  pain and palpitations.  Gastrointestinal:  Negative for abdominal distention, abdominal pain, constipation and diarrhea.  Endocrine: Negative for cold intolerance and heat intolerance.  Genitourinary:  Negative for difficulty urinating, dysuria, flank pain and hematuria.  Musculoskeletal:  Negative for arthralgias, gait problem and joint swelling.  Skin:   Negative for color change.  Neurological:  Negative for dizziness, syncope and headaches.  Hematological:  Negative for adenopathy.  Psychiatric/Behavioral:  Negative for agitation, behavioral problems and confusion.     Assessment Physical Exam Constitutional:      Appearance: Normal appearance.  HENT:     Head: Normocephalic and atraumatic.     Nose: Nose normal.     Mouth/Throat:     Mouth: Mucous membranes are moist.     Pharynx: Oropharynx is clear.  Eyes:     Conjunctiva/sclera: Conjunctivae normal.     Pupils: Pupils are equal, round, and reactive to light.  Cardiovascular:     Rate and Rhythm: Normal rate and regular rhythm.     Pulses: Normal pulses.     Heart sounds: Normal heart sounds.  Pulmonary:     Effort: Pulmonary effort is normal.     Breath sounds: Normal breath sounds.  Abdominal:     General: There is no distension.     Palpations: Abdomen is soft.     Tenderness: There is no abdominal tenderness.  Musculoskeletal:        General: No swelling, tenderness or deformity. Normal range of motion.     Cervical back: Normal range of motion and neck supple.  Skin:    General: Skin is warm.  Neurological:     General: No focal deficit present.     Mental Status: She is alert and oriented to person, place, and time.  Psychiatric:        Mood and Affect: Mood normal.        Plan 1. Malignant neoplasm metastatic to bone    (CMD) (Primary)  2. Multiple myeloma in remission    (CMD) - Ambulatory referral to Hematology / Oncology; Future  3. Colon cancer screening - Ambulatory referral to Gastroenterology; Future  4. Swelling of left upper eyelid - Ambulatory referral to Ophthalmology; Future  5. Primary insomnia - zolpidem  (AMBIEN ) 10 mg tablet; Take 1 tablet (10 mg total) by mouth nightly as needed for sleep.  Dispense: 10 tablet; Refill: 0    Assessment & Plan 1. Multiple myeloma: - The condition is currently in remission. She reports a swelling  on the same side as her previous intervention, which may be related to her past treatment. - An MRI of the brain conducted in 09/2023 showed no acute intracranial pathology, stable post-frontal craniotomy, chronic and mild focal encephalomalacia in the left frontal lobe with cortical gliosis. - A referral to an oncologist will be made for further evaluation and monitoring. - An appointment with an eye doctor is recommended to investigate the swelling.  2. Constipation: - She likely has slow transit constipation, possibly due to dehydration or inadequate fiber intake. - Recent blood work in 09/2023 was within normal limits. - She is advised to take MiraLAX , one capful once or twice daily, until regular bowel movements are achieved, after which it can be taken as needed.  3. Cervical cancer screening: - She had an abnormal Pap smear in 04/2023, which led to a colposcopy and a diagnosis of CIN 1. - She is advised to maintain regular follow-ups with her gynecologist for ongoing cervical cancer screening.  4.  Health maintenance: - Her last physical examination was conducted in 09/2023. - A referral to a gastroenterologist will be made for a colonoscopy to screen for colon cancer.  Follow-up: The patient will follow up in 6 months for a physical examination or sooner if any health concerns arise.  Follow up care instructions were provided and reviewed with Patient.   All questions were answered. Patient verbalized understanding of plan of care today.  These notes are made using AI assist via [employer] and voice recognition software to aid with clinical documentation and is prone to unintentional errors. I have done my best to review and correct errors. Please contact me personally if there are any specific concerns or questions about the content.   Electronically signed: Waunita DELENA Deidra Licia, MD 01/22/2024  2:16 PM

## 2024-04-04 ENCOUNTER — Ambulatory Visit (HOSPITAL_COMMUNITY)

## 2024-04-04 ENCOUNTER — Inpatient Hospital Stay

## 2024-04-06 ENCOUNTER — Ambulatory Visit: Admitting: Urology

## 2024-04-11 ENCOUNTER — Inpatient Hospital Stay

## 2024-04-13 ENCOUNTER — Ambulatory Visit: Admitting: Urology

## 2024-04-18 ENCOUNTER — Encounter: Payer: Self-pay | Admitting: Nurse Practitioner

## 2024-04-29 ENCOUNTER — Inpatient Hospital Stay

## 2024-04-29 ENCOUNTER — Inpatient Hospital Stay: Admitting: Hematology
